# Patient Record
Sex: Female | Born: 1955
Health system: Southern US, Community
[De-identification: ages and names within clinical notes are randomized; demographics above are authoritative.]

## PROBLEM LIST (undated history)

## (undated) DIAGNOSIS — R569 Unspecified convulsions: Secondary | ICD-10-CM

## (undated) DIAGNOSIS — I214 Non-ST elevation (NSTEMI) myocardial infarction: Secondary | ICD-10-CM

## (undated) DIAGNOSIS — E7800 Pure hypercholesterolemia, unspecified: Secondary | ICD-10-CM

## (undated) DIAGNOSIS — I340 Nonrheumatic mitral (valve) insufficiency: Secondary | ICD-10-CM

## (undated) DIAGNOSIS — J449 Chronic obstructive pulmonary disease, unspecified: Secondary | ICD-10-CM

## (undated) DIAGNOSIS — F32A Depression, unspecified: Secondary | ICD-10-CM

## (undated) DIAGNOSIS — I5189 Other ill-defined heart diseases: Secondary | ICD-10-CM

## (undated) DIAGNOSIS — I639 Cerebral infarction, unspecified: Secondary | ICD-10-CM

## (undated) DIAGNOSIS — I5042 Chronic combined systolic (congestive) and diastolic (congestive) heart failure: Secondary | ICD-10-CM

## (undated) DIAGNOSIS — F329 Major depressive disorder, single episode, unspecified: Secondary | ICD-10-CM

## (undated) DIAGNOSIS — I071 Rheumatic tricuspid insufficiency: Secondary | ICD-10-CM

## (undated) DIAGNOSIS — I480 Paroxysmal atrial fibrillation: Secondary | ICD-10-CM

## (undated) DIAGNOSIS — J9611 Chronic respiratory failure with hypoxia: Secondary | ICD-10-CM

## (undated) DIAGNOSIS — I10 Essential (primary) hypertension: Secondary | ICD-10-CM

## (undated) DIAGNOSIS — Z972 Presence of dental prosthetic device (complete) (partial): Secondary | ICD-10-CM

## (undated) DIAGNOSIS — G4730 Sleep apnea, unspecified: Secondary | ICD-10-CM

## (undated) DIAGNOSIS — H401190 Primary open-angle glaucoma, unspecified eye, stage unspecified: Secondary | ICD-10-CM

## (undated) DIAGNOSIS — I428 Other cardiomyopathies: Secondary | ICD-10-CM

## (undated) DIAGNOSIS — E119 Type 2 diabetes mellitus without complications: Secondary | ICD-10-CM

## (undated) HISTORY — DX: Chronic combined systolic (congestive) and diastolic (congestive) heart failure: I50.42

## (undated) HISTORY — PX: ABDOMINAL HYSTERECTOMY: SHX81

## (undated) HISTORY — DX: Other cardiomyopathies: I42.8

## (undated) HISTORY — DX: Rheumatic tricuspid insufficiency: I07.1

## (undated) HISTORY — DX: Major depressive disorder, single episode, unspecified: F32.9

## (undated) HISTORY — DX: Depression, unspecified: F32.A

## (undated) HISTORY — DX: Cerebral infarction, unspecified: I63.9

## (undated) HISTORY — DX: Non-ST elevation (NSTEMI) myocardial infarction: I21.4

## (undated) HISTORY — DX: Morbid (severe) obesity due to excess calories: E66.01

## (undated) HISTORY — PX: REPLACEMENT TOTAL KNEE: SUR1224

---

## 2015-01-22 DIAGNOSIS — M159 Polyosteoarthritis, unspecified: Secondary | ICD-10-CM | POA: Insufficient documentation

## 2015-01-22 DIAGNOSIS — E1169 Type 2 diabetes mellitus with other specified complication: Secondary | ICD-10-CM | POA: Insufficient documentation

## 2015-01-22 DIAGNOSIS — M150 Primary generalized (osteo)arthritis: Secondary | ICD-10-CM | POA: Insufficient documentation

## 2015-01-22 DIAGNOSIS — G40309 Generalized idiopathic epilepsy and epileptic syndromes, not intractable, without status epilepticus: Secondary | ICD-10-CM | POA: Insufficient documentation

## 2015-01-22 DIAGNOSIS — E785 Hyperlipidemia, unspecified: Secondary | ICD-10-CM

## 2015-01-22 DIAGNOSIS — E1159 Type 2 diabetes mellitus with other circulatory complications: Secondary | ICD-10-CM | POA: Insufficient documentation

## 2015-01-22 DIAGNOSIS — H9193 Unspecified hearing loss, bilateral: Secondary | ICD-10-CM | POA: Insufficient documentation

## 2015-01-22 DIAGNOSIS — F332 Major depressive disorder, recurrent severe without psychotic features: Secondary | ICD-10-CM | POA: Insufficient documentation

## 2015-01-22 DIAGNOSIS — E119 Type 2 diabetes mellitus without complications: Secondary | ICD-10-CM | POA: Insufficient documentation

## 2015-01-22 DIAGNOSIS — I152 Hypertension secondary to endocrine disorders: Secondary | ICD-10-CM | POA: Insufficient documentation

## 2015-01-22 DIAGNOSIS — I10 Essential (primary) hypertension: Secondary | ICD-10-CM

## 2015-03-12 DIAGNOSIS — E669 Obesity, unspecified: Secondary | ICD-10-CM | POA: Insufficient documentation

## 2015-05-30 DIAGNOSIS — G4733 Obstructive sleep apnea (adult) (pediatric): Secondary | ICD-10-CM | POA: Insufficient documentation

## 2015-05-30 DIAGNOSIS — Z9989 Dependence on other enabling machines and devices: Secondary | ICD-10-CM

## 2015-05-30 DIAGNOSIS — R0902 Hypoxemia: Secondary | ICD-10-CM | POA: Insufficient documentation

## 2015-05-30 DIAGNOSIS — J449 Chronic obstructive pulmonary disease, unspecified: Secondary | ICD-10-CM | POA: Insufficient documentation

## 2015-06-09 DIAGNOSIS — H532 Diplopia: Secondary | ICD-10-CM | POA: Insufficient documentation

## 2015-06-26 DIAGNOSIS — I639 Cerebral infarction, unspecified: Secondary | ICD-10-CM | POA: Insufficient documentation

## 2015-10-17 DIAGNOSIS — I480 Paroxysmal atrial fibrillation: Secondary | ICD-10-CM | POA: Insufficient documentation

## 2016-01-05 DIAGNOSIS — R911 Solitary pulmonary nodule: Secondary | ICD-10-CM | POA: Insufficient documentation

## 2016-03-12 DIAGNOSIS — Z87898 Personal history of other specified conditions: Secondary | ICD-10-CM | POA: Insufficient documentation

## 2016-03-12 DIAGNOSIS — Z8673 Personal history of transient ischemic attack (TIA), and cerebral infarction without residual deficits: Secondary | ICD-10-CM | POA: Insufficient documentation

## 2016-03-12 DIAGNOSIS — J441 Chronic obstructive pulmonary disease with (acute) exacerbation: Secondary | ICD-10-CM | POA: Insufficient documentation

## 2016-07-29 ENCOUNTER — Encounter: Payer: Self-pay | Admitting: Emergency Medicine

## 2016-07-29 ENCOUNTER — Emergency Department
Admission: EM | Admit: 2016-07-29 | Discharge: 2016-07-29 | Disposition: A | Discharge status: Home / Self Care | Payer: Medicare HMO | Attending: Emergency Medicine | Admitting: Emergency Medicine

## 2016-07-29 DIAGNOSIS — M545 Low back pain, unspecified: Secondary | ICD-10-CM

## 2016-07-29 DIAGNOSIS — Z79899 Other long term (current) drug therapy: Secondary | ICD-10-CM | POA: Insufficient documentation

## 2016-07-29 DIAGNOSIS — E119 Type 2 diabetes mellitus without complications: Secondary | ICD-10-CM | POA: Insufficient documentation

## 2016-07-29 DIAGNOSIS — I10 Essential (primary) hypertension: Secondary | ICD-10-CM | POA: Insufficient documentation

## 2016-07-29 DIAGNOSIS — Z7901 Long term (current) use of anticoagulants: Secondary | ICD-10-CM | POA: Insufficient documentation

## 2016-07-29 DIAGNOSIS — Z7982 Long term (current) use of aspirin: Secondary | ICD-10-CM | POA: Insufficient documentation

## 2016-07-29 DIAGNOSIS — Z7984 Long term (current) use of oral hypoglycemic drugs: Secondary | ICD-10-CM | POA: Diagnosis not present

## 2016-07-29 HISTORY — DX: Essential (primary) hypertension: I10

## 2016-07-29 HISTORY — DX: Pure hypercholesterolemia, unspecified: E78.00

## 2016-07-29 HISTORY — DX: Type 2 diabetes mellitus without complications: E11.9

## 2016-07-29 HISTORY — DX: Unspecified convulsions: R56.9

## 2016-07-29 LAB — URINALYSIS, COMPLETE (UACMP) WITH MICROSCOPIC
BACTERIA UA: NONE SEEN
BILIRUBIN URINE: NEGATIVE
Glucose, UA: NEGATIVE mg/dL
Hgb urine dipstick: NEGATIVE
KETONES UR: NEGATIVE mg/dL
LEUKOCYTES UA: NEGATIVE
Nitrite: NEGATIVE
PH: 6 (ref 5.0–8.0)
PROTEIN: NEGATIVE mg/dL
Specific Gravity, Urine: 1.019 (ref 1.005–1.030)

## 2016-07-29 MED ORDER — DIAZEPAM 2 MG PO TABS: 2.0000 mg | ORAL_TABLET | Freq: Three times a day (TID) | ORAL | 0 refills | Status: DC | PRN

## 2016-07-29 MED ORDER — DIAZEPAM 5 MG PO TABS
5.0000 mg | ORAL_TABLET | Freq: Once | ORAL | Status: AC
  Administered 2016-07-29: 5 mg via ORAL
  Filled 2016-07-29: qty 1

## 2016-07-29 MED ORDER — HYDROCODONE-ACETAMINOPHEN 5-325 MG PO TABS
1.0000 | ORAL_TABLET | Freq: Once | ORAL | Status: AC
  Administered 2016-07-29: 1 via ORAL
  Filled 2016-07-29: qty 1

## 2016-07-29 MED ORDER — HYDROCODONE-ACETAMINOPHEN 5-325 MG PO TABS: 1.0000 | ORAL_TABLET | ORAL | 0 refills | Status: DC | PRN

## 2016-07-29 NOTE — ED Triage Notes (Signed)
Pain low right back sharp for 2 days.  Worse with movement.

## 2016-07-29 NOTE — Discharge Instructions (Signed)
Continue diazepam 2 mg 1 every 8 hours as needed for muscle spasms. Norco one every 4 hours as needed for moderate pain. Moist heat or ice to your back as needed for comfort. Make an appointment with your primary care doctor in [facility] if any continued problems.

## 2016-07-29 NOTE — ED Provider Notes (Signed)
[facility] Emergency Department Provider Note   ____________________________________________   First MD Initiated Contact with Patient 07/29/16 1200     (approximate)  I have reviewed the triage vital signs and the nursing notes.   HISTORY  Chief Complaint Back Pain    HPI Brenda Wu is a 60 y.o. female is her complaint of sharp right lower back pain for the last 2 days. Patient states that pain is worse with movement. She is unaware of any actual injury. Patient denies any urinary systems or history of kidney stone. She states nothing she has done has helped with her pain. She denies any paresthesias into her lower extremities. Patient rates her pain as a 10 over 10.   Past Medical History:  Diagnosis Date  . Diabetes mellitus without complication (HCC)   . Hypercholesteremia   . Hypertension   . Seizures (HCC)     There are no active problems to display for this patient.   History reviewed. No pertinent surgical history.  Prior to Admission medications   Medication Sig Start Date End Date Taking? Authorizing Provider  aspirin 325 MG EC tablet Take 325 mg by mouth daily.   Yes [provider]  atorvastatin (LIPITOR) 40 MG tablet Take 40 mg by mouth daily.   Yes [provider]  diltiazem (CARDIZEM SR) 60 MG 12 hr capsule Take 60 mg by mouth 2 (two) times daily.   Yes [provider]  glipiZIDE (GLUCOTROL) 5 MG tablet Take by mouth daily before breakfast.   Yes [provider]  hydrochlorothiazide (HYDRODIURIL) 25 MG tablet Take 25 mg by mouth daily.   Yes [provider]  lamoTRIgine (LAMICTAL) 200 MG tablet Take 200 mg by mouth daily.   Yes [provider]  losartan (COZAAR) 50 MG tablet Take 50 mg by mouth daily.   Yes [provider]  metFORMIN (GLUCOPHAGE) 1000 MG tablet Take 1,000 mg by mouth 2 (two) times daily with a meal.   Yes [provider]  rivaroxaban  (XARELTO) 20 MG TABS tablet Take 20 mg by mouth daily with supper.   Yes [provider]  sitaGLIPtin (JANUVIA) 50 MG tablet Take 50 mg by mouth daily.   Yes [provider]  tiotropium (SPIRIVA) 18 MCG inhalation capsule Place 18 mcg into inhaler and inhale daily.   Yes [provider]  topiramate (TOPAMAX) 200 MG tablet Take 200 mg by mouth 2 (two) times daily.   Yes [provider]  topiramate (TOPAMAX) 50 MG tablet Take 50 mg by mouth 2 (two) times daily.   Yes [provider]  diazepam (VALIUM) 2 MG tablet Take 1 tablet (2 mg total) by mouth every 8 (eight) hours as needed for muscle spasms. 07/29/16   Tommi Rumps, PA-C  HYDROcodone-acetaminophen (NORCO/VICODIN) 5-325 MG tablet Take 1 tablet by mouth every 4 (four) hours as needed for moderate pain. 07/29/16   Tommi Rumps, PA-C    Allergies Patient has no known allergies.  No family history on file.  Social History Social History  Substance Use Topics  . Smoking status: Never Smoker  . Smokeless tobacco: Never Used  . Alcohol use No    Review of Systems  Constitutional: No fever/chills Cardiovascular: Denies chest pain. Respiratory: Denies shortness of breath. Gastrointestinal: No abdominal pain.  No nausea, no vomiting. Genitourinary: Negative for dysuria. Musculoskeletal: Positive for right-sided low back pain. Skin: Negative for rash. Neurological: Negative for headaches, focal weakness or numbness.  ____________________________________________   PHYSICAL EXAM:  VITAL SIGNS: ED Triage Vitals  Enc Vitals Group     BP 07/29/16 1132 (!) 141/63     Pulse Rate 07/29/16 1132 95     Resp 07/29/16 1132 20     Temp 07/29/16 1132 98 F (36.7 C)     Temp Source 07/29/16 1132 Oral     SpO2 07/29/16 1132 95 %     Weight 07/29/16 1135 234 lb (106.1 kg)     Height 07/29/16 1135 5\' 3"  (1.6 m)     Head Circumference --      Peak Flow --      Pain Score 07/29/16 1138 10       Pain Loc --      Pain Edu? --      Excl. in GC? --     Constitutional: Alert and oriented. Well appearing and in no acute distress. Eyes: Conjunctivae are normal. PERRL. EOMI. Head: Atraumatic. Neck: No stridor.   Cardiovascular: Normal rate, regular rhythm. Grossly normal heart sounds.  Good peripheral circulation. Respiratory: Normal respiratory effort.  No retractions. Lungs CTAB. Gastrointestinal: Soft and nontender. No distention. Bowel sounds normoactive 4 quadrants. Musculoskeletal: On examination of the back there is no gross deformity noted. There is tenderness on palpation of the right lower lateral muscles. Range of motion is restricted secondary to patient's discomfort. There is no point tenderness on palpation of the lumbar spine. No CVA tenderness is appreciated. Neurologic:  Normal speech and language. No gross focal neurologic deficits are appreciated. Skin:  Skin is warm, dry and intact. No rash noted. Psychiatric: Mood and affect are normal. Speech and behavior are normal.  ____________________________________________   LABS (all labs ordered are listed, but only abnormal results are displayed)  Labs Reviewed  URINALYSIS, COMPLETE (UACMP) WITH MICROSCOPIC - Abnormal; Notable for the following:       Result Value   Color, Urine YELLOW (*)    APPearance CLEAR (*)    Squamous Epithelial / LPF 6-30 (*)    All other components within normal limits    RADIOLOGY  No results found.  ____________________________________________   PROCEDURES  Procedure(s) performed: None  Procedures  Critical Care performed: No  ____________________________________________   INITIAL IMPRESSION / ASSESSMENT AND PLAN / ED COURSE  Pertinent labs & imaging results that were available during my care of the patient were reviewed by me and considered in my medical decision making (see chart for details).  ----------------------------------------- 1:12 PM on  07/29/2016 ----------------------------------------- Patient has improved on medication given in the ED which included Valium 5 mg by mouth and Norco. Patient was reassured that urinalysis was normal. She is also reassured that in the area that she is hurting is muscle skeletal and that is the muscle spasms that is increasing her pain with movement. Patient was improved prior to discharge and at the time of discharge her pain was down to a 6/10.      ____________________________________________   FINAL CLINICAL IMPRESSION(S) / ED DIAGNOSES  Final diagnoses:  Acute right-sided low back pain without sciatica      NEW MEDICATIONS STARTED DURING THIS VISIT:  Discharge Medication List as of 07/29/2016  1:16 PM    START taking these medications   Details  diazepam (VALIUM) 2 MG tablet Take 1 tablet (2 mg total) by mouth every 8 (eight) hours as needed for muscle spasms., Starting Thu 07/29/2016, Print    HYDROcodone-acetaminophen (NORCO/VICODIN) 5-325 MG tablet Take 1 tablet by mouth every  4 (four) hours as needed for moderate pain., Starting Thu 07/29/2016, Print         Note:  This document was prepared using Dragon voice recognition software and may include unintentional dictation errors.    Tommi Rumps, PA-C 07/29/16 1551    Loleta Rose, MD 08/01/16 [phone]

## 2016-11-01 ENCOUNTER — Emergency Department: Payer: Medicare HMO

## 2016-11-01 ENCOUNTER — Inpatient Hospital Stay
Admission: EM | Admit: 2016-11-01 | Discharge: 2016-11-04 | DRG: 190 | Disposition: A | Discharge status: Home / Self Care | Payer: Medicare HMO | Attending: Specialist | Admitting: Specialist

## 2016-11-01 ENCOUNTER — Encounter: Payer: Self-pay | Admitting: *Deleted

## 2016-11-01 DIAGNOSIS — I10 Essential (primary) hypertension: Secondary | ICD-10-CM | POA: Diagnosis present

## 2016-11-01 DIAGNOSIS — N179 Acute kidney failure, unspecified: Secondary | ICD-10-CM | POA: Diagnosis present

## 2016-11-01 DIAGNOSIS — Z87891 Personal history of nicotine dependence: Secondary | ICD-10-CM

## 2016-11-01 DIAGNOSIS — J441 Chronic obstructive pulmonary disease with (acute) exacerbation: Principal | ICD-10-CM | POA: Diagnosis present

## 2016-11-01 DIAGNOSIS — E119 Type 2 diabetes mellitus without complications: Secondary | ICD-10-CM | POA: Diagnosis present

## 2016-11-01 DIAGNOSIS — E7800 Pure hypercholesterolemia, unspecified: Secondary | ICD-10-CM | POA: Diagnosis present

## 2016-11-01 DIAGNOSIS — K219 Gastro-esophageal reflux disease without esophagitis: Secondary | ICD-10-CM | POA: Diagnosis present

## 2016-11-01 DIAGNOSIS — J9602 Acute respiratory failure with hypercapnia: Secondary | ICD-10-CM

## 2016-11-01 DIAGNOSIS — R0600 Dyspnea, unspecified: Secondary | ICD-10-CM

## 2016-11-01 DIAGNOSIS — J440 Chronic obstructive pulmonary disease with acute lower respiratory infection: Secondary | ICD-10-CM | POA: Diagnosis present

## 2016-11-01 DIAGNOSIS — R569 Unspecified convulsions: Secondary | ICD-10-CM | POA: Diagnosis present

## 2016-11-01 DIAGNOSIS — Z7984 Long term (current) use of oral hypoglycemic drugs: Secondary | ICD-10-CM | POA: Diagnosis not present

## 2016-11-01 DIAGNOSIS — J9621 Acute and chronic respiratory failure with hypoxia: Secondary | ICD-10-CM | POA: Diagnosis present

## 2016-11-01 DIAGNOSIS — E785 Hyperlipidemia, unspecified: Secondary | ICD-10-CM | POA: Diagnosis present

## 2016-11-01 DIAGNOSIS — J9622 Acute and chronic respiratory failure with hypercapnia: Secondary | ICD-10-CM | POA: Diagnosis present

## 2016-11-01 DIAGNOSIS — E872 Acidosis: Secondary | ICD-10-CM | POA: Diagnosis present

## 2016-11-01 DIAGNOSIS — J189 Pneumonia, unspecified organism: Secondary | ICD-10-CM

## 2016-11-01 DIAGNOSIS — J209 Acute bronchitis, unspecified: Secondary | ICD-10-CM | POA: Diagnosis present

## 2016-11-01 DIAGNOSIS — Z9981 Dependence on supplemental oxygen: Secondary | ICD-10-CM | POA: Diagnosis not present

## 2016-11-01 LAB — BASIC METABOLIC PANEL
ANION GAP: 7 (ref 5–15)
BUN: 15 mg/dL (ref 6–20)
CHLORIDE: 104 mmol/L (ref 101–111)
CO2: 31 mmol/L (ref 22–32)
Calcium: 9.5 mg/dL (ref 8.9–10.3)
Creatinine, Ser: 1.16 mg/dL — ABNORMAL HIGH (ref 0.44–1.00)
GFR calc non Af Amer: 50 mL/min — ABNORMAL LOW (ref >60)
GFR, EST AFRICAN AMERICAN: 58 mL/min — AB (ref >60)
Glucose, Bld: 175 mg/dL — ABNORMAL HIGH (ref 65–99)
Potassium: 3.7 mmol/L (ref 3.5–5.1)
SODIUM: 142 mmol/L (ref 135–145)

## 2016-11-01 LAB — CBC
HCT: 52.6 % — ABNORMAL HIGH (ref 35.0–47.0)
HEMOGLOBIN: 16.7 g/dL — AB (ref 12.0–16.0)
MCH: 29.1 pg (ref 26.0–34.0)
MCHC: 31.7 g/dL — ABNORMAL LOW (ref 32.0–36.0)
MCV: 91.7 fL (ref 80.0–100.0)
Platelets: 170 10*3/uL (ref 150–440)
RBC: 5.74 MIL/uL — AB (ref 3.80–5.20)
RDW: 14.8 % — ABNORMAL HIGH (ref 11.5–14.5)
WBC: 8.4 10*3/uL (ref 3.6–11.0)

## 2016-11-01 LAB — GLUCOSE, CAPILLARY
GLUCOSE-CAPILLARY: 320 mg/dL — AB (ref 65–99)
Glucose-Capillary: 202 mg/dL — ABNORMAL HIGH (ref 65–99)
Glucose-Capillary: 347 mg/dL — ABNORMAL HIGH (ref 65–99)

## 2016-11-01 LAB — BLOOD GAS, VENOUS
Acid-Base Excess: 4 mmol/L — ABNORMAL HIGH (ref 0.0–2.0)
BICARBONATE: 35.6 mmol/L — AB (ref 20.0–28.0)
O2 SAT: 46.5 %
PCO2 VEN: 85 mmHg — AB (ref 44.0–60.0)
PO2 VEN: 31 mmHg — AB (ref 32.0–45.0)
Patient temperature: 37
pH, Ven: 7.23 — ABNORMAL LOW (ref 7.250–7.430)

## 2016-11-01 LAB — MRSA PCR SCREENING: MRSA by PCR: NEGATIVE

## 2016-11-01 LAB — PROCALCITONIN

## 2016-11-01 LAB — TROPONIN I

## 2016-11-01 MED ORDER — SODIUM CHLORIDE 0.9% FLUSH
3.0000 mL | Freq: Two times a day (BID) | INTRAVENOUS | Status: DC
  Administered 2016-11-01 – 2016-11-03 (×4): 3 mL via INTRAVENOUS

## 2016-11-01 MED ORDER — LAMOTRIGINE 25 MG PO TABS
200.0000 mg | ORAL_TABLET | Freq: Two times a day (BID) | ORAL | Status: DC
  Administered 2016-11-01 – 2016-11-04 (×6): 200 mg via ORAL
  Filled 2016-11-01 (×6): qty 8

## 2016-11-01 MED ORDER — INSULIN ASPART 100 UNIT/ML ~~LOC~~ SOLN
0.0000 [IU] | Freq: Three times a day (TID) | SUBCUTANEOUS | Status: DC
  Administered 2016-11-02 (×2): 4 [IU] via SUBCUTANEOUS
  Administered 2016-11-03: 15 [IU] via SUBCUTANEOUS
  Administered 2016-11-03 – 2016-11-04 (×2): 4 [IU] via SUBCUTANEOUS
  Filled 2016-11-01 (×5): qty 1

## 2016-11-01 MED ORDER — METHYLPREDNISOLONE SODIUM SUCC 125 MG IJ SOLR
125.0000 mg | Freq: Once | INTRAMUSCULAR | Status: AC
  Administered 2016-11-01: 125 mg via INTRAVENOUS
  Filled 2016-11-01: qty 2

## 2016-11-01 MED ORDER — SODIUM CHLORIDE 0.9% FLUSH: 3.0000 mL | INTRAVENOUS | Status: DC | PRN

## 2016-11-01 MED ORDER — DEXTROSE 5 % IV SOLN
1.0000 g | INTRAVENOUS | Status: DC
  Filled 2016-11-01: qty 10

## 2016-11-01 MED ORDER — NIACIN ER 500 MG PO CPCR
500.0000 mg | ORAL_CAPSULE | Freq: Every day | ORAL | Status: DC
  Administered 2016-11-02 – 2016-11-04 (×3): 500 mg via ORAL
  Filled 2016-11-01 (×3): qty 1

## 2016-11-01 MED ORDER — INSULIN ASPART 100 UNIT/ML ~~LOC~~ SOLN
0.0000 [IU] | Freq: Every day | SUBCUTANEOUS | Status: DC
  Administered 2016-11-01: 4 [IU] via SUBCUTANEOUS
  Administered 2016-11-02: 2 [IU] via SUBCUTANEOUS
  Filled 2016-11-01 (×2): qty 1

## 2016-11-01 MED ORDER — ACETAMINOPHEN 650 MG RE SUPP: 650.0000 mg | Freq: Four times a day (QID) | RECTAL | Status: DC | PRN

## 2016-11-01 MED ORDER — ACETAMINOPHEN 325 MG PO TABS
650.0000 mg | ORAL_TABLET | Freq: Four times a day (QID) | ORAL | Status: DC | PRN
  Administered 2016-11-01 – 2016-11-03 (×3): 650 mg via ORAL
  Filled 2016-11-01 (×3): qty 2

## 2016-11-01 MED ORDER — ALBUTEROL SULFATE (2.5 MG/3ML) 0.083% IN NEBU
5.0000 mg | INHALATION_SOLUTION | Freq: Once | RESPIRATORY_TRACT | Status: AC
  Administered 2016-11-01: 5 mg via RESPIRATORY_TRACT
  Filled 2016-11-01: qty 6

## 2016-11-01 MED ORDER — ADULT MULTIVITAMIN W/MINERALS CH
1.0000 | ORAL_TABLET | Freq: Every day | ORAL | Status: DC
  Administered 2016-11-02 – 2016-11-04 (×3): 1 via ORAL
  Filled 2016-11-01 (×5): qty 1

## 2016-11-01 MED ORDER — TOPIRAMATE 100 MG PO TABS
200.0000 mg | ORAL_TABLET | Freq: Two times a day (BID) | ORAL | Status: DC
  Administered 2016-11-01 – 2016-11-04 (×6): 200 mg via ORAL
  Filled 2016-11-01 (×6): qty 2

## 2016-11-01 MED ORDER — METHYLPREDNISOLONE SODIUM SUCC 125 MG IJ SOLR
80.0000 mg | Freq: Four times a day (QID) | INTRAMUSCULAR | Status: DC
  Administered 2016-11-01: 80 mg via INTRAVENOUS
  Filled 2016-11-01: qty 2

## 2016-11-01 MED ORDER — BUDESONIDE 0.25 MG/2ML IN SUSP
0.2500 mg | Freq: Two times a day (BID) | RESPIRATORY_TRACT | Status: DC
  Administered 2016-11-01 – 2016-11-02 (×2): 0.25 mg via RESPIRATORY_TRACT
  Filled 2016-11-01 (×2): qty 2

## 2016-11-01 MED ORDER — POTASSIUM 99 MG PO TABS: 99.0000 mg | ORAL_TABLET | Freq: Every day | ORAL | Status: DC

## 2016-11-01 MED ORDER — SODIUM CHLORIDE 0.9 % IV BOLUS (SEPSIS)
1000.0000 mL | Freq: Once | INTRAVENOUS | Status: AC
  Administered 2016-11-01: 1000 mL via INTRAVENOUS

## 2016-11-01 MED ORDER — AZITHROMYCIN 500 MG IV SOLR
500.0000 mg | INTRAVENOUS | Status: DC
  Filled 2016-11-01: qty 500

## 2016-11-01 MED ORDER — LINAGLIPTIN 5 MG PO TABS
5.0000 mg | ORAL_TABLET | Freq: Every day | ORAL | Status: DC
  Administered 2016-11-02 – 2016-11-04 (×3): 5 mg via ORAL
  Filled 2016-11-01 (×3): qty 1

## 2016-11-01 MED ORDER — PANTOPRAZOLE SODIUM 40 MG IV SOLR
40.0000 mg | INTRAVENOUS | Status: DC
  Administered 2016-11-01: 40 mg via INTRAVENOUS
  Filled 2016-11-01: qty 40

## 2016-11-01 MED ORDER — ONDANSETRON HCL 4 MG PO TABS: 4.0000 mg | ORAL_TABLET | Freq: Four times a day (QID) | ORAL | Status: DC | PRN

## 2016-11-01 MED ORDER — DEXTROSE 5 % IV SOLN
500.0000 mg | Freq: Once | INTRAVENOUS | Status: AC
  Administered 2016-11-01: 500 mg via INTRAVENOUS
  Filled 2016-11-01: qty 500

## 2016-11-01 MED ORDER — ATORVASTATIN CALCIUM 20 MG PO TABS
40.0000 mg | ORAL_TABLET | Freq: Every day | ORAL | Status: DC
  Administered 2016-11-01 – 2016-11-04 (×4): 40 mg via ORAL
  Filled 2016-11-01 (×4): qty 2

## 2016-11-01 MED ORDER — ENOXAPARIN SODIUM 40 MG/0.4ML ~~LOC~~ SOLN
40.0000 mg | SUBCUTANEOUS | Status: DC
  Administered 2016-11-01 – 2016-11-03 (×3): 40 mg via SUBCUTANEOUS
  Filled 2016-11-01 (×3): qty 0.4

## 2016-11-01 MED ORDER — GLIPIZIDE 5 MG PO TABS
5.0000 mg | ORAL_TABLET | Freq: Two times a day (BID) | ORAL | Status: DC
  Administered 2016-11-02 – 2016-11-04 (×5): 5 mg via ORAL
  Filled 2016-11-01 (×6): qty 1

## 2016-11-01 MED ORDER — CEFTRIAXONE SODIUM 1 G IJ SOLR
1.0000 g | Freq: Once | INTRAMUSCULAR | Status: AC
  Administered 2016-11-01: 1 g via INTRAVENOUS
  Filled 2016-11-01: qty 10

## 2016-11-01 MED ORDER — SODIUM CHLORIDE 0.9 % IV SOLN: 250.0000 mL | INTRAVENOUS | Status: DC | PRN

## 2016-11-01 MED ORDER — ONDANSETRON HCL 4 MG/2ML IJ SOLN: 4.0000 mg | Freq: Four times a day (QID) | INTRAMUSCULAR | Status: DC | PRN

## 2016-11-01 MED ORDER — METHYLPREDNISOLONE SODIUM SUCC 40 MG IJ SOLR
40.0000 mg | Freq: Two times a day (BID) | INTRAMUSCULAR | Status: AC
  Administered 2016-11-02 – 2016-11-03 (×4): 40 mg via INTRAVENOUS
  Filled 2016-11-01 (×4): qty 1

## 2016-11-01 MED ORDER — BUDESONIDE 0.25 MG/2ML IN SUSP: 0.2500 mg | Freq: Two times a day (BID) | RESPIRATORY_TRACT | Status: DC

## 2016-11-01 MED ORDER — FERROUS SULFATE 325 (65 FE) MG PO TABS
325.0000 mg | ORAL_TABLET | Freq: Every day | ORAL | Status: DC
  Administered 2016-11-02 – 2016-11-04 (×3): 325 mg via ORAL
  Filled 2016-11-01 (×3): qty 1

## 2016-11-01 MED ORDER — IPRATROPIUM-ALBUTEROL 0.5-2.5 (3) MG/3ML IN SOLN
3.0000 mL | Freq: Four times a day (QID) | RESPIRATORY_TRACT | Status: DC
  Administered 2016-11-01 – 2016-11-04 (×11): 3 mL via RESPIRATORY_TRACT
  Filled 2016-11-01 (×11): qty 3

## 2016-11-01 MED ORDER — DILTIAZEM HCL ER 60 MG PO CP12
60.0000 mg | ORAL_CAPSULE | Freq: Every day | ORAL | Status: DC
  Administered 2016-11-02 – 2016-11-04 (×3): 60 mg via ORAL
  Filled 2016-11-01 (×3): qty 1

## 2016-11-01 MED ORDER — IPRATROPIUM-ALBUTEROL 0.5-2.5 (3) MG/3ML IN SOLN
3.0000 mL | Freq: Once | RESPIRATORY_TRACT | Status: AC
  Administered 2016-11-01: 3 mL via RESPIRATORY_TRACT
  Filled 2016-11-01: qty 3

## 2016-11-01 MED ORDER — LOSARTAN POTASSIUM 50 MG PO TABS
50.0000 mg | ORAL_TABLET | Freq: Every day | ORAL | Status: DC
  Administered 2016-11-02 – 2016-11-04 (×3): 50 mg via ORAL
  Filled 2016-11-01 (×3): qty 1

## 2016-11-01 MED ORDER — HYDROCHLOROTHIAZIDE 25 MG PO TABS
25.0000 mg | ORAL_TABLET | Freq: Every day | ORAL | Status: DC
  Administered 2016-11-02 – 2016-11-04 (×3): 25 mg via ORAL
  Filled 2016-11-01 (×3): qty 1

## 2016-11-01 NOTE — ED Notes (Signed)
Pt returned from xray via stretcher.

## 2016-11-01 NOTE — ED Notes (Signed)
Pt resting in bed with eyes closed, lights dimmed for comfort. Pt remains on BiPap at this time. Will continue to monitor for further patient needs. Attempted to call report x 1.

## 2016-11-01 NOTE — Consult Note (Signed)
Name: Brenda Wu MRN: [id number] DOB: 09/25/1955    ADMISSION DATE:  11/01/2016 CONSULTATION DATE: 11/01/2016  REFERRING MD : Dr. Letitia Libra   CHIEF COMPLAINT:   Shortness of Breath   BRIEF PATIENT DESCRIPTION:  61 yo female admitted 09/10 with acute on chronic hypercapnic respiratory failure secondary to AECOPD and acute bronchitis requiring continuous Bipap   SIGNIFICANT EVENTS  09/10-Pt admitted to the Stepdown Unit   STUDIES:  None   HISTORY OF PRESENT ILLNESS:   This is a 61 yo female with a PMH of Seizures, HTN, Hypercholesteremia, COPD, Former Smoker, Home O2 at 2L, CPAP qhs, and Diabetes Mellitus.  She presented to [facility] 09/10 with c/o productive cough, fever, and shortness of breath onset of symptoms 3 days prior to presentation to the ER.  According to the pt she had been using her nebulizer treatments and other pulmonary medications without relief of symptoms prompting ER visit.  In the ER venous gas results revealed pH 7.23, CO2 85, and CXR concerning for possible pneumonia.  She remained short of breath in the ER, therefore she was placed on continuous Bipap.  She was subsequently admitted to the [facility] Unit for further workup and treatment PCCM consulted   PAST MEDICAL HISTORY :   has a past medical history of Diabetes mellitus without complication (HCC); Hypercholesteremia; Hypertension; and Seizures (HCC).  has no past surgical history on file. Prior to Admission medications   Medication Sig Start Date End Date Taking? Authorizing Provider  albuterol (PROVENTIL HFA;VENTOLIN HFA) 108 (90 Base) MCG/ACT inhaler Inhale 2 puffs into the lungs 4 (four) times daily as needed. 01/05/16  Yes [provider]  atorvastatin (LIPITOR) 40 MG tablet Take 40 mg by mouth daily.   Yes [provider]  budesonide-formoterol (SYMBICORT) 160-4.5 MCG/ACT inhaler Inhale 2 puffs into the lungs 2 (two) times daily. 05/22/15  Yes [provider]  diltiazem  (CARDIZEM SR) 60 MG 12 hr capsule Take 60 mg by mouth daily.    Yes [provider]  ferrous sulfate 325 (65 FE) MG tablet Take 325 mg by mouth daily with breakfast.   Yes [provider]  glipiZIDE (GLUCOTROL) 5 MG tablet Take by mouth 2 (two) times daily before a meal.    Yes [provider]  hydrochlorothiazide (HYDRODIURIL) 25 MG tablet Take 25 mg by mouth daily.   Yes [provider]  lamoTRIgine (LAMICTAL) 200 MG tablet Take 200 mg by mouth 2 (two) times daily.    Yes [provider]  losartan (COZAAR) 50 MG tablet Take 50 mg by mouth daily.   Yes [provider]  Multiple Vitamin (MULTI-VITAMINS) TABS Take 1 tablet by mouth daily.   Yes [provider]  niacin 500 MG CR capsule Take 500 mg by mouth daily. 08/15/15  Yes [provider]  Potassium 99 MG TABS Take 99 mg by mouth daily.   Yes [provider]  sitaGLIPtin (JANUVIA) 50 MG tablet Take 50 mg by mouth daily.   Yes [provider]  tiotropium (SPIRIVA) 18 MCG inhalation capsule Place 18 mcg into inhaler and inhale daily.   Yes [provider]  topiramate (TOPAMAX) 200 MG tablet Take 200 mg by mouth 2 (two) times daily.   Yes [provider]  diazepam (VALIUM) 2 MG tablet Take 1 tablet (2 mg total) by mouth every 8 (eight) hours as needed for muscle spasms. Patient not taking: Reported on 11/01/2016 07/29/16   Tommi Rumps, PA-C  HYDROcodone-acetaminophen (NORCO/VICODIN)  5-325 MG tablet Take 1 tablet by mouth every 4 (four) hours as needed for moderate pain. Patient not taking: Reported on 11/01/2016 07/29/16   Tommi Rumps, PA-C   No Known Allergies  FAMILY HISTORY:  family history is not on file. SOCIAL HISTORY:  reports that she has never smoked. She has never used smokeless tobacco. She reports that she does not drink alcohol.  REVIEW OF SYSTEMS:  Positives in BOLD  Constitutional: Negative for fever, chills, weight  loss, malaise/fatigue and diaphoresis.  HENT: Negative for hearing loss, ear pain, nosebleeds, congestion, sore throat, neck pain, tinnitus and ear discharge.   Eyes: Negative for blurred vision, double vision, photophobia, pain, discharge and redness.  Respiratory: cough, hemoptysis, sputum production, shortness of breath, wheezing and stridor.   Cardiovascular: Negative for chest pain, palpitations, orthopnea, claudication, leg swelling and PND.  Gastrointestinal: Negative for heartburn, nausea, vomiting, abdominal pain, diarrhea, constipation, blood in stool and melena.  Genitourinary: Negative for dysuria, urgency, frequency, hematuria and flank pain.  Musculoskeletal: Negative for myalgias, back pain, joint pain and falls.  Skin: Negative for itching and rash.  Neurological: Negative for dizziness, tingling, tremors, sensory change, speech change, focal weakness, seizures, loss of consciousness, weakness and headaches.  Endo/Heme/Allergies: Negative for environmental allergies and polydipsia. Does not bruise/bleed easily.  SUBJECTIVE:  Pt states her breathing has improved she would like something to eat   VITAL SIGNS: Temp:  [97.6 F (36.4 C)-98.3 F (36.8 C)] 97.6 F (36.4 C) (09/10 1942) Pulse Rate:  [73-85] 73 (09/10 1900) Resp:  [13-24] 15 (09/10 1900) BP: (103-122)/(66-80) 107/70 (09/10 1900) SpO2:  [89 %-98 %] 93 % (09/10 2018) Weight:  [104.3 kg (230 lb)] 104.3 kg (230 lb) (09/10 1136)  PHYSICAL EXAMINATION: General: well developed, well nourished obese AA female, NAD  Neuro: alert and oriented, follows commands  HEENT: supple, no JVD Cardiovascular: s1s2, RRR, no M/R/G Lungs: diminished throughout, even, non labored on nasal canula  Abdomen: +BS x4, obese, soft, non tender, non distended Musculoskeletal: normal tone, 1+ bilateral lower extremity edema Skin: intact no rashes or lesions   Recent Labs Lab 11/01/16 1137  NA 142  K 3.7  CL 104  CO2 31  BUN 15    CREATININE 1.16*  GLUCOSE 175*    Recent Labs Lab 11/01/16 1137  HGB 16.7*  HCT 52.6*  WBC 8.4  PLT 170   Dg Chest 2 View  Result Date: 11/01/2016 CLINICAL DATA:  Cough, shortness of breath, and fever for the past 3 days. The patient is on 2 L of oxygen at home. History of COPD, discontinue smoking 14 years ago. EXAM: CHEST  2 VIEW COMPARISON:  Chest x-ray of March 31, 2015 FINDINGS: The lungs are adequately inflated. The interstitial markings are mildly increased. There are coarse lung markings in both lower lobes. The heart and pulmonary vascularity are normal. The mediastinum is normal in width. There is calcification in the wall of the aortic arch. There is no pleural effusion. IMPRESSION: Patchy increased density at both bases greatest on the right worrisome for atelectasis or pneumonia. Underlying COPD. Followup PA and lateral chest X-ray is recommended in 3-4 weeks following trial of antibiotic therapy to ensure resolution and exclude underlying malignancy. Electronically Signed   By: Imoni Kohen  [place] M.D.   On: 11/01/2016 12:33    ASSESSMENT / PLAN: Acute on chronic hypercapnic respiratory failure secondary to acute bronchitis, AECOPD, and possible pneumonia  Acute renal failure Hx: Diabetes Mellitus, Seizures, HTN, Hypercholesteremia, and Former Smoker  P: Supplemental O2 to maintain O2 sats 88% to 92% Prn Bipap  Continue scheduled and prn bronchodilator therapy Will add nebulized steroids Wean IV steroids  Prn CXR Trend WBC and monitor fever curve Trend PCT Continue abx for now  Continuous telemetry monitoring  Continue outpatient atorvastatin, cardizem, hydrochlorothiazide, and losartan Continue lamictal and topamax Continue SSI, linagliptin, and glipizide  Lovenox for VTE prophylaxis  Trend CBC Trend BMP Monitor UOP  Sonda Rumble, AGNP  Pulmonary/Critical Care Pager [phone] (please enter [phone]) PCCM Consult Pager [phone] (please enter 7  digits)    Billy Fischer, MD PCCM service Mobile [phone] Pager [phone] 11/02/2016 1:53 PM

## 2016-11-01 NOTE — ED Notes (Signed)
Pt assisted to the bathroom by this RN. This RN attempted to discourage patient from getting out of bed due to [facility], pt refused. Pt states, "I can live without my oxygen for a few minutes". Pt ambulated to the bathroom with mild difficulty, noted to get [facility], pt assisted back to bed. Upon returning to bed, pt noted to be 67% on RA after ambulating. This RN explained that patient would no longer be able to get out of bed without her oxygen, or without assistance due to low O2 saturation.

## 2016-11-01 NOTE — ED Triage Notes (Signed)
Pt brought over [facility] for cough and SOB, states brown productive cough, states she is on 2L  at home for COPD, wheezing heard, KC reports o2 sat of 88% on RA

## 2016-11-01 NOTE — Progress Notes (Signed)
Pt did not want to wear Bipap tonight. Nurse notified. Will continue to monitor pt. No distress noted.

## 2016-11-01 NOTE — ED Notes (Signed)
This RN attempted x 2 for IV access, Amy C, RN attempted x 2 for IV access.

## 2016-11-01 NOTE — ED Provider Notes (Signed)
[facility] Emergency Department Provider Note  ____________________________________________  Time seen: Approximately 12:48 PM  I have reviewed the triage vital signs and the nursing notes.   HISTORY  Chief Complaint Shortness of Breath    HPI Brenda Wu is a 61 y.o. female who complains of productive cough and shortness of breath since yesterday. Been using her nebulizer and other pulmonary medications at home without relief. Denies fever chills or sweats, feels like pneumonia she's had in the past. Denies chest pain dizziness or syncope. Shortness of breath is worse with any ambulation, slightly better with rest. She uses 2 L oxygen at home at all times. Denies a history of heart failure.     Past Medical History:  Diagnosis Date  . Diabetes mellitus without complication (HCC)   . Hypercholesteremia   . Hypertension   . Seizures (HCC)      There are no active problems to display for this patient.    History reviewed. No pertinent surgical history.   Prior to Admission medications   Medication Sig Start Date End Date Taking? Authorizing Provider  albuterol (PROVENTIL HFA;VENTOLIN HFA) 108 (90 Base) MCG/ACT inhaler Inhale 2 puffs into the lungs 4 (four) times daily as needed. 01/05/16  Yes [provider]  atorvastatin (LIPITOR) 40 MG tablet Take 40 mg by mouth daily.   Yes [provider]  budesonide-formoterol (SYMBICORT) 160-4.5 MCG/ACT inhaler Inhale 2 puffs into the lungs 2 (two) times daily. 05/22/15  Yes [provider]  diltiazem (CARDIZEM SR) 60 MG 12 hr capsule Take 60 mg by mouth daily.    Yes [provider]  ferrous sulfate 325 (65 FE) MG tablet Take 325 mg by mouth daily with breakfast.   Yes [provider]  glipiZIDE (GLUCOTROL) 5 MG tablet Take by mouth 2 (two) times daily before a meal.    Yes [provider]  hydrochlorothiazide (HYDRODIURIL) 25 MG tablet Take 25 mg by  mouth daily.   Yes [provider]  lamoTRIgine (LAMICTAL) 200 MG tablet Take 200 mg by mouth 2 (two) times daily.    Yes [provider]  losartan (COZAAR) 50 MG tablet Take 50 mg by mouth daily.   Yes [provider]  Multiple Vitamin (MULTI-VITAMINS) TABS Take 1 tablet by mouth daily.   Yes [provider]  niacin 500 MG CR capsule Take 500 mg by mouth daily. 08/15/15  Yes [provider]  Potassium 99 MG TABS Take 99 mg by mouth daily.   Yes [provider]  sitaGLIPtin (JANUVIA) 50 MG tablet Take 50 mg by mouth daily.   Yes [provider]  tiotropium (SPIRIVA) 18 MCG inhalation capsule Place 18 mcg into inhaler and inhale daily.   Yes [provider]  topiramate (TOPAMAX) 200 MG tablet Take 200 mg by mouth 2 (two) times daily.   Yes [provider]  diazepam (VALIUM) 2 MG tablet Take 1 tablet (2 mg total) by mouth every 8 (eight) hours as needed for muscle spasms. Patient not taking: Reported on 11/01/2016 07/29/16   Tommi Rumps, PA-C  HYDROcodone-acetaminophen (NORCO/VICODIN) 5-325 MG tablet Take 1 tablet by mouth every 4 (four) hours as needed for moderate pain. Patient not taking: Reported on 11/01/2016 07/29/16   Tommi Rumps, PA-C     Allergies Patient has no known allergies.   History reviewed. No pertinent family history.  Social History Social History  Substance Use Topics  . Smoking status: Never Smoker  . Smokeless  tobacco: Never Used  . Alcohol use No    Review of Systems  Constitutional:   No fever or chills.  ENT:   No sore throat. No rhinorrhea. Cardiovascular:   No chest pain or syncope. Respiratory:   positive as above shortness of breath and cough . Gastrointestinal:   Negative for abdominal pain, vomiting and diarrhea.  Musculoskeletal:   Negative for focal pain or swelling All other systems reviewed and are negative except as documented above in ROS and  HPI.  ____________________________________________   PHYSICAL EXAM:  VITAL SIGNS: ED Triage Vitals [11/01/16 1136]  Enc Vitals Group     BP 113/66     Pulse Rate 85     Resp (!) 24     Temp 98.3 F (36.8 C)     Temp Source Oral     SpO2 91 %     Weight 230 lb (104.3 kg)     Height 5\' 4"  (1.626 m)     Head Circumference      Peak Flow      Pain Score      Pain Loc      Pain Edu?      Excl. in GC?     Vital signs reviewed, nursing assessments reviewed.   Constitutional:   Alert and oriented.mild respiratory distress. Eyes:   No scleral icterus.  EOMI. No nystagmus. No conjunctival pallor. PERRL. ENT   Head:   Normocephalic and atraumatic.   Nose:   No congestion/rhinnorhea.    Mouth/Throat:   MMM, no pharyngeal erythema. No peritonsillar mass.    Neck:   No meningismus. Full ROM Hematological/Lymphatic/Immunilogical:   No cervical lymphadenopathy. Cardiovascular:   RRR. Symmetric bilateral radial and DP pulses.  No murmurs.  Respiratory:   tachypnea, increased work of breathing with accessory muscle use. Diffuse expiratory wheezing with prolonged expiratory phase. No focal crackles.  Gastrointestinal:   Soft and nontender. Non distended. There is no CVA tenderness.  No rebound, rigidity, or guarding. Genitourinary:   deferred Musculoskeletal:   Normal range of motion in all extremities. No joint effusions.  No lower extremity tenderness.  No edema. Neurologic:   Normal speech and language.  Motor grossly intact. No gross focal neurologic deficits are appreciated.  Skin:    Skin is warm, dry and intact. No rash noted.  No petechiae, purpura, or bullae.  ____________________________________________    LABS (pertinent positives/negatives) (all labs ordered are listed, but only abnormal results are displayed) Labs Reviewed  BASIC METABOLIC PANEL - Abnormal; Notable for the following:       Result Value   Glucose, Bld 175 (*)    Creatinine, Ser 1.16 (*)     GFR calc non Af Amer 50 (*)    GFR calc Af Amer 58 (*)    All other components within normal limits  CBC - Abnormal; Notable for the following:    RBC 5.74 (*)    Hemoglobin 16.7 (*)    HCT 52.6 (*)    MCHC 31.7 (*)    RDW 14.8 (*)    All other components within normal limits  BLOOD GAS, VENOUS - Abnormal; Notable for the following:    pH, Ven 7.23 (*)    pCO2, Ven 85 (*)    pO2, Ven 31.0 (*)    Bicarbonate 35.6 (*)    Acid-Base Excess 4.0 (*)    All other components within normal limits  TROPONIN I   ____________________________________________   EKG  interpreted by me  Sinus rhythm rate of 83, normal axis. Prolonged QTC of 594. Normal QRS ST segments and T waves.  ____________________________________________    RADIOLOGY  Dg Chest 2 View  Result Date: 11/01/2016 CLINICAL DATA:  Cough, shortness of breath, and fever for the past 3 days. The patient is on 2 L of oxygen at home. History of COPD, discontinue smoking 14 years ago. EXAM: CHEST  2 VIEW COMPARISON:  Chest x-ray of March 31, 2015 FINDINGS: The lungs are adequately inflated. The interstitial markings are mildly increased. There are coarse lung markings in both lower lobes. The heart and pulmonary vascularity are normal. The mediastinum is normal in width. There is calcification in the wall of the aortic arch. There is no pleural effusion. IMPRESSION: Patchy increased density at both bases greatest on the right worrisome for atelectasis or pneumonia. Underlying COPD. Followup PA and lateral chest X-ray is recommended in 3-4 weeks following trial of antibiotic therapy to ensure resolution and exclude underlying malignancy. Electronically Signed   By: David  [place] M.D.   On: 11/01/2016 12:33    ____________________________________________   PROCEDURES Procedures CRITICAL CARE Performed by: Scotty Court, Gabrelle Roca   Total critical care time: 35 minutes  Critical care time was exclusive of separately billable  procedures and treating other patients.  Critical care was necessary to treat or prevent imminent or life-threatening deterioration.  Critical care was time spent personally by me on the following activities: development of treatment plan with patient and/or surrogate as well as nursing, discussions with consultants, evaluation of patient's response to treatment, examination of patient, obtaining history from patient or surrogate, ordering and performing treatments and interventions, ordering and review of laboratory studies, ordering and review of radiographic studies, pulse oximetry and re-evaluation of patient's condition.  ____________________________________________   INITIAL IMPRESSION / ASSESSMENT AND PLAN / ED COURSE  Pertinent labs & imaging results that were available during my care of the patient were reviewed by me and considered in my medical decision making (see chart for details).  Patient presents with shortness of breath, clinically consistent with severe COPD exacerbation in the setting of chronic hypoxic respiratory failurewithout acute worsening ofhypoxia.her chest x-ray is consistent with pneumonia. bronchodilators and steroids for initial treatment.  VBG also shows acute respiratory acidosis.  BiPAP ordered for respiratory support and treatment of her acidosis. Ceftriaxone and azithromycin ordered for her pneumonia. Would avoid fluoroquinolones due to her prolonged QTC and her history of paroxysmal A. fib.plan to admit.      ____________________________________________   FINAL CLINICAL IMPRESSION(S) / ED DIAGNOSES  Final diagnoses:  COPD exacerbation (HCC)  Community acquired pneumonia, unspecified laterality  Acute respiratory acidosis      New Prescriptions   No medications on file     Portions of this note were generated with dragon dictation software. Dictation errors may occur despite best attempts at proofreading.    Sharman Cheek, MD 11/01/16  1410

## 2016-11-01 NOTE — Progress Notes (Signed)
eLink Physician-Brief Progress Note Patient Name: Brenda Wu DOB: [birth year] MRN: [id number]   Date of Service  11/01/2016  HPI/Events of Note  COPD/ OSA overlap with acute resp acidosis -venous pH 7.23 -exacerbation due to acute bronchitis -comfortable on bipap  eICU Interventions  Camera check Iv steroids + BDs + Abx Trial off bipap in a few hrs, then noct bipap     Intervention Category Evaluation Type: New Patient Evaluation  Sahand Gosch V. 11/01/2016, 5:12 PM

## 2016-11-01 NOTE — ED Notes (Addendum)
Pt resting in bed at this time. No change in patient condition. Pt remains on BiPap at this time. Will continue to monitor for further patient needs.

## 2016-11-01 NOTE — ED Notes (Signed)
Date and time results received: 11/01/16 1:21 PM  Test: VBG Critical Value: pH 7.23, CO2: 85  Name of Provider Notified: Dr. Scotty Court  Orders Received? Or Actions Taken?: Critical Results Acknowledged

## 2016-11-01 NOTE — ED Notes (Signed)
Pt in Xray at this time.

## 2016-11-01 NOTE — H&P (Signed)
Brenda Wu is an 61 y.o. female.   Chief Complaint: Shortness of breath HPI: This is 61 year old female has a history of oxygen dependent COPD. She tells me she also wears BiPAP at night. She's had a 3 day history of worsening shortness of breath. She's also had cough, subjective fever and increased wheezing. Is progressively gotten worse despite her use of her bronchodilators. She was very tachypnea, presentation and was found to be in a restaurant acidosis and was placed on BiPAP. She appears to be more comfortable on the BiPAP. She is able to answer questions. She also complained of some right sided chest pain especially when she coughs.  Past Medical History:  Diagnosis Date  . Diabetes mellitus without complication ([place])   . Hypercholesteremia   . Hypertension   . Seizures ([place])     History reviewed. No pertinent surgical history.  History reviewed. No pertinent family history. Social History:  reports that she has never smoked. She has never used smokeless tobacco. She reports that she does not drink alcohol. Her drug history is not on file.  Allergies: No Known Allergies   (Not in a hospital admission)  Results for orders placed or performed during the hospital encounter of 11/01/16 (from the past 48 hour(s))  Basic metabolic panel     Status: Abnormal   Collection Time: 11/01/16 11:37 AM  Result Value Ref Range   Sodium 142 135 - 145 mmol/L   Potassium 3.7 3.5 - 5.1 mmol/L   Chloride 104 101 - 111 mmol/L   CO2 31 22 - 32 mmol/L   Glucose, Bld 175 (H) 65 - 99 mg/dL   BUN 15 6 - 20 mg/dL   Creatinine, Ser 1.16 (H) 0.44 - 1.00 mg/dL   Calcium 9.5 8.9 - 10.3 mg/dL   GFR calc non Af Amer 50 (L) >60 mL/min   GFR calc Af Amer 58 (L) >60 mL/min    Comment: (NOTE) The eGFR has been calculated using the CKD EPI equation. This calculation has not been validated in all clinical situations. eGFR's persistently <60 mL/min signify possible Chronic Kidney Disease.    Anion gap  7 5 - 15  CBC     Status: Abnormal   Collection Time: 11/01/16 11:37 AM  Result Value Ref Range   WBC 8.4 3.6 - 11.0 K/uL   RBC 5.74 (H) 3.80 - 5.20 MIL/uL   Hemoglobin 16.7 (H) 12.0 - 16.0 g/dL   HCT 52.6 (H) 35.0 - 47.0 %   MCV 91.7 80.0 - 100.0 fL   MCH 29.1 26.0 - 34.0 pg   MCHC 31.7 (L) 32.0 - 36.0 g/dL   RDW 14.8 (H) 11.5 - 14.5 %   Platelets 170 150 - 440 K/uL  Troponin I     Status: None   Collection Time: 11/01/16 11:37 AM  Result Value Ref Range   Troponin I <0.03 <0.03 ng/mL  Blood gas, venous     Status: Abnormal   Collection Time: 11/01/16 12:53 PM  Result Value Ref Range   pH, Ven 7.23 (L) 7.250 - 7.430   pCO2, Ven 85 (HH) 44.0 - 60.0 mmHg    Comment: CRITICAL RESULT CALLED TO, READ BACK BY AND VERIFIED WITH: NOTIFIED TO MEGAN,rn AT 1318 ON 11/01/16 BY GKM    pO2, Ven 31.0 (LL) 32.0 - 45.0 mmHg    Comment: VENOUS   Bicarbonate 35.6 (H) 20.0 - 28.0 mmol/L   Acid-Base Excess 4.0 (H) 0.0 - 2.0 mmol/L   O2 Saturation 46.5 %  Patient temperature 37.0    Collection site VEIN    Sample type VENOUS    Dg Chest 2 View  Result Date: 11/01/2016 CLINICAL DATA:  Cough, shortness of breath, and fever for the past 3 days. The patient is on 2 L of oxygen at home. History of COPD, discontinue smoking 14 years ago. EXAM: CHEST  2 VIEW COMPARISON:  Chest x-ray of March 31, 2015 FINDINGS: The lungs are adequately inflated. The interstitial markings are mildly increased. There are coarse lung markings in both lower lobes. The heart and pulmonary vascularity are normal. The mediastinum is normal in width. There is calcification in the wall of the aortic arch. There is no pleural effusion. IMPRESSION: Patchy increased density at both bases greatest on the right worrisome for atelectasis or pneumonia. Underlying COPD. Followup PA and lateral chest X-ray is recommended in 3-4 weeks following trial of antibiotic therapy to ensure resolution and exclude underlying malignancy. Electronically  Signed   By: David  [place] M.D.   On: 11/01/2016 12:33    Review of Systems  Constitutional: Negative for chills and fever.  HENT: Negative for hearing loss.   Eyes: Negative for blurred vision.  Respiratory: Positive for cough, shortness of breath and wheezing.   Cardiovascular: Negative for chest pain.  Gastrointestinal: Negative for nausea and vomiting.  Genitourinary: Negative for dysuria.  Musculoskeletal: Negative for myalgias.  Skin: Negative for rash.  Neurological: Negative for dizziness.    Blood pressure 115/77, pulse 84, temperature 98.3 F (36.8 C), temperature source Oral, resp. rate 16, height '5\' 4"'$  (1.626 m), weight 104.3 kg (230 lb), SpO2 97 %. Physical Exam  Constitutional: She is oriented to person, place, and time. She appears well-developed and well-nourished.  Mild respiratory distress  HENT:  Head: Normocephalic and atraumatic.  Mouth/Throat: Oropharynx is clear and moist. No oropharyngeal exudate.  Eyes: Pupils are equal, round, and reactive to light. EOM are normal. No scleral icterus.  Neck: Normal range of motion. Neck supple. No JVD present. No thyromegaly present.  Cardiovascular: Normal rate and regular rhythm.   No murmur heard. Respiratory:  Inspiratory next 40 wheezing. Mild scattered rhonchi. Using a sensory muscles. Currently on BiPAP  GI: Soft. Bowel sounds are normal. She exhibits no mass. There is no tenderness.  Musculoskeletal: She exhibits no edema or tenderness.  Neurological: She is alert and oriented to person, place, and time. No cranial nerve deficit.  Skin: Skin is warm and dry.     Assessment/Plan 1. Acute on chronic respiratory failure. Currently on BiPAP support. She is a CO2 retainer. We will continue BiPAP support for now and treat her underlying calls and try to wean her. 2. COPD exacerbation. Suspect she may have some bronchitis is triggered this. We will start some antibiotics. Put her on IV steroids and frequent  bronchodilator nebulizers. We'll hold her home inhalers and then transition her back to them when she is off BiPAP. 3. Bronchitis. Chest x-ray was read as possible right lower lobe pneumonia. Don't fully appreciate this on chest x-ray. However she could have bronchitis triggering this off. We'll go ahead and cover with antibiotics for now. If does not improve will need repeat chest x-ray. Neck slight 4. Diabetes. We'll continue home medications will also add sliding scale since she'll be on steroids.  Total time spent 45 minutes  Baxter Hire, MD 11/01/2016, 2:50 PM

## 2016-11-02 DIAGNOSIS — J441 Chronic obstructive pulmonary disease with (acute) exacerbation: Principal | ICD-10-CM

## 2016-11-02 DIAGNOSIS — J9621 Acute and chronic respiratory failure with hypoxia: Secondary | ICD-10-CM

## 2016-11-02 LAB — BASIC METABOLIC PANEL
ANION GAP: 8 (ref 5–15)
BUN: 16 mg/dL (ref 6–20)
CO2: 31 mmol/L (ref 22–32)
Calcium: 8.7 mg/dL — ABNORMAL LOW (ref 8.9–10.3)
Chloride: 103 mmol/L (ref 101–111)
Creatinine, Ser: 1.04 mg/dL — ABNORMAL HIGH (ref 0.44–1.00)
GFR calc Af Amer: >60 mL/min (ref >60)
GFR calc non Af Amer: 57 mL/min — ABNORMAL LOW (ref >60)
GLUCOSE: 299 mg/dL — AB (ref 65–99)
POTASSIUM: 3.9 mmol/L (ref 3.5–5.1)
Sodium: 142 mmol/L (ref 135–145)

## 2016-11-02 LAB — GLUCOSE, CAPILLARY
GLUCOSE-CAPILLARY: 101 mg/dL — AB (ref 65–99)
Glucose-Capillary: 156 mg/dL — ABNORMAL HIGH (ref 65–99)
Glucose-Capillary: 166 mg/dL — ABNORMAL HIGH (ref 65–99)
Glucose-Capillary: 205 mg/dL — ABNORMAL HIGH (ref 65–99)

## 2016-11-02 LAB — CBC
HEMATOCRIT: 50.6 % — AB (ref 35.0–47.0)
HEMOGLOBIN: 15.9 g/dL (ref 12.0–16.0)
MCH: 29.3 pg (ref 26.0–34.0)
MCHC: 31.5 g/dL — AB (ref 32.0–36.0)
MCV: 93.2 fL (ref 80.0–100.0)
Platelets: 161 10*3/uL (ref 150–440)
RBC: 5.43 MIL/uL — ABNORMAL HIGH (ref 3.80–5.20)
RDW: 15.1 % — AB (ref 11.5–14.5)
WBC: 6.4 10*3/uL (ref 3.6–11.0)

## 2016-11-02 MED ORDER — BUDESONIDE 0.5 MG/2ML IN SUSP
0.5000 mg | Freq: Two times a day (BID) | RESPIRATORY_TRACT | Status: DC
  Administered 2016-11-02 – 2016-11-04 (×4): 0.5 mg via RESPIRATORY_TRACT
  Filled 2016-11-02 (×4): qty 2

## 2016-11-02 MED ORDER — DOXYCYCLINE HYCLATE 100 MG PO TABS
100.0000 mg | ORAL_TABLET | Freq: Two times a day (BID) | ORAL | Status: DC
  Administered 2016-11-02 – 2016-11-04 (×5): 100 mg via ORAL
  Filled 2016-11-02 (×5): qty 1

## 2016-11-02 MED ORDER — ALBUTEROL SULFATE (2.5 MG/3ML) 0.083% IN NEBU: 2.5000 mg | INHALATION_SOLUTION | RESPIRATORY_TRACT | Status: DC | PRN

## 2016-11-02 MED ORDER — PANTOPRAZOLE SODIUM 40 MG PO TBEC
40.0000 mg | DELAYED_RELEASE_TABLET | Freq: Every day | ORAL | Status: DC
  Administered 2016-11-02 – 2016-11-04 (×3): 40 mg via ORAL
  Filled 2016-11-02 (×3): qty 1

## 2016-11-02 NOTE — Progress Notes (Signed)
Pt has refused to wear her BIPAP tonight during sleep and stated that she didn't need it.  Pt was also educated on the importance of being compliant with her BIPAP therapy.

## 2016-11-02 NOTE — Progress Notes (Signed)
[facility] - [place] at [facility]   PATIENT NAME: Brenda Wu    MR#:  [id number]  DATE OF BIRTH:  November 18, 1955  SUBJECTIVE:   Patient here due to shortness of breath and noted to be in COPD exacerbation. Patient was in acute on chronic hypoxic and hypercapnic respiratory failure initially on BiPAP but now weaned off of it. Shortness of breath much improved since admission.  REVIEW OF SYSTEMS:    Review of Systems  Constitutional: Negative for chills and fever.  HENT: Negative for congestion and tinnitus.   Eyes: Negative for blurred vision and double vision.  Respiratory: Positive for cough, sputum production and shortness of breath. Negative for wheezing.   Cardiovascular: Negative for chest pain, orthopnea and PND.  Gastrointestinal: Negative for abdominal pain, diarrhea, nausea and vomiting.  Genitourinary: Negative for dysuria and hematuria.  Neurological: Negative for dizziness, sensory change and focal weakness.  All other systems reviewed and are negative.   Nutrition: carb modifed Tolerating Diet: yes Tolerating PT: Await Eval.    DRUG ALLERGIES:  No Known Allergies  VITALS:  Blood pressure 125/70, pulse 91, temperature 98.2 F (36.8 C), temperature source Oral, resp. rate 19, height  (1.626 m), weight 104.3 kg (230 lb), SpO2 94 %.  PHYSICAL EXAMINATION:   Physical Exam  GENERAL:  61 y.o.-year-old patient lying in bed in no acute distress.  EYES: Pupils equal, round, reactive to light and accommodation. No scleral icterus. Extraocular muscles intact.  HEENT: Head atraumatic, normocephalic. Oropharynx and nasopharynx clear.  NECK:  Supple, no jugular venous distention. No thyroid enlargement, no tenderness.  LUNGS: Normal breath sounds bilaterally, insp & exp. Wheezing b/l, No rales, rhonchi. No use of accessory muscles of respiration.  CARDIOVASCULAR: S1, S2 normal. No murmurs, rubs, or gallops.  ABDOMEN: Soft, nontender, nondistended.  Bowel sounds present. No organomegaly or mass.  EXTREMITIES: No cyanosis, clubbing or edema b/l.    NEUROLOGIC: Cranial nerves II through XII are intact. No focal Motor or sensory deficits b/l.   PSYCHIATRIC: The patient is alert and oriented x 3.  SKIN: No obvious rash, lesion, or ulcer.    LABORATORY PANEL:   CBC  Recent Labs Lab 11/02/16 0324  WBC 6.4  HGB 15.9  HCT 50.6*  PLT 161   ------------------------------------------------------------------------------------------------------------------  Chemistries   Recent Labs Lab 11/02/16 0324  NA 142  K 3.9  CL 103  CO2 31  GLUCOSE 299*  BUN 16  CREATININE 1.04*  CALCIUM 8.7*   ------------------------------------------------------------------------------------------------------------------  Cardiac Enzymes  Recent Labs Lab 11/01/16 1137  TROPONINI <0.03   ------------------------------------------------------------------------------------------------------------------  RADIOLOGY:  Dg Chest 2 View  Result Date: 11/01/2016 CLINICAL DATA:  Cough, shortness of breath, and fever for the past 3 days. The patient is on 2 L of oxygen at home. History of COPD, discontinue smoking 14 years ago. EXAM: CHEST  2 VIEW COMPARISON:  Chest x-ray of March 31, 2015 FINDINGS: The lungs are adequately inflated. The interstitial markings are mildly increased. There are coarse lung markings in both lower lobes. The heart and pulmonary vascularity are normal. The mediastinum is normal in width. There is calcification in the wall of the aortic arch. There is no pleural effusion. IMPRESSION: Patchy increased density at both bases greatest on the right worrisome for atelectasis or pneumonia. Underlying COPD. Followup PA and lateral chest X-ray is recommended in 3-4 weeks following trial of antibiotic therapy to ensure resolution and exclude underlying malignancy. Electronically Signed   By: David  [place] M.D.  On: 11/01/2016 12:33      ASSESSMENT AND PLAN:   61 year old female with past medical history ofdiabetes, hypertension, hyperlipidemia, history of seizures, COPD who presented to the hospital due to shortness of breath.  1. Acute on chronic respiratory failure with hypoxia and hypercapnia-secondary to COPD exacerbation. -Initially was on BiPAP but now weaned off of it. Much improved since yesterday. -ContinueIV steroids,scheduled DuoNeb's,Pulmicort nebs, and empiric doxycycline.  2. COPD exacerbation-this is the cause of patient's worsening respiratory failure. -continue IV steroids, scheduled DuoNeb's, Pulmicort nebs, empiric antibiotics. Much improved since yesterday.  3. DM TYpe II w/out complicaiton - cont. Glipizide, Januvia, SSI.   4. Essential hypertension-continue losartan,hydrochlorothiazide, Cardizem.  5. History of seizures-continue Lamictal.  6. GERD-continue Protonix.   All the records are reviewed and case discussed with Care Management/Social Worker. Management plans discussed with the patient, family and they are in agreement.  CODE STATUS: Full code  DVT Prophylaxis: Lovenox  TOTAL TIME TAKING CARE OF THIS PATIENT: 30 minutes.   POSSIBLE D/C IN 1-2 DAYS, DEPENDING ON CLINICAL CONDITION.   Houston Siren M.D on 11/02/2016 at 3:27 PM  Between 7am to 6pm - Pager - [phone]  After 6pm go to [email or web address] - Social research officer, government  [facility]  Office  [phone]  CC: Primary care physician; Patient, No Pcp Per

## 2016-11-02 NOTE — Care Management (Signed)
Patient admitted to icu stepdown due to need for continuous bipap.  She is now on nasal cannula.  Chronic  oxygen through [facility] for copd.  Patient has not had frequent presentation/ admissions for this diagnosis. Have discussed NIV criteria with [facility] and at present has not met the criteria

## 2016-11-02 NOTE — Progress Notes (Signed)
Comfortable on [place] O2 No new complaints Cognition intact  Vitals:   11/02/16 0800 11/02/16 0803 11/02/16 0900 11/02/16 1000  BP: 125/84  125/89 134/83  Pulse: 80  84 92  Resp: 12  19 (!) 23  Temp: 97.9 F (36.6 C)     TempSrc: Oral     SpO2: 94% 94% 94% 93%  Weight:      Height:        HEENT WNL No JVD Diffuse scattered wheezes Regular, no M Obese, soft, NABS Extremities warm, no edema No focal neurologic deficits  BMP Latest Ref Rng & Units 11/02/2016 11/01/2016  Glucose 65 - 99 mg/dL 026(V) 785(Y)  BUN 6 - 20 mg/dL 16 15  Creatinine 8.50 - 1.00 mg/dL 2.77(A) 1.28(N)  Sodium 135 - 145 mmol/L 142 142  Potassium 3.5 - 5.1 mmol/L 3.9 3.7  Chloride 101 - 111 mmol/L 103 104  CO2 22 - 32 mmol/L 31 31  Calcium 8.9 - 10.3 mg/dL 8.6(V) 9.5    CBC Latest Ref Rng & Units 11/02/2016 11/01/2016  WBC 3.6 - 11.0 K/uL 6.4 8.4  Hemoglobin 12.0 - 16.0 g/dL 67.2 16.7(H)  Hematocrit 35.0 - 47.0 % 50.6(H) 52.6(H)  Platelets 150 - 440 K/uL 161 170    No new chest x-ray  IMPRESSION: Acute respiratory failure COPD exacerbation Doubt PNA Type 2 diabetes, controlled  PLAN/REC: Continue supplemental O2 Continue systemic steroids - at current dose. Can likely change to prednisone 09/12 Continue nebulized steroids and bronchodilators Change antibiotics to doxycycline by mouth and complete 5 days Transfer to MedSurg  At discharge, would complete antibiotics as above and complete a tapering dose of prednisone over 7-10 days. I will arrange follow-up in my office in the next 3-4 weeks.  After transfer, PCCM will sign off. Please call if we can be of further assistance    Billy Fischer, MD PCCM service Mobile [phone] Pager [phone] 11/02/2016 1:56 PM

## 2016-11-02 NOTE — Progress Notes (Signed)
Inpatient Diabetes Program Recommendations  AACE/ADA: New Consensus Statement on Inpatient Glycemic Control (2015)  Target Ranges:  Prepandial:   less than 140 mg/dL      Peak postprandial:   less than 180 mg/dL (1-2 hours)      Critically ill patients:  140 - 180 mg/dL   Lab Results  Component Value Date   GLUCAP 156 (H) 11/02/2016    Review of Glycemic ControlResults for Brenda, Wu (MRN [id number]) as of 11/02/2016 09:54  Ref. Range 11/01/2016 16:39 11/01/2016 21:19 11/01/2016 22:20 11/02/2016 07:19  Glucose-Capillary Latest Ref Range: 65 - 99 mg/dL 982 (H) 641 (H) 583 (H) 156 (H)   Diabetes history: Type 2 diabetes Outpatient Diabetes medications: Glucotrol 5 mg bid, Januvia 50 mg daily Current orders for Inpatient glycemic control:  Novolog resistant tid with meals and HS, Glucotrol 5 mg bid, Tradjenta 5 mg daily, Solumedrol 40 mg IV q 12 hours Inpatient Diabetes Program Recommendations:   While on steroids, please consider adding Novolog 4 units tid with meals-hold if patient eats less than 50%.    Thanks, Beryl Meager, RN, BC-ADM Inpatient Diabetes Coordinator Pager [phone] (8a-5p)

## 2016-11-03 LAB — GLUCOSE, CAPILLARY
GLUCOSE-CAPILLARY: 314 mg/dL — AB (ref 65–99)
GLUCOSE-CAPILLARY: 138 mg/dL — AB (ref 65–99)
Glucose-Capillary: 124 mg/dL — ABNORMAL HIGH (ref 65–99)
Glucose-Capillary: 163 mg/dL — ABNORMAL HIGH (ref 65–99)
Glucose-Capillary: 95 mg/dL (ref 65–99)

## 2016-11-03 LAB — HIV ANTIBODY (ROUTINE TESTING W REFLEX): HIV Screen 4th Generation wRfx: NONREACTIVE

## 2016-11-03 MED ORDER — GUAIFENESIN-DM 100-10 MG/5ML PO SYRP
5.0000 mL | ORAL_SOLUTION | ORAL | Status: DC | PRN
  Administered 2016-11-03: 5 mL via ORAL
  Filled 2016-11-03 (×2): qty 5

## 2016-11-03 MED ORDER — PREDNISONE 20 MG PO TABS
40.0000 mg | ORAL_TABLET | Freq: Every day | ORAL | Status: DC
  Administered 2016-11-04: 40 mg via ORAL
  Filled 2016-11-03: qty 2

## 2016-11-03 NOTE — Care Management (Signed)
RNCM spoke with RT (and MD) about NIV however patient has OSA and has CPAP at home. Per note patient is refusing to wear Bipap here.

## 2016-11-03 NOTE — Progress Notes (Signed)
[facility] - [place] at [facility]   PATIENT NAME: Brenda Wu    MR#:  [id number]  DATE OF BIRTH:  04-24-1955  SUBJECTIVE:   Patient here due to shortness of breath and noted to be in COPD exacerbation. Still having some wheezing but improved since yesterday. Awaiting transfer to floor.   REVIEW OF SYSTEMS:    Review of Systems  Constitutional: Negative for chills and fever.  HENT: Negative for congestion and tinnitus.   Eyes: Negative for blurred vision and double vision.  Respiratory: Positive for cough, sputum production and shortness of breath. Negative for wheezing.   Cardiovascular: Negative for chest pain, orthopnea and PND.  Gastrointestinal: Negative for abdominal pain, diarrhea, nausea and vomiting.  Genitourinary: Negative for dysuria and hematuria.  Neurological: Negative for dizziness, sensory change and focal weakness.  All other systems reviewed and are negative.   Nutrition: carb modifed Tolerating Diet: yes Tolerating PT: Await Eval.    DRUG ALLERGIES:  No Known Allergies  VITALS:  Blood pressure 127/67, pulse 90, temperature 98.3 F (36.8 C), temperature source Oral, resp. rate (!) 21, height  (1.626 m), weight 104.3 kg (230 lb), SpO2 93 %.  PHYSICAL EXAMINATION:   Physical Exam  GENERAL:  61 y.o.-year-old patient lying in bed in no acute distress.  EYES: Pupils equal, round, reactive to light and accommodation. No scleral icterus. Extraocular muscles intact.  HEENT: Head atraumatic, normocephalic. Oropharynx and nasopharynx clear.  NECK:  Supple, no jugular venous distention. No thyroid enlargement, no tenderness.  LUNGS: Normal breath sounds bilaterally, end-exp. Wheezing b/l, No rales, rhonchi. No use of accessory muscles of respiration.  CARDIOVASCULAR: S1, S2 normal. No murmurs, rubs, or gallops.  ABDOMEN: Soft, nontender, nondistended. Bowel sounds present. No organomegaly or mass.  EXTREMITIES: No cyanosis, clubbing  or edema b/l.    NEUROLOGIC: Cranial nerves II through XII are intact. No focal Motor or sensory deficits b/l.   PSYCHIATRIC: The patient is alert and oriented x 3.  SKIN: No obvious rash, lesion, or ulcer.    LABORATORY PANEL:   CBC  Recent Labs Lab 11/02/16 0324  WBC 6.4  HGB 15.9  HCT 50.6*  PLT 161   ------------------------------------------------------------------------------------------------------------------  Chemistries   Recent Labs Lab 11/02/16 0324  NA 142  K 3.9  CL 103  CO2 31  GLUCOSE 299*  BUN 16  CREATININE 1.04*  CALCIUM 8.7*   ------------------------------------------------------------------------------------------------------------------  Cardiac Enzymes  Recent Labs Lab 11/01/16 1137  TROPONINI <0.03   ------------------------------------------------------------------------------------------------------------------  RADIOLOGY:  No results found.   ASSESSMENT AND PLAN:   61 year old female with past medical history ofdiabetes, hypertension, hyperlipidemia, history of seizures, COPD who presented to the hospital due to shortness of breath.  1. Acute on chronic respiratory failure with hypoxia and hypercapnia-secondary to COPD exacerbation. - still having some wheezing/bronchospasm, off Bipap. Cont. O2.  -ContinueIV steroids,scheduled DuoNeb's,Pulmicort nebs, and empiric doxycycline.  2. COPD exacerbation-this is the cause of patient's worsening respiratory failure. Still having some wheezing and bronchospasm and will cont. Systemic therapy with steroids for another day. Discussed w/ pulmonary.  -continue IV steroids, scheduled DuoNeb's, Pulmicort nebs, empiric Doxycyline.   3. DM TYpe II w/out complicaiton - cont. Glipizide, Januvia, SSI.   4. Essential hypertension-continue losartan,hydrochlorothiazide, Cardizem.  5. History of seizures-continue Lamictal.  6. GERD-continue Protonix.  Transfer to floor and possible d/c tomorrow.    All the records are reviewed and case discussed with Care Management/Social Worker. Management plans discussed with the patient, family and they are  in agreement.  CODE STATUS: Full code  DVT Prophylaxis: Lovenox  TOTAL TIME TAKING CARE OF THIS PATIENT: 30 minutes.   POSSIBLE D/C IN 1-2 DAYS, DEPENDING ON CLINICAL CONDITION.   Houston Siren M.D on 11/03/2016 at 4:10 PM  Between 7am to 6pm - Pager - [phone]  After 6pm go to [email or web address] - Social research officer, government  [facility] [place] [facility]  Office  [phone]  CC: Primary care physician; Patient, No Pcp Per

## 2016-11-04 LAB — GLUCOSE, CAPILLARY: Glucose-Capillary: 159 mg/dL — ABNORMAL HIGH (ref 65–99)

## 2016-11-04 MED ORDER — PREDNISONE 10 MG PO TABS: ORAL_TABLET | ORAL | 0 refills | Status: DC

## 2016-11-04 MED ORDER — DOXYCYCLINE HYCLATE 100 MG PO TABS
100.0000 mg | ORAL_TABLET | Freq: Two times a day (BID) | ORAL | 0 refills | Status: AC
Start: 2016-11-04 — End: 2016-11-09

## 2016-11-04 NOTE — Care Management Note (Signed)
Case Management Note  Patient Details  Name: Brenda Wu MRN: [id number] Date of Birth: May 22, 1955  Subjective/Objective:  RNCM assessment for discharge planning. Met with patient at bedside. She lives at home with her spouse. Continues to work as a Programmer, applications. U[facility] O[facility] at [facility]. Offered home health RN and patient refused. Uses no DME. No further needs identified .Will sign off.                   Action/Plan: Case closed.   Expected Discharge Date:                  Expected Discharge Plan:  Home/Self Care  In-House Referral:     Discharge planning Services  CM Consult  Post Acute Care Choice:  Home Health Choice offered to:  Patient  DME Arranged:    DME Agency:     HH Arranged:  Patient Refused [place] Agency:     Status of Service:  Completed, signed off  If discussed at [employer] of Stay Meetings, dates discussed:    Additional Comments:  Jolly Mango, RN 11/04/2016, 9:04 AM

## 2016-11-04 NOTE — Progress Notes (Signed)
Patient is alert and oriented and able to verbalize needs. No complaints of pain at this time. VSS. PIV removed. Discharge instructions gone over with patient. Printed AVS and rx for Prednisone and Doxycycline given to patient. All belongings packed up. Patient called friend for transportation home. No concerns voiced at this time.   Suzan Slick, RN

## 2016-11-04 NOTE — Discharge Summary (Signed)
[facility] - [place] at [facility]   PATIENT NAME: Brenda Wu    MR#:  [id number]  DATE OF BIRTH:  September 28, 1955  DATE OF ADMISSION:  11/01/2016 ADMITTING PHYSICIAN: Gracelyn Nurse, MD  DATE OF DISCHARGE: 11/04/2016 11:42 AM  PRIMARY CARE PHYSICIAN: Patient, No Pcp Per    ADMISSION DIAGNOSIS:  Acute respiratory acidosis [E87.2] COPD exacerbation (HCC) [J44.1] Community acquired pneumonia, unspecified laterality [J18.9]  DISCHARGE DIAGNOSIS:  Active Problems:   Acute on chronic respiratory failure (HCC)   SECONDARY DIAGNOSIS:   Past Medical History:  Diagnosis Date  . Diabetes mellitus without complication (HCC)   . Hypercholesteremia   . Hypertension   . Seizures [facility])     HOSPITAL COURSE:   61 year old female with past medical history ofdiabetes, hypertension, hyperlipidemia, history of seizures, COPD who presented to the hospital due to shortness of breath.  1. Acute on chronic respiratory failure with hypoxia and hypercapnia-secondary to COPD exacerbation. - initially patient was admitted to the intensive care unit and was on BiPAP. Patient was treated with IV steroids, scheduled DuoNeb nebs, Pulmicort nebs and also empiric doxycycline. - She has not been weaned off the BiPAP and her bronchospasm and wheezing is significantly improved. She is being discharged on oral prednisone taper, maintenance of her inhalers,  nebulizers as needed and empiric doxycycline and follow up with Pulmonary as outpatient (Dr. Bard Herbert)  2. COPD exacerbation-this was the cause of patient's worsening respiratory failure.  - this improved w/ systemic therapy with IV steroids, nebs, pulmicort nebs, Antibiotics.  - pt. Now being discharged on Oral Pred. Taper, empiric abx and inhalers as needed as mentioned below.   3. DM TYpe II w/out complicaiton - pt. Will cont. Glipizide, Januvia upon discharge.    4. Essential hypertension- pt. Will continue  losartan,hydrochlorothiazide, Cardizem.  5. History of seizures- pt. Will continue Lamictal, Topomax - no acute seizures while in the hospital.   DISCHARGE CONDITIONS:   Stable.   CONSULTS OBTAINED:    DRUG ALLERGIES:  No Known Allergies  DISCHARGE MEDICATIONS:   Allergies as of 11/04/2016   No Known Allergies     Medication List    STOP taking these medications   diazepam 2 MG tablet Commonly known as:  VALIUM   HYDROcodone-acetaminophen 5-325 MG tablet Commonly known as:  NORCO/VICODIN     TAKE these medications   albuterol 108 (90 Base) MCG/ACT inhaler Commonly known as:  PROVENTIL HFA;VENTOLIN HFA Inhale 2 puffs into the lungs 4 (four) times daily as needed.   atorvastatin 40 MG tablet Commonly known as:  LIPITOR Take 40 mg by mouth daily.   diltiazem 60 MG 12 hr capsule Commonly known as:  CARDIZEM SR Take 60 mg by mouth daily.   doxycycline 100 MG tablet Commonly known as:  VIBRA-TABS Take 1 tablet (100 mg total) by mouth every 12 (twelve) hours.   ferrous sulfate 325 (65 FE) MG tablet Take 325 mg by mouth daily with breakfast.   glipiZIDE 5 MG tablet Commonly known as:  GLUCOTROL Take by mouth 2 (two) times daily before a meal.   hydrochlorothiazide 25 MG tablet Commonly known as:  HYDRODIURIL Take 25 mg by mouth daily.   JANUVIA 50 MG tablet Generic drug:  sitaGLIPtin Take 50 mg by mouth daily.   lamoTRIgine 200 MG tablet Commonly known as:  LAMICTAL Take 200 mg by mouth 2 (two) times daily.   losartan 50 MG tablet Commonly known as:  COZAAR Take 50 mg by mouth daily.  MULTI-VITAMINS Tabs Take 1 tablet by mouth daily.   niacin 500 MG CR capsule Take 500 mg by mouth daily.   Potassium 99 MG Tabs Take 99 mg by mouth daily.   predniSONE 10 MG tablet Commonly known as:  DELTASONE Label  & dispense according to the schedule below. 5 Pills PO for 1 day then, 4 Pills PO for 1 day, 3 Pills PO for 1 day, 2 Pills PO for 1 day, 1 Pill  PO for 1 days then STOP.   SYMBICORT 160-4.5 MCG/ACT inhaler Generic drug:  budesonide-formoterol Inhale 2 puffs into the lungs 2 (two) times daily.   tiotropium 18 MCG inhalation capsule Commonly known as:  SPIRIVA Place 18 mcg into inhaler and inhale daily.   topiramate 200 MG tablet Commonly known as:  TOPAMAX Take 200 mg by mouth 2 (two) times daily.            Discharge Care Instructions        Start     Ordered   11/04/16 0000  doxycycline (VIBRA-TABS) 100 MG tablet  Every 12 hours     11/04/16 1015   11/04/16 0000  Activity as tolerated - No restrictions     11/04/16 1015   11/04/16 0000  Diet - low sodium heart healthy     11/04/16 1015   11/04/16 0000  predniSONE (DELTASONE) 10 MG tablet     11/04/16 1015   11/04/16 0000  Diet Carb Modified     11/04/16 1015        DISCHARGE INSTRUCTIONS:   DIET:  Cardiac diet and Diabetic diet  DISCHARGE CONDITION:  Stable  ACTIVITY:  Activity as tolerated  OXYGEN:  Home Oxygen: Yes.     Oxygen Delivery: 2 liters/min via Patient connected to nasal cannula oxygen  DISCHARGE LOCATION:  home   If you experience worsening of your admission symptoms, develop shortness of breath, life threatening emergency, suicidal or homicidal thoughts you must seek medical attention immediately by calling 911 or calling your MD immediately  if symptoms less severe.  You Must read complete instructions/literature along with all the possible adverse reactions/side effects for all the Medicines you take and that have been prescribed to you. Take any new Medicines after you have completely understood and accpet all the possible adverse reactions/side effects.   Please note  You were cared for by a hospitalist during your hospital stay. If you have any questions about your discharge medications or the care you received while you were in the hospital after you are discharged, you can call the unit and asked to speak with the  hospitalist on call if the hospitalist that took care of you is not available. Once you are discharged, your primary care physician will handle any further medical issues. Please note that NO REFILLS for any discharge medications will be authorized once you are discharged, as it is imperative that you return to your primary care physician (or establish a relationship with a primary care physician if you do not have one) for your aftercare needs so that they can reassess your need for medications and monitor your lab values.     Today   Shortness of breath, wheezing much improved.  No other complaints.  Wants to go home.   VITAL SIGNS:  Blood pressure 129/67, pulse 79, temperature 98.3 F (36.8 C), temperature source Oral, resp. rate 14, height 5\' 4"  (1.626 m), weight 104.3 kg (230 lb), SpO2 97 %.  I/O:   Intake/Output Summary (  Last 24 hours) at 11/04/16 1553 Last data filed at 11/03/16 1800  Gross per 24 hour  Intake                0 ml  Output              500 ml  Net             -500 ml    PHYSICAL EXAMINATION:  GENERAL:  61 y.o.-year-old patient lying in the bed with no acute distress.  EYES: Pupils equal, round, reactive to light and accommodation. No scleral icterus. Extraocular muscles intact.  HEENT: Head atraumatic, normocephalic. Oropharynx and nasopharynx clear.  NECK:  Supple, no jugular venous distention. No thyroid enlargement, no tenderness.  LUNGS: Normal breath sounds bilaterally, no wheezing, rales,rhonchi. No use of accessory muscles of respiration.  CARDIOVASCULAR: S1, S2 normal. No murmurs, rubs, or gallops.  ABDOMEN: Soft, non-tender, non-distended. Bowel sounds present. No organomegaly or mass.  EXTREMITIES: No pedal edema, cyanosis, or clubbing.  NEUROLOGIC: Cranial nerves II through XII are intact. No focal motor or sensory defecits b/l.  PSYCHIATRIC: The patient is alert and oriented x 3.  SKIN: No obvious rash, lesion, or ulcer.   DATA REVIEW:    CBC  Recent Labs Lab 11/02/16 0324  WBC 6.4  HGB 15.9  HCT 50.6*  PLT 161    Chemistries   Recent Labs Lab 11/02/16 0324  NA 142  K 3.9  CL 103  CO2 31  GLUCOSE 299*  BUN 16  CREATININE 1.04*  CALCIUM 8.7*    Cardiac Enzymes  Recent Labs Lab 11/01/16 1137  TROPONINI <0.03    Microbiology Results  Results for orders placed or performed during the hospital encounter of 11/01/16  MRSA PCR Screening     Status: None   Collection Time: 11/01/16  9:09 PM  Result Value Ref Range Status   MRSA by PCR NEGATIVE NEGATIVE Final    Comment:        The GeneXpert MRSA Assay (FDA approved for NASAL specimens only), is one component of a comprehensive MRSA colonization surveillance program. It is not intended to diagnose MRSA infection nor to guide or monitor treatment for MRSA infections.     RADIOLOGY:  No results found.    Management plans discussed with the patient, family and they are in agreement.  CODE STATUS:  Code Status History    Date Active Date Inactive Code Status Order ID Comments User Context   11/01/2016  5:26 PM 11/04/2016  2:47 PM Full Code [id number]  Gracelyn Nurse, MD Inpatient      TOTAL TIME TAKING CARE OF THIS PATIENT: 40 minutes.    Houston Siren M.D on 11/04/2016 at 3:53 PM  Between 7am to 6pm - Pager - [phone]  After 6pm go to [email or web address] - Social research officer, government  [facility] [place] [facility]  Office  [phone]  CC: Primary care physician; Patient, No Pcp Per

## 2016-12-01 ENCOUNTER — Encounter: Payer: Self-pay | Admitting: Pulmonary Disease

## 2016-12-01 ENCOUNTER — Ambulatory Visit (INDEPENDENT_AMBULATORY_CARE_PROVIDER_SITE_OTHER): Payer: Medicare HMO | Admitting: Pulmonary Disease

## 2016-12-01 VITALS — BP 126/88 | HR 79 | Resp 16 | Ht 64.0 in | Wt 232.0 lb

## 2016-12-01 DIAGNOSIS — E668 Other obesity: Secondary | ICD-10-CM

## 2016-12-01 DIAGNOSIS — G4733 Obstructive sleep apnea (adult) (pediatric): Secondary | ICD-10-CM | POA: Diagnosis not present

## 2016-12-01 DIAGNOSIS — R0609 Other forms of dyspnea: Secondary | ICD-10-CM

## 2016-12-01 DIAGNOSIS — J9611 Chronic respiratory failure with hypoxia: Secondary | ICD-10-CM

## 2016-12-01 DIAGNOSIS — Z87891 Personal history of nicotine dependence: Secondary | ICD-10-CM

## 2016-12-01 DIAGNOSIS — J449 Chronic obstructive pulmonary disease, unspecified: Secondary | ICD-10-CM

## 2016-12-01 MED ORDER — AZITHROMYCIN 250 MG PO TABS: 250.0000 mg | ORAL_TABLET | Freq: Every day | ORAL | 3 refills | Status: DC

## 2016-12-01 MED ORDER — AZITHROMYCIN 250 MG PO TABS: 250.0000 mg | ORAL_TABLET | Freq: Every day | ORAL | 10 refills | Status: DC

## 2016-12-01 NOTE — Patient Instructions (Signed)
Continue Symbicort, Spiriva, albuterol as currently prescribed Continue oxygen therapy as currently using Continue CPAP at night Lung function measurements have been ordered (PFTs) Referral to pulmonary rehabilitation program Add azithromycin 250 mg daily

## 2016-12-07 ENCOUNTER — Ambulatory Visit: Payer: Medicare HMO | Attending: Pulmonary Disease

## 2016-12-07 ENCOUNTER — Telehealth (HOSPITAL_COMMUNITY): Payer: Self-pay

## 2016-12-07 NOTE — Telephone Encounter (Signed)
Patient is diagnosed with COPD Gold 4 - No PFT attached. Will call Dr.Simonds to place new order.

## 2016-12-07 NOTE — Telephone Encounter (Signed)
Verified insurance. $15 co-pay, no deductible, out of pocket amount is $5,900/$2,291.24 has been met, no co-insurance, and no pre-authorization is required. Reference # [id number] °

## 2016-12-09 NOTE — Telephone Encounter (Signed)
Spoke with patient - She is interested in Pulmonary Rehab. I stated to her that before she can begin the Pulmonary program she will need to call Dr.Simonds to reschedule her PFT. Once her PFT has been completed I will then contact her to schedule orientation.

## 2016-12-10 ENCOUNTER — Encounter: Payer: Self-pay | Admitting: Pulmonary Disease

## 2016-12-10 NOTE — Progress Notes (Signed)
PULMONARY POST HOSPITAL FOLLOW UP NOTE   PT PROFILE: 61 y.o. female initially met in hospital when she was admitted 11/01/16 with acute on chronic respiratory failure due to AECOPD requiring noninvasive ventilation. She was discharged to home 11/04/16. She was initially diagnosed with COPD in 2004 [place], [place]). She quit smoking in 2004. She has been on oxygen therapy since 2013. She also has a diagnosis of obstructive sleep apnea. At her baseline, she has class III/IV dyspnea. She wears 2 L nasal cannula with exertion. She is on CPAP for sleep apnea. She reports orthopnea.  DATA:  08/01/15 LDCT: mild diffuse bronchial wall thickening with moderate centrilobular and mild paraseptal emphysema. Imaging findings suggestive of underlying COPD. No suspicious nodules or masses.    SUBJ:  As above. She was discharged home on a course of antibiotics and prednisone. He is maintained on Symbicort, Spiriva and albuterol rescue inhaler. She states that she uses her albuterol up to 8 or 9 times per day. She believes that she is approximately 75% back to her baseline.   She has never had PFTs performed in this community.  Vitals:   12/01/16 1120 12/01/16 1123  BP:  126/88  Pulse:  79  Resp: 16   SpO2:  97%  Weight: 105.2 kg (232 lb)   Height: _0  (1.626 m)     EXAM:  Gen: obese, No overt respiratory distress HEENT: NCAT, sclera white, oropharynx normal Neck: Supple without LAN, thyromegaly, JVP cannot be visualized Lungs: breath sounds diminished throughout without wheezes or other adventitious sounds Cardiovascular: distant at bedtime, regular, no murmurs noted Abdomen: obese, soft, nontender, normal BS Ext: without clubbing, cyanosis, edema Neuro: CNs grossly intact, motor and sensory intact Skin: Limited exam, no lesions noted  DATA:   BMP Latest Ref Rng & Units 11/02/2016 11/01/2016  Glucose 65 - 99 mg/dL 299(H) 175(H)  BUN 6 - 20 mg/dL 16 15  Creatinine 0.44 - 1.00 mg/dL 1.04(H)  1.16(H)  Sodium 135 - 145 mmol/L 142 142  Potassium 3.5 - 5.1 mmol/L 3.9 3.7  Chloride 101 - 111 mmol/L 103 104  CO2 22 - 32 mmol/L 31 31  Calcium 8.9 - 10.3 mg/dL 8.7(L) 9.5    CBC Latest Ref Rng & Units 11/02/2016 11/01/2016  WBC 3.6 - 11.0 K/uL 6.4 8.4  Hemoglobin 12.0 - 16.0 g/dL 15.9 16.7(H)  Hematocrit 35.0 - 47.0 % 50.6(H) 52.6(H)  Platelets 150 - 440 K/uL 161 170    CXR (11/01/16):  No acute cardiac or pulmonary findings  IMPRESSION:     ICD-10-CM   1. COPD, very severe ([place]) J44.9 AMB referral to pulmonary rehabilitation    Pulmonary Function Test [facility] Only  2. Chronic respiratory failure with hypoxia (HCC) J96.11   3. Former smoker Z87.891   4. OSA (obstructive sleep apnea) G47.33   5. Moderate obesity E66.8   6. Severe DOE - multifactorial R06.09 Pulmonary Function Test [facility] Only     PLAN:  Continue Symbicort, Spiriva, albuterol as currently prescribed Continue oxygen therapy as currently using Continue CPAP at night PFTs ordered Referral to pulmonary rehabilitation program Add azithromycin 250 mg daily for its anti-inflammatory effect Follow-up in 3 months   Merton Border, MD PCCM service Mobile [phone] Pager [phone] 12/10/2016 3:24 PM

## 2016-12-13 NOTE — Telephone Encounter (Signed)
Pt has been scheduled for PFT on 12/23/16 at 3:30 at [facility].  Pt is aware of appointment and instructions have been mailed to patient.  Rhonda J Cobb

## 2016-12-23 ENCOUNTER — Ambulatory Visit: Payer: Medicare HMO | Attending: Pulmonary Disease

## 2016-12-23 DIAGNOSIS — R0609 Other forms of dyspnea: Secondary | ICD-10-CM | POA: Insufficient documentation

## 2016-12-23 DIAGNOSIS — F17210 Nicotine dependence, cigarettes, uncomplicated: Secondary | ICD-10-CM | POA: Insufficient documentation

## 2016-12-23 DIAGNOSIS — J449 Chronic obstructive pulmonary disease, unspecified: Secondary | ICD-10-CM | POA: Diagnosis not present

## 2016-12-23 MED ORDER — ALBUTEROL SULFATE (2.5 MG/3ML) 0.083% IN NEBU
2.5000 mg | INHALATION_SOLUTION | Freq: Once | RESPIRATORY_TRACT | Status: AC
  Administered 2016-12-23: 2.5 mg via RESPIRATORY_TRACT
  Filled 2016-12-23: qty 3

## 2016-12-29 NOTE — Telephone Encounter (Signed)
Attempted to call patient in regards to Pulmonary rehab - [facility]

## 2017-01-05 ENCOUNTER — Telehealth (HOSPITAL_COMMUNITY): Payer: Self-pay

## 2017-01-05 NOTE — Telephone Encounter (Signed)
2nd attempt to call patient in regards to Pulmonary Rehab - [facility]. Sent letter. °

## 2017-01-12 ENCOUNTER — Telehealth (HOSPITAL_COMMUNITY): Payer: Self-pay

## 2017-01-12 NOTE — Telephone Encounter (Signed)
3rd attempt to call patient in regards to Pulmonary Rehab - Lm on Vm °

## 2017-01-19 ENCOUNTER — Telehealth (HOSPITAL_COMMUNITY): Payer: Self-pay

## 2017-01-19 NOTE — Telephone Encounter (Signed)
Have not received any responses from patient in regards to Pulmonary Rehab - Closed referral.

## 2017-02-07 ENCOUNTER — Other Ambulatory Visit: Payer: Self-pay

## 2017-02-07 ENCOUNTER — Inpatient Hospital Stay
Admission: EM | Admit: 2017-02-07 | Discharge: 2017-02-12 | DRG: 190 | Disposition: A | Discharge status: Home Health | Payer: Medicare HMO | Attending: Internal Medicine | Admitting: Internal Medicine

## 2017-02-07 ENCOUNTER — Emergency Department: Payer: Medicare HMO

## 2017-02-07 DIAGNOSIS — Z7982 Long term (current) use of aspirin: Secondary | ICD-10-CM

## 2017-02-07 DIAGNOSIS — Z8249 Family history of ischemic heart disease and other diseases of the circulatory system: Secondary | ICD-10-CM | POA: Diagnosis not present

## 2017-02-07 DIAGNOSIS — I10 Essential (primary) hypertension: Secondary | ICD-10-CM | POA: Diagnosis present

## 2017-02-07 DIAGNOSIS — J9602 Acute respiratory failure with hypercapnia: Secondary | ICD-10-CM

## 2017-02-07 DIAGNOSIS — Z825 Family history of asthma and other chronic lower respiratory diseases: Secondary | ICD-10-CM | POA: Diagnosis not present

## 2017-02-07 DIAGNOSIS — Z79899 Other long term (current) drug therapy: Secondary | ICD-10-CM

## 2017-02-07 DIAGNOSIS — J9621 Acute and chronic respiratory failure with hypoxia: Secondary | ICD-10-CM | POA: Diagnosis present

## 2017-02-07 DIAGNOSIS — I340 Nonrheumatic mitral (valve) insufficiency: Secondary | ICD-10-CM | POA: Diagnosis not present

## 2017-02-07 DIAGNOSIS — E1165 Type 2 diabetes mellitus with hyperglycemia: Secondary | ICD-10-CM | POA: Diagnosis present

## 2017-02-07 DIAGNOSIS — J441 Chronic obstructive pulmonary disease with (acute) exacerbation: Secondary | ICD-10-CM | POA: Diagnosis present

## 2017-02-07 DIAGNOSIS — J9622 Acute and chronic respiratory failure with hypercapnia: Secondary | ICD-10-CM | POA: Diagnosis present

## 2017-02-07 DIAGNOSIS — Z87891 Personal history of nicotine dependence: Secondary | ICD-10-CM

## 2017-02-07 DIAGNOSIS — Z7951 Long term (current) use of inhaled steroids: Secondary | ICD-10-CM

## 2017-02-07 DIAGNOSIS — G4733 Obstructive sleep apnea (adult) (pediatric): Secondary | ICD-10-CM | POA: Diagnosis present

## 2017-02-07 DIAGNOSIS — Z833 Family history of diabetes mellitus: Secondary | ICD-10-CM | POA: Diagnosis not present

## 2017-02-07 DIAGNOSIS — Z9981 Dependence on supplemental oxygen: Secondary | ICD-10-CM

## 2017-02-07 DIAGNOSIS — Z8673 Personal history of transient ischemic attack (TIA), and cerebral infarction without residual deficits: Secondary | ICD-10-CM | POA: Diagnosis not present

## 2017-02-07 DIAGNOSIS — G40909 Epilepsy, unspecified, not intractable, without status epilepticus: Secondary | ICD-10-CM | POA: Diagnosis present

## 2017-02-07 DIAGNOSIS — Z888 Allergy status to other drugs, medicaments and biological substances status: Secondary | ICD-10-CM | POA: Diagnosis not present

## 2017-02-07 DIAGNOSIS — Z794 Long term (current) use of insulin: Secondary | ICD-10-CM | POA: Diagnosis not present

## 2017-02-07 DIAGNOSIS — Z6839 Body mass index (BMI) 39.0-39.9, adult: Secondary | ICD-10-CM | POA: Diagnosis not present

## 2017-02-07 DIAGNOSIS — R0602 Shortness of breath: Secondary | ICD-10-CM | POA: Diagnosis not present

## 2017-02-07 DIAGNOSIS — J9601 Acute respiratory failure with hypoxia: Secondary | ICD-10-CM

## 2017-02-07 DIAGNOSIS — E785 Hyperlipidemia, unspecified: Secondary | ICD-10-CM | POA: Diagnosis present

## 2017-02-07 LAB — CBC WITH DIFFERENTIAL/PLATELET
Basophils Absolute: 0 10*3/uL (ref 0–0.1)
Basophils Relative: 0 %
EOS ABS: 0.1 10*3/uL (ref 0–0.7)
EOS PCT: 1 %
HCT: 54.1 % — ABNORMAL HIGH (ref 35.0–47.0)
Hemoglobin: 17.1 g/dL — ABNORMAL HIGH (ref 12.0–16.0)
LYMPHS ABS: 1.9 10*3/uL (ref 1.0–3.6)
Lymphocytes Relative: 22 %
MCH: 30.1 pg (ref 26.0–34.0)
MCHC: 31.6 g/dL — AB (ref 32.0–36.0)
MCV: 95.2 fL (ref 80.0–100.0)
MONOS PCT: 7 %
Monocytes Absolute: 0.6 10*3/uL (ref 0.2–0.9)
Neutro Abs: 6 10*3/uL (ref 1.4–6.5)
Neutrophils Relative %: 70 %
PLATELETS: 111 10*3/uL — AB (ref 150–440)
RBC: 5.68 MIL/uL — ABNORMAL HIGH (ref 3.80–5.20)
RDW: 16 % — ABNORMAL HIGH (ref 11.5–14.5)
WBC: 8.7 10*3/uL (ref 3.6–11.0)

## 2017-02-07 LAB — GLUCOSE, CAPILLARY
GLUCOSE-CAPILLARY: 252 mg/dL — AB (ref 65–99)
GLUCOSE-CAPILLARY: 327 mg/dL — AB (ref 65–99)
Glucose-Capillary: 218 mg/dL — ABNORMAL HIGH (ref 65–99)

## 2017-02-07 LAB — COMPREHENSIVE METABOLIC PANEL
ALBUMIN: 3.9 g/dL (ref 3.5–5.0)
ALT: 42 U/L (ref 14–54)
AST: 37 U/L (ref 15–41)
Alkaline Phosphatase: 109 U/L (ref 38–126)
Anion gap: 8 (ref 5–15)
BUN: 17 mg/dL (ref 6–20)
CHLORIDE: 102 mmol/L (ref 101–111)
CO2: 29 mmol/L (ref 22–32)
CREATININE: 1.24 mg/dL — AB (ref 0.44–1.00)
Calcium: 9.2 mg/dL (ref 8.9–10.3)
GFR calc Af Amer: 54 mL/min — ABNORMAL LOW (ref >60)
GFR, EST NON AFRICAN AMERICAN: 46 mL/min — AB (ref >60)
GLUCOSE: 190 mg/dL — AB (ref 65–99)
Potassium: 4.3 mmol/L (ref 3.5–5.1)
Sodium: 139 mmol/L (ref 135–145)
Total Bilirubin: 0.9 mg/dL (ref 0.3–1.2)
Total Protein: 7.2 g/dL (ref 6.5–8.1)

## 2017-02-07 LAB — BLOOD GAS, VENOUS
Acid-Base Excess: 1.2 mmol/L (ref 0.0–2.0)
BICARBONATE: 31 mmol/L — AB (ref 20.0–28.0)
FIO2: 1
O2 Saturation: 89.8 %
PH VEN: 7.26 (ref 7.250–7.430)
Patient temperature: 37
pCO2, Ven: 69 mmHg — ABNORMAL HIGH (ref 44.0–60.0)
pO2, Ven: 67 mmHg — ABNORMAL HIGH (ref 32.0–45.0)

## 2017-02-07 LAB — MAGNESIUM: Magnesium: 2 mg/dL (ref 1.7–2.4)

## 2017-02-07 LAB — TROPONIN I

## 2017-02-07 LAB — BRAIN NATRIURETIC PEPTIDE: B Natriuretic Peptide: 429 pg/mL — ABNORMAL HIGH (ref 0.0–100.0)

## 2017-02-07 MED ORDER — ONDANSETRON HCL 4 MG PO TABS: 4.0000 mg | ORAL_TABLET | Freq: Four times a day (QID) | ORAL | Status: DC | PRN

## 2017-02-07 MED ORDER — GUAIFENESIN-DM 100-10 MG/5ML PO SYRP
5.0000 mL | ORAL_SOLUTION | ORAL | Status: DC | PRN
  Administered 2017-02-08 – 2017-02-09 (×2): 5 mL via ORAL
  Filled 2017-02-07 (×2): qty 5

## 2017-02-07 MED ORDER — IPRATROPIUM-ALBUTEROL 0.5-2.5 (3) MG/3ML IN SOLN
3.0000 mL | Freq: Once | RESPIRATORY_TRACT | Status: AC
  Administered 2017-02-07: 3 mL via RESPIRATORY_TRACT
  Filled 2017-02-07: qty 3

## 2017-02-07 MED ORDER — METHYLPREDNISOLONE SODIUM SUCC 125 MG IJ SOLR
60.0000 mg | Freq: Four times a day (QID) | INTRAMUSCULAR | Status: DC
  Administered 2017-02-07 – 2017-02-08 (×5): 60 mg via INTRAVENOUS
  Filled 2017-02-07 (×5): qty 2

## 2017-02-07 MED ORDER — SODIUM CHLORIDE 0.9 % IV BOLUS (SEPSIS)
1000.0000 mL | Freq: Once | INTRAVENOUS | Status: AC
  Administered 2017-02-07: 1000 mL via INTRAVENOUS

## 2017-02-07 MED ORDER — ACETAMINOPHEN 650 MG RE SUPP
650.0000 mg | Freq: Four times a day (QID) | RECTAL | Status: DC | PRN
Start: 2017-02-07 — End: 2017-02-12

## 2017-02-07 MED ORDER — BUDESONIDE-FORMOTEROL FUMARATE 160-4.5 MCG/ACT IN AERO
2.0000 | INHALATION_SPRAY | Freq: Two times a day (BID) | RESPIRATORY_TRACT | Status: DC
  Administered 2017-02-07 – 2017-02-12 (×10): 2 via RESPIRATORY_TRACT
  Filled 2017-02-07: qty 6

## 2017-02-07 MED ORDER — MOMETASONE FURO-FORMOTEROL FUM 200-5 MCG/ACT IN AERO
2.0000 | INHALATION_SPRAY | Freq: Two times a day (BID) | RESPIRATORY_TRACT | Status: DC
  Filled 2017-02-07: qty 8.8

## 2017-02-07 MED ORDER — DEXTROSE 5 % IV SOLN
500.0000 mg | Freq: Once | INTRAVENOUS | Status: AC
  Administered 2017-02-07: 500 mg via INTRAVENOUS
  Filled 2017-02-07: qty 500

## 2017-02-07 MED ORDER — ENOXAPARIN SODIUM 40 MG/0.4ML ~~LOC~~ SOLN
40.0000 mg | SUBCUTANEOUS | Status: DC
  Administered 2017-02-07 – 2017-02-12 (×6): 40 mg via SUBCUTANEOUS
  Filled 2017-02-07 (×7): qty 0.4

## 2017-02-07 MED ORDER — SODIUM CHLORIDE 0.9% FLUSH
3.0000 mL | Freq: Two times a day (BID) | INTRAVENOUS | Status: DC
  Administered 2017-02-07 – 2017-02-12 (×11): 3 mL via INTRAVENOUS

## 2017-02-07 MED ORDER — LAMOTRIGINE 100 MG PO TABS
200.0000 mg | ORAL_TABLET | Freq: Two times a day (BID) | ORAL | Status: DC
  Administered 2017-02-07 – 2017-02-12 (×11): 200 mg via ORAL
  Filled 2017-02-07 (×11): qty 2

## 2017-02-07 MED ORDER — METHYLPREDNISOLONE SODIUM SUCC 125 MG IJ SOLR
125.0000 mg | Freq: Once | INTRAMUSCULAR | Status: AC
  Administered 2017-02-07: 125 mg via INTRAVENOUS
  Filled 2017-02-07: qty 2

## 2017-02-07 MED ORDER — ALBUTEROL SULFATE (2.5 MG/3ML) 0.083% IN NEBU: 2.5000 mg | INHALATION_SOLUTION | RESPIRATORY_TRACT | Status: DC | PRN

## 2017-02-07 MED ORDER — HYDROCHLOROTHIAZIDE 25 MG PO TABS
25.0000 mg | ORAL_TABLET | Freq: Every day | ORAL | Status: DC
  Administered 2017-02-07 – 2017-02-11 (×5): 25 mg via ORAL
  Filled 2017-02-07 (×5): qty 1

## 2017-02-07 MED ORDER — INSULIN ASPART 100 UNIT/ML ~~LOC~~ SOLN
0.0000 [IU] | Freq: Three times a day (TID) | SUBCUTANEOUS | Status: DC
  Administered 2017-02-07: 7 [IU] via SUBCUTANEOUS
  Administered 2017-02-07: 5 [IU] via SUBCUTANEOUS
  Administered 2017-02-08: 2 [IU] via SUBCUTANEOUS
  Administered 2017-02-08: 7 [IU] via SUBCUTANEOUS
  Filled 2017-02-07 (×4): qty 1

## 2017-02-07 MED ORDER — PREGABALIN 75 MG PO CAPS
75.0000 mg | ORAL_CAPSULE | Freq: Two times a day (BID) | ORAL | Status: DC
  Administered 2017-02-07 – 2017-02-12 (×11): 75 mg via ORAL
  Filled 2017-02-07 (×11): qty 1

## 2017-02-07 MED ORDER — IPRATROPIUM-ALBUTEROL 0.5-2.5 (3) MG/3ML IN SOLN
3.0000 mL | Freq: Four times a day (QID) | RESPIRATORY_TRACT | Status: DC
  Administered 2017-02-08 – 2017-02-09 (×7): 3 mL via RESPIRATORY_TRACT
  Filled 2017-02-07 (×7): qty 3

## 2017-02-07 MED ORDER — TIOTROPIUM BROMIDE MONOHYDRATE 18 MCG IN CAPS
18.0000 ug | ORAL_CAPSULE | Freq: Every morning | RESPIRATORY_TRACT | Status: DC
  Administered 2017-02-07 – 2017-02-10 (×4): 18 ug via RESPIRATORY_TRACT
  Filled 2017-02-07: qty 5

## 2017-02-07 MED ORDER — ACETAMINOPHEN 325 MG PO TABS
650.0000 mg | ORAL_TABLET | Freq: Four times a day (QID) | ORAL | Status: DC | PRN
  Administered 2017-02-07 – 2017-02-09 (×2): 650 mg via ORAL
  Filled 2017-02-07 (×2): qty 2

## 2017-02-07 MED ORDER — DILTIAZEM HCL ER 60 MG PO CP12
60.0000 mg | ORAL_CAPSULE | Freq: Every day | ORAL | Status: DC
  Administered 2017-02-07 – 2017-02-11 (×5): 60 mg via ORAL
  Filled 2017-02-07 (×7): qty 1

## 2017-02-07 MED ORDER — ATORVASTATIN CALCIUM 20 MG PO TABS
40.0000 mg | ORAL_TABLET | Freq: Every day | ORAL | Status: DC
  Administered 2017-02-07 – 2017-02-11 (×5): 40 mg via ORAL
  Filled 2017-02-07 (×6): qty 2

## 2017-02-07 MED ORDER — MAGNESIUM SULFATE 2 GM/50ML IV SOLN
2.0000 g | Freq: Once | INTRAVENOUS | Status: AC
  Administered 2017-02-07: 2 g via INTRAVENOUS
  Filled 2017-02-07: qty 50

## 2017-02-07 MED ORDER — FERROUS SULFATE 325 (65 FE) MG PO TABS
325.0000 mg | ORAL_TABLET | Freq: Every day | ORAL | Status: DC
  Administered 2017-02-08 – 2017-02-12 (×5): 325 mg via ORAL
  Filled 2017-02-07 (×5): qty 1

## 2017-02-07 MED ORDER — CYANOCOBALAMIN 500 MCG PO TABS
500.0000 ug | ORAL_TABLET | Freq: Every day | ORAL | Status: DC
  Administered 2017-02-07 – 2017-02-12 (×6): 500 ug via ORAL
  Filled 2017-02-07 (×6): qty 1

## 2017-02-07 MED ORDER — ASPIRIN 325 MG PO TABS
325.0000 mg | ORAL_TABLET | Freq: Every day | ORAL | Status: DC
  Administered 2017-02-07 – 2017-02-10 (×2): 325 mg via ORAL
  Filled 2017-02-07 (×6): qty 1

## 2017-02-07 MED ORDER — INSULIN ASPART 100 UNIT/ML ~~LOC~~ SOLN
0.0000 [IU] | Freq: Every day | SUBCUTANEOUS | Status: DC
  Administered 2017-02-08: 2 [IU] via SUBCUTANEOUS
  Administered 2017-02-09: 4 [IU] via SUBCUTANEOUS
  Administered 2017-02-11: 3 [IU] via SUBCUTANEOUS
  Filled 2017-02-07 (×3): qty 1

## 2017-02-07 MED ORDER — IPRATROPIUM-ALBUTEROL 0.5-2.5 (3) MG/3ML IN SOLN
3.0000 mL | Freq: Four times a day (QID) | RESPIRATORY_TRACT | Status: DC
  Administered 2017-02-07 (×2): 3 mL via RESPIRATORY_TRACT
  Filled 2017-02-07 (×2): qty 3

## 2017-02-07 MED ORDER — HYDROCODONE-ACETAMINOPHEN 5-325 MG PO TABS
1.0000 | ORAL_TABLET | ORAL | Status: DC | PRN
  Administered 2017-02-08 – 2017-02-09 (×3): 1 via ORAL
  Administered 2017-02-10: 2 via ORAL
  Filled 2017-02-07: qty 2
  Filled 2017-02-07 (×3): qty 1

## 2017-02-07 MED ORDER — ONDANSETRON HCL 4 MG/2ML IJ SOLN: 4.0000 mg | Freq: Four times a day (QID) | INTRAMUSCULAR | Status: DC | PRN

## 2017-02-07 MED ORDER — SODIUM CHLORIDE 0.9% FLUSH: 3.0000 mL | INTRAVENOUS | Status: DC | PRN

## 2017-02-07 MED ORDER — POTASSIUM 99 MG PO TABS: 99.0000 mg | ORAL_TABLET | Freq: Every day | ORAL | Status: DC

## 2017-02-07 MED ORDER — LOSARTAN POTASSIUM 50 MG PO TABS
50.0000 mg | ORAL_TABLET | Freq: Every day | ORAL | Status: DC
  Administered 2017-02-07 – 2017-02-12 (×6): 50 mg via ORAL
  Filled 2017-02-07 (×6): qty 1

## 2017-02-07 MED ORDER — SENNOSIDES-DOCUSATE SODIUM 8.6-50 MG PO TABS: 1.0000 | ORAL_TABLET | Freq: Every evening | ORAL | Status: DC | PRN

## 2017-02-07 MED ORDER — SODIUM CHLORIDE 0.9 % IV SOLN: 250.0000 mL | INTRAVENOUS | Status: DC | PRN

## 2017-02-07 MED ORDER — INSULIN GLARGINE 100 UNIT/ML ~~LOC~~ SOLN
15.0000 [IU] | Freq: Every day | SUBCUTANEOUS | Status: DC
  Administered 2017-02-07: 15 [IU] via SUBCUTANEOUS
  Filled 2017-02-07 (×2): qty 0.15

## 2017-02-07 MED ORDER — TOPIRAMATE 100 MG PO TABS
200.0000 mg | ORAL_TABLET | Freq: Two times a day (BID) | ORAL | Status: DC
  Administered 2017-02-07 – 2017-02-12 (×11): 200 mg via ORAL
  Filled 2017-02-07 (×12): qty 2

## 2017-02-07 MED ORDER — BISACODYL 5 MG PO TBEC
5.0000 mg | DELAYED_RELEASE_TABLET | Freq: Every day | ORAL | Status: DC | PRN
  Administered 2017-02-09: 5 mg via ORAL
  Filled 2017-02-07: qty 1

## 2017-02-07 MED ORDER — IPRATROPIUM-ALBUTEROL 0.5-2.5 (3) MG/3ML IN SOLN
3.0000 mL | RESPIRATORY_TRACT | Status: DC | PRN
  Administered 2017-02-08: 3 mL via RESPIRATORY_TRACT
  Filled 2017-02-07: qty 3

## 2017-02-07 MED ORDER — NIACIN ER 500 MG PO CPCR
500.0000 mg | ORAL_CAPSULE | Freq: Every day | ORAL | Status: DC
  Administered 2017-02-07 – 2017-02-10 (×4): 500 mg via ORAL
  Filled 2017-02-07 (×5): qty 1

## 2017-02-07 NOTE — ED Notes (Signed)
I walked out front to check on the ride for another pt when this pt was noted to be walking toward the front door; stopped at the pillar by the ED entrance and grabbed it; got patient a wheelchair; while arriving pt into system oxygen sats noted to be 76%; pt says she has a history of COPD and is supposed to be wearing oxygen but "It's not helping"; pt arrived wearing no oxygen; talking in short sentences only; taken to treatment room 26 via wheelchair; Dr Lamont Snowball notified of pt's arrival and oxygen saturation

## 2017-02-07 NOTE — ED Provider Notes (Signed)
[facility] Emergency Department Provider Note  ____________________________________________   First MD Initiated Contact with Patient 02/07/17 [phone]     (approximate)  I have reviewed the triage vital signs and the nursing notes.   HISTORY  Chief Complaint Respiratory Distress  Level 5 exemption history limited by the patient's clinical condition  HPI Brenda Wu is a 61 y.o. female who self presents to the emergency department with roughly 48 hours of progressive severe shortness of breath.  She has a history of COPD and is required hospitalization multiple times.  She is oxygen dependent at home with 2 L.  She has had increasingly productive cough recently.  She reports a family history of hypertension.  Past Medical History:  Diagnosis Date  . Diabetes mellitus without complication (HCC)   . Hypercholesteremia   . Hypertension   . Seizures [facility])     Patient Active Problem List   Diagnosis Date Noted  . Acute on chronic respiratory failure with hypoxia and hypercapnia (HCC) 11/01/2016  . COPD with acute exacerbation (HCC) 03/12/2016  . History of cerebellar stroke 03/12/2016  . History of seizures 03/12/2016  . Pulmonary nodule, left 01/05/2016  . Paroxysmal atrial fibrillation (HCC) 10/17/2015  . Cerebellar stroke (HCC) 06/26/2015  . Transient diplopia 06/09/2015  . Asthma-chronic obstructive pulmonary disease overlap syndrome (HCC) 05/30/2015  . COPD with asthma (HCC) 05/30/2015  . Hypoxemia 05/30/2015  . Obstructive sleep apnea syndrome 05/30/2015  . OSA on CPAP 05/30/2015  . Obesity (BMI 30-39.9) 03/12/2015  . Bilateral hearing loss 01/22/2015  . Generalized seizure disorder (HCC) 01/22/2015  . Hyperlipidemia associated with type 2 diabetes mellitus (HCC) 01/22/2015  . Hypertension associated with diabetes (HCC) 01/22/2015  . Morbid obesity due to excess calories (HCC) 01/22/2015  . Primary osteoarthritis involving multiple joints  01/22/2015  . Severe episode of recurrent major depressive disorder, without psychotic features (HCC) 01/22/2015  . Type 2 diabetes mellitus without complication, without long-term current use of insulin (HCC) 01/22/2015    History reviewed. No pertinent surgical history.  Prior to Admission medications   Medication Sig Start Date End Date Taking? Authorizing Provider  albuterol (PROVENTIL HFA;VENTOLIN HFA) 108 (90 Base) MCG/ACT inhaler Inhale 2 puffs into the lungs 4 (four) times daily as needed. 01/05/16   [provider]  albuterol (PROVENTIL) (2.5 MG/3ML) 0.083% nebulizer solution Inhale 2.5 mg into the lungs every 6 (six) hours as needed. 10/01/16   [provider]  aspirin (GOODSENSE ASPIRIN) 325 MG tablet Take 325 mg by mouth daily. 01/31/08   [provider]  atorvastatin (LIPITOR) 40 MG tablet Take 40 mg by mouth daily.    [provider]  budesonide-formoterol (SYMBICORT) 160-4.5 MCG/ACT inhaler Inhale 2 puffs into the lungs 2 (two) times daily. 05/22/15   [provider]  diltiazem (CARDIZEM SR) 60 MG 12 hr capsule Take 60 mg by mouth daily.     [provider]  ferrous sulfate 325 (65 FE) MG tablet Take 325 mg by mouth daily with breakfast.    [provider]  glipiZIDE (GLUCOTROL) 5 MG tablet Take by mouth 2 (two) times daily before a meal.     [provider]  hydrochlorothiazide (HYDRODIURIL) 25 MG tablet Take 25 mg by mouth daily.    [provider]  lamoTRIgine (LAMICTAL) 200 MG tablet Take 200 mg by mouth 2 (two) times daily.     [provider]  losartan (COZAAR) 50 MG tablet Take 50 mg by mouth daily.  [provider]  Multiple Vitamin (MULTI-VITAMINS) TABS Take 1 tablet by mouth daily.    [provider]  niacin 500 MG CR capsule Take 500 mg by mouth daily. 08/15/15   [provider]  Potassium 99 MG TABS Take 99 mg by mouth daily.    [provider]    sitaGLIPtin (JANUVIA) 50 MG tablet Take 50 mg by mouth daily.    [provider]  tiotropium (SPIRIVA) 18 MCG inhalation capsule Place 18 mcg into inhaler and inhale daily.    [provider]  topiramate (TOPAMAX) 200 MG tablet Take 200 mg by mouth 2 (two) times daily.    [provider]  vitamin B-12 (CYANOCOBALAMIN) 500 MCG tablet Take 500 mcg by mouth daily.    [provider]    Allergies Metformin and Bismuth subsalicylate  Family History  Problem Relation Age of Onset  . Hypertension Father   . Asthma Sister   . Heart murmur Sister   . Diabetes Sister     Social History Social History   Tobacco Use  . Smoking status: Former Smoker    Packs/day: 1.00    Years: 30.00    Pack years: 30.00    Types: Cigarettes  . Smokeless tobacco: Never Used  Substance Use Topics  . Alcohol use: No  . Drug use: No    Review of Systems Level 5 exemption history limited by the patient's clinical condition _________________   PHYSICAL EXAM:  VITAL SIGNS: ED Triage Vitals [02/07/17 0438]  Enc Vitals Group     BP      Pulse Rate 98     Resp      Temp      Temp src      SpO2 (!) 76 %     Weight      Height      Head Circumference      Peak Flow      Pain Score      Pain Loc      Pain Edu?      Excl. in GC?     Constitutional: Moderate respiratory distress speaking in 2-3 word sentences with audible wheezes Eyes: PERRL EOMI. Head: Atraumatic. Nose: No congestion/rhinnorhea. Mouth/Throat: No trismus Neck: No stridor.   Cardiovascular: Normal rate, regular rhythm. Grossly normal heart sounds.  Good peripheral circulation. Respiratory: Increased respiratory effort using accessory muscles rhonchi throughout all lung fields with some wheeze overlying moving limited amounts of air Gastrointestinal: Soft nontender Musculoskeletal: No lower extremity edema   Neurologic:  . No gross focal neurologic deficits are appreciated. Skin:  Skin is  warm, dry and intact. No rash noted. Psychiatric: Anxious appearing    ____________________________________________   DIFFERENTIAL includes but not limited to  COPD exacerbation, pneumothorax, lobar collapse, pulmonary embolism, pneumonia ____________________________________________   LABS (all labs ordered are listed, but only abnormal results are displayed)  Labs Reviewed  BLOOD GAS, VENOUS - Abnormal; Notable for the following components:      Result Value   pCO2, Ven 69 (*)    pO2, Ven 67.0 (*)    Bicarbonate 31.0 (*)    All other components within normal limits  COMPREHENSIVE METABOLIC PANEL - Abnormal; Notable for the following components:   Glucose, Bld 190 (*)    Creatinine, Ser 1.24 (*)    GFR calc non Af Amer 46 (*)    GFR calc Af Amer 54 (*)    All other components within normal limits  CBC WITH  DIFFERENTIAL/PLATELET - Abnormal; Notable for the following components:   RBC 5.68 (*)    Hemoglobin 17.1 (*)    HCT 54.1 (*)    MCHC 31.6 (*)    RDW 16.0 (*)    Platelets 111 (*)    All other components within normal limits  BRAIN NATRIURETIC PEPTIDE - Abnormal; Notable for the following components:   B Natriuretic Peptide 429.0 (*)    All other components within normal limits  TROPONIN I    Blood work reviewed by me shows acidemia and hypercapnia __________________________________________  EKG  ED ECG REPORT I, Merrily BrittleNeil Emitt Maglione, the attending physician, personally viewed and interpreted this ECG.  Date: 02/07/2017 EKG Time:  Rate: 95 Rhythm: normal sinus rhythm QRS Axis: normal Intervals: normal ST/T Wave abnormalities: normal Narrative Interpretation: no evidence of acute ischemia  ____________________________________________  RADIOLOGY  Chest x-ray reviewed by me with chronic changes but no acute disease ____________________________________________   PROCEDURES  Procedure(s) performed: no  Procedures  Critical Care performed:  no  Observation: yes  ----------------------------------------- 4:43 AM on 02/07/2017 -----------------------------------------   OBSERVATION CARE: This patient is being placed under observation care for the following reasons: The patient has a COPD exacerbation requires observation to evaluate her response to treatment and the need for inpatient management versus outpatient     ____________________________________________   INITIAL IMPRESSION / ASSESSMENT AND PLAN / ED COURSE  Pertinent labs & imaging results that were available during my care of the patient were reviewed by me and considered in my medical decision making (see chart for details).  On arrival the patient has increased work of breathing looks uncomfortable and is saturating in the 60s-70s on room air.  On 15 L facemask he comes up nicely to 95%.  3 duo nebs along with magnesium sodium medicine now along with fluids for likely dehydration.     ----------------------------------------- 5:04 AM on 02/07/2017 -----------------------------------------  Now saturating 99% and appears much more comfortable using her nebulizer. ____________________________________________   ----------------------------------------- 6:22 AM on 02/07/2017 -----------------------------------------   END OF OBSERVATION STATUS: After an appropriate period of observation, this patient is being admitted due to the following reason(s): I ambulated the patient up and down the halls off oxygen and when she returned she was saturating 74%.  This is after 5 breathing treatment steroids magnesium.  At this point she requires inpatient admission for further bronchodilators and likely steroids around-the-clock.  The patient verbalized understanding and agreement with the plan.  I discussed with the hospitalist Dr. Sheryle Haildiamond who is graciously agreed to admit the patient to his service.   FINAL CLINICAL IMPRESSION(S) / ED DIAGNOSES  Final  diagnoses:  Acute respiratory failure with hypoxia and hypercarbia (HCC)  COPD exacerbation (HCC)      NEW MEDICATIONS STARTED DURING THIS VISIT:  This SmartLink is deprecated. Use AVSMEDLIST instead to display the medication list for a patient.   Note:  This document was prepared using Dragon voice recognition software and may include unintentional dictation errors.     Merrily Brittleifenbark, Neshia Mckenzie, MD 02/07/17 [phone]

## 2017-02-07 NOTE — ED Notes (Signed)
Pt ambulated by MD Riffenbark with sat at 74%

## 2017-02-07 NOTE — ED Notes (Signed)
O2 via NRB decreased from 15 liters to 10 liters. sats 100% at this time

## 2017-02-07 NOTE — ED Triage Notes (Addendum)
Pt comes from home with c/o SHOB. Pt current O2 66%. Pt states she wears 2 L at home. Pt states she was at home and noticed that her O2 sats were were dropping and in the 80s. Pt states this concerned her and made her come into the ED. Pt currently breathing fast. Pt alert and oriented and talking in complete sentences.

## 2017-02-07 NOTE — Progress Notes (Signed)
PHARMACIST - PHYSICIAN ORDER COMMUNICATION  CONCERNING: P&T Medication Policy on Herbal Medications  DESCRIPTION:  This patient's order for:  Potassium 99mg  tablet  has been noted.  This product(s) is classified as an "herbal" or natural product. Due to a lack of definitive safety studies or FDA approval, nonstandard manufacturing practices, plus the potential risk of unknown drug-drug interactions while on inpatient medications, the Pharmacy and Therapeutics Committee does not permit the use of "herbal" or natural products of this type within [facility].   ACTION TAKEN: The pharmacy department is unable to verify this order at this time. Please reevaluate patient's clinical condition at discharge and address if the herbal or natural product(s) should be resumed at that time.

## 2017-02-07 NOTE — H&P (Signed)
[facility]   PATIENT NAME: Brenda Wu    MR#:  [id number]  DATE OF BIRTH:  1956/01/02  DATE OF ADMISSION:  02/07/2017  PRIMARY CARE PHYSICIAN: Cheron SchaumannVelazquez, Gretchen Y., MD   REQUESTING/REFERRING PHYSICIAN: Merrily Brittleifenbark, Neil, MD  CHIEF COMPLAINT:   Chief Complaint  Patient presents with  . Respiratory Distress   Cough, shortness of breath and wheezing for 2 and half days. HISTORY OF PRESENT ILLNESS:  Brenda CorpusYvette Rueth  is a 61 y.o. female with a known history of COPD, chronic respiratory failure on home oxygen 2 L, hypertension, hyperlipidemia, seizure disorder, CVA, OSA and diabetes.  The patient presents the ED with above chief complaints.  Her symptoms have been worsening for the past 48 hours.  She was found hypoxia with O2 saturation at 75%.  She is put on nonrebreather, treated with nebulizer several times in ED.  PAST MEDICAL HISTORY:   Past Medical History:  Diagnosis Date  . Diabetes mellitus without complication (HCC)   . Hypercholesteremia   . Hypertension   . Seizures (HCC)     PAST SURGICAL HISTORY:  History reviewed. No pertinent surgical history.  SOCIAL HISTORY:   Social History   Tobacco Use  . Smoking status: Former Smoker    Packs/day: 1.00    Years: 30.00    Pack years: 30.00    Types: Cigarettes  . Smokeless tobacco: Never Used  Substance Use Topics  . Alcohol use: No    FAMILY HISTORY:   Family History  Problem Relation Age of Onset  . Hypertension Father   . Asthma Sister   . Heart murmur Sister   . Diabetes Sister     DRUG ALLERGIES:   Allergies  Allergen Reactions  . Metformin Other (See Comments)    Other reaction(s): Other (See Comments) Constipation, dry mouth, dizziness  . Bismuth Subsalicylate Rash    Pepto Bismol    REVIEW OF SYSTEMS:   Review of Systems  Constitutional: Positive for malaise/fatigue. Negative for chills and fever.  HENT: Negative for hearing loss and  sore throat.   Eyes: Negative for blurred vision and double vision.  Respiratory: Positive for cough, sputum production, shortness of breath and wheezing. Negative for hemoptysis and stridor.   Cardiovascular: Negative for chest pain, palpitations, orthopnea and leg swelling.  Gastrointestinal: Negative for abdominal pain, blood in stool, diarrhea, melena, nausea and vomiting.  Genitourinary: Negative for dysuria, flank pain and hematuria.  Musculoskeletal: Negative for back pain and joint pain.  Neurological: Negative for dizziness, sensory change, focal weakness, seizures, loss of consciousness, weakness and headaches.  Endo/Heme/Allergies: Negative for polydipsia.  Psychiatric/Behavioral: Negative for depression. The patient is not nervous/anxious.     MEDICATIONS AT HOME:   Prior to Admission medications   Medication Sig Start Date End Date Taking? Authorizing Provider  albuterol (PROVENTIL HFA;VENTOLIN HFA) 108 (90 Base) MCG/ACT inhaler Inhale 2 puffs into the lungs 4 (four) times daily as needed for wheezing or shortness of breath.  01/05/16  Yes [provider]  albuterol (PROVENTIL) (2.5 MG/3ML) 0.083% nebulizer solution Inhale 2.5 mg into the lungs every 6 (six) hours as needed for wheezing or shortness of breath.  10/01/16  Yes [provider]  aspirin (GOODSENSE ASPIRIN) 325 MG tablet Take 325 mg by mouth daily. 01/31/08  Yes [provider]  atorvastatin (LIPITOR) 40 MG tablet Take 40 mg by mouth daily.   Yes [provider]  budesonide-formoterol (SYMBICORT) 160-4.5 MCG/ACT inhaler Inhale 2 puffs  into the lungs 2 (two) times daily. 05/22/15  Yes [provider]  diltiazem (CARDIZEM SR) 60 MG 12 hr capsule Take 60 mg by mouth daily.    Yes [provider]  ferrous sulfate 325 (65 FE) MG tablet Take 325 mg by mouth daily with breakfast.   Yes [provider]  glipiZIDE (GLUCOTROL) 5 MG tablet Take 5 mg by mouth 2 (two)  times daily before a meal.    Yes [provider]  hydrochlorothiazide (HYDRODIURIL) 25 MG tablet Take 25 mg by mouth daily.   Yes [provider]  Insulin Glargine (LANTUS SOLOSTAR) 100 UNIT/ML Solostar Pen Inject 15 Units into the skin daily. 01/21/17  Yes [provider]  lamoTRIgine (LAMICTAL) 200 MG tablet Take 200 mg by mouth 2 (two) times daily.    Yes [provider]  losartan (COZAAR) 50 MG tablet Take 50 mg by mouth daily.   Yes [provider]  Multiple Vitamin (MULTI-VITAMINS) TABS Take 1 tablet by mouth daily.   Yes [provider]  niacin 500 MG CR capsule Take 500 mg by mouth daily. 08/15/15  Yes [provider]  Potassium 99 MG TABS Take 99 mg by mouth daily.   Yes [provider]  pregabalin (LYRICA) 75 MG capsule Take 75 mg by mouth 2 (two) times daily. 01/11/17 04/11/17 Yes [provider]  sitaGLIPtin (JANUVIA) 50 MG tablet Take 50 mg by mouth daily.   Yes [provider]  tiotropium (SPIRIVA) 18 MCG inhalation capsule Place 18 mcg into inhaler and inhale daily.   Yes [provider]  topiramate (TOPAMAX) 200 MG tablet Take 200 mg by mouth 2 (two) times daily.   Yes [provider]  vitamin B-12 (CYANOCOBALAMIN) 500 MCG tablet Take 500 mcg by mouth daily.   Yes [provider]      VITAL SIGNS:  Blood pressure 115/74, pulse 85, resp. rate (!) 27, height 5\' 4"  (1.626 m), weight 229 lb (103.9 kg), SpO2 99 %.  PHYSICAL EXAMINATION:  Physical Exam  GENERAL:  61 y.o.-year-old patient lying in the bed with no acute distress.  Obese obesity. EYES: Pupils equal, round, reactive to light and accommodation. No scleral icterus. Extraocular muscles intact.  HEENT: Head atraumatic, normocephalic. Oropharynx and nasopharynx clear.  NECK:  Supple, no jugular venous distention. No thyroid enlargement, no tenderness.  LUNGS: Diminished breath sounds bilaterally, expiratory  wheezing, no rales,rhonchi or crepitation. No use of accessory muscles of respiration.  CARDIOVASCULAR: S1, S2 normal. No murmurs, rubs, or gallops.  ABDOMEN: Soft, nontender, nondistended. Bowel sounds present. No organomegaly or mass.  EXTREMITIES: No pedal edema, cyanosis, or clubbing.  NEUROLOGIC: Cranial nerves II through XII are intact. Muscle strength 5/5 in all extremities. Sensation intact. Gait not checked.  PSYCHIATRIC: The patient is alert and oriented x 3.  SKIN: No obvious rash, lesion, or ulcer.   LABORATORY PANEL:   CBC Recent Labs  Lab 02/07/17 0442  WBC 8.7  HGB 17.1*  HCT 54.1*  PLT 111*   ------------------------------------------------------------------------------------------------------------------  Chemistries  Recent Labs  Lab 02/07/17 0442  NA 139  K 4.3  CL 102  CO2 29  GLUCOSE 190*  BUN 17  CREATININE 1.24*  CALCIUM 9.2  AST 37  ALT 42  ALKPHOS 109  BILITOT 0.9   ------------------------------------------------------------------------------------------------------------------  Cardiac Enzymes Recent Labs  Lab 02/07/17 0442  TROPONINI <0.03   ------------------------------------------------------------------------------------------------------------------  RADIOLOGY:  Dg Chest Port 1 View  Result Date: 02/07/2017 CLINICAL DATA:  Acute  onset of shortness of breath. EXAM: PORTABLE CHEST 1 VIEW COMPARISON:  11/01/2016 FINDINGS: Mild cardiomegaly. Unchanged mediastinal contours. No pulmonary edema, consolidation or pleural effusion. No pneumothorax. Stable osseous structures. IMPRESSION: Mild cardiomegaly.  No acute pulmonary process. Electronically Signed   By: Rubye Oaks M.D.   On: 02/07/2017 04:58      IMPRESSION AND PLAN:   Acute on chronic respiratory failure with hypoxia due to COPD exacerbation. The patient will be admitted to medical floor. Continue oxygen by nasal cannula 4 L for now, try to wean down to baseline 2  L. Start IV Solu-Medrol, DuoNeb every 6 hours, continue Spiriva and Dulera. Robitussin as needed.  Diabetes.  Continue Lantus and start sliding scale.  Hypertension.  Continue home hypertension medication.  Morbid obesity and OSA.  CPAP at night. Seizure disorder.  Continue home seizure medication. History of CVA.  Continue aspirin and statin.   All the records are reviewed and case discussed with ED provider. Management plans discussed with the patient, family and they are in agreement.  CODE STATUS: Full code  TOTAL TIME TAKING CARE OF THIS PATIENT: 57 minutes.    Shaune Pollack M.D on 02/07/2017 at 8:01 AM  Between 7am to 6pm - Pager - [phone]  After 6pm go to [email or web address] - Social research officer, government  [facility]  Office  [phone]  CC: Primary care physician; Cheron Schaumann., MD   Note: This dictation was prepared with Dragon dictation along with smaller phrase technology. Any transcriptional errors that result from this process are unin

## 2017-02-07 NOTE — ED Notes (Signed)
Pt placed on 4LNC. sats 96% at this time. Pt sleeping. A/o when awakened.

## 2017-02-08 LAB — GLUCOSE, CAPILLARY
GLUCOSE-CAPILLARY: 196 mg/dL — AB (ref 65–99)
GLUCOSE-CAPILLARY: 115 mg/dL — AB (ref 65–99)
Glucose-Capillary: 236 mg/dL — ABNORMAL HIGH (ref 65–99)
Glucose-Capillary: 318 mg/dL — ABNORMAL HIGH (ref 65–99)
Glucose-Capillary: 270 mg/dL — ABNORMAL HIGH (ref 65–99)

## 2017-02-08 LAB — CBC
HEMATOCRIT: 51.3 % — AB (ref 35.0–47.0)
HEMOGLOBIN: 16 g/dL (ref 12.0–16.0)
MCH: 30 pg (ref 26.0–34.0)
MCHC: 31.1 g/dL — ABNORMAL LOW (ref 32.0–36.0)
MCV: 96.3 fL (ref 80.0–100.0)
Platelets: 116 10*3/uL — ABNORMAL LOW (ref 150–440)
RBC: 5.33 MIL/uL — ABNORMAL HIGH (ref 3.80–5.20)
RDW: 15.8 % — AB (ref 11.5–14.5)
WBC: 6.3 10*3/uL (ref 3.6–11.0)

## 2017-02-08 LAB — BASIC METABOLIC PANEL
ANION GAP: 8 (ref 5–15)
BUN: 21 mg/dL — ABNORMAL HIGH (ref 6–20)
CO2: 31 mmol/L (ref 22–32)
Calcium: 8.9 mg/dL (ref 8.9–10.3)
Chloride: 101 mmol/L (ref 101–111)
Creatinine, Ser: 1.22 mg/dL — ABNORMAL HIGH (ref 0.44–1.00)
GFR calc Af Amer: 55 mL/min — ABNORMAL LOW (ref >60)
GFR, EST NON AFRICAN AMERICAN: 47 mL/min — AB (ref >60)
Glucose, Bld: 276 mg/dL — ABNORMAL HIGH (ref 65–99)
POTASSIUM: 4.3 mmol/L (ref 3.5–5.1)
SODIUM: 140 mmol/L (ref 135–145)

## 2017-02-08 MED ORDER — INSULIN ASPART 100 UNIT/ML ~~LOC~~ SOLN
0.0000 [IU] | Freq: Three times a day (TID) | SUBCUTANEOUS | Status: DC
  Administered 2017-02-08: 8 [IU] via SUBCUTANEOUS
  Administered 2017-02-09 (×2): 5 [IU] via SUBCUTANEOUS
  Administered 2017-02-10 (×2): 11 [IU] via SUBCUTANEOUS
  Administered 2017-02-11: 5 [IU] via SUBCUTANEOUS
  Administered 2017-02-11: 8 [IU] via SUBCUTANEOUS
  Filled 2017-02-08 (×8): qty 1

## 2017-02-08 MED ORDER — PREDNISONE 20 MG PO TABS
40.0000 mg | ORAL_TABLET | Freq: Every day | ORAL | Status: DC
  Administered 2017-02-09: 40 mg via ORAL
  Filled 2017-02-08: qty 2

## 2017-02-08 MED ORDER — INSULIN GLARGINE 100 UNIT/ML ~~LOC~~ SOLN
18.0000 [IU] | Freq: Every day | SUBCUTANEOUS | Status: DC
  Administered 2017-02-08 – 2017-02-11 (×4): 18 [IU] via SUBCUTANEOUS
  Filled 2017-02-08 (×4): qty 0.18

## 2017-02-08 NOTE — Progress Notes (Signed)
[facility] - [place] at [facility]   PATIENT NAME: Brenda CorpusYvette Espin    MR#:  [id number]  DATE OF BIRTH:  12-06-1955  SUBJECTIVE:  CHIEF COMPLAINT:   Chief Complaint  Patient presents with  . Respiratory Distress   Better cough, shortness of breath and wheezing.  On oxygen via nasal cannula or spelled liters. REVIEW OF SYSTEMS:  Review of Systems  Constitutional: Positive for malaise/fatigue. Negative for chills and fever.  HENT: Negative for sore throat.   Eyes: Negative for blurred vision and double vision.  Respiratory: Positive for cough, shortness of breath and wheezing. Negative for hemoptysis and stridor.   Cardiovascular: Negative for chest pain, palpitations, orthopnea and leg swelling.  Gastrointestinal: Negative for abdominal pain, blood in stool, diarrhea, melena, nausea and vomiting.  Genitourinary: Negative for dysuria, flank pain and hematuria.  Musculoskeletal: Negative for back pain and joint pain.  Skin: Negative for itching.  Neurological: Positive for weakness. Negative for dizziness, sensory change, focal weakness, seizures, loss of consciousness and headaches.  Endo/Heme/Allergies: Negative for polydipsia.  Psychiatric/Behavioral: Negative for depression. The patient is not nervous/anxious.     DRUG ALLERGIES:   Allergies  Allergen Reactions  . Metformin Other (See Comments)    Other reaction(s): Other (See Comments) Constipation, dry mouth, dizziness  . Bismuth Subsalicylate Rash    Pepto Bismol   VITALS:  Blood pressure 113/60, pulse 90, temperature 98.1 F (36.7 C), temperature source Oral, resp. rate 18, height 5\' 4"  (1.626 m), weight 229 lb (103.9 kg), SpO2 100 %. PHYSICAL EXAMINATION:  Physical Exam  Constitutional: She is oriented to person, place, and time and well-developed, well-nourished, and in no distress.  HENT:  Head: Normocephalic.  Mouth/Throat: Oropharynx is clear and moist.  Eyes: Conjunctivae and EOM are  normal. Pupils are equal, round, and reactive to light. No scleral icterus.  Neck: Normal range of motion. Neck supple. No JVD present. No tracheal deviation present.  Cardiovascular: Normal rate, regular rhythm and normal heart sounds. Exam reveals no gallop.  No murmur heard. Pulmonary/Chest: Effort normal. No respiratory distress. She has wheezes. She has no rales.  Abdominal: Soft. Bowel sounds are normal. She exhibits no distension. There is no tenderness. There is no rebound.  Musculoskeletal: Normal range of motion. She exhibits no edema or tenderness.  Neurological: She is alert and oriented to person, place, and time. No cranial nerve deficit.  Skin: No rash noted. No erythema.  Psychiatric: Affect normal.   LABORATORY PANEL:  Female CBC Recent Labs  Lab 02/08/17 0420  WBC 6.3  HGB 16.0  HCT 51.3*  PLT 116*   ------------------------------------------------------------------------------------------------------------------ Chemistries  Recent Labs  Lab 02/07/17 0442 02/08/17 0420  NA 139 140  K 4.3 4.3  CL 102 101  CO2 29 31  GLUCOSE 190* 276*  BUN 17 21*  CREATININE 1.24* 1.22*  CALCIUM 9.2 8.9  MG 2.0  --   AST 37  --   ALT 42  --   ALKPHOS 109  --   BILITOT 0.9  --    RADIOLOGY:  No results found. ASSESSMENT AND PLAN:   Acute on chronic respiratory failure with hypoxia due to COPD exacerbation. The patient will be admitted to medical floor. Try to wean down to baseline 2 L. Discontinue IV Solu-Medrol, start prednisone p.o., continue DuoNeb every 6 hours, continue Spiriva and Dulera. Robitussin as needed.  Diabetes.    Increased Lantus to 18 units daily and on sliding scale.  Hypertension.  Continue home hypertension  medication.  Morbid obesity and OSA.  CPAP at night. Seizure disorder.  Continue home seizure medication. History of CVA.  Continue aspirin and statin.  All the records are reviewed and case discussed with Care Management/Social  Worker. Management plans discussed with the patient, family and they are in agreement.  CODE STATUS: Full Code  TOTAL TIME TAKING CARE OF THIS PATIENT:33 minutes.   More than 50% of the time was spent in counseling/coordination of care: YES  POSSIBLE D/C IN 1-2 DAYS, DEPENDING ON CLINICAL CONDITION.   Shaune Pollack M.D on 02/08/2017 at 3:38 PM  Between 7am to 6pm - Pager - [phone]  After 6pm go to [email or web address] - Therapist, nutritional Hospitalists

## 2017-02-08 NOTE — Progress Notes (Signed)
Inpatient Diabetes Program Recommendations  AACE/ADA: New Consensus Statement on Inpatient Glycemic Control (2015)  Target Ranges:  Prepandial:   less than 140 mg/dL      Peak postprandial:   less than 180 mg/dL (1-2 hours)      Critically ill patients:  140 - 180 mg/dL   Lab Results  Component Value Date   GLUCAP 318 (H) 02/08/2017   Review of Glycemic Control  Diabetes history: DM 2 Outpatient Diabetes medications: Lantus 15 units, Glipizide 5 mg BID, Januvia 50 mg Daily Current orders for Inpatient glycemic control: Lantus 18 units, Novolog Sensitive Correction 0-9 units tid + Novolog HS scale 0-5 units  Inpatient Diabetes Program Recommendations:    Patient receiving IV Solumedrol 60 mg Q6 hours. Noted Lantus increased to 18 units. Consider increasing Novolog Correction to Moderate 0-15 units tid.  Consider Novolog 3 units tid meal coverage in addition to correction scale if patient consumes at least 50% of meals.  Thanks,  Christena Deem RN, MSN, [facility] Inpatient Diabetes Coordinator Team Pager [phone] (8a-5p)

## 2017-02-08 NOTE — Care Management (Signed)
Patient admitted from home with respiratory failure.  Patient lives at home with spouse.  PCP VELAZQUEZ.  Patient has chronic home O2 through [facility].  RNCM following

## 2017-02-08 NOTE — Plan of Care (Signed)
Patient is progressing but still wheezing and very lethargic.  Brenda Wu

## 2017-02-09 LAB — GLUCOSE, CAPILLARY
GLUCOSE-CAPILLARY: 214 mg/dL — AB (ref 65–99)
GLUCOSE-CAPILLARY: 245 mg/dL — AB (ref 65–99)
GLUCOSE-CAPILLARY: 328 mg/dL — AB (ref 65–99)
Glucose-Capillary: 86 mg/dL (ref 65–99)

## 2017-02-09 MED ORDER — IPRATROPIUM-ALBUTEROL 0.5-2.5 (3) MG/3ML IN SOLN
3.0000 mL | RESPIRATORY_TRACT | Status: DC
  Administered 2017-02-09 – 2017-02-11 (×11): 3 mL via RESPIRATORY_TRACT
  Filled 2017-02-09 (×3): qty 3
  Filled 2017-02-09: qty 30
  Filled 2017-02-09 (×7): qty 3

## 2017-02-09 MED ORDER — PREDNISONE 20 MG PO TABS: 20.0000 mg | ORAL_TABLET | Freq: Every day | ORAL | Status: DC

## 2017-02-09 MED ORDER — PREDNISONE 20 MG PO TABS: 10.0000 mg | ORAL_TABLET | Freq: Every day | ORAL | Status: DC

## 2017-02-09 MED ORDER — PREDNISONE 20 MG PO TABS
30.0000 mg | ORAL_TABLET | Freq: Every day | ORAL | Status: AC
  Administered 2017-02-10: 30 mg via ORAL
  Filled 2017-02-09: qty 2

## 2017-02-09 MED ORDER — PREDNISONE 5 MG PO TABS: 5.0000 mg | ORAL_TABLET | Freq: Every day | ORAL | Status: DC

## 2017-02-09 NOTE — Progress Notes (Signed)
[facility] - [place] at [facility]   PATIENT NAME: Brenda Wu    MR#:  [id number]  DATE OF BIRTH:  1955/10/13  SUBJECTIVE:  CHIEF COMPLAINT:   Chief Complaint  Patient presents with  . Respiratory Distress   Better cough, shortness of breath and wheezing.  On oxygen via nasal cannula 3 liters. REVIEW OF SYSTEMS:  Review of Systems  Constitutional: Positive for malaise/fatigue. Negative for chills and fever.  HENT: Negative for sore throat.   Eyes: Negative for blurred vision and double vision.  Respiratory: Positive for cough, shortness of breath and wheezing. Negative for hemoptysis and stridor.   Cardiovascular: Negative for chest pain, palpitations, orthopnea and leg swelling.  Gastrointestinal: Negative for abdominal pain, blood in stool, diarrhea, melena, nausea and vomiting.  Genitourinary: Negative for dysuria, flank pain and hematuria.  Musculoskeletal: Negative for back pain and joint pain.  Skin: Negative for itching.  Neurological: Positive for weakness. Negative for dizziness, sensory change, focal weakness, seizures, loss of consciousness and headaches.  Endo/Heme/Allergies: Negative for polydipsia.  Psychiatric/Behavioral: Negative for depression. The patient is not nervous/anxious.     DRUG ALLERGIES:   Allergies  Allergen Reactions  . Metformin Other (See Comments)    Other reaction(s): Other (See Comments) Constipation, dry mouth, dizziness  . Bismuth Subsalicylate Rash    Pepto Bismol   VITALS:  Blood pressure (!) 145/82, pulse 84, temperature (!) 97.4 F (36.3 C), temperature source Oral, resp. rate (!) 22, height 5\' 4"  (1.626 m), weight 229 lb (103.9 kg), SpO2 96 %. PHYSICAL EXAMINATION:  Physical Exam  Constitutional: She is oriented to person, place, and time and well-developed, well-nourished, and in no distress.  HENT:  Head: Normocephalic.  Mouth/Throat: Oropharynx is clear and moist.  Eyes: Conjunctivae and EOM are  normal. Pupils are equal, round, and reactive to light. No scleral icterus.  Neck: Normal range of motion. Neck supple. No JVD present. No tracheal deviation present.  Cardiovascular: Normal rate, regular rhythm and normal heart sounds. Exam reveals no gallop.  No murmur heard. Pulmonary/Chest: Effort normal. No respiratory distress. She has wheezes. She has no rales.  Abdominal: Soft. Bowel sounds are normal. She exhibits no distension. There is no tenderness. There is no rebound.  Musculoskeletal: Normal range of motion. She exhibits no edema or tenderness.  Neurological: She is alert and oriented to person, place, and time. No cranial nerve deficit.  Skin: No rash noted. No erythema.  Psychiatric: Affect normal.   LABORATORY PANEL:  Female CBC Recent Labs  Lab 02/08/17 0420  WBC 6.3  HGB 16.0  HCT 51.3*  PLT 116*   ------------------------------------------------------------------------------------------------------------------ Chemistries  Recent Labs  Lab 02/07/17 0442 02/08/17 0420  NA 139 140  K 4.3 4.3  CL 102 101  CO2 29 31  GLUCOSE 190* 276*  BUN 17 21*  CREATININE 1.24* 1.22*  CALCIUM 9.2 8.9  MG 2.0  --   AST 37  --   ALT 42  --   ALKPHOS 109  --   BILITOT 0.9  --    RADIOLOGY:  No results found. ASSESSMENT AND PLAN:   Acute on chronic respiratory failure with hypoxia due to COPD exacerbation. The patient will be admitted to medical floor. Try to wean down to baseline 2 L. Discontinued IV Solu-Medrol, started prednisone p.o., continue DuoNeb every 6 hours, continue Spiriva and Dulera. Robitussin as needed.  Diabetes.    Increased Lantus to 18 units daily and on sliding scale.  Hypertension.  Continue  home hypertension medication.  Morbid obesity and OSA.  CPAP at night. Seizure disorder.  Continue home seizure medication. History of CVA.  Continue aspirin and statin.  All the records are reviewed and case discussed with Care Management/Social  Worker. Management plans discussed with the patient, family and they are in agreement.  CODE STATUS: Full Code  TOTAL TIME TAKING CARE OF THIS PATIENT:28 minutes.   More than 50% of the time was spent in counseling/coordination of care: YES  POSSIBLE D/C IN 1-2 DAYS, DEPENDING ON CLINICAL CONDITION.   Shaune Pollack M.D on 02/09/2017 at 1:17 PM  Between 7am to 6pm - Pager - [phone]  After 6pm go to [email or web address] - Therapist, nutritional Hospitalists

## 2017-02-10 LAB — BASIC METABOLIC PANEL
ANION GAP: 10 (ref 5–15)
BUN: 25 mg/dL — ABNORMAL HIGH (ref 6–20)
CALCIUM: 9.3 mg/dL (ref 8.9–10.3)
CO2: 35 mmol/L — AB (ref 22–32)
CREATININE: 1.21 mg/dL — AB (ref 0.44–1.00)
Chloride: 94 mmol/L — ABNORMAL LOW (ref 101–111)
GFR calc Af Amer: 55 mL/min — ABNORMAL LOW (ref >60)
GFR calc non Af Amer: 48 mL/min — ABNORMAL LOW (ref >60)
GLUCOSE: 345 mg/dL — AB (ref 65–99)
Potassium: 4.3 mmol/L (ref 3.5–5.1)
Sodium: 139 mmol/L (ref 135–145)

## 2017-02-10 LAB — GLUCOSE, CAPILLARY
GLUCOSE-CAPILLARY: 107 mg/dL — AB (ref 65–99)
GLUCOSE-CAPILLARY: 332 mg/dL — AB (ref 65–99)
GLUCOSE-CAPILLARY: 169 mg/dL — AB (ref 65–99)
Glucose-Capillary: 338 mg/dL — ABNORMAL HIGH (ref 65–99)

## 2017-02-10 MED ORDER — POLYETHYLENE GLYCOL 3350 17 G PO PACK
17.0000 g | PACK | Freq: Every day | ORAL | Status: AC
  Administered 2017-02-10 – 2017-02-11 (×2): 17 g via ORAL
  Filled 2017-02-10 (×2): qty 1

## 2017-02-10 MED ORDER — METHYLPREDNISOLONE SODIUM SUCC 125 MG IJ SOLR
60.0000 mg | INTRAMUSCULAR | Status: DC
  Administered 2017-02-10 – 2017-02-11 (×2): 60 mg via INTRAVENOUS
  Filled 2017-02-10 (×2): qty 2

## 2017-02-10 MED ORDER — FUROSEMIDE 10 MG/ML IJ SOLN
40.0000 mg | Freq: Once | INTRAMUSCULAR | Status: AC
  Administered 2017-02-10: 40 mg via INTRAVENOUS
  Filled 2017-02-10: qty 4

## 2017-02-10 MED ORDER — SENNOSIDES-DOCUSATE SODIUM 8.6-50 MG PO TABS
1.0000 | ORAL_TABLET | Freq: Two times a day (BID) | ORAL | Status: DC
  Administered 2017-02-10 – 2017-02-12 (×5): 1 via ORAL
  Filled 2017-02-10 (×5): qty 1

## 2017-02-10 NOTE — Progress Notes (Signed)
Chaplain rounding unit visited with pt. [place] I met pt but pt was asleep. CH was not able to talk with the pt this time but will make a follow up with her as needed to in order to provide spiritual care.    02/10/17 1400  Clinical Encounter Type  Visited With Patient  Visit Type Initial;Other (Comment)  Referral From Chaplain  Consult/Referral To Chaplain  Spiritual Encounters  Spiritual Needs Other (Comment)

## 2017-02-10 NOTE — Progress Notes (Signed)
[facility] - [place] at [facility]   PATIENT NAME: Brenda Wu    MR#:  [id number]  DATE OF BIRTH:  09/29/1955  SUBJECTIVE:  CHIEF COMPLAINT:   Chief Complaint  Patient presents with  . Respiratory Distress   -Still complains of dyspnea and weakness. -At home she only uses when necessary oxygen. Here she is on 3 L still  REVIEW OF SYSTEMS:  Review of Systems  Constitutional: Negative for chills, fever and malaise/fatigue.  HENT: Negative for congestion, ear discharge, hearing loss and nosebleeds.   Eyes: Negative for blurred vision and double vision.  Respiratory: Positive for cough and shortness of breath. Negative for wheezing.   Cardiovascular: Positive for leg swelling. Negative for chest pain and palpitations.  Gastrointestinal: Negative for abdominal pain, constipation, diarrhea, nausea and vomiting.  Genitourinary: Negative for dysuria.  Musculoskeletal: Positive for myalgias.  Neurological: Negative for dizziness, speech change, focal weakness, seizures and headaches.  Psychiatric/Behavioral: Negative for depression.    DRUG ALLERGIES:   Allergies  Allergen Reactions  . Metformin Other (See Comments)    Other reaction(s): Other (See Comments) Constipation, dry mouth, dizziness  . Bismuth Subsalicylate Rash    Pepto Bismol    VITALS:  Blood pressure 115/76, pulse 88, temperature 98 F (36.7 C), temperature source Oral, resp. rate 16, height 5\' 4"  (1.626 m), weight 103.9 kg (229 lb), SpO2 94 %.  PHYSICAL EXAMINATION:  Physical Exam  GENERAL:  61 y.o.-year-old obese patient lying in the bed with no acute distress. Tachypneic with exertion EYES: Pupils equal, round, reactive to light and accommodation. No scleral icterus. Extraocular muscles intact.  HEENT: Head atraumatic, normocephalic. Oropharynx and nasopharynx clear.  NECK:  Supple, no jugular venous distention. No thyroid enlargement, no tenderness.  LUNGS: Moving air  bilaterally, decreased at the bases. Scattered wheezing with occasional rhonchi. Not using accessory muscles to breathe.  CARDIOVASCULAR: S1, S2 normal. No murmurs, rubs, or gallops.  ABDOMEN: Soft, nontender, nondistended. Bowel sounds present. No organomegaly or mass.  EXTREMITIES: No cyanosis, or clubbing. 1+ pedal edema noted NEUROLOGIC: Cranial nerves II through XII are intact. Muscle strength 5/5 in all extremities. Sensation intact. Gait not checked.  PSYCHIATRIC: The patient is alert and oriented x 3.  SKIN: No obvious rash, lesion, or ulcer.    LABORATORY PANEL:   CBC Recent Labs  Lab 02/08/17 0420  WBC 6.3  HGB 16.0  HCT 51.3*  PLT 116*   ------------------------------------------------------------------------------------------------------------------  Chemistries  Recent Labs  Lab 02/07/17 0442  02/10/17 1304  NA 139   < > 139  K 4.3   < > 4.3  CL 102   < > 94*  CO2 29   < > 35*  GLUCOSE 190*   < > 345*  BUN 17   < > 25*  CREATININE 1.24*   < > 1.21*  CALCIUM 9.2   < > 9.3  MG 2.0  --   --   AST 37  --   --   ALT 42  --   --   ALKPHOS 109  --   --   BILITOT 0.9  --   --    < > = values in this interval not displayed.   ------------------------------------------------------------------------------------------------------------------  Cardiac Enzymes Recent Labs  Lab 02/07/17 0442  TROPONINI <0.03   ------------------------------------------------------------------------------------------------------------------  RADIOLOGY:  No results found.  EKG:   Orders placed or performed during the hospital encounter of 02/07/17  . ED EKG  . ED EKG  .  EKG 12-Lead  . EKG 12-Lead    ASSESSMENT AND PLAN:   61 year old female with known history of COPD on when necessary oxygen only at home, diabetes mellitus, hypertension, seizure disorder, sleep apnea and history of stroke with no neurological deficits presents to the hospital secondary to hypoxic  respiratory failure.  1. Acute on chronic respiratory failure-secondary to COPD exacerbation. -Chest x-ray with no infiltrate noted. Due to wheezing -restarted on IV Solu-Medrol -Give 1 dose of Lasix. Still on 3 L oxygen-wean to room air. Patient only uses when necessary oxygen at home. -Continue nebulizers, inhalers.  2. Uncontrolled diabetes mellitus-worsened due to being on steroids. -Continue Lantus and sliding scale  3. Sleep apnea and obesity-continue CPAP at bedtime  4. Seizure disorder-stable. Continue home medications.  5. DVT prophylaxis-on Lovenox  Encourage ambulation    All the records are reviewed and case discussed with Care Management/Social Workerr. Management plans discussed with the patient, family and they are in agreement.  CODE STATUS: Full Code  TOTAL TIME TAKING CARE OF THIS PATIENT: 38 minutes.   POSSIBLE D/C IN 2 DAYS, DEPENDING ON CLINICAL CONDITION.   Hutch Rhett M.D on 02/10/2017 at 1:39 PM  Between 7am to 6pm - Pager - [phone]  After 6pm go to [email or web address] - password [email or web address]  [facility]  Office  [phone]  CC: Primary care physician; Cheron Schaumann., MD

## 2017-02-11 ENCOUNTER — Encounter: Payer: Self-pay | Admitting: Radiology

## 2017-02-11 ENCOUNTER — Inpatient Hospital Stay: Payer: Medicare HMO

## 2017-02-11 LAB — GLUCOSE, CAPILLARY
Glucose-Capillary: 120 mg/dL — ABNORMAL HIGH (ref 65–99)
Glucose-Capillary: 267 mg/dL — ABNORMAL HIGH (ref 65–99)
Glucose-Capillary: 230 mg/dL — ABNORMAL HIGH (ref 65–99)
Glucose-Capillary: 275 mg/dL — ABNORMAL HIGH (ref 65–99)

## 2017-02-11 LAB — CBC
HEMATOCRIT: 52.9 % — AB (ref 35.0–47.0)
HEMOGLOBIN: 16.3 g/dL — AB (ref 12.0–16.0)
MCH: 30 pg (ref 26.0–34.0)
MCHC: 30.9 g/dL — ABNORMAL LOW (ref 32.0–36.0)
MCV: 97 fL (ref 80.0–100.0)
Platelets: 137 10*3/uL — ABNORMAL LOW (ref 150–440)
RBC: 5.45 MIL/uL — ABNORMAL HIGH (ref 3.80–5.20)
RDW: 15.8 % — AB (ref 11.5–14.5)
WBC: 7.7 10*3/uL (ref 3.6–11.0)

## 2017-02-11 MED ORDER — INSULIN ASPART 100 UNIT/ML ~~LOC~~ SOLN
3.0000 [IU] | Freq: Three times a day (TID) | SUBCUTANEOUS | Status: DC
  Administered 2017-02-11 – 2017-02-12 (×2): 3 [IU] via SUBCUTANEOUS
  Filled 2017-02-11 (×2): qty 1

## 2017-02-11 MED ORDER — IPRATROPIUM-ALBUTEROL 0.5-2.5 (3) MG/3ML IN SOLN
3.0000 mL | Freq: Four times a day (QID) | RESPIRATORY_TRACT | Status: DC
  Administered 2017-02-11 – 2017-02-12 (×3): 3 mL via RESPIRATORY_TRACT
  Filled 2017-02-11 (×3): qty 3

## 2017-02-11 MED ORDER — NIACIN ER (ANTIHYPERLIPIDEMIC) 500 MG PO TBCR
500.0000 mg | EXTENDED_RELEASE_TABLET | Freq: Every day | ORAL | Status: DC
  Administered 2017-02-11 – 2017-02-12 (×2): 500 mg via ORAL
  Filled 2017-02-11 (×2): qty 1

## 2017-02-11 MED ORDER — IOPAMIDOL (ISOVUE-370) INJECTION 76%
75.0000 mL | Freq: Once | INTRAVENOUS | Status: AC | PRN
  Administered 2017-02-11: 75 mL via INTRAVENOUS

## 2017-02-11 MED ORDER — INSULIN GLARGINE 100 UNIT/ML ~~LOC~~ SOLN
21.0000 [IU] | Freq: Every day | SUBCUTANEOUS | Status: DC
  Administered 2017-02-12: 21 [IU] via SUBCUTANEOUS
  Filled 2017-02-11: qty 0.21

## 2017-02-11 MED ORDER — FUROSEMIDE 10 MG/ML IJ SOLN
40.0000 mg | Freq: Every day | INTRAMUSCULAR | Status: DC
  Administered 2017-02-11 – 2017-02-12 (×2): 40 mg via INTRAVENOUS
  Filled 2017-02-11 (×2): qty 4

## 2017-02-11 NOTE — Progress Notes (Signed)
RN walked with patient around nursing station with 2L of oxygen. Pt.'s O2 stats dropped to 84% on 2L and stated "I feel short of breath", RN increased O2 to 3L and O2 went to 91%. When pt is resting in bed/chair O2 is 96% on 2L. Will continue to monitor pt and encourage ambulation.   Oakley Kossman [employer]

## 2017-02-11 NOTE — Care Management Important Message (Signed)
Important Message  Patient Details  Name: Brenda Wu MRN: [id number] Date of Birth: 1955/10/22   Medicare Important Message Given:  Yes    Chapman Fitch, RN 02/11/2017, 3:37 PM

## 2017-02-11 NOTE — Progress Notes (Signed)
Inpatient Diabetes Program Recommendations  AACE/ADA: New Consensus Statement on Inpatient Glycemic Control (2015)  Target Ranges:  Prepandial:   less than 140 mg/dL      Peak postprandial:   less than 180 mg/dL (1-2 hours)      Critically ill patients:  140 - 180 mg/dL   Lab Results  Component Value Date   GLUCAP 267 (H) 02/11/2017    Review of Glycemic Control  Results for Brenda Wu, Brenda Wu (MRN [id number]) as of 02/11/2017 12:09  Ref. Range 02/10/2017 11:24 02/10/2017 17:00 02/10/2017 22:06 02/11/2017 07:31 02/11/2017 11:41  Glucose-Capillary Latest Ref Range: 65 - 99 mg/dL 300 (H) 923 (H) 300 (H) 120 (H) 267 (H)   Diabetes history: DM 2 Outpatient Diabetes medications: Lantus 15 units, Glipizide 5 mg BID, Januvia 50 mg Daily  Current orders for Inpatient glycemic control: Lantus 18 units, Novolog Correction 0-15 units tid + Novolog HS scale 0-5 units * prednisone 60mg  q24h  Inpatient Diabetes Program Recommendations:     While the patient is on steroids, consider Novolog 3 units tid meal coverage in addition to correction scale if patient consumes at least 50% of meals.  Text paged MD regarding recommendation.   Susette Racer, RN, BA, MHA, CDE Diabetes Coordinator Inpatient Diabetes Program  [phone] (Team Pager) [phone] [facility] Office) 02/11/2017 12:10 PM

## 2017-02-11 NOTE — Progress Notes (Signed)
[facility] [place] [facility]   PATIENT NAME: Brenda Wu    MR#:  [id number]  DATE OF BIRTH:  September 26, 1955  SUBJECTIVE:  CHIEF COMPLAINT:   Chief Complaint  Patient presents with  . Respiratory Distress   -Still complains of dyspnea and weakness. -At home she only uses when necessary oxygen. Here she is on 3 L still  REVIEW OF SYSTEMS:  Review of Systems  Constitutional: Negative for chills, fever and malaise/fatigue.  HENT: Negative for congestion, ear discharge, hearing loss and nosebleeds.   Eyes: Negative for blurred vision and double vision.  Respiratory: Positive for cough and shortness of breath. Negative for wheezing.   Cardiovascular: Positive for leg swelling. Negative for chest pain and palpitations.  Gastrointestinal: Negative for abdominal pain, constipation, diarrhea, nausea and vomiting.  Genitourinary: Negative for dysuria.  Musculoskeletal: Positive for myalgias.  Neurological: Negative for dizziness, speech change, focal weakness, seizures and headaches.  Psychiatric/Behavioral: Negative for depression.    DRUG ALLERGIES:   Allergies  Allergen Reactions  . Metformin Other (See Comments)    Other reaction(s): Other (See Comments) Constipation, dry mouth, dizziness  . Bismuth Subsalicylate Rash    Pepto Bismol    VITALS:  Blood pressure 121/79, pulse 82, temperature 98.3 F (36.8 C), temperature source Oral, resp. rate 20, height 5\' 4"  (1.626 m), weight 103.9 kg (229 lb), SpO2 96 %.  PHYSICAL EXAMINATION:  Physical Exam  GENERAL:  61 y.o.-year-old obese patient lying in the bed with no acute distress. Tachypneic with exertion EYES: Pupils equal, round, reactive to light and accommodation. No scleral icterus. Extraocular muscles intact.  HEENT: Head atraumatic, normocephalic. Oropharynx and nasopharynx clear.  NECK:  Supple, no jugular venous distention. No thyroid enlargement, no tenderness.  LUNGS: Moving air  bilaterally, decreased at the bases. Scattered wheezing with occasional rhonchi. Not using accessory muscles to breathe.  CARDIOVASCULAR: S1, S2 normal. No murmurs, rubs, or gallops.  ABDOMEN: Soft, nontender, nondistended. Bowel sounds present. No organomegaly or mass.  EXTREMITIES: No cyanosis, or clubbing. 1+ pedal edema noted NEUROLOGIC: Cranial nerves II through XII are intact. Muscle strength 5/5 in all extremities. Sensation intact. Gait not checked.  PSYCHIATRIC: The patient is alert and oriented x 3.  SKIN: No obvious rash, lesion, or ulcer.    LABORATORY PANEL:   CBC Recent Labs  Lab 02/11/17 0425  WBC 7.7  HGB 16.3*  HCT 52.9*  PLT 137*   ------------------------------------------------------------------------------------------------------------------  Chemistries  Recent Labs  Lab 02/07/17 0442  02/10/17 1304  NA 139   < > 139  K 4.3   < > 4.3  CL 102   < > 94*  CO2 29   < > 35*  GLUCOSE 190*   < > 345*  BUN 17   < > 25*  CREATININE 1.24*   < > 1.21*  CALCIUM 9.2   < > 9.3  MG 2.0  --   --   AST 37  --   --   ALT 42  --   --   ALKPHOS 109  --   --   BILITOT 0.9  --   --    < > = values in this interval not displayed.   ------------------------------------------------------------------------------------------------------------------  Cardiac Enzymes Recent Labs  Lab 02/07/17 0442  TROPONINI <0.03   ------------------------------------------------------------------------------------------------------------------  RADIOLOGY:  Ct Angio Chest Pe W Or Wo Contrast  Result Date: 02/11/2017 CLINICAL DATA:  Persistent cough and difficulty breathing. EXAM: CT ANGIOGRAPHY CHEST WITH CONTRAST TECHNIQUE:  Multidetector CT imaging of the chest was performed using the standard protocol during bolus administration of intravenous contrast. Multiplanar CT image reconstructions and MIPs were obtained to evaluate the vascular anatomy. CONTRAST:  75mL ISOVUE-370 IOPAMIDOL  (ISOVUE-370) INJECTION 76% COMPARISON:  Chest radiograph, 02/07/2017.  Chest CT, 08/01/2015. FINDINGS: Cardiovascular: Satisfactory opacification of the pulmonary arteries to the segmental level. No evidence of pulmonary embolism. Heart is mildly enlarged, increased in size compared to the prior CT. No pericardial effusion. Minimal coronary artery calcifications. Great vessels are normal caliber. No aortic dissection or atherosclerosis. Mediastinum/Nodes: No enlarged mediastinal, hilar, or axillary lymph nodes. Thyroid gland, trachea, and esophagus demonstrate no significant findings. Lungs/Pleura: Bilateral, dependent, lower lobe patchy and linear type airspace opacities consistent with atelectasis. There is associated bronchial wall thickening. Remainder of the lungs is clear. No lung mass or nodule. Moderate centrilobular emphysema. No pleural effusion. No pneumothorax. Upper Abdomen: No acute abnormality. Musculoskeletal: Old healed sternal fracture. Old moderate compression fracture of T4, with mild depression of the upper endplate of T3, also chronic. No acute fractures. No osteoblastic or osteolytic lesions. Review of the MIP images confirms the above findings. IMPRESSION: 1. No evidence of a pulmonary embolism. 2. Cardiomegaly developing since the prior CT. 3. Moderate emphysema. 4. Dependent lower lobe atelectasis with associated bronchial wall thickening, likely due to chronic bronchial inflammation. 5. Chronic sternal, T3 and T4 fractures. Emphysema (ICD10-J43.9). Electronically Signed   By: Amie Portlandavid  Ormond M.D.   On: 02/11/2017 08:35    EKG:   Orders placed or performed during the hospital encounter of 02/07/17  . ED EKG  . ED EKG  . EKG 12-Lead  . EKG 12-Lead    ASSESSMENT AND PLAN:   61 year old female with known history of COPD on when necessary oxygen only at home, diabetes mellitus, hypertension, seizure disorder, sleep apnea and history of stroke with no neurological deficits presents  to the hospital secondary to hypoxic respiratory failure.  1. Acute on chronic respiratory failure-secondary to COPD exacerbation. -Chest x-ray with no infiltrate noted.  on IV Solu-Medrol -Still on 3 L oxygen-wean to room air. Patient only uses 2l when necessary oxygen at home. -One dose of Lasix given due to cardiomegaly and pulmonary vascular congestion. CT of the chest negative for PE. -Echo ordered to rule out diastolic dysfunction. -Continue IV Lasix today as well -Continue nebulizers, inhalers. -Plan to wean oxygen. Might need chronic home O2 at discharge. Incentive spirometer encouraged  2. Uncontrolled diabetes mellitus-worsened due to being on steroids. -Continue Lantus and sliding scale -Appreciate diabetes coordinator's input. Lantus dose increased and NovoLog pre-meal insulin added  3. Sleep apnea and obesity-continue CPAP at bedtime  4. Seizure disorder-stable. Continue home medications. On Lamictal and Topamax at home  5. DVT prophylaxis-on Lovenox  Encourage ambulation. Incentive spirometry    All the records are reviewed and case discussed with Care Management/Social Workerr. Management plans discussed with the patient, family and they are in agreement.  CODE STATUS: Full Code  TOTAL TIME TAKING CARE OF THIS PATIENT: 38 minutes.   POSSIBLE D/C IN 1-2 DAYS, DEPENDING ON CLINICAL CONDITION.   Enid BaasKALISETTI,Krysteena Stalker M.D on 02/11/2017 at 12:25 PM  Between 7am to 6pm - Pager - [phone]  After 6pm go to [email or web address] - password [email or web address]  [facility]  Office  [phone]  CC: Primary care physician; Cheron SchaumannVelazquez, Gretchen Y., MD

## 2017-02-12 ENCOUNTER — Inpatient Hospital Stay (HOSPITAL_COMMUNITY)
Admit: 2017-02-12 | Discharge: 2017-02-12 | Disposition: A | Discharge status: Home / Self Care | Payer: Medicare HMO | Attending: Internal Medicine | Admitting: Internal Medicine

## 2017-02-12 DIAGNOSIS — I340 Nonrheumatic mitral (valve) insufficiency: Secondary | ICD-10-CM

## 2017-02-12 LAB — COMPREHENSIVE METABOLIC PANEL
ALK PHOS: 78 U/L (ref 38–126)
ALT: 30 U/L (ref 14–54)
AST: 17 U/L (ref 15–41)
Albumin: 3.8 g/dL (ref 3.5–5.0)
Anion gap: 5 (ref 5–15)
BUN: 29 mg/dL — AB (ref 6–20)
CALCIUM: 9.4 mg/dL (ref 8.9–10.3)
CHLORIDE: 96 mmol/L — AB (ref 101–111)
CO2: 38 mmol/L — AB (ref 22–32)
CREATININE: 0.96 mg/dL (ref 0.44–1.00)
GFR calc Af Amer: >60 mL/min (ref >60)
GFR calc non Af Amer: >60 mL/min (ref >60)
GLUCOSE: 80 mg/dL (ref 65–99)
Potassium: 3.3 mmol/L — ABNORMAL LOW (ref 3.5–5.1)
SODIUM: 139 mmol/L (ref 135–145)
Total Bilirubin: 0.6 mg/dL (ref 0.3–1.2)
Total Protein: 6.8 g/dL (ref 6.5–8.1)

## 2017-02-12 LAB — ECHOCARDIOGRAM COMPLETE
E decel time: 151 msec
EERAT: 12.42
FS: 40 % (ref 28–44)
Height: 64 in
IV/PV OW: 0.82
LA ID, A-P, ES: 47 mm
LA diam index: 2.12 cm/m2
LA vol index: 34 mL/m2
LA vol: 75.5 mL
LAVOLA4C: 54.6 mL
LEFT ATRIUM END SYS DIAM: 47 mm
LV E/e'average: 12.42
LV PW d: 11.3 mm — AB (ref 0.6–1.1)
LV TDI E'MEDIAL: 5.55
LVEEMED: 12.42
LVELAT: 8.05 cm/s
MV Dec: 151
MVAP: 5 cm2
MVPG: 4 mmHg
MVPKAVEL: 118 m/s
MVPKEVEL: 100 m/s
MVSPHT: 44 ms
RV LATERAL S' VELOCITY: 18.3 cm/s
RV TAPSE: 28.6 mm
TDI e' lateral: 8.05
VTI: 194 cm
Weight: 3664 oz

## 2017-02-12 LAB — GLUCOSE, CAPILLARY
Glucose-Capillary: 94 mg/dL (ref 65–99)
Glucose-Capillary: 262 mg/dL — ABNORMAL HIGH (ref 65–99)

## 2017-02-12 MED ORDER — SENNOSIDES-DOCUSATE SODIUM 8.6-50 MG PO TABS: 1.0000 | ORAL_TABLET | Freq: Two times a day (BID) | ORAL | 1 refills | Status: DC

## 2017-02-12 MED ORDER — POTASSIUM CHLORIDE CRYS ER 20 MEQ PO TBCR
40.0000 meq | EXTENDED_RELEASE_TABLET | ORAL | Status: DC
  Administered 2017-02-12: 40 meq via ORAL
  Filled 2017-02-12: qty 2

## 2017-02-12 MED ORDER — POTASSIUM CHLORIDE CRYS ER 20 MEQ PO TBCR: 20.0000 meq | EXTENDED_RELEASE_TABLET | Freq: Every day | ORAL | 2 refills | Status: AC

## 2017-02-12 MED ORDER — PREDNISONE 10 MG PO TABS: 10.0000 mg | ORAL_TABLET | Freq: Every day | ORAL | 0 refills | Status: DC

## 2017-02-12 MED ORDER — FUROSEMIDE 40 MG PO TABS: 40.0000 mg | ORAL_TABLET | Freq: Every day | ORAL | 11 refills | Status: DC

## 2017-02-12 MED ORDER — DILTIAZEM HCL ER 60 MG PO CP12: 60.0000 mg | ORAL_CAPSULE | Freq: Every day | ORAL | Status: DC

## 2017-02-12 MED ORDER — GUAIFENESIN ER 600 MG PO TB12: 1200.0000 mg | ORAL_TABLET | Freq: Two times a day (BID) | ORAL | 0 refills | Status: DC

## 2017-02-12 NOTE — Care Management Note (Signed)
Case Management Note  Patient Details  Name: Brenda Wu MRN: [id number] Date of Birth: 1955/08/23  Subjective/Objective:   A referral for home health PT, RN, and an increase in home 02 supplied by [facility] from PRN to 2L continuous was called to Shaune Leeks at [facility]                  Action/Plan:   Expected Discharge Date:  02/12/17               Expected Discharge Plan:  Home w Home Health Services  In-House Referral:     Discharge planning Services  CM Consult  Post Acute Care Choice:  Home Health Choice offered to:  Patient  DME Arranged:  Oxygen DME Agency:  [facility]  HH Arranged:  RN, PT [facility] Agency:  [facility]  Status of Service:  Completed, signed off  If discussed at Long Length of Stay Meetings, dates discussed:    Additional Comments:  Edwar Coe A, RN 02/12/2017, 4:15 PM

## 2017-02-12 NOTE — Progress Notes (Signed)
Brenda Wu to be D/C'd Home per MD order.  Discussed prescriptions and follow up appointments with the patient. Prescriptions given to patient, medication list explained in detail. Pt verbalized understanding.  Allergies as of 02/12/2017      Reactions   Metformin Other (See Comments)   Other reaction(s): Other (See Comments) Constipation, dry mouth, dizziness   Bismuth Subsalicylate Rash   Pepto Bismol      Medication List    STOP taking these medications   hydrochlorothiazide 25 MG tablet Commonly known as:  HYDRODIURIL   Potassium 99 MG Tabs     TAKE these medications   albuterol 108 (90 Base) MCG/ACT inhaler Commonly known as:  PROVENTIL HFA;VENTOLIN HFA Inhale 2 puffs into the lungs 4 (four) times daily as needed for wheezing or shortness of breath.   albuterol (2.5 MG/3ML) 0.083% nebulizer solution Commonly known as:  PROVENTIL Inhale 2.5 mg into the lungs every 6 (six) hours as needed for wheezing or shortness of breath.   atorvastatin 40 MG tablet Commonly known as:  LIPITOR Take 40 mg by mouth daily.   diltiazem 60 MG 12 hr capsule Commonly known as:  CARDIZEM SR Take 60 mg by mouth daily.   ferrous sulfate 325 (65 FE) MG tablet Take 325 mg by mouth daily with breakfast.   furosemide 40 MG tablet Commonly known as:  LASIX Take 1 tablet (40 mg total) by mouth daily.   glipiZIDE 5 MG tablet Commonly known as:  GLUCOTROL Take 5 mg by mouth 2 (two) times daily before a meal.   GOODSENSE ASPIRIN 325 MG tablet Generic drug:  aspirin Take 325 mg by mouth daily.   guaiFENesin 600 MG 12 hr tablet Commonly known as:  MUCINEX Take 2 tablets (1,200 mg total) by mouth 2 (two) times daily.   JANUVIA 50 MG tablet Generic drug:  sitaGLIPtin Take 50 mg by mouth daily.   lamoTRIgine 200 MG tablet Commonly known as:  LAMICTAL Take 200 mg by mouth 2 (two) times daily.   LANTUS SOLOSTAR 100 UNIT/ML Solostar Pen Generic drug:  Insulin Glargine Inject 15  Units into the skin daily.   losartan 50 MG tablet Commonly known as:  COZAAR Take 50 mg by mouth daily.   MULTI-VITAMINS Tabs Take 1 tablet by mouth daily.   niacin 500 MG CR capsule Take 500 mg by mouth daily.   potassium chloride SA 20 MEQ tablet Commonly known as:  K-DUR,KLOR-CON Take 1 tablet (20 mEq total) by mouth daily.   predniSONE 10 MG tablet Commonly known as:  DELTASONE Take 1 tablet (10 mg total) by mouth daily. 6 tabs PO x 2 days 5 tabs PO x 2 days 4 tabs PO x 2 days 3 tabs PO x 2 days 2 tabs PO x 2 days 1 tab PO x 2 days and stop   pregabalin 75 MG capsule Commonly known as:  LYRICA Take 75 mg by mouth 2 (two) times daily.   senna-docusate 8.6-50 MG tablet Commonly known as:  Senokot-S Take 1 tablet by mouth 2 (two) times daily.   SYMBICORT 160-4.5 MCG/ACT inhaler Generic drug:  budesonide-formoterol Inhale 2 puffs into the lungs 2 (two) times daily.   tiotropium 18 MCG inhalation capsule Commonly known as:  SPIRIVA Place 18 mcg into inhaler and inhale daily.   topiramate 200 MG tablet Commonly known as:  TOPAMAX Take 200 mg by mouth 2 (two) times daily.   vitamin B-12 500 MCG tablet Commonly known as:  CYANOCOBALAMIN Take 500  mcg by mouth daily.            Durable Medical Equipment  (From admission, onward)        Start     Ordered   02/12/17 1036  For home use only DME oxygen  Once    Question Answer Comment  Mode or (Route) Nasal cannula   Liters per Minute 2   Frequency Continuous (stationary and portable oxygen unit needed)   Oxygen conserving device Yes   Oxygen delivery system Gas      02/12/17 1036      Vitals:   02/12/17 0557 02/12/17 0722  BP: 134/74   Pulse: 69   Resp: 20   Temp: 98.2 F (36.8 C)   SpO2: 100% 95%    Skin clean, dry and intact without evidence of skin break down, no evidence of skin tears noted. IV catheter discontinued intact. Site without signs and symptoms of complications. Dressing and  pressure applied. Pt denies pain at this time. No complaints noted.  An After Visit Summary was printed and given to the patient. Patient escorted via WC, and D/C home via private auto.  Brenda Wu

## 2017-02-12 NOTE — Discharge Summary (Signed)
[facility] [place] at [facility]   PATIENT NAME: Brenda Wu    MR#:  [id number]  DATE OF BIRTH:  06/17/1955  DATE OF ADMISSION:  02/07/2017   ADMITTING PHYSICIAN: Shaune Pollack, MD  DATE OF DISCHARGE: 02/12/17  PRIMARY CARE PHYSICIAN: Cheron Schaumann., MD   ADMISSION DIAGNOSIS:   COPD exacerbation (HCC) [J44.1] Acute respiratory failure with hypoxia and hypercarbia (HCC) [J96.01, J96.02]  DISCHARGE DIAGNOSIS:   Active Problems:   COPD exacerbation (HCC)   SECONDARY DIAGNOSIS:   Past Medical History:  Diagnosis Date  . Diabetes mellitus without complication (HCC)   . Hypercholesteremia   . Hypertension   . Seizures [facility])     HOSPITAL COURSE:   61 year old female with known history of COPD on when necessary oxygen only at home, diabetes mellitus, hypertension, seizure disorder, sleep apnea and history of stroke with no neurological deficits presents to the hospital secondary to hypoxic respiratory failure.  1. Acute on chronic respiratory failure-secondary to COPD exacerbation. -Chest x-ray with no infiltrate noted.  on IV Solu-Medrol- changed to 12 day prednisone taper at home - Patient only uses 2l when necessary oxygen at home. Will need continuous oxygen from now on. Explained that to patient and her husband. -Here she is needing 2-3 L O2 -Also started on Lasix for symptomatic improvement. Discontinued hydrochlorothiazide. -Chest x-ray with cardiomegaly and pulmonary vascular congestion. CT of the chest negative for PE. -Echo ordered to rule out diastolic dysfunction, pulmonary hypertension, valvular abnormalities etc. -Continue nebulizers, inhalers. -Incentive spirometer encouraged -Outpatient cardiology follow-up given, might need a stress test as an outpatient if dyspnea does not improve  2. Uncontrolled diabetes mellitus-worsened due to being on steroids. -On home regimen of glipizide, Januvia and Lantus which will be  continued -Appreciate diabetes coordinator's input.   3. Sleep apnea and obesity-continue CPAP at bedtime  4. Seizure disorder-stable. Continue home medications. On Lamictal and Topamax at home  Patient will be discharged home today as she is close to her baseline    DISCHARGE CONDITIONS:   Guarded  CONSULTS OBTAINED:   None  DRUG ALLERGIES:   Allergies  Allergen Reactions  . Metformin Other (See Comments)    Other reaction(s): Other (See Comments) Constipation, dry mouth, dizziness  . Bismuth Subsalicylate Rash    Pepto Bismol   DISCHARGE MEDICATIONS:   Allergies as of 02/12/2017      Reactions   Metformin Other (See Comments)   Other reaction(s): Other (See Comments) Constipation, dry mouth, dizziness   Bismuth Subsalicylate Rash   Pepto Bismol      Medication List    STOP taking these medications   hydrochlorothiazide 25 MG tablet Commonly known as:  HYDRODIURIL   Potassium 99 MG Tabs     TAKE these medications   albuterol 108 (90 Base) MCG/ACT inhaler Commonly known as:  PROVENTIL HFA;VENTOLIN HFA Inhale 2 puffs into the lungs 4 (four) times daily as needed for wheezing or shortness of breath.   albuterol (2.5 MG/3ML) 0.083% nebulizer solution Commonly known as:  PROVENTIL Inhale 2.5 mg into the lungs every 6 (six) hours as needed for wheezing or shortness of breath.   atorvastatin 40 MG tablet Commonly known as:  LIPITOR Take 40 mg by mouth daily.   diltiazem 60 MG 12 hr capsule Commonly known as:  CARDIZEM SR Take 60 mg by mouth daily.   ferrous sulfate 325 (65 FE) MG tablet Take 325 mg by mouth daily with breakfast.   furosemide 40 MG tablet  Commonly known as:  LASIX Take 1 tablet (40 mg total) by mouth daily.   glipiZIDE 5 MG tablet Commonly known as:  GLUCOTROL Take 5 mg by mouth 2 (two) times daily before a meal.   GOODSENSE ASPIRIN 325 MG tablet Generic drug:  aspirin Take 325 mg by mouth daily.   guaiFENesin 600 MG 12 hr  tablet Commonly known as:  MUCINEX Take 2 tablets (1,200 mg total) by mouth 2 (two) times daily.   JANUVIA 50 MG tablet Generic drug:  sitaGLIPtin Take 50 mg by mouth daily.   lamoTRIgine 200 MG tablet Commonly known as:  LAMICTAL Take 200 mg by mouth 2 (two) times daily.   LANTUS SOLOSTAR 100 UNIT/ML Solostar Pen Generic drug:  Insulin Glargine Inject 15 Units into the skin daily.   losartan 50 MG tablet Commonly known as:  COZAAR Take 50 mg by mouth daily.   MULTI-VITAMINS Tabs Take 1 tablet by mouth daily.   niacin 500 MG CR capsule Take 500 mg by mouth daily.   potassium chloride SA 20 MEQ tablet Commonly known as:  K-DUR,KLOR-CON Take 1 tablet (20 mEq total) by mouth daily.   predniSONE 10 MG tablet Commonly known as:  DELTASONE Take 1 tablet (10 mg total) by mouth daily. 6 tabs PO x 2 days 5 tabs PO x 2 days 4 tabs PO x 2 days 3 tabs PO x 2 days 2 tabs PO x 2 days 1 tab PO x 2 days and stop   pregabalin 75 MG capsule Commonly known as:  LYRICA Take 75 mg by mouth 2 (two) times daily.   senna-docusate 8.6-50 MG tablet Commonly known as:  Senokot-S Take 1 tablet by mouth 2 (two) times daily.   SYMBICORT 160-4.5 MCG/ACT inhaler Generic drug:  budesonide-formoterol Inhale 2 puffs into the lungs 2 (two) times daily.   tiotropium 18 MCG inhalation capsule Commonly known as:  SPIRIVA Place 18 mcg into inhaler and inhale daily.   topiramate 200 MG tablet Commonly known as:  TOPAMAX Take 200 mg by mouth 2 (two) times daily.   vitamin B-12 500 MCG tablet Commonly known as:  CYANOCOBALAMIN Take 500 mcg by mouth daily.        DISCHARGE INSTRUCTIONS:   1. PCP f/u in 1-2 weeks 2. Pulmonary f/u in 2 weeks 3, Cardiology f/u for outpatient stress test  DIET:   Cardiac diet and Diabetic diet  ACTIVITY:   Activity as tolerated  OXYGEN:   Home Oxygen: Yes.    Oxygen Delivery: 2 liters/min via Patient connected to nasal cannula oxygen  DISCHARGE  LOCATION:   home   If you experience worsening of your admission symptoms, develop shortness of breath, life threatening emergency, suicidal or homicidal thoughts you must seek medical attention immediately by calling 911 or calling your MD immediately  if symptoms less severe.  You Must read complete instructions/literature along with all the possible adverse reactions/side effects for all the Medicines you take and that have been prescribed to you. Take any new Medicines after you have completely understood and accpet all the possible adverse reactions/side effects.   Please note  You were cared for by a hospitalist during your hospital stay. If you have any questions about your discharge medications or the care you received while you were in the hospital after you are discharged, you can call the unit and asked to speak with the hospitalist on call if the hospitalist that took care of you is not available. Once you are  discharged, your primary care physician will handle any further medical issues. Please note that NO REFILLS for any discharge medications will be authorized once you are discharged, as it is imperative that you return to your primary care physician (or establish a relationship with a primary care physician if you do not have one) for your aftercare needs so that they can reassess your need for medications and monitor your lab values.    On the day of Discharge:  VITAL SIGNS:   Blood pressure 134/74, pulse 69, temperature 98.2 F (36.8 C), temperature source Oral, resp. rate 20, height 5\' 4"  (1.626 m), weight 103.9 kg (229 lb), SpO2 95 %.  PHYSICAL EXAMINATION:    GENERAL:  61 y.o.-year-old obese patient lying in the bed with no acute distress. Tachypneic with exertion EYES: Pupils equal, round, reactive to light and accommodation. No scleral icterus. Extraocular muscles intact.  HEENT: Head atraumatic, normocephalic. Oropharynx and nasopharynx clear.  NECK:  Supple, no  jugular venous distention. No thyroid enlargement, no tenderness.  LUNGS: Moving air bilaterally, decreased at the bases. Occasional Scattered wheezing with occasional rhonchi. Not using accessory muscles to breathe.  CARDIOVASCULAR: S1, S2 normal. No murmurs, rubs, or gallops.  ABDOMEN: Soft, nontender, nondistended. Bowel sounds present. No organomegaly or mass.  EXTREMITIES: No cyanosis, or clubbing. 1+ pedal edema noted NEUROLOGIC: Cranial nerves II through XII are intact. Muscle strength 5/5 in all extremities. Sensation intact. Gait not checked.  PSYCHIATRIC: The patient is alert and oriented x 3.  SKIN: No obvious rash, lesion, or ulcer.    DATA REVIEW:   CBC Recent Labs  Lab 02/11/17 0425  WBC 7.7  HGB 16.3*  HCT 52.9*  PLT 137*    Chemistries  Recent Labs  Lab 02/07/17 0442  02/12/17 0352  NA 139   < > 139  K 4.3   < > 3.3*  CL 102   < > 96*  CO2 29   < > 38*  GLUCOSE 190*   < > 80  BUN 17   < > 29*  CREATININE 1.24*   < > 0.96  CALCIUM 9.2   < > 9.4  MG 2.0  --   --   AST 37  --  17  ALT 42  --  30  ALKPHOS 109  --  78  BILITOT 0.9  --  0.6   < > = values in this interval not displayed.     Microbiology Results  Results for orders placed or performed during the hospital encounter of 11/01/16  MRSA PCR Screening     Status: None   Collection Time: 11/01/16  9:09 PM  Result Value Ref Range Status   MRSA by PCR NEGATIVE NEGATIVE Final    Comment:        The GeneXpert MRSA Assay (FDA approved for NASAL specimens only), is one component of a comprehensive MRSA colonization surveillance program. It is not intended to diagnose MRSA infection nor to guide or monitor treatment for MRSA infections.     RADIOLOGY:  No results found.   Management plans discussed with the patient, family and they are in agreement.  CODE STATUS:     Code Status Orders  (From admission, onward)        Start     Ordered   02/07/17 0909  Full code  Continuous       02/07/17 0908    Code Status History    Date Active Date Inactive Code Status Order ID  Comments User Context   11/01/2016 17:26 11/04/2016 14:47 Full Code [id number]  Gracelyn NurseJohnston, John D, MD Inpatient      TOTAL TIME TAKING CARE OF THIS PATIENT:38 minutes.    Enid BaasKALISETTI,Jameer Storie M.D on 02/12/2017 at 10:34 AM  Between 7am to 6pm - Pager - [phone]  After 6pm go to [email or web address] - Social research officer, [email or web address] [email or web address]  [facility] [place] [facility]  Office  [phone]  CC: Primary care physician; Cheron SchaumannVelazquez, Gretchen Y., MD   Note: This dictation was prepared with Dragon dictation along with smaller phrase technology. Any transcriptional errors that result from this process are unintentional.

## 2017-11-04 ENCOUNTER — Encounter: Payer: Self-pay | Admitting: Emergency Medicine

## 2017-11-04 ENCOUNTER — Inpatient Hospital Stay
Admission: EM | Admit: 2017-11-04 | Discharge: 2017-11-09 | DRG: 193 | Disposition: A | Discharge status: Home Health | Payer: Medicare HMO | Attending: Family Medicine | Admitting: Family Medicine

## 2017-11-04 ENCOUNTER — Other Ambulatory Visit: Payer: Self-pay

## 2017-11-04 ENCOUNTER — Emergency Department: Payer: Medicare HMO

## 2017-11-04 DIAGNOSIS — Z7982 Long term (current) use of aspirin: Secondary | ICD-10-CM

## 2017-11-04 DIAGNOSIS — J9621 Acute and chronic respiratory failure with hypoxia: Secondary | ICD-10-CM | POA: Diagnosis present

## 2017-11-04 DIAGNOSIS — J441 Chronic obstructive pulmonary disease with (acute) exacerbation: Secondary | ICD-10-CM | POA: Diagnosis present

## 2017-11-04 DIAGNOSIS — Z79899 Other long term (current) drug therapy: Secondary | ICD-10-CM | POA: Diagnosis not present

## 2017-11-04 DIAGNOSIS — I5043 Acute on chronic combined systolic (congestive) and diastolic (congestive) heart failure: Secondary | ICD-10-CM | POA: Diagnosis present

## 2017-11-04 DIAGNOSIS — J9690 Respiratory failure, unspecified, unspecified whether with hypoxia or hypercapnia: Secondary | ICD-10-CM | POA: Diagnosis present

## 2017-11-04 DIAGNOSIS — Z87891 Personal history of nicotine dependence: Secondary | ICD-10-CM | POA: Diagnosis not present

## 2017-11-04 DIAGNOSIS — J189 Pneumonia, unspecified organism: Secondary | ICD-10-CM | POA: Diagnosis not present

## 2017-11-04 DIAGNOSIS — G40909 Epilepsy, unspecified, not intractable, without status epilepticus: Secondary | ICD-10-CM | POA: Diagnosis present

## 2017-11-04 DIAGNOSIS — Z9981 Dependence on supplemental oxygen: Secondary | ICD-10-CM | POA: Diagnosis not present

## 2017-11-04 DIAGNOSIS — Z794 Long term (current) use of insulin: Secondary | ICD-10-CM

## 2017-11-04 DIAGNOSIS — Z96659 Presence of unspecified artificial knee joint: Secondary | ICD-10-CM | POA: Diagnosis present

## 2017-11-04 DIAGNOSIS — Z7951 Long term (current) use of inhaled steroids: Secondary | ICD-10-CM

## 2017-11-04 DIAGNOSIS — J9622 Acute and chronic respiratory failure with hypercapnia: Secondary | ICD-10-CM | POA: Diagnosis present

## 2017-11-04 DIAGNOSIS — G4733 Obstructive sleep apnea (adult) (pediatric): Secondary | ICD-10-CM | POA: Diagnosis present

## 2017-11-04 DIAGNOSIS — J440 Chronic obstructive pulmonary disease with acute lower respiratory infection: Secondary | ICD-10-CM | POA: Diagnosis present

## 2017-11-04 DIAGNOSIS — E119 Type 2 diabetes mellitus without complications: Secondary | ICD-10-CM | POA: Diagnosis present

## 2017-11-04 DIAGNOSIS — E7800 Pure hypercholesterolemia, unspecified: Secondary | ICD-10-CM | POA: Diagnosis present

## 2017-11-04 DIAGNOSIS — I340 Nonrheumatic mitral (valve) insufficiency: Secondary | ICD-10-CM | POA: Diagnosis not present

## 2017-11-04 DIAGNOSIS — I110 Hypertensive heart disease with heart failure: Secondary | ICD-10-CM | POA: Diagnosis present

## 2017-11-04 DIAGNOSIS — E785 Hyperlipidemia, unspecified: Secondary | ICD-10-CM | POA: Diagnosis present

## 2017-11-04 DIAGNOSIS — R0902 Hypoxemia: Secondary | ICD-10-CM

## 2017-11-04 DIAGNOSIS — J9600 Acute respiratory failure, unspecified whether with hypoxia or hypercapnia: Secondary | ICD-10-CM

## 2017-11-04 DIAGNOSIS — R0602 Shortness of breath: Secondary | ICD-10-CM

## 2017-11-04 DIAGNOSIS — I509 Heart failure, unspecified: Secondary | ICD-10-CM

## 2017-11-04 DIAGNOSIS — Z888 Allergy status to other drugs, medicaments and biological substances status: Secondary | ICD-10-CM | POA: Diagnosis not present

## 2017-11-04 DIAGNOSIS — R7989 Other specified abnormal findings of blood chemistry: Secondary | ICD-10-CM | POA: Diagnosis present

## 2017-11-04 DIAGNOSIS — I248 Other forms of acute ischemic heart disease: Secondary | ICD-10-CM | POA: Diagnosis present

## 2017-11-04 DIAGNOSIS — J9601 Acute respiratory failure with hypoxia: Secondary | ICD-10-CM | POA: Diagnosis not present

## 2017-11-04 HISTORY — DX: Chronic obstructive pulmonary disease, unspecified: J44.9

## 2017-11-04 LAB — HEMOGLOBIN A1C
Hgb A1c MFr Bld: 7.8 % — ABNORMAL HIGH (ref 4.8–5.6)
MEAN PLASMA GLUCOSE: 177.16 mg/dL

## 2017-11-04 LAB — TROPONIN I
Troponin I: 0.09 ng/mL (ref <0.03)
Troponin I: 0.1 ng/mL (ref <0.03)
Troponin I: 0.09 ng/mL (ref <0.03)

## 2017-11-04 LAB — GLUCOSE, CAPILLARY
GLUCOSE-CAPILLARY: 89 mg/dL (ref 70–99)
GLUCOSE-CAPILLARY: 166 mg/dL — AB (ref 70–99)

## 2017-11-04 LAB — CBC
HCT: 53.4 % — ABNORMAL HIGH (ref 35.0–47.0)
HEMATOCRIT: 52.9 % — AB (ref 35.0–47.0)
Hemoglobin: 16.7 g/dL — ABNORMAL HIGH (ref 12.0–16.0)
Hemoglobin: 16.7 g/dL — ABNORMAL HIGH (ref 12.0–16.0)
MCH: 30.8 pg (ref 26.0–34.0)
MCH: 30.8 pg (ref 26.0–34.0)
MCHC: 31.5 g/dL — AB (ref 32.0–36.0)
MCHC: 31.3 g/dL — ABNORMAL LOW (ref 32.0–36.0)
MCV: 97.7 fL (ref 80.0–100.0)
MCV: 98.4 fL (ref 80.0–100.0)
PLATELETS: 92 10*3/uL — AB (ref 150–440)
Platelets: 81 10*3/uL — ABNORMAL LOW (ref 150–440)
RBC: 5.41 MIL/uL — ABNORMAL HIGH (ref 3.80–5.20)
RBC: 5.42 MIL/uL — AB (ref 3.80–5.20)
RDW: 16.6 % — ABNORMAL HIGH (ref 11.5–14.5)
RDW: 16.8 % — ABNORMAL HIGH (ref 11.5–14.5)
WBC: 5.4 10*3/uL (ref 3.6–11.0)
WBC: 5.2 10*3/uL (ref 3.6–11.0)

## 2017-11-04 LAB — COMPREHENSIVE METABOLIC PANEL
ALBUMIN: 3.9 g/dL (ref 3.5–5.0)
ALK PHOS: 110 U/L (ref 38–126)
ALT: 20 U/L (ref 0–44)
ANION GAP: 10 (ref 5–15)
AST: 24 U/L (ref 15–41)
BILIRUBIN TOTAL: 1.1 mg/dL (ref 0.3–1.2)
BUN: 13 mg/dL (ref 8–23)
CALCIUM: 9.1 mg/dL (ref 8.9–10.3)
CO2: 28 mmol/L (ref 22–32)
Chloride: 100 mmol/L (ref 98–111)
Creatinine, Ser: 1.52 mg/dL — ABNORMAL HIGH (ref 0.44–1.00)
GFR, EST AFRICAN AMERICAN: 42 mL/min — AB (ref >60)
GFR, EST NON AFRICAN AMERICAN: 36 mL/min — AB (ref >60)
GLUCOSE: 103 mg/dL — AB (ref 70–99)
Potassium: 3.8 mmol/L (ref 3.5–5.1)
Sodium: 138 mmol/L (ref 135–145)
TOTAL PROTEIN: 6.7 g/dL (ref 6.5–8.1)

## 2017-11-04 LAB — CREATININE, SERUM
Creatinine, Ser: 1.62 mg/dL — ABNORMAL HIGH (ref 0.44–1.00)
GFR calc Af Amer: 39 mL/min — ABNORMAL LOW (ref >60)
GFR calc non Af Amer: 33 mL/min — ABNORMAL LOW (ref >60)

## 2017-11-04 LAB — BRAIN NATRIURETIC PEPTIDE: B NATRIURETIC PEPTIDE 5: 909 pg/mL — AB (ref 0.0–100.0)

## 2017-11-04 MED ORDER — SODIUM CHLORIDE 0.9 % IV SOLN
1.0000 g | Freq: Once | INTRAVENOUS | Status: AC
  Administered 2017-11-04: 1 g via INTRAVENOUS
  Filled 2017-11-04: qty 10

## 2017-11-04 MED ORDER — IPRATROPIUM-ALBUTEROL 0.5-2.5 (3) MG/3ML IN SOLN
3.0000 mL | Freq: Once | RESPIRATORY_TRACT | Status: AC
  Administered 2017-11-04: 3 mL via RESPIRATORY_TRACT
  Filled 2017-11-04: qty 6

## 2017-11-04 MED ORDER — HYDROCODONE-ACETAMINOPHEN 5-325 MG PO TABS: 1.0000 | ORAL_TABLET | ORAL | Status: DC | PRN

## 2017-11-04 MED ORDER — SENNOSIDES-DOCUSATE SODIUM 8.6-50 MG PO TABS
1.0000 | ORAL_TABLET | Freq: Two times a day (BID) | ORAL | Status: DC
  Administered 2017-11-05 – 2017-11-08 (×6): 1 via ORAL
  Filled 2017-11-04 (×7): qty 1

## 2017-11-04 MED ORDER — SODIUM CHLORIDE 0.9% FLUSH: 3.0000 mL | INTRAVENOUS | Status: DC | PRN

## 2017-11-04 MED ORDER — IPRATROPIUM-ALBUTEROL 0.5-2.5 (3) MG/3ML IN SOLN
3.0000 mL | Freq: Once | RESPIRATORY_TRACT | Status: AC
  Administered 2017-11-04: 3 mL via RESPIRATORY_TRACT

## 2017-11-04 MED ORDER — FERROUS SULFATE 325 (65 FE) MG PO TABS
325.0000 mg | ORAL_TABLET | Freq: Every day | ORAL | Status: DC
  Administered 2017-11-05 – 2017-11-08 (×3): 325 mg via ORAL
  Filled 2017-11-04 (×4): qty 1

## 2017-11-04 MED ORDER — FUROSEMIDE 10 MG/ML IJ SOLN
60.0000 mg | Freq: Once | INTRAMUSCULAR | Status: AC
  Administered 2017-11-04: 60 mg via INTRAVENOUS
  Filled 2017-11-04: qty 8

## 2017-11-04 MED ORDER — ONDANSETRON HCL 4 MG PO TABS: 4.0000 mg | ORAL_TABLET | Freq: Four times a day (QID) | ORAL | Status: DC | PRN

## 2017-11-04 MED ORDER — GUAIFENESIN ER 600 MG PO TB12
1200.0000 mg | ORAL_TABLET | Freq: Two times a day (BID) | ORAL | Status: DC
  Administered 2017-11-05 – 2017-11-08 (×7): 1200 mg via ORAL
  Filled 2017-11-04 (×8): qty 2

## 2017-11-04 MED ORDER — METHYLPREDNISOLONE SODIUM SUCC 125 MG IJ SOLR
60.0000 mg | INTRAMUSCULAR | Status: DC
  Administered 2017-11-05 – 2017-11-08 (×4): 60 mg via INTRAVENOUS
  Filled 2017-11-04 (×4): qty 2

## 2017-11-04 MED ORDER — LAMOTRIGINE 100 MG PO TABS
200.0000 mg | ORAL_TABLET | Freq: Two times a day (BID) | ORAL | Status: DC
  Administered 2017-11-04 – 2017-11-08 (×7): 200 mg via ORAL
  Filled 2017-11-04: qty 8
  Filled 2017-11-04 (×2): qty 2
  Filled 2017-11-04 (×2): qty 8
  Filled 2017-11-04 (×4): qty 2

## 2017-11-04 MED ORDER — ONDANSETRON HCL 4 MG/2ML IJ SOLN: 4.0000 mg | Freq: Four times a day (QID) | INTRAMUSCULAR | Status: DC | PRN

## 2017-11-04 MED ORDER — ENOXAPARIN SODIUM 40 MG/0.4ML ~~LOC~~ SOLN
40.0000 mg | Freq: Two times a day (BID) | SUBCUTANEOUS | Status: DC
  Administered 2017-11-04 – 2017-11-08 (×9): 40 mg via SUBCUTANEOUS
  Filled 2017-11-04 (×10): qty 0.4

## 2017-11-04 MED ORDER — LOSARTAN POTASSIUM 50 MG PO TABS
50.0000 mg | ORAL_TABLET | Freq: Every day | ORAL | Status: DC
  Administered 2017-11-05 – 2017-11-08 (×3): 50 mg via ORAL
  Filled 2017-11-04 (×5): qty 1

## 2017-11-04 MED ORDER — SODIUM CHLORIDE 0.9 % IV SOLN: 250.0000 mL | INTRAVENOUS | Status: DC | PRN

## 2017-11-04 MED ORDER — SODIUM CHLORIDE 0.9% FLUSH
3.0000 mL | Freq: Two times a day (BID) | INTRAVENOUS | Status: DC
  Administered 2017-11-04 – 2017-11-08 (×10): 3 mL via INTRAVENOUS

## 2017-11-04 MED ORDER — SODIUM CHLORIDE 0.9 % IV SOLN
1.0000 g | INTRAVENOUS | Status: DC
  Administered 2017-11-05 – 2017-11-08 (×4): 1 g via INTRAVENOUS
  Filled 2017-11-04: qty 10
  Filled 2017-11-04: qty 1
  Filled 2017-11-04: qty 10
  Filled 2017-11-04 (×3): qty 1

## 2017-11-04 MED ORDER — INSULIN GLARGINE 100 UNIT/ML ~~LOC~~ SOLN
15.0000 [IU] | Freq: Every day | SUBCUTANEOUS | Status: DC
  Administered 2017-11-04 – 2017-11-08 (×4): 15 [IU] via SUBCUTANEOUS
  Filled 2017-11-04 (×6): qty 0.15

## 2017-11-04 MED ORDER — ASPIRIN EC 325 MG PO TBEC
325.0000 mg | DELAYED_RELEASE_TABLET | Freq: Every day | ORAL | Status: DC
  Administered 2017-11-05 – 2017-11-08 (×4): 325 mg via ORAL
  Filled 2017-11-04 (×5): qty 1

## 2017-11-04 MED ORDER — FUROSEMIDE 10 MG/ML IJ SOLN
INTRAMUSCULAR | Status: AC
  Administered 2017-11-04: 15:00:00
  Filled 2017-11-04: qty 4

## 2017-11-04 MED ORDER — TIOTROPIUM BROMIDE MONOHYDRATE 18 MCG IN CAPS
18.0000 ug | ORAL_CAPSULE | Freq: Every day | RESPIRATORY_TRACT | Status: DC
  Administered 2017-11-06 – 2017-11-08 (×3): 18 ug via RESPIRATORY_TRACT
  Filled 2017-11-04 (×2): qty 5

## 2017-11-04 MED ORDER — POTASSIUM CHLORIDE CRYS ER 20 MEQ PO TBCR
20.0000 meq | EXTENDED_RELEASE_TABLET | Freq: Every day | ORAL | Status: DC
  Administered 2017-11-05 – 2017-11-08 (×4): 20 meq via ORAL
  Filled 2017-11-04 (×5): qty 1

## 2017-11-04 MED ORDER — INSULIN ASPART 100 UNIT/ML ~~LOC~~ SOLN
0.0000 [IU] | Freq: Three times a day (TID) | SUBCUTANEOUS | Status: DC
  Administered 2017-11-05 – 2017-11-08 (×3): 4 [IU] via SUBCUTANEOUS
  Filled 2017-11-04 (×3): qty 1

## 2017-11-04 MED ORDER — IPRATROPIUM-ALBUTEROL 0.5-2.5 (3) MG/3ML IN SOLN
3.0000 mL | RESPIRATORY_TRACT | Status: DC
  Administered 2017-11-04 – 2017-11-08 (×22): 3 mL via RESPIRATORY_TRACT
  Filled 2017-11-04 (×21): qty 3

## 2017-11-04 MED ORDER — METHYLPREDNISOLONE SODIUM SUCC 125 MG IJ SOLR
125.0000 mg | Freq: Once | INTRAMUSCULAR | Status: AC
  Administered 2017-11-04: 125 mg via INTRAVENOUS
  Filled 2017-11-04: qty 2

## 2017-11-04 MED ORDER — AZITHROMYCIN 500 MG IV SOLR
500.0000 mg | Freq: Once | INTRAVENOUS | Status: AC
  Administered 2017-11-04: 500 mg via INTRAVENOUS
  Filled 2017-11-04: qty 500

## 2017-11-04 MED ORDER — GLIPIZIDE 5 MG PO TABS
5.0000 mg | ORAL_TABLET | Freq: Two times a day (BID) | ORAL | Status: DC
  Administered 2017-11-05 – 2017-11-08 (×6): 5 mg via ORAL
  Filled 2017-11-04 (×11): qty 1

## 2017-11-04 MED ORDER — INSULIN ASPART 100 UNIT/ML ~~LOC~~ SOLN: 0.0000 [IU] | Freq: Every day | SUBCUTANEOUS | Status: DC

## 2017-11-04 MED ORDER — ALBUTEROL SULFATE HFA 108 (90 BASE) MCG/ACT IN AERS: 2.0000 | INHALATION_SPRAY | Freq: Four times a day (QID) | RESPIRATORY_TRACT | Status: DC | PRN

## 2017-11-04 MED ORDER — ACETAMINOPHEN 650 MG RE SUPP: 650.0000 mg | Freq: Four times a day (QID) | RECTAL | Status: DC | PRN

## 2017-11-04 MED ORDER — ATORVASTATIN CALCIUM 20 MG PO TABS
40.0000 mg | ORAL_TABLET | Freq: Every day | ORAL | Status: DC
  Administered 2017-11-05 – 2017-11-08 (×4): 40 mg via ORAL
  Filled 2017-11-04 (×6): qty 2

## 2017-11-04 MED ORDER — LINAGLIPTIN 5 MG PO TABS
5.0000 mg | ORAL_TABLET | Freq: Every day | ORAL | Status: DC
Start: 2017-11-04 — End: 2017-11-09
  Administered 2017-11-04 – 2017-11-08 (×3): 5 mg via ORAL
  Filled 2017-11-04 (×6): qty 1

## 2017-11-04 MED ORDER — ALBUTEROL SULFATE (2.5 MG/3ML) 0.083% IN NEBU
2.5000 mg | INHALATION_SOLUTION | Freq: Four times a day (QID) | RESPIRATORY_TRACT | Status: DC | PRN
  Administered 2017-11-04: 2.5 mg via RESPIRATORY_TRACT
  Filled 2017-11-04: qty 3

## 2017-11-04 MED ORDER — NIACIN ER 500 MG PO TBCR
500.0000 mg | EXTENDED_RELEASE_TABLET | Freq: Every day | ORAL | Status: DC
  Administered 2017-11-05 – 2017-11-08 (×4): 500 mg via ORAL
  Filled 2017-11-04 (×6): qty 1

## 2017-11-04 MED ORDER — PREGABALIN 75 MG PO CAPS
75.0000 mg | ORAL_CAPSULE | Freq: Two times a day (BID) | ORAL | Status: DC
  Administered 2017-11-05 – 2017-11-08 (×7): 75 mg via ORAL
  Filled 2017-11-04 (×8): qty 1

## 2017-11-04 MED ORDER — ADULT MULTIVITAMIN W/MINERALS CH
1.0000 | ORAL_TABLET | Freq: Every day | ORAL | Status: DC
  Administered 2017-11-06 – 2017-11-08 (×3): 1 via ORAL
  Filled 2017-11-04 (×4): qty 1

## 2017-11-04 MED ORDER — SENNOSIDES-DOCUSATE SODIUM 8.6-50 MG PO TABS
1.0000 | ORAL_TABLET | Freq: Every evening | ORAL | Status: DC | PRN
  Administered 2017-11-07: 1 via ORAL

## 2017-11-04 MED ORDER — SODIUM CHLORIDE 0.9 % IV SOLN
500.0000 mg | INTRAVENOUS | Status: DC
  Administered 2017-11-05 – 2017-11-07 (×3): 500 mg via INTRAVENOUS
  Filled 2017-11-04 (×6): qty 500

## 2017-11-04 MED ORDER — ACETAMINOPHEN 325 MG PO TABS: 650.0000 mg | ORAL_TABLET | Freq: Four times a day (QID) | ORAL | Status: DC | PRN

## 2017-11-04 MED ORDER — IPRATROPIUM-ALBUTEROL 0.5-2.5 (3) MG/3ML IN SOLN
3.0000 mL | RESPIRATORY_TRACT | Status: DC
Start: 2017-11-04 — End: 2017-11-04

## 2017-11-04 MED ORDER — TOPIRAMATE 100 MG PO TABS
200.0000 mg | ORAL_TABLET | Freq: Two times a day (BID) | ORAL | Status: DC
  Administered 2017-11-04 – 2017-11-08 (×8): 200 mg via ORAL
  Filled 2017-11-04 (×2): qty 8
  Filled 2017-11-04 (×2): qty 2
  Filled 2017-11-04 (×2): qty 8
  Filled 2017-11-04 (×8): qty 2
  Filled 2017-11-04: qty 8

## 2017-11-04 MED ORDER — MOMETASONE FURO-FORMOTEROL FUM 200-5 MCG/ACT IN AERO
2.0000 | INHALATION_SPRAY | Freq: Two times a day (BID) | RESPIRATORY_TRACT | Status: DC
  Administered 2017-11-04 – 2017-11-08 (×9): 2 via RESPIRATORY_TRACT
  Filled 2017-11-04 (×2): qty 8.8

## 2017-11-04 MED ORDER — FUROSEMIDE 10 MG/ML IJ SOLN
40.0000 mg | Freq: Two times a day (BID) | INTRAMUSCULAR | Status: DC
  Administered 2017-11-04 – 2017-11-09 (×11): 40 mg via INTRAVENOUS
  Filled 2017-11-04 (×12): qty 4

## 2017-11-04 MED ORDER — DILTIAZEM HCL ER 60 MG PO CP12
60.0000 mg | ORAL_CAPSULE | Freq: Every day | ORAL | Status: DC
  Administered 2017-11-05 – 2017-11-08 (×4): 60 mg via ORAL
  Filled 2017-11-04 (×6): qty 1

## 2017-11-04 MED ORDER — VITAMIN B-12 1000 MCG PO TABS
500.0000 ug | ORAL_TABLET | Freq: Every day | ORAL | Status: DC
  Administered 2017-11-06 – 2017-11-08 (×3): 500 ug via ORAL
  Filled 2017-11-04 (×4): qty 1

## 2017-11-04 NOTE — ED Provider Notes (Signed)
[facility] Emergency Department Provider Note  Time seen: 2:05 PM  I have reviewed the triage vital signs and the nursing notes.   HISTORY  Chief Complaint Shortness of Breath    HPI Female Masis is a 62 y.o. female with a past medical history of diabetes, hyperlipidemia, hypertension, COPD, CHF on 2 L of oxygen 24/7 presents to the emergency department for worsening shortness of breath.  According to the patient over the past 1 month she has had progressively worsening shortness of breath.  States it has been coming significant and worse over the past several days.  She is also noticed significant increase in lower extremity edema.  Has been coughing with yellow sputum production has subjective fever at home, afebrile currently.  Upon arrival patient satting 82% on 2 L of oxygen.  Patient is awake alert, no distress.   Past Medical History:  Diagnosis Date  . Diabetes mellitus without complication (HCC)   . Hypercholesteremia   . Hypertension   . Seizures [facility])     Patient Active Problem List   Diagnosis Date Noted  . COPD exacerbation (HCC) 02/07/2017  . Acute on chronic respiratory failure with hypoxia and hypercapnia (HCC) 11/01/2016  . COPD with acute exacerbation (HCC) 03/12/2016  . History of cerebellar stroke 03/12/2016  . History of seizures 03/12/2016  . Pulmonary nodule, left 01/05/2016  . Paroxysmal atrial fibrillation (HCC) 10/17/2015  . Cerebellar stroke (HCC) 06/26/2015  . Transient diplopia 06/09/2015  . Asthma-chronic obstructive pulmonary disease overlap syndrome (HCC) 05/30/2015  . COPD with asthma (HCC) 05/30/2015  . Hypoxemia 05/30/2015  . Obstructive sleep apnea syndrome 05/30/2015  . OSA on CPAP 05/30/2015  . Obesity (BMI 30-39.9) 03/12/2015  . Bilateral hearing loss 01/22/2015  . Generalized seizure disorder (HCC) 01/22/2015  . Hyperlipidemia associated with type 2 diabetes mellitus (HCC) 01/22/2015  . Hypertension  associated with diabetes (HCC) 01/22/2015  . Morbid obesity due to excess calories (HCC) 01/22/2015  . Primary osteoarthritis involving multiple joints 01/22/2015  . Severe episode of recurrent major depressive disorder, without psychotic features (HCC) 01/22/2015  . Type 2 diabetes mellitus without complication, without long-term current use of insulin (HCC) 01/22/2015    History reviewed. No pertinent surgical history.  Prior to Admission medications   Medication Sig Start Date End Date Taking? Authorizing Provider  albuterol (PROVENTIL HFA;VENTOLIN HFA) 108 (90 Base) MCG/ACT inhaler Inhale 2 puffs into the lungs 4 (four) times daily as needed for wheezing or shortness of breath.  01/05/16   [provider]  albuterol (PROVENTIL) (2.5 MG/3ML) 0.083% nebulizer solution Inhale 2.5 mg into the lungs every 6 (six) hours as needed for wheezing or shortness of breath.  10/01/16   [provider]  aspirin (GOODSENSE ASPIRIN) 325 MG tablet Take 325 mg by mouth daily. 01/31/08   [provider]  atorvastatin (LIPITOR) 40 MG tablet Take 40 mg by mouth daily.    [provider]  budesonide-formoterol (SYMBICORT) 160-4.5 MCG/ACT inhaler Inhale 2 puffs into the lungs 2 (two) times daily. 05/22/15   [provider]  diltiazem (CARDIZEM SR) 60 MG 12 hr capsule Take 60 mg by mouth daily.     [provider]  ferrous sulfate 325 (65 FE) MG tablet Take 325 mg by mouth daily with breakfast.    [provider]  furosemide (LASIX) 40 MG tablet Take 1 tablet (40 mg total) by mouth daily. 02/12/17 02/12/18  [name], MD  glipiZIDE (GLUCOTROL) 5 MG tablet Take 5 mg  by mouth 2 (two) times daily before a meal.     [provider]  guaiFENesin (MUCINEX) 600 MG 12 hr tablet Take 2 tablets (1,200 mg total) by mouth 2 (two) times daily. 02/12/17   [name], MD  Insulin Glargine (LANTUS SOLOSTAR) 100 UNIT/ML Solostar Pen Inject 15 Units  into the skin daily. 01/21/17   [provider]  lamoTRIgine (LAMICTAL) 200 MG tablet Take 200 mg by mouth 2 (two) times daily.     [provider]  losartan (COZAAR) 50 MG tablet Take 50 mg by mouth daily.    [provider]  Multiple Vitamin (MULTI-VITAMINS) TABS Take 1 tablet by mouth daily.    [provider]  niacin 500 MG CR capsule Take 500 mg by mouth daily. 08/15/15   [provider]  potassium chloride SA (K-DUR,KLOR-CON) 20 MEQ tablet Take 1 tablet (20 mEq total) by mouth daily. 02/12/17   [name], MD  predniSONE (DELTASONE) 10 MG tablet Take 1 tablet (10 mg total) by mouth daily. 6 tabs PO x 2 days 5 tabs PO x 2 days 4 tabs PO x 2 days 3 tabs PO x 2 days 2 tabs PO x 2 days 1 tab PO x 2 days and stop 02/12/17   [name], MD  pregabalin (LYRICA) 75 MG capsule Take 75 mg by mouth 2 (two) times daily. 01/11/17 04/11/17  [provider]  senna-docusate (SENOKOT-S) 8.6-50 MG tablet Take 1 tablet by mouth 2 (two) times daily. 02/12/17   [name], MD  sitaGLIPtin (JANUVIA) 50 MG tablet Take 50 mg by mouth daily.    [provider]  tiotropium (SPIRIVA) 18 MCG inhalation capsule Place 18 mcg into inhaler and inhale daily.    [provider]  topiramate (TOPAMAX) 200 MG tablet Take 200 mg by mouth 2 (two) times daily.    [provider]  vitamin B-12 (CYANOCOBALAMIN) 500 MCG tablet Take 500 mcg by mouth daily.    [provider]    Allergies  Allergen Reactions  . Metformin Other (See Comments)    Other reaction(s): Other (See Comments) Constipation, dry mouth, dizziness  . Bismuth Subsalicylate Rash    Pepto Bismol    Family History  Problem Relation Age of Onset  . Hypertension Father   . Asthma Sister   . Heart murmur Sister   . Diabetes Sister     Social History Social History   Tobacco Use  . Smoking status: Former Smoker    Packs/day: 1.00    Years:  30.00    Pack years: 30.00    Types: Cigarettes  . Smokeless tobacco: Never Used  Substance Use Topics  . Alcohol use: No  . Drug use: No    Review of Systems Constitutional: Subjective fever ENT: Negative for recent illness/congestion Cardiovascular: Negative for chest pain. Respiratory: Positive for shortness of breath.  Positive for cough with yellow sputum production. Gastrointestinal: Negative for abdominal pain, vomiting Genitourinary: Negative for urinary compaints Musculoskeletal: Significant increase in lower extremity edema palpation of the past several weeks. Skin: Negative for skin complaints  Neurological: Negative for headache All other ROS negative  ____________________________________________   PHYSICAL EXAM:  VITAL SIGNS: ED Triage Vitals  Enc Vitals Group     BP 11/04/17 1244 137/78     Pulse Rate 11/04/17 1244 81     Resp 11/04/17 1244 (!) 22     Temp 11/04/17 1244 97.9 F (36.6 C)     Temp Source 11/04/17  1244 Oral     SpO2 11/04/17 1244 (!) 82 %     Weight 11/04/17 1246 240 lb (108.9 kg)     Height 11/04/17 1246 5\' 4"  (1.626 m)     Head Circumference --      Peak Flow --      Pain Score 11/04/17 1245 0     Pain Loc --      Pain Edu? --      Excl. in GC? --    Constitutional: Alert and oriented. Well appearing and in no distress. Eyes: Normal exam ENT   Head: Normocephalic and atraumatic.   Mouth/Throat: Mucous membranes are moist. Cardiovascular: Normal rate, regular rhythm. No murmur Respiratory: Mild tachypnea, patient does have expiratory wheeze bilaterally.  No obvious rhonchi.  Decreased breath sounds bilaterally. Gastrointestinal: Soft and nontender. No distention.  Musculoskeletal: 3+ lower extremity edema equal bilaterally.  Mild erythema. Neurologic:  Normal speech and language. No gross focal neurologic deficits Skin:  Skin is warm, dry and intact.  Psychiatric: Mood and affect are  normal.  ____________________________________________    EKG  EKG reviewed and interpreted by myself shows normal sinus rhythm 81 bpm with a narrow QRS, normal axis, prolonged QTC of 619 ms.  No concerning ST changes.  ____________________________________________    RADIOLOGY  Chest x-ray shows cardiomegaly hazy opacities in the right mid to lower lungs.  ____________________________________________   INITIAL IMPRESSION / ASSESSMENT AND PLAN / ED COURSE  Pertinent labs & imaging results that were available during my care of the patient were reviewed by me and considered in my medical decision making (see chart for details).  Patient presents to the emergency department for shortness of breath ongoing for 1 month worse over the past several days.  Differential includes CHF exacerbation, COPD exacerbation, pneumonia, ACS.  We will check labs, patient is maintaining around 90% on 4 L of oxygen currently.  Ultimately patient will require admission to the hospital.  Patient's chest x-ray shows hazy opacities in the right mid lower lungs this could either be due to infectious etiology versus pulmonary edema.  As the patient is stating subjective fever at home with cough yellow sputum production we will cover for community-acquired pneumonia the patient also has significant increase in lower extremity edema with hazy opacities and hypoxic we will cover with IV Lasix.  Patient receiving steroids and duo nebs for wheeze likely related to her underlying COPD.  I believe the patient's pulmonary decline is ultimately multifactorial, we will cover on multiple fronts while awaiting a more clear clinical picture.  The patient's labs have resulted showing a elevated troponin 0.09.  Patient did briefly desat requiring nonrebreather mask we will attempt to get the patient back on nasal cannula.  I have a suspect CHF exacerbation possibly with underlying infectious component.  Patient will be admitted to  the hospital service for continued treatment. ____________________________________________   FINAL CLINICAL IMPRESSION(S) / ED DIAGNOSES  Hypoxia Dyspnea CAP CHF    Minna Antis, MD 11/04/17 1510

## 2017-11-04 NOTE — Progress Notes (Signed)
   11/04/17 1845  Clinical Encounter Type  Visited With Patient  Visit Type Initial   Chaplain visited with patient to offer Advance Directive education via request received in Order [employer], but patient states she did not request this and is not interested.

## 2017-11-04 NOTE — ED Triage Notes (Signed)
First Nurse Note:  C/O SOB x 2-3 days.  Patient wears 2l/[place] home O2.  Did not arrive on oxygen because home O2 "leaks".  Patient placed on 2l/ [place].  Patient is AAOx3.  Skin warm and dry.  NAD

## 2017-11-04 NOTE — Progress Notes (Signed)
Family Meeting Note  Advance Directive:no  Today a meeting took place with the Patient.   The following clinical team members were present during this meeting:MD RN  The following were discussed:Patient's diagnosis: Acute on chronic hypoxic respiratory failure with COPD exacerbation, mild, acute on chronic diastolic heart failure pneumonia, Patient's progosis: > 12 months and Goals for treatment: Full Code  Additional follow-up to be provided: Chaplain consultation to create advanced directives  Time spent during discussion: 16 minutes  Brenda Barreira, MD

## 2017-11-04 NOTE — ED Triage Notes (Signed)
States increased SOB x 2 weeks. Patient arrives on 02 2l per her home routine with SAT 82%. Audible wheeze. States has been taking breathing treatments without relief.

## 2017-11-04 NOTE — ED Notes (Signed)
Pt placed on 4L nasal cannula at this time. Pt O2 currently at 95%

## 2017-11-04 NOTE — Plan of Care (Signed)
  Problem: Education: Goal: Knowledge of General Education information will improve Description Including pain rating scale, medication(s)/side effects and non-pharmacologic comfort measures Outcome: Progressing   Problem: Safety: Goal: Ability to remain free from injury will improve Outcome: Progressing   Problem: Education: Goal: Ability to demonstrate management of disease process will improve Outcome: Progressing   

## 2017-11-04 NOTE — ED Notes (Signed)
Date and time results received: 11/04/17 1506 (use smartphrase ".now" to insert current time)  Test: troponin Critical Value: 0.09  Name of Provider Notified: Paduchowski  Orders Received? Or Actions Taken?: Orders Received - See Orders for details

## 2017-11-04 NOTE — Progress Notes (Signed)
Anticoagulation monitoring(Lovenox):  62 yo female ordered Lovenox 40 mg Q24h  Filed Weights   11/04/17 1246  Weight: 240 lb (108.9 kg)   BMI 41.18   Lab Results  Component Value Date   CREATININE 1.52 (H) 11/04/2017   CREATININE 0.96 02/12/2017   CREATININE 1.21 (H) 02/10/2017   Estimated Creatinine Clearance: 46.9 mL/min (A) (by C-G formula based on SCr of 1.52 mg/dL (H)). Hemoglobin & Hematocrit     Component Value Date/Time   HGB 16.7 (H) 11/04/2017 1427   HCT 52.9 (H) 11/04/2017 1427     Per Protocol for Patient with estCrcl > 30 ml/min and BMI > 40, will transition to Lovenox 40 mg Q24h.

## 2017-11-04 NOTE — H&P (Signed)
[facility] [place] [facility]   PATIENT NAME: Brenda Wu    MR#:  [id number]  DATE OF BIRTH:  October 13, 1955  DATE OF ADMISSION:  11/04/2017  PRIMARY CARE PHYSICIAN: Cheron Schaumann., MD   REQUESTING/REFERRING PHYSICIAN: dr Henrietta Hoover  CHIEF COMPLAINT:   SOB HISTORY OF PRESENT ILLNESS:  Brenda Wu  is a 62 y.o. female with a known history of chronic hypoxic respiratory failure on 2 L of oxygen due to COPD and chronic diastolic heart failure, diabetes and hypertension who presents to the emergency room due to shortness of breath.  Patient reports over the past month she has had increasing shortness of breath, lower extremity edema, increased abdominal girth and wheezing.  Over the past 2 days it has become even worse.  She denies chest pain however has PND and orthopnea.  Upon arrival in the ER patient's O2 saturation on 2 L 82%.  She denies fever or chills.  She has had a cough and wheezing. She is currently on nonrebreather  PAST MEDICAL HISTORY:   Past Medical History:  Diagnosis Date  . CHF (congestive heart failure) (HCC)   . COPD (chronic obstructive pulmonary disease) (HCC)   . Diabetes mellitus without complication (HCC)   . Hypercholesteremia   . Hypertension   . Seizures (HCC)     PAST SURGICAL HISTORY:  History reviewed. No pertinent surgical history.  SOCIAL HISTORY:   Social History   Tobacco Use  . Smoking status: Former Smoker    Packs/day: 1.00    Years: 30.00    Pack years: 30.00    Types: Cigarettes  . Smokeless tobacco: Never Used  Substance Use Topics  . Alcohol use: No    FAMILY HISTORY:   Family History  Problem Relation Age of Onset  . Hypertension Father   . Asthma Sister   . Heart murmur Sister   . Diabetes Sister     DRUG ALLERGIES:   Allergies  Allergen Reactions  . Metformin Other (See Comments)    Other reaction(s): Other (See Comments) Constipation, dry mouth, dizziness  . Bismuth  Subsalicylate Rash    Pepto Bismol    REVIEW OF SYSTEMS:   Review of Systems  Constitutional: Positive for malaise/fatigue. Negative for chills and fever.  HENT: Negative.  Negative for ear discharge, ear pain, hearing loss, nosebleeds and sore throat.   Eyes: Negative.  Negative for blurred vision and pain.  Respiratory: Positive for cough, shortness of breath and wheezing. Negative for hemoptysis.   Cardiovascular: Positive for orthopnea, leg swelling and PND. Negative for chest pain and palpitations.  Gastrointestinal: Negative.  Negative for abdominal pain, blood in stool, diarrhea, nausea and vomiting.  Genitourinary: Negative.  Negative for dysuria.  Musculoskeletal: Negative.  Negative for back pain.  Skin: Negative.   Neurological: Negative for dizziness, tremors, speech change, focal weakness, seizures and headaches.  Endo/Heme/Allergies: Negative.  Does not bruise/bleed easily.  Psychiatric/Behavioral: Negative.  Negative for depression, hallucinations and suicidal ideas.    MEDICATIONS AT HOME:   Prior to Admission medications   Medication Sig Start Date End Date Taking? Authorizing Provider  albuterol (PROVENTIL HFA;VENTOLIN HFA) 108 (90 Base) MCG/ACT inhaler Inhale 2 puffs into the lungs 4 (four) times daily as needed for wheezing or shortness of breath.  01/05/16   [provider]  albuterol (PROVENTIL) (2.5 MG/3ML) 0.083% nebulizer solution Inhale 2.5 mg into the lungs every 6 (six) hours as needed for wheezing or shortness of breath.  10/01/16   [provider]  aspirin (GOODSENSE ASPIRIN) 325 MG tablet Take 325 mg by mouth daily. 01/31/08   [provider]  atorvastatin (LIPITOR) 40 MG tablet Take 40 mg by mouth daily.    [provider]  budesonide-formoterol (SYMBICORT) 160-4.5 MCG/ACT inhaler Inhale 2 puffs into the lungs 2 (two) times daily. 05/22/15   [provider]  diltiazem (CARDIZEM SR) 60 MG 12 hr capsule Take 60 mg  by mouth daily.     [provider]  ferrous sulfate 325 (65 FE) MG tablet Take 325 mg by mouth daily with breakfast.    [provider]  furosemide (LASIX) 40 MG tablet Take 1 tablet (40 mg total) by mouth daily. 02/12/17 02/12/18  [name], MD  glipiZIDE (GLUCOTROL) 5 MG tablet Take 5 mg by mouth 2 (two) times daily before a meal.     [provider]  guaiFENesin (MUCINEX) 600 MG 12 hr tablet Take 2 tablets (1,200 mg total) by mouth 2 (two) times daily. 02/12/17   [name], MD  Insulin Glargine (LANTUS SOLOSTAR) 100 UNIT/ML Solostar Pen Inject 15 Units into the skin daily. 01/21/17   [provider]  lamoTRIgine (LAMICTAL) 200 MG tablet Take 200 mg by mouth 2 (two) times daily.     [provider]  losartan (COZAAR) 50 MG tablet Take 50 mg by mouth daily.    [provider]  Multiple Vitamin (MULTI-VITAMINS) TABS Take 1 tablet by mouth daily.    [provider]  niacin 500 MG CR capsule Take 500 mg by mouth daily. 08/15/15   [provider]  potassium chloride SA (K-DUR,KLOR-CON) 20 MEQ tablet Take 1 tablet (20 mEq total) by mouth daily. 02/12/17   [name], MD  predniSONE (DELTASONE) 10 MG tablet Take 1 tablet (10 mg total) by mouth daily. 6 tabs PO x 2 days 5 tabs PO x 2 days 4 tabs PO x 2 days 3 tabs PO x 2 days 2 tabs PO x 2 days 1 tab PO x 2 days and stop 02/12/17   [name], MD  pregabalin (LYRICA) 75 MG capsule Take 75 mg by mouth 2 (two) times daily. 01/11/17 04/11/17  [provider]  senna-docusate (SENOKOT-S) 8.6-50 MG tablet Take 1 tablet by mouth 2 (two) times daily. 02/12/17   [name], MD  sitaGLIPtin (JANUVIA) 50 MG tablet Take 50 mg by mouth daily.    [provider]  tiotropium (SPIRIVA) 18 MCG inhalation capsule Place 18 mcg into inhaler and inhale daily.    [provider]  topiramate (TOPAMAX) 200 MG tablet Take 200 mg by mouth  2 (two) times daily.    [provider]  vitamin B-12 (CYANOCOBALAMIN) 500 MCG tablet Take 500 mcg by mouth daily.    [provider]      VITAL SIGNS:  Blood pressure 138/88, pulse 83, temperature 97.9 F (36.6 C), temperature source Oral, resp. rate 20, height 5\' 4"  (1.626 m), weight 108.9 kg, SpO2 93 %.  PHYSICAL EXAMINATION:   Physical Exam  Constitutional: She is oriented to person, place, and time. No distress.  HENT:  Head: Normocephalic.  Eyes: Pupils are equal, round, and reactive to light. No scleral icterus.  Neck: Normal range of motion. Neck supple. Hepatojugular reflux present. No JVD present. No tracheal deviation present.  Cardiovascular: Normal rate, regular rhythm and normal heart sounds. Exam reveals no gallop and no friction rub.  No murmur heard. Pulmonary/Chest: Effort normal. No respiratory distress. She has wheezes  in the right upper field, the right middle field, the right lower field, the left upper field, the left middle field and the left lower field. She has no rales. She exhibits no tenderness.  Abdominal: Soft. Bowel sounds are normal. She exhibits no distension and no mass. There is no tenderness. There is no rebound and no guarding.  Musculoskeletal: Normal range of motion.       Right lower leg: She exhibits edema.       Left lower leg: She exhibits edema.  Neurological: She is alert and oriented to person, place, and time.  Skin: Skin is warm. No rash noted. No erythema.  Psychiatric: Her behavior is normal. Judgment normal. Her mood appears anxious. She is not agitated.      LABORATORY PANEL:   CBC Recent Labs  Lab 11/04/17 1427  WBC 5.4  HGB 16.7*  HCT 52.9*  PLT 81*   ------------------------------------------------------------------------------------------------------------------  Chemistries  Recent Labs  Lab 11/04/17 1427  NA 138  K 3.8  CL 100  CO2 28  GLUCOSE 103*  BUN 13  CREATININE 1.52*  CALCIUM 9.1   AST 24  ALT 20  ALKPHOS 110  BILITOT 1.1   ------------------------------------------------------------------------------------------------------------------  Cardiac Enzymes Recent Labs  Lab 11/04/17 1427  TROPONINI 0.09*   ------------------------------------------------------------------------------------------------------------------  RADIOLOGY:  Dg Chest Portable 1 View  Result Date: 11/04/2017 CLINICAL DATA:  Shortness of breath EXAM: PORTABLE CHEST 1 VIEW COMPARISON:  Chest radiograph February 07, 2017 and chest CT February 11, 2017 FINDINGS: There is atelectatic change in the left mid lung. There is hazy opacity in the right mid and lower lung zones with a right pleural effusion noted. There is cardiomegaly with pulmonary vascularity normal. No adenopathy. No bone lesions. IMPRESSION: Cardiomegaly with pulmonary vascularity unremarkable. Atelectasis left mid lung. Right pleural effusion. Hazy opacity in portions of the right mid lower lung zones may be due to a degree of layering effusion; underlying airspace opacity/infiltrate cannot be excluded. When patient is clinically able, upright PA and lateral chest radiographs to further assess advised. Electronically Signed   By: Bretta Bang III M.D.   On: 11/04/2017 13:22    EKG:   nsr no st elevation or depression QTC prolonged 619 ms  IMPRESSION AND PLAN:   62 y/o female with chronic hypoxic respiratoray failure with COPD and chronic diastolic heart failure who presents to ED with worsening SOB over the past month.  1.  Acute on chronic hypoxic respiratory failure with PNA and acute on chronic diastol;ic heart failure and mild COPD exacerbation:  Wean to baseline O2 as tolerated of 2 L  2. CAP: Continue Rocephin and Azithromycin as started in ED Follow up on blood cx.  3. AECOPD, mild: Low dose steroids (patient steroid dependent) Continue Nebs and Inhalers Wheezing is most likely due to acute on chronic diastolic  heart failure  4. Acute on chronic Diastolic heart failure:  Lasix 40 IV twice daily Order echocardiogram last one was done over a year ago Monitor intake and output with daily weight CHF clinic upon discharge   5. Mildly elevated troponin from above issues: Continue tele and cycle enzymes Cardiology consultation with [facility] if needed  6. Diabetes: Continue lantus, SSI and ADa diet DM nurse consult Hold po meds for now  7. Ess HTN: Continue Cardizem, Losartan  8. HLD: Continue statin      All the records are reviewed and case discussed with ED provider. Management plans discussed with the patient and she is  in agreement  CODE STATUS: FULL  TOTAL TIME TAKING CARE OF THIS PATIENT: 50 minutes.    Brenda Wu M.D on 11/04/2017 at 3:14 PM  Between 7am to 6pm - Pager - [phone]  After 6pm go to [email or web address] - password [email or web address]  [facility]  Office  [phone]  CC: Primary care physician; Cheron Schaumann., MD

## 2017-11-05 ENCOUNTER — Inpatient Hospital Stay (HOSPITAL_COMMUNITY)
Admit: 2017-11-05 | Discharge: 2017-11-05 | Disposition: A | Discharge status: Home / Self Care | Payer: Medicare HMO | Attending: Internal Medicine | Admitting: Internal Medicine

## 2017-11-05 DIAGNOSIS — I340 Nonrheumatic mitral (valve) insufficiency: Secondary | ICD-10-CM

## 2017-11-05 DIAGNOSIS — J9601 Acute respiratory failure with hypoxia: Secondary | ICD-10-CM

## 2017-11-05 LAB — CBC
HEMATOCRIT: 52.1 % — AB (ref 35.0–47.0)
HEMOGLOBIN: 16.4 g/dL — AB (ref 12.0–16.0)
MCH: 30.9 pg (ref 26.0–34.0)
MCHC: 31.4 g/dL — ABNORMAL LOW (ref 32.0–36.0)
MCV: 98.2 fL (ref 80.0–100.0)
Platelets: 98 10*3/uL — ABNORMAL LOW (ref 150–440)
RBC: 5.3 MIL/uL — ABNORMAL HIGH (ref 3.80–5.20)
RDW: 16.6 % — AB (ref 11.5–14.5)
WBC: 4.8 10*3/uL (ref 3.6–11.0)

## 2017-11-05 LAB — BLOOD GAS, ARTERIAL
ALLENS TEST (PASS/FAIL): POSITIVE — AB
Acid-Base Excess: 2.5 mmol/L — ABNORMAL HIGH (ref 0.0–2.0)
Bicarbonate: 33.6 mmol/L — ABNORMAL HIGH (ref 20.0–28.0)
FIO2: 0.32
O2 Saturation: 82 %
PO2 ART: 56 mmHg — AB (ref 83.0–108.0)
Patient temperature: 37
pCO2 arterial: 82 mmHg (ref 32.0–48.0)
pH, Arterial: 7.22 — ABNORMAL LOW (ref 7.350–7.450)

## 2017-11-05 LAB — BASIC METABOLIC PANEL
ANION GAP: 13 (ref 5–15)
BUN: 17 mg/dL (ref 8–23)
CHLORIDE: 98 mmol/L (ref 98–111)
CO2: 30 mmol/L (ref 22–32)
Calcium: 8.7 mg/dL — ABNORMAL LOW (ref 8.9–10.3)
Creatinine, Ser: 1.72 mg/dL — ABNORMAL HIGH (ref 0.44–1.00)
GFR calc Af Amer: 36 mL/min — ABNORMAL LOW (ref >60)
GFR, EST NON AFRICAN AMERICAN: 31 mL/min — AB (ref >60)
GLUCOSE: 172 mg/dL — AB (ref 70–99)
POTASSIUM: 4.1 mmol/L (ref 3.5–5.1)
Sodium: 141 mmol/L (ref 135–145)

## 2017-11-05 LAB — TROPONIN I
Troponin I: 0.08 ng/mL (ref <0.03)
Troponin I: 0.08 ng/mL (ref <0.03)

## 2017-11-05 LAB — GLUCOSE, CAPILLARY
GLUCOSE-CAPILLARY: 116 mg/dL — AB (ref 70–99)
Glucose-Capillary: 178 mg/dL — ABNORMAL HIGH (ref 70–99)
Glucose-Capillary: 65 mg/dL — ABNORMAL LOW (ref 70–99)
Glucose-Capillary: 68 mg/dL — ABNORMAL LOW (ref 70–99)
Glucose-Capillary: 161 mg/dL — ABNORMAL HIGH (ref 70–99)

## 2017-11-05 LAB — ECHOCARDIOGRAM COMPLETE
Height: 64 in
Weight: 4416.25 oz

## 2017-11-05 LAB — HIV ANTIBODY (ROUTINE TESTING W REFLEX): HIV Screen 4th Generation wRfx: NONREACTIVE

## 2017-11-05 LAB — MRSA PCR SCREENING: MRSA by PCR: NEGATIVE

## 2017-11-05 MED ORDER — SODIUM CHLORIDE 0.9 % IV SOLN
INTRAVENOUS | Status: DC | PRN
  Administered 2017-11-05: 250 mL via INTRAVENOUS

## 2017-11-05 NOTE — Progress Notes (Signed)
Patient's blood glucose was checked and found to be 68.  Patient was given 8 oz of grape juice. Was rechecked 15 minutes later and was 65.  Patient was given graham crackers and peanut butter spread.  Patient showed no signs/symptoms of hypoglycemia.  She is currently eating dinner.  Also before ICU admission, patient received Lantus and was unable to eat breakfast or lunch due to lethargy/Bipap.  Will continue to monitor.

## 2017-11-05 NOTE — Progress Notes (Signed)
PT Cancellation Note  Patient Details Name: Brenda Wu MRN: [id number] DOB: [birth year]   Cancelled Treatment:    Reason Eval/Treat Not Completed: Patient not medically ready.  Per RN note, patient has been moved from [facility] to [facility].  Will complete order at this time.  Please re-consult for PT when appropriate.   Glenetta Hew, PT, DPT 11/05/2017, 10:21 AM

## 2017-11-05 NOTE — Consult Note (Signed)
Reason for Consult:Respiratory Failure   Referring Physician: Dr. Winfield Cunas [place] is an 62 y.o. female.  HPI: Brenda Wu is a very pleasant 62 year old African-American female with a past medical history remarkable for hypertension, hypercholesterolemia, diabetes, seizure disorder, congestive heart failure and COPD, presents with progressive increasing shortness of breath, lower extremity edema, increased abdominal girth and wheezing.She was admitted yesterday and treated for pneumonia along with diastolic heart failure. She normally wears 2 L of oxygen at home. She was started on azithromycin, Rocephin, albuterol, Atrovent and Solu-Medrol. This morning her respiratory status deteriorated with acute on chronic hypercapnic respiratory failure. She was moved to the intensive care unit for BiPAP  Past Medical History:  Diagnosis Date  . CHF (congestive heart failure) ([place])   . COPD (chronic obstructive pulmonary disease) ([place])   . Diabetes mellitus without complication ([place])   . Hypercholesteremia   . Hypertension   . Seizures ([place])     Past Surgical History:  Procedure Laterality Date  . REPLACEMENT TOTAL KNEE      Family History  Problem Relation Age of Onset  . Hypertension Father   . Asthma Sister   . Heart murmur Sister   . Diabetes Sister     Social History:  reports that she has quit smoking. Her smoking use included cigarettes. She has a 30.00 pack-year smoking history. She has never used smokeless tobacco. She reports that she does not drink alcohol or use drugs.  Allergies:  Allergies  Allergen Reactions  . Metformin Other (See Comments)    Other reaction(s): Other (See Comments) Constipation, dry mouth, dizziness  . Bismuth Subsalicylate Rash    Pepto Bismol    Medications: I have reviewed the patient's current medications.  Results for orders placed or performed during the hospital encounter of 11/04/17 (from the past 48 hour(s))  CBC     Status:  Abnormal   Collection Time: 11/04/17  2:27 PM  Result Value Ref Range   WBC 5.4 3.6 - 11.0 K/uL   RBC 5.41 (H) 3.80 - 5.20 MIL/uL   Hemoglobin 16.7 (H) 12.0 - 16.0 g/dL   HCT 52.9 (H) 35.0 - 47.0 %   MCV 97.7 80.0 - 100.0 fL   MCH 30.8 26.0 - 34.0 pg   MCHC 31.5 (L) 32.0 - 36.0 g/dL   RDW 16.6 (H) 11.5 - 14.5 %   Platelets 81 (L) 150 - 440 K/uL    Comment: Performed at [facility], [address] [place], [place] [address]  Troponin I     Status: Abnormal   Collection Time: 11/04/17  2:27 PM  Result Value Ref Range   Troponin I 0.09 (HH) <0.03 ng/mL    Comment: CRITICAL RESULT CALLED TO, READ BACK BY AND VERIFIED WITH SAMANTHA HAMILTON 11/04/17 1505 JML Performed at [facility], [address] [place], [place] [address]   Comprehensive metabolic panel     Status: Abnormal   Collection Time: 11/04/17  2:27 PM  Result Value Ref Range   Sodium 138 135 - 145 mmol/L   Potassium 3.8 3.5 - 5.1 mmol/L   Chloride 100 98 - 111 mmol/L   CO2 28 22 - 32 mmol/L   Glucose, Bld 103 (H) 70 - 99 mg/dL   BUN 13 8 - 23 mg/dL   Creatinine, Ser 1.52 (H) 0.44 - 1.00 mg/dL   Calcium 9.1 8.9 - 10.3 mg/dL   Total Protein 6.7 6.5 - 8.1 g/dL   Albumin 3.9 3.5 - 5.0 g/dL  AST 24 15 - 41 U/L   ALT 20 0 - 44 U/L   Alkaline Phosphatase 110 38 - 126 U/L   Total Bilirubin 1.1 0.3 - 1.2 mg/dL   GFR calc non Af Amer 36 (L) >60 mL/min   GFR calc Af Amer 42 (L) >60 mL/min    Comment: (NOTE) The eGFR has been calculated using the CKD EPI equation. This calculation has not been validated in all clinical situations. eGFR's persistently <60 mL/min signify possible Chronic Kidney Disease.    Anion gap 10 5 - 15    Comment: Performed at [facility], [address] [place], [place] [address]  Hemoglobin A1c     Status: Abnormal   Collection Time: 11/04/17  2:27 PM  Result Value Ref Range   Hgb A1c MFr Bld 7.8 (H) 4.8 - 5.6 %    Comment: (NOTE) Pre diabetes:           5.7%-6.4% Diabetes:              >6.4% Glycemic control for   <7.0% adults with diabetes    Mean Plasma Glucose 177.16 mg/dL    Comment: Performed at [facility], [place] [address] [place], [place] [address]  Glucose, capillary     Status: None   Collection Time: 11/04/17  4:49 PM  Result Value Ref Range   Glucose-Capillary 89 70 - 99 mg/dL  Troponin I     Status: Abnormal   Collection Time: 11/04/17  4:56 PM  Result Value Ref Range   Troponin I 0.10 (HH) <0.03 ng/mL    Comment: CRITICAL VALUE NOTED. VALUE IS CONSISTENT WITH PREVIOUSLY REPORTED/CALLED VALUE. JML Performed at [facility], [address] [place], [place] [address]   HIV antibody (Routine Testing)     Status: None   Collection Time: 11/04/17  4:56 PM  Result Value Ref Range   HIV Screen 4th Generation wRfx Non Reactive Non Reactive    Comment: (NOTE) Performed At: [facility] [place], [place] [id number] Rush Farmer MD WE:[id number]   CBC     Status: Abnormal   Collection Time: 11/04/17  4:56 PM  Result Value Ref Range   WBC 5.2 3.6 - 11.0 K/uL   RBC 5.42 (H) 3.80 - 5.20 MIL/uL   Hemoglobin 16.7 (H) 12.0 - 16.0 g/dL    Comment: RESULT REPEATED AND VERIFIED   HCT 53.4 (H) 35.0 - 47.0 %   MCV 98.4 80.0 - 100.0 fL   MCH 30.8 26.0 - 34.0 pg   MCHC 31.3 (L) 32.0 - 36.0 g/dL   RDW 16.8 (H) 11.5 - 14.5 %   Platelets 92 (L) 150 - 440 K/uL    Comment: Performed at [facility], [address] [place], [place] [address]  Creatinine, serum     Status: Abnormal   Collection Time: 11/04/17  4:56 PM  Result Value Ref Range   Creatinine, Ser 1.62 (H) 0.44 - 1.00 mg/dL   GFR calc non Af Amer 33 (L) >60 mL/min   GFR calc Af Amer 39 (L) >60 mL/min    Comment: (NOTE) The eGFR has been calculated using the CKD EPI equation. This calculation has not been validated in all clinical situations. eGFR's persistently <60 mL/min signify possible Chronic  Kidney Disease. Performed at [facility], [address], [place],  [address]   Brain natriuretic peptide     Status: Abnormal   Collection Time: 11/04/17  4:56 PM  Result Value Ref Range   B Natriuretic Peptide 909.0 (H) 0.0 - 100.0 pg/mL    Comment: Performed at [facility], [address] [place], [place] [address]  Glucose, capillary     Status: Abnormal   Collection Time: 11/04/17  8:11 PM  Result Value Ref Range   Glucose-Capillary 166 (H) 70 - 99 mg/dL  Troponin I     Status: Abnormal   Collection Time: 11/04/17  9:14 PM  Result Value Ref Range   Troponin I 0.09 (HH) <0.03 ng/mL    Comment: CRITICAL VALUE NOTED. VALUE IS CONSISTENT WITH PREVIOUSLY REPORTED/CALLED VALUE AKT Performed at [facility], [address] [place], [place] [address]   Troponin I     Status: Abnormal   Collection Time: 11/05/17  6:06 AM  Result Value Ref Range   Troponin I 0.08 (HH) <0.03 ng/mL    Comment: CRITICAL VALUE NOTED. VALUE IS CONSISTENT WITH PREVIOUSLY REPORTED/CALLED VALUE / [place] Performed at [facility], [address] [place], [place] [address]   Basic metabolic panel     Status: Abnormal   Collection Time: 11/05/17  6:06 AM  Result Value Ref Range   Sodium 141 135 - 145 mmol/L   Potassium 4.1 3.5 - 5.1 mmol/L   Chloride 98 98 - 111 mmol/L   CO2 30 22 - 32 mmol/L   Glucose, Bld 172 (H) 70 - 99 mg/dL   BUN 17 8 - 23 mg/dL   Creatinine, Ser 1.72 (H) 0.44 - 1.00 mg/dL   Calcium 8.7 (L) 8.9 - 10.3 mg/dL   GFR calc non Af Amer 31 (L) >60 mL/min   GFR calc Af Amer 36 (L) >60 mL/min    Comment: (NOTE) The eGFR has been calculated using the CKD EPI equation. This calculation has not been validated in all clinical situations. eGFR's persistently <60 mL/min signify possible Chronic Kidney Disease.    Anion gap 13 5 - 15    Comment: Performed at [facility], [address] [place], [place] [address]  CBC     Status:  Abnormal   Collection Time: 11/05/17  6:06 AM  Result Value Ref Range   WBC 4.8 3.6 - 11.0 K/uL   RBC 5.30 (H) 3.80 - 5.20 MIL/uL   Hemoglobin 16.4 (H) 12.0 - 16.0 g/dL   HCT 52.1 (H) 35.0 - 47.0 %   MCV 98.2 80.0 - 100.0 fL   MCH 30.9 26.0 - 34.0 pg   MCHC 31.4 (L) 32.0 - 36.0 g/dL   RDW 16.6 (H) 11.5 - 14.5 %   Platelets 98 (L) 150 - 440 K/uL    Comment: Performed at [facility], [address] [place], [place] [address]  Glucose, capillary     Status: Abnormal   Collection Time: 11/05/17  8:16 AM  Result Value Ref Range   Glucose-Capillary 178 (H) 70 - 99 mg/dL  Blood gas, arterial     Status: Abnormal   Collection Time: 11/05/17  9:02 AM  Result Value Ref Range   FIO2 0.32    Delivery systems NASAL CANNULA    pH, Arterial 7.22 (L) 7.350 - 7.450   pCO2 arterial 82 (HH) 32.0 - 48.0 mmHg    Comment: CRITICAL RESULT CALLED TO, READ BACK BY AND VERIFIED WITH: CRITICAL VALUE 11/05/17,0920 DR. SALARY/FD    pO2, Arterial 56 (L) 83.0 - 108.0 mmHg   Bicarbonate 33.6 (H) 20.0 - 28.0 mmol/L   Acid-Base Excess 2.5 (H) 0.0 - 2.0  mmol/L   O2 Saturation 82.0 %   Patient temperature 37.0    Collection site RIGHT RADIAL    Sample type ARTERIAL DRAW    Allens test (pass/fail) POSITIVE (A) PASS    Comment: Performed at [facility], [address] [place], [place] [address]    Dg Chest Portable 1 View  Result Date: 11/04/2017 CLINICAL DATA:  Shortness of breath EXAM: PORTABLE CHEST 1 VIEW COMPARISON:  Chest radiograph February 07, 2017 and chest CT February 11, 2017 FINDINGS: There is atelectatic change in the left mid lung. There is hazy opacity in the right mid and lower lung zones with a right pleural effusion noted. There is cardiomegaly with pulmonary vascularity normal. No adenopathy. No bone lesions. IMPRESSION: Cardiomegaly with pulmonary vascularity unremarkable. Atelectasis left mid lung. Right pleural effusion. Hazy opacity in portions of the right mid lower  lung zones may be due to a degree of layering effusion; underlying airspace opacity/infiltrate cannot be excluded. When patient is clinically able, upright PA and lateral chest radiographs to further assess advised. Electronically Signed   By: Lowella Grip III M.D.   On: 11/04/2017 13:22    ROS  Patient has been complaining of increasing shortness of breath and wheezing. She denies any fever, chills or sweats. No hemoptysis, no weight loss, please see history of present illness for remaining review of systems also negative  Blood pressure (!) 141/90, pulse 86, temperature 98.8 F (37.1 C), temperature source Oral, resp. rate (!) 27, height '5\' 4"'$  (1.626 m), weight 125.2 kg, SpO2 92 %. Physical Exam  Patient is awake, alert, oriented in mild respiratory distress Vital signs: Please see the above listed vital signs HEENT: Mild accessory muscle utilization, trachea midline, no thyromegaly appreciated, no oral lesions noted Cardiovascular: Regular rate and rhythm Pulmonary: Diminished air movement, prolonged expiratory phase with diffuse wheezing Abdominal: Positive bowel sounds, soft exam Extremities: Edema is appreciated, no clubbing or cyanosis Neurologic: No focal deficits appreciated Psychiatric: Patient is appropriate and oriented  Assessment/Plan:  Respiratory failure. Multifactorial to include acute exacerbation of COPD with significant bronchospasm along with diastolic heart failure. Chest x-ray shows diffuse vascular congestion with cardiomegaly. Prior echo showed preserved ejection fraction with grade 1 diastolic dysfunction. Pending repeat echocardiogram. Patient is on azithromycin, Rocephin, albuterol, Atrovent, Solu-Medrol, is being started on BiPAP for acute on chronic hypercapnic respiratory failure  Elevated troponin. EKG reveals rightward axis, left atrial enlargement with poor R-wave progression and nonspecific ST-T wave changes. Troponin is only minimally elevated at  0.08  Diabetes. We'll continue Lantus and sliding scale  Hypertension  Hyperlipidemia    Kaci Freel 11/05/2017, 10:29 AM

## 2017-11-05 NOTE — Progress Notes (Signed)
Report given to Maralyn Sago, RN. Patient transferred to CCU Room 13 with RN and NT on monitor and 3LO2. Receiving RN aware of arrival and at bedside.

## 2017-11-05 NOTE — Progress Notes (Signed)
On AM assessment patient found to be lethargic and extremely difficult to arouse. She is oriented x4 and able to follow some commands. O2 sats 86% on 3L. MD Salary made aware and at bedside.   Stat ABG ordered and collected:  Results for Brenda Wu, Brenda Wu (MRN [id number]) as of 11/05/2017 09:21  Ref. Range 11/05/2017 09:02  pH, Arterial Latest Ref Range: 7.350 - 7.450  7.22 (L)  pCO2 arterial Latest Ref Range: 32.0 - 48.0 mmHg 82 (HH)  pO2, Arterial Latest Ref Range: 83.0 - 108.0 mmHg 56 (L)  Acid-Base Excess Latest Ref Range: 0.0 - 2.0 mmol/L 2.5 (H)  Bicarbonate Latest Ref Range: 20.0 - 28.0 mmol/L 33.6 (H)  O2 Saturation Latest Units: % 82.0  Patient temperature Unknown 37.0   MD Salary made aware and order placed to transfer to CCU. 40mg  IV lasix also ordered to be given now.

## 2017-11-05 NOTE — Progress Notes (Signed)
*  PRELIMINARY RESULTS* Echocardiogram 2D Echocardiogram has been performed.  Brenda Wu 11/05/2017, 3:01 PM

## 2017-11-05 NOTE — Progress Notes (Signed)
[facility] [facility] [place] [facility]   PATIENT NAME: Brenda Wu    MR#:  [id number]  DATE OF BIRTH:  [birth year]  SUBJECTIVE:  CHIEF COMPLAINT:   Chief Complaint  Patient presents with  . Shortness of Breath  Patient is very lethargic this morning, blood gas noted for acute hypoxic hypercapnic respiratory failure, unable to do CPAP on the floor, discussed with intensivist-patient transferred to ICU for BiPAP REVIEW OF SYSTEMS:  CONSTITUTIONAL: No fever, fatigue or weakness.  EYES: No blurred or double vision.  EARS, NOSE, AND THROAT: No tinnitus or ear pain.  RESPIRATORY: No cough, shortness of breath, wheezing or hemoptysis.  CARDIOVASCULAR: No chest pain, orthopnea, edema.  GASTROINTESTINAL: No nausea, vomiting, diarrhea or abdominal pain.  GENITOURINARY: No dysuria, hematuria.  ENDOCRINE: No polyuria, nocturia,  HEMATOLOGY: No anemia, easy bruising or bleeding SKIN: No rash or lesion. MUSCULOSKELETAL: No joint pain or arthritis.   NEUROLOGIC: No tingling, numbness, weakness.  PSYCHIATRY: No anxiety or depression.   ROS  DRUG ALLERGIES:   Allergies  Allergen Reactions  . Metformin Other (See Comments)    Other reaction(s): Other (See Comments) Constipation, dry mouth, dizziness  . Bismuth Subsalicylate Rash    Pepto Bismol    VITALS:  Blood pressure 137/88, pulse 78, temperature 98.9 F (37.2 C), resp. rate 18, height 5\' 4"  (1.626 m), weight 125.3 kg, SpO2 93 %.  PHYSICAL EXAMINATION:  GENERAL:  62 y.o.-year-old patient lying in the bed with no acute distress.  EYES: Pupils equal, round, reactive to light and accommodation. No scleral icterus. Extraocular muscles intact.  HEENT: Head atraumatic, normocephalic. Oropharynx and nasopharynx clear.  NECK:  Supple, no jugular venous distention. No thyroid enlargement, no tenderness.  LUNGS: Normal breath sounds bilaterally, no wheezing, rales,rhonchi or crepitation. No use of accessory muscles of  respiration.  CARDIOVASCULAR: S1, S2 normal. No murmurs, rubs, or gallops.  ABDOMEN: Soft, nontender, nondistended. Bowel sounds present. No organomegaly or mass.  EXTREMITIES: No pedal edema, cyanosis, or clubbing.  NEUROLOGIC: Cranial nerves II through XII are intact. Muscle strength 5/5 in all extremities. Sensation intact. Gait not checked.  PSYCHIATRIC: The patient is alert and oriented x 3.  SKIN: No obvious rash, lesion, or ulcer.   Physical Exam LABORATORY PANEL:   CBC Recent Labs  Lab 11/05/17 0606  WBC 4.8  HGB 16.4*  HCT 52.1*  PLT 98*   ------------------------------------------------------------------------------------------------------------------  Chemistries  Recent Labs  Lab 11/04/17 1427  11/05/17 0606  NA 138  --  141  K 3.8  --  4.1  CL 100  --  98  CO2 28  --  30  GLUCOSE 103*  --  172*  BUN 13  --  17  CREATININE 1.52*   < > 1.72*  CALCIUM 9.1  --  8.7*  AST 24  --   --   ALT 20  --   --   ALKPHOS 110  --   --   BILITOT 1.1  --   --    < > = values in this interval not displayed.   ------------------------------------------------------------------------------------------------------------------  Cardiac Enzymes Recent Labs  Lab 11/04/17 2114 11/05/17 0606  TROPONINI 0.09* 0.08*   ------------------------------------------------------------------------------------------------------------------  RADIOLOGY:  Dg Chest Portable 1 View  Result Date: 11/04/2017 CLINICAL DATA:  Shortness of breath EXAM: PORTABLE CHEST 1 VIEW COMPARISON:  Chest radiograph February 07, 2017 and chest CT February 11, 2017 FINDINGS: There is atelectatic change in the left mid lung. There is hazy opacity in the  right mid and lower lung zones with a right pleural effusion noted. There is cardiomegaly with pulmonary vascularity normal. No adenopathy. No bone lesions. IMPRESSION: Cardiomegaly with pulmonary vascularity unremarkable. Atelectasis left mid lung. Right pleural  effusion. Hazy opacity in portions of the right mid lower lung zones may be due to a degree of layering effusion; underlying airspace opacity/infiltrate cannot be excluded. When patient is clinically able, upright PA and lateral chest radiographs to further assess advised. Electronically Signed   By: Bretta Bang III M.D.   On: 11/04/2017 13:22    ASSESSMENT AND PLAN:   62 y/o female with chronic hypoxic respiratoray failure with COPD and chronic diastolic heart failure who presents to ED with worsening SOB over the past month.  *Acute on chronic hypoxic hypercapnic respiratory failure Due to CAP, acute on chronic diastol;ic heart failure, obstructive sleep apnea, and mild COPD exacerbation Worsening noted from inability to use CPAP overnight  Discussed with intensivist-we will transfer to ICU for BiPAP  On chronic 2 L via nasal cannula continuous at home  *CAP, acute Continue pneumonia protocol, empiric Rocephin/azithromycin, follow-up on cultures  *Acute on COPD exacerbation, mild Taper IV Solu-Medrol as tolerated, inhaled corticosteroids, aggressive pulmonary toilet with bronchodilator therapy, supplemental oxygen wean as tolerated  *Acute on chronic Diastolic heart failure:  Continue congestive heart failure protocol, IV Lasix twice daily, follow-up on echocardiogram, strict I&O monitoring, daily weights, and refer to congestive heart failure clinic upon discharge   *Acute mildly elevated troponin Inconsistent with acute coronary syndrome Most likely secondary to demand ischemia from respiratory failure Conservative management  *Chronic diabetes mellitus type 2 Sliding scale insulin with Accu-Cheks per routine  *Chronic benign essential hypertension Stable on Cardizem, Losartan  *Chronic hyperlipidemia, unspecified  Continue statin   All the records are reviewed and case discussed with Care Management/Social Workerr. Management plans discussed with the patient, family  and they are in agreement.  CODE STATUS: full  TOTAL TIME TAKING CARE OF THIS PATIENT: 40 minutes.     POSSIBLE D/C IN 2-3 DAYS, DEPENDING ON CLINICAL CONDITION.   Brenda Wu M.D on 11/05/2017   Between 7am to 6pm - Pager - [phone]  After 6pm go to [email or web address] - password [email or web address]  [facility]  Office  [phone]  CC: Primary care physician; Cheron Schaumann., MD  Note: This dictation was prepared with Dragon dictation along with smaller phrase technology. Any transcriptional errors that result from this process are unintentional.

## 2017-11-05 NOTE — Progress Notes (Signed)
Inpatient Diabetes Program Recommendations  AACE/ADA: New Consensus Statement on Inpatient Glycemic Control (2015)  Target Ranges:  Prepandial:   less than 140 mg/dL      Peak postprandial:   less than 180 mg/dL (1-2 hours)      Critically ill patients:  140 - 180 mg/dL   Lab Results  Component Value Date   GLUCAP 178 (H) 11/05/2017   HGBA1C 7.8 (H) 11/04/2017    Review of Glycemic Control Results for KATONIA, JUDAY (MRN [id number]) as of 11/05/2017 09:54  Ref. Range 11/04/2017 16:49 11/04/2017 20:11 11/05/2017 08:16  Glucose-Capillary Latest Ref Range: 70 - 99 mg/dL 89 549 (H) 826 (H)   Diabetes history: Type 2 DM Outpatient Diabetes medications: Glipizide 5 mg BID, Lantus 15 units QD, Januvia 50 mg QD Current orders for Inpatient glycemic control: Novolog 0-5 units QHS, Novolog 0-20 units TID, Lantus 15 units QD, Tradjenta 5 mg QD Solumedrol 60 mg Q24H  Inpatient Diabetes Program Recommendations:    Noted consult and A1C result from 11/04/17, patient in process of transfer to CCU.  In lieu of steroids, BS trending well given inpatient orders. Will continue to follow.   Thanks, Lujean Rave, MSN, RNC-OB Diabetes Coordinator [phone] (8a-5p)

## 2017-11-06 LAB — BLOOD GAS, ARTERIAL
Acid-Base Excess: 8.1 mmol/L — ABNORMAL HIGH (ref 0.0–2.0)
Bicarbonate: 37.4 mmol/L — ABNORMAL HIGH (ref 20.0–28.0)
Delivery systems: POSITIVE
Expiratory PAP: 6
FIO2: 0.4
Inspiratory PAP: 14
O2 Saturation: 87 %
Patient temperature: 37
pCO2 arterial: 71 mmHg (ref 32.0–48.0)
pH, Arterial: 7.33 — ABNORMAL LOW (ref 7.350–7.450)
pO2, Arterial: 57 mmHg — ABNORMAL LOW (ref 83.0–108.0)

## 2017-11-06 LAB — GLUCOSE, CAPILLARY
GLUCOSE-CAPILLARY: 189 mg/dL — AB (ref 70–99)
Glucose-Capillary: 79 mg/dL (ref 70–99)
Glucose-Capillary: 97 mg/dL (ref 70–99)
Glucose-Capillary: 162 mg/dL — ABNORMAL HIGH (ref 70–99)

## 2017-11-06 LAB — CBC
HCT: 49.8 % — ABNORMAL HIGH (ref 35.0–47.0)
HEMOGLOBIN: 15.7 g/dL (ref 12.0–16.0)
MCH: 30.8 pg (ref 26.0–34.0)
MCHC: 31.6 g/dL — ABNORMAL LOW (ref 32.0–36.0)
MCV: 97.5 fL (ref 80.0–100.0)
Platelets: 96 10*3/uL — ABNORMAL LOW (ref 150–440)
RBC: 5.1 MIL/uL (ref 3.80–5.20)
RDW: 16.4 % — ABNORMAL HIGH (ref 11.5–14.5)
WBC: 4.3 10*3/uL (ref 3.6–11.0)

## 2017-11-06 LAB — BASIC METABOLIC PANEL
Anion gap: 11 (ref 5–15)
BUN: 18 mg/dL (ref 8–23)
CHLORIDE: 98 mmol/L (ref 98–111)
CO2: 34 mmol/L — ABNORMAL HIGH (ref 22–32)
Calcium: 8.7 mg/dL — ABNORMAL LOW (ref 8.9–10.3)
Creatinine, Ser: 1.52 mg/dL — ABNORMAL HIGH (ref 0.44–1.00)
GFR calc Af Amer: 42 mL/min — ABNORMAL LOW (ref >60)
GFR calc non Af Amer: 36 mL/min — ABNORMAL LOW (ref >60)
Glucose, Bld: 95 mg/dL (ref 70–99)
Potassium: 3.9 mmol/L (ref 3.5–5.1)
SODIUM: 143 mmol/L (ref 135–145)

## 2017-11-06 LAB — MAGNESIUM: MAGNESIUM: 2.3 mg/dL (ref 1.7–2.4)

## 2017-11-06 LAB — PHOSPHORUS: Phosphorus: 4.1 mg/dL (ref 2.5–4.6)

## 2017-11-06 NOTE — Progress Notes (Signed)
Follow up - Critical Care Medicine Note  Patient Details:    Brenda Wu is an 62 y.o. female.with a past medical history remarkable for hypertension, hypercholesterolemia, diabetes, seizure disorder, congestive heart failure and COPD, presents with progressive increasing shortness of breath, lower extremity edema, increased abdominal girth and wheezing.She was admitted and treated for pneumonia along with diastolic heart failure. She normally wears 2 L of oxygen at home. She was started on azithromycin, Rocephin, albuterol, Atrovent and Solu-Medrol. This morning her respiratory status deteriorated with acute on chronic hypercapnic respiratory failure. She was moved to the intensive care unit for BiPAP  Lines, Airways, Drains: External Urinary Catheter (Active)  Collection Container Dedicated Suction Canister 11/06/2017  1:40 AM  Securement Method Other (Comment) 11/06/2017  1:40 AM  Intervention Equipment Changed 11/05/2017  8:00 PM  Output (mL) 500 mL 11/06/2017  8:00 AM    Anti-infectives:  Anti-infectives (From admission, onward)   Start     Dose/Rate Route Frequency Ordered Stop   11/05/17 1500  cefTRIAXone (ROCEPHIN) 1 g in sodium chloride 0.9 % 100 mL IVPB     1 g 200 mL/hr over 30 Minutes Intravenous Every 24 hours 11/04/17 1538     11/05/17 1500  azithromycin (ZITHROMAX) 500 mg in sodium chloride 0.9 % 250 mL IVPB     500 mg 250 mL/hr over 60 Minutes Intravenous Every 24 hours 11/04/17 1538     11/04/17 1415  cefTRIAXone (ROCEPHIN) 1 g in sodium chloride 0.9 % 100 mL IVPB     1 g 200 mL/hr over 30 Minutes Intravenous  Once 11/04/17 1404 11/04/17 1530   11/04/17 1415  azithromycin (ZITHROMAX) 500 mg in sodium chloride 0.9 % 250 mL IVPB     500 mg 250 mL/hr over 60 Minutes Intravenous  Once 11/04/17 1404 11/04/17 1725      Microbiology: Results for orders placed or performed during the hospital encounter of 11/04/17  MRSA PCR Screening     Status: None   Collection Time:  11/05/17 10:16 AM  Result Value Ref Range Status   MRSA by PCR NEGATIVE NEGATIVE Final    Comment:        The GeneXpert MRSA Assay (FDA approved for NASAL specimens only), is one component of a comprehensive MRSA colonization surveillance program. It is not intended to diagnose MRSA infection nor to guide or monitor treatment for MRSA infections. Performed at [facility], [address] [place] [place], [place] [address]    Studies: Dg Chest Portable 1 View  Result Date: 11/04/2017 CLINICAL DATA:  Shortness of breath EXAM: PORTABLE CHEST 1 VIEW COMPARISON:  Chest radiograph February 07, 2017 and chest CT February 11, 2017 FINDINGS: There is atelectatic change in the left mid lung. There is hazy opacity in the right mid and lower lung zones with a right pleural effusion noted. There is cardiomegaly with pulmonary vascularity normal. No adenopathy. No bone lesions. IMPRESSION: Cardiomegaly with pulmonary vascularity unremarkable. Atelectasis left mid lung. Right pleural effusion. Hazy opacity in portions of the right mid lower lung zones may be due to a degree of layering effusion; underlying airspace opacity/infiltrate cannot be excluded. When patient is clinically able, upright PA and lateral chest radiographs to further assess advised. Electronically Signed   By: Bretta Bang III M.D.   On: 11/04/2017 13:22    Consults:    Subjective:    Overnight Issues: atient has remained on BiPAP throughout the day and presently on BiPAP this morning  Objective:  Vital signs for  last 24 hours: Temp:  [97.8 F (36.6 C)-98.8 F (37.1 C)] 98.5 F (36.9 C) (09/15 0800) Pulse Rate:  [72-92] 72 (09/15 0806) Resp:  [12-27] 21 (09/15 0806) BP: (100-165)/(65-127) 126/83 (09/15 0800) SpO2:  [88 %-98 %] 98 % (09/15 0806) FiO2 (%):  [30 %-35 %] 35 % (09/15 0806) Weight:  [123.6 kg-125.2 kg] 123.6 kg (09/15 0500)  Hemodynamic parameters for last 24 hours:    Intake/Output from previous  day: 09/14 0701 - 09/15 0700 In: 822.4 [P.O.:240; I.V.:57.4; IV Piggyback:525] Out: 2450 [Urine:2450]  Intake/Output this shift: Total I/O In: -  Out: 500 [Urine:500]  Vent settings for last 24 hours: FiO2 (%):  [30 %-35 %] 35 %  Physical Exam:   Patient is awake, alert, oriented in mild respiratory distress Vital signs:       Please see the above listed vital signs HEENT:           Mild accessory muscle utilization, trachea midline, no thyromegaly appreciated, no oral lesions noted Cardiovascular:           Regular rate and rhythm Pulmonary:      Diminished air movement, prolonged expiratory phase with diffuse wheezing Abdominal:      Positive bowel sounds, soft exam Extremities:     Edema is appreciated, no clubbing or cyanosis Neurologic:      No focal deficits appreciated Psychiatric:      Patient is appropriate and oriented  Assessment/Plan:   Respiratory failure. Multifactorial to include acute exacerbation of COPD with significant bronchospasm along with diastolic heart failure. Chest x-ray shows diffuse vascular congestion with cardiomegaly. Echocardiogram revealed mildly reduced ejection fraction at 45-50%. He has had significant diuresis since admission Patient is on azithromycin, Rocephin, albuterol, Atrovent, Solu-Medrol, is being started on BiPAP for acute on chronic hypercapnic respiratory failure. This morning's gas revealed improvement with a pH of 7.33 and a PCO2 of 71  Elevated troponin. EKG reveals rightward axis, left atrial enlargement with poor R-wave progression and nonspecific ST-T wave changes. Troponin is only minimally elevated at 0.08  Diabetes. We'll continue Lantus and sliding scale  Hypertension  Hyperlipidemia   Tora Kindred, DO   Critical Care Total Time 35 minutes  Madisen Ludvigsen 11/06/2017  *Care during the described time interval was provided by me and/or other providers on the critical care team.  I have reviewed this patient's  available data, including medical history, events of note, physical examination and test results as part of my evaluation.

## 2017-11-06 NOTE — Progress Notes (Signed)
[facility] [place] at [facility]   PATIENT NAME: Brenda Wu    MR#:  [id number]  DATE OF BIRTH:  09/15/1955  SUBJECTIVE:  CHIEF COMPLAINT:   Chief Complaint  Patient presents with  . Shortness of Breath  No events overnight, patient still requiring BiPAP  REVIEW OF SYSTEMS:  CONSTITUTIONAL: No fever, fatigue or weakness.  EYES: No blurred or double vision.  EARS, NOSE, AND THROAT: No tinnitus or ear pain.  RESPIRATORY: No cough, shortness of breath, wheezing or hemoptysis.  CARDIOVASCULAR: No chest pain, orthopnea, edema.  GASTROINTESTINAL: No nausea, vomiting, diarrhea or abdominal pain.  GENITOURINARY: No dysuria, hematuria.  ENDOCRINE: No polyuria, nocturia,  HEMATOLOGY: No anemia, easy bruising or bleeding SKIN: No rash or lesion. MUSCULOSKELETAL: No joint pain or arthritis.   NEUROLOGIC: No tingling, numbness, weakness.  PSYCHIATRY: No anxiety or depression.   ROS  DRUG ALLERGIES:   Allergies  Allergen Reactions  . Metformin Other (See Comments)    Other reaction(s): Other (See Comments) Constipation, dry mouth, dizziness  . Bismuth Subsalicylate Rash    Pepto Bismol    VITALS:  Blood pressure 126/83, pulse 72, temperature 98.5 F (36.9 C), temperature source Axillary, resp. rate (!) 21, height 5\' 4"  (1.626 m), weight 123.6 kg, SpO2 98 %.  PHYSICAL EXAMINATION:  GENERAL:  62 y.o.-year-old patient lying in the bed with no acute distress.  EYES: Pupils equal, round, reactive to light and accommodation. No scleral icterus. Extraocular muscles intact.  HEENT: Head atraumatic, normocephalic. Oropharynx and nasopharynx clear.  NECK:  Supple, no jugular venous distention. No thyroid enlargement, no tenderness.  LUNGS: Normal breath sounds bilaterally, no wheezing, rales,rhonchi or crepitation. No use of accessory muscles of respiration.  CARDIOVASCULAR: S1, S2 normal. No murmurs, rubs, or gallops.  ABDOMEN: Soft, nontender, nondistended.  Bowel sounds present. No organomegaly or mass.  EXTREMITIES: No pedal edema, cyanosis, or clubbing.  NEUROLOGIC: Cranial nerves II through XII are intact. Muscle strength 5/5 in all extremities. Sensation intact. Gait not checked.  PSYCHIATRIC: The patient is alert and oriented x 3.  SKIN: No obvious rash, lesion, or ulcer.   Physical Exam LABORATORY PANEL:   CBC Recent Labs  Lab 11/06/17 0729  WBC 4.3  HGB 15.7  HCT 49.8*  PLT 96*   ------------------------------------------------------------------------------------------------------------------  Chemistries  Recent Labs  Lab 11/04/17 1427  11/06/17 0729  NA 138   < > 143  K 3.8   < > 3.9  CL 100   < > 98  CO2 28   < > 34*  GLUCOSE 103*   < > 95  BUN 13   < > 18  CREATININE 1.52*   < > 1.52*  CALCIUM 9.1   < > 8.7*  MG  --   --  2.3  AST 24  --   --   ALT 20  --   --   ALKPHOS 110  --   --   BILITOT 1.1  --   --    < > = values in this interval not displayed.   ------------------------------------------------------------------------------------------------------------------  Cardiac Enzymes Recent Labs  Lab 11/05/17 0606 11/05/17 1236  TROPONINI 0.08* 0.08*   ------------------------------------------------------------------------------------------------------------------  RADIOLOGY:  Dg Chest Portable 1 View  Result Date: 11/04/2017 CLINICAL DATA:  Shortness of breath EXAM: PORTABLE CHEST 1 VIEW COMPARISON:  Chest radiograph February 07, 2017 and chest CT February 11, 2017 FINDINGS: There is atelectatic change in the left mid lung. There is hazy opacity in the right mid  and lower lung zones with a right pleural effusion noted. There is cardiomegaly with pulmonary vascularity normal. No adenopathy. No bone lesions. IMPRESSION: Cardiomegaly with pulmonary vascularity unremarkable. Atelectasis left mid lung. Right pleural effusion. Hazy opacity in portions of the right mid lower lung zones may be due to a degree  of layering effusion; underlying airspace opacity/infiltrate cannot be excluded. When patient is clinically able, upright PA and lateral chest radiographs to further assess advised. Electronically Signed   By: Bretta Bang III M.D.   On: 11/04/2017 13:22    ASSESSMENT AND PLAN:   62 y/o female with chronic hypoxic respiratoray failure with COPD and chronic diastolic heart failure who presents to ED with worsening SOB over the past month.  *Acute onchronic hypoxic hypercapnic respiratory failure Due to CAP, acute on chronic diastol;ic heart failure, obstructive sleep apnea, and mild COPD exacerbation Worsening noted from inability to use CPAP overnight and subsequently transferred to ICU for further stabilization with BiPAP, continue BiPAP and wean as tolerated  On chronic 2 L via nasal cannula continuous at home  *CAP, acute Continue pneumonia protocol, empiric Rocephin/azithromycin, follow-up on cultures  *Acute on COPD exacerbation, mild Taper IV Solu-Medrol as tolerated, inhaled corticosteroids, aggressive pulmonary toilet with bronchodilator therapy, supplemental oxygen wean as tolerated  *Acute on chronic systolic congestive heart failure exacerbation  Resolving Continue congestive heart failure protocol, IV Lasix, echocardiogram noted for ejection fraction 45-50%, strict I&O monitoring, daily weights, and refer to congestive heart failure clinic upon discharge   *Acute mildly elevated troponin Inconsistent with acute coronary syndrome Most likely secondary to demand ischemia from respiratory failure Conservative management  *Chronic diabetes mellitus type 2 Sliding scale insulin with Accu-Cheks per routine  *Chronic benign essential hypertension Stable on Cardizem, Losartan  *Chronic hyperlipidemia, unspecified  Continue statin   All the records are reviewed and case discussed with Care Management/Social Workerr. Management plans discussed with the patient,  family and they are in agreement.  CODE STATUS: full  TOTAL TIME TAKING CARE OF THIS PATIENT: 40 minutes.     POSSIBLE D/C IN 2-5 DAYS, DEPENDING ON CLINICAL CONDITION.   Evelena Asa Kyrstan Gotwalt M.D on 11/06/2017   Between 7am to 6pm - Pager - [phone]  After 6pm go to [email or web address] - password [email or web address]  [facility]  Office  [phone]  CC: Primary care physician; Cheron Schaumann., MD  Note: This dictation was prepared with Dragon dictation along with smaller phrase technology. Any transcriptional errors that result from this process are unintentional.

## 2017-11-07 ENCOUNTER — Inpatient Hospital Stay: Payer: Medicare HMO

## 2017-11-07 LAB — BASIC METABOLIC PANEL
Anion gap: 6 (ref 5–15)
BUN: 22 mg/dL (ref 8–23)
CALCIUM: 8.7 mg/dL — AB (ref 8.9–10.3)
CO2: 38 mmol/L — AB (ref 22–32)
CREATININE: 1.43 mg/dL — AB (ref 0.44–1.00)
Chloride: 97 mmol/L — ABNORMAL LOW (ref 98–111)
GFR calc Af Amer: 45 mL/min — ABNORMAL LOW (ref >60)
GFR, EST NON AFRICAN AMERICAN: 39 mL/min — AB (ref >60)
Glucose, Bld: 131 mg/dL — ABNORMAL HIGH (ref 70–99)
Potassium: 3.6 mmol/L (ref 3.5–5.1)
Sodium: 141 mmol/L (ref 135–145)

## 2017-11-07 LAB — GLUCOSE, CAPILLARY
GLUCOSE-CAPILLARY: 95 mg/dL (ref 70–99)
Glucose-Capillary: 156 mg/dL — ABNORMAL HIGH (ref 70–99)
Glucose-Capillary: 102 mg/dL — ABNORMAL HIGH (ref 70–99)
Glucose-Capillary: 65 mg/dL — ABNORMAL LOW (ref 70–99)
Glucose-Capillary: 191 mg/dL — ABNORMAL HIGH (ref 70–99)

## 2017-11-07 LAB — PROCALCITONIN: Procalcitonin: <0.1 ng/mL

## 2017-11-07 MED ORDER — BISACODYL 10 MG RE SUPP
10.0000 mg | Freq: Every day | RECTAL | Status: DC | PRN
  Filled 2017-11-07: qty 1

## 2017-11-07 MED ORDER — METOLAZONE 5 MG PO TABS
5.0000 mg | ORAL_TABLET | Freq: Once | ORAL | Status: AC
  Administered 2017-11-07: 12:00:00 5 mg via ORAL
  Filled 2017-11-07: qty 1

## 2017-11-07 MED ORDER — HYDROCODONE-ACETAMINOPHEN 5-325 MG PO TABS
1.0000 | ORAL_TABLET | Freq: Four times a day (QID) | ORAL | Status: DC | PRN
  Administered 2017-11-07 – 2017-11-08 (×3): 1 via ORAL
  Filled 2017-11-07 (×3): qty 1

## 2017-11-07 MED ORDER — POLYETHYLENE GLYCOL 3350 17 G PO PACK: 17.0000 g | PACK | Freq: Every day | ORAL | Status: DC | PRN

## 2017-11-07 NOTE — Evaluation (Signed)
Physical Therapy Evaluation Patient Details Name: Brenda Wu MRN: [id number] DOB: [birth year] Today's Date: 11/07/2017   History of Present Illness  Pt is a 62 y.o female with PMH significant for hypertension, diabetes, seizure disorder, CHF, and COPD, presenting with shortness of breath. She normally wears 2 L of oxygen at home. Pt transferred to ICU on 9/14 with deteriorated respiratory status and BiPAP. Pt transferred back to floor 9/16.   Clinical Impression  Prior to hospitalization pt was independent with all ADL's and community ambulation with no AD. Currently pt is mod independent with bed mobility and CGA for sit to stand transfer. Pt demonstrated hesitation and increased difficulty during sit to stand requiring heavy assist from UE to achieve upright standing. Ambulation in hallway held this date due to blood sugar of 65. Pt ambulated 5 feet to chair with CGA, no LOB but increased time. SaO2 taken following transfer to chair ranging from trending up from 86%-93%.Nurse reports pt ambulated to bathroom with SOB and 1 LOB earlier today.  Pt would benefit from skilled HHPT for strengthening, balance, and overall endurance with continued monitoring of SaO2 to promote safe independence.    Follow Up Recommendations Home health PT    Equipment Recommendations  Cane(pt currently has SPC at home)    Recommendations for Other Services       Precautions / Restrictions Precautions Precautions: None Restrictions Weight Bearing Restrictions: No      Mobility  Bed Mobility Overal bed mobility: Modified Independent             General bed mobility comments: No assistance required for supine to sit, completed quickly without hesitation, required 2 UE assist on bed rail  Transfers Overall transfer level: Needs assistance Equipment used: None Transfers: Sit to/from Stand Sit to Stand: Min guard         General transfer comment: Min guard for safety, able to perform  independently with 2 UE assist from bed rail, no hesitation  Ambulation/Gait             General Gait Details: Ambulation held due to blood glucose value 65  Stairs            Wheelchair Mobility    Modified Rankin (Stroke Patients Only)       Balance                                             Pertinent Vitals/Pain Pain Assessment: No/denies pain    Home Living Family/patient expects to be discharged to:: Private residence Living Arrangements: Spouse/significant other   Type of Home: House       Home Layout: One level Home Equipment: Cane - single point      Prior Function Level of Independence: Independent               Hand Dominance        Extremity/Trunk Assessment   Upper Extremity Assessment Upper Extremity Assessment: Generalized weakness    Lower Extremity Assessment Lower Extremity Assessment: Generalized weakness       Communication   Communication: No difficulties  Cognition Arousal/Alertness: Awake/alert Behavior During Therapy: WFL for tasks assessed/performed Overall Cognitive Status: Within Functional Limits for tasks assessed  General Comments      Exercises     Assessment/Plan    PT Assessment Patient needs continued PT services  PT Problem List Decreased strength;Decreased mobility;Decreased activity tolerance;Decreased balance;Cardiopulmonary status limiting activity       PT Treatment Interventions DME instruction;Functional mobility training;Balance training;Patient/family education;Neuromuscular re-education;Therapeutic activities;Gait training;Stair training;Therapeutic exercise    PT Goals (Current goals can be found in the Care Plan section)  Acute Rehab PT Goals Patient Stated Goal: To go home  PT Goal Formulation: With patient Time For Goal Achievement: 11/21/17 Potential to Achieve Goals: Good    Frequency Min  2X/week   Barriers to discharge        Co-evaluation               AM-PAC PT "6 Clicks" Daily Activity  Outcome Measure Difficulty turning over in bed (including adjusting bedclothes, sheets and blankets)?: A Little Difficulty moving from lying on back to sitting on the side of the bed? : A Little Difficulty sitting down on and standing up from a chair with arms (e.g., wheelchair, bedside commode, etc,.)?: A Little Help needed moving to and from a bed to chair (including a wheelchair)?: A Little Help needed walking in hospital room?: A Little Help needed climbing 3-5 steps with a railing? : A Lot 6 Click Score: 17    End of Session Equipment Utilized During Treatment: Gait belt Activity Tolerance: Patient tolerated treatment well;Treatment limited secondary to medical complications (Comment)(deferred ambulation due to low blood sugar concerns) Patient left: in chair;with chair alarm set;with family/visitor present Nurse Communication: Other (comment)(blood glucose levels) PT Visit Diagnosis: Muscle weakness (generalized) (M62.81)    Time: 6546-5035 PT Time Calculation (min) (ACUTE ONLY): 27 min   Charges:              Mickel Duhamel, SPT 11/07/2017, 1:40 PM

## 2017-11-07 NOTE — Progress Notes (Signed)
[employer] - [place] at [facility]   PATIENT NAME: Brenda Wu    MR#:  [id number]  DATE OF BIRTH:  1955/05/08  SUBJECTIVE:  CHIEF COMPLAINT:   Chief Complaint  Patient presents with  . Shortness of Breath   No events overnight, for transfer to regular nursing floor later today REVIEW OF SYSTEMS:  CONSTITUTIONAL: No fever, fatigue or weakness.  EYES: No blurred or double vision.  EARS, NOSE, AND THROAT: No tinnitus or ear pain.  RESPIRATORY: No cough, shortness of breath, wheezing or hemoptysis.  CARDIOVASCULAR: No chest pain, orthopnea, edema.  GASTROINTESTINAL: No nausea, vomiting, diarrhea or abdominal pain.  GENITOURINARY: No dysuria, hematuria.  ENDOCRINE: No polyuria, nocturia,  HEMATOLOGY: No anemia, easy bruising or bleeding SKIN: No rash or lesion. MUSCULOSKELETAL: No joint pain or arthritis.   NEUROLOGIC: No tingling, numbness, weakness.  PSYCHIATRY: No anxiety or depression.   ROS  DRUG ALLERGIES:   Allergies  Allergen Reactions  . Metformin Other (See Comments)    Other reaction(s): Other (See Comments) Constipation, dry mouth, dizziness  . Bismuth Subsalicylate Rash    Pepto Bismol    VITALS:  Blood pressure 110/73, pulse 88, temperature 98.1 F (36.7 C), temperature source Oral, resp. rate 20, height 5\' 4"  (1.626 m), weight 123.9 kg, SpO2 (!) 89 %.  PHYSICAL EXAMINATION:  GENERAL:  62 y.o.-year-old patient lying in the bed with no acute distress.  EYES: Pupils equal, round, reactive to light and accommodation. No scleral icterus. Extraocular muscles intact.  HEENT: Head atraumatic, normocephalic. Oropharynx and nasopharynx clear.  NECK:  Supple, no jugular venous distention. No thyroid enlargement, no tenderness.  LUNGS: Normal breath sounds bilaterally, no wheezing, rales,rhonchi or crepitation. No use of accessory muscles of respiration.  CARDIOVASCULAR: S1, S2 normal. No murmurs, rubs, or gallops.  ABDOMEN: Soft, nontender,  nondistended. Bowel sounds present. No organomegaly or mass.  EXTREMITIES: No pedal edema, cyanosis, or clubbing.  NEUROLOGIC: Cranial nerves II through XII are intact. Muscle strength 5/5 in all extremities. Sensation intact. Gait not checked.  PSYCHIATRIC: The patient is alert and oriented x 3.  SKIN: No obvious rash, lesion, or ulcer.   Physical Exam LABORATORY PANEL:   CBC Recent Labs  Lab 11/06/17 0729  WBC 4.3  HGB 15.7  HCT 49.8*  PLT 96*   ------------------------------------------------------------------------------------------------------------------  Chemistries  Recent Labs  Lab 11/04/17 1427  11/06/17 0729 11/07/17 0430  NA 138   < > 143 141  K 3.8   < > 3.9 3.6  CL 100   < > 98 97*  CO2 28   < > 34* 38*  GLUCOSE 103*   < > 95 131*  BUN 13   < > 18 22  CREATININE 1.52*   < > 1.52* 1.43*  CALCIUM 9.1   < > 8.7* 8.7*  MG  --   --  2.3  --   AST 24  --   --   --   ALT 20  --   --   --   ALKPHOS 110  --   --   --   BILITOT 1.1  --   --   --    < > = values in this interval not displayed.   ------------------------------------------------------------------------------------------------------------------  Cardiac Enzymes Recent Labs  Lab 11/05/17 0606 11/05/17 1236  TROPONINI 0.08* 0.08*   ------------------------------------------------------------------------------------------------------------------  RADIOLOGY:  Dg Chest Port 1 View  Result Date: 11/07/2017 CLINICAL DATA:  Acute respiratory failure. History of asthma-COPD, CHF. EXAM: PORTABLE CHEST  1 VIEW COMPARISON:  Portable chest x-ray of November 04, 2017 FINDINGS: The lungs are mildly hyperinflated. The cardiac silhouette is enlarged. The pulmonary vascularity is mildly engorged but has improved somewhat since the previous study. The interstitial markings remain increased especially in the mid and lower lungs. There may be a right pleural effusion. IMPRESSION: Slight interval improvement in the  appearance of the chest since the previous study. Slightly decreased interstitial edema bilaterally. Electronically Signed   By: David  [place] M.D.   On: 11/07/2017 07:16    ASSESSMENT AND PLAN:  62 y/o female with chronic hypoxic respiratoray failure with COPD and chronic diastolic heart failure who presents to ED with worsening SOB over the past month.  *Acute onchronic hypoxichypercapnicrespiratory failure Much improved Due toCAP, acute onchronic diastol;ic heart failure,obstructive sleep apnea,and mild COPD exacerbation Worsening noted from inability to use CPAP overnight and subsequently transferred to ICU for further stabilization with BiPAP, successfully weaned off BiPAP, for transfer to regular nursing floor later today   On chronic 2 L via nasal cannula continuous at home  *CAP, acute Resolving Continue pneumonia protocol, empiric Rocephin/azithromycin for 5-7-day course  *Acute on COPD exacerbation, mild Much improved Taper IV Solu-Medrol as tolerated, inhaled corticosteroids, aggressive pulmonary toilet with bronchodilator therapy, supplemental oxygen wean as tolerated  *Acute on chronic systolic congestive heart failure exacerbation  Resolving Continue CHF protocol, IV Lasix, echo noted for EF 45-50%, strict I&O monitoring, daily weights, and will refer to CHF clinic upon discharge  *Acute mildly elevated troponin Inconsistent with acute coronary syndrome Most likely secondary to demand ischemia from respiratory failure Conservative management  *Chronic diabetes mellitus type 2 Stable Sliding scale insulin with Accu-Cheks per routine  *Chronic benign essential hypertension Stable continueCardizem, Losartan  *Chronic hyperlipidemia, unspecified Continue statin  Transfer to regular nursing floor later today  All the records are reviewed and case discussed with Care Management/Social Workerr. Management plans discussed with the patient, family  and they are in agreement.  CODE STATUS: full  TOTAL TIME TAKING CARE OF THIS PATIENT: 35 minutes.     POSSIBLE D/C IN 1-3 DAYS, DEPENDING ON CLINICAL CONDITION.   Evelena Asa Salary M.D on 11/07/2017   Between 7am to 6pm - Pager - [phone]  After 6pm go to [email or web address] - password [employer]  [facility]  Office  [phone]  CC: Primary care physician; Cheron Schaumann., MD  Note: This dictation was prepared with Dragon dictation along with smaller phrase technology. Any transcriptional errors that result from this process are unintentional.

## 2017-11-08 LAB — BASIC METABOLIC PANEL
Anion gap: 9 (ref 5–15)
BUN: 26 mg/dL — AB (ref 8–23)
CALCIUM: 8.5 mg/dL — AB (ref 8.9–10.3)
CO2: 38 mmol/L — ABNORMAL HIGH (ref 22–32)
CREATININE: 1.56 mg/dL — AB (ref 0.44–1.00)
Chloride: 92 mmol/L — ABNORMAL LOW (ref 98–111)
GFR calc non Af Amer: 35 mL/min — ABNORMAL LOW (ref >60)
GFR, EST AFRICAN AMERICAN: 40 mL/min — AB (ref >60)
Glucose, Bld: 220 mg/dL — ABNORMAL HIGH (ref 70–99)
Potassium: 3.7 mmol/L (ref 3.5–5.1)
SODIUM: 139 mmol/L (ref 135–145)

## 2017-11-08 LAB — GLUCOSE, CAPILLARY
GLUCOSE-CAPILLARY: 195 mg/dL — AB (ref 70–99)
GLUCOSE-CAPILLARY: 201 mg/dL — AB (ref 70–99)
GLUCOSE-CAPILLARY: 58 mg/dL — AB (ref 70–99)
GLUCOSE-CAPILLARY: 68 mg/dL — AB (ref 70–99)
GLUCOSE-CAPILLARY: 128 mg/dL — AB (ref 70–99)
GLUCOSE-CAPILLARY: 132 mg/dL — AB (ref 70–99)
GLUCOSE-CAPILLARY: 191 mg/dL — AB (ref 70–99)
Glucose-Capillary: 104 mg/dL — ABNORMAL HIGH (ref 70–99)
Glucose-Capillary: 31 mg/dL — CL (ref 70–99)
Glucose-Capillary: 45 mg/dL — ABNORMAL LOW (ref 70–99)
Glucose-Capillary: 56 mg/dL — ABNORMAL LOW (ref 70–99)

## 2017-11-08 MED ORDER — PREDNISONE 50 MG PO TABS: 20.0000 mg | ORAL_TABLET | Freq: Every day | ORAL | 0 refills | Status: DC

## 2017-11-08 MED ORDER — FUROSEMIDE 40 MG PO TABS: 80.0000 mg | ORAL_TABLET | Freq: Every day | ORAL | 11 refills | Status: DC

## 2017-11-08 MED ORDER — LEVOFLOXACIN 750 MG PO TABS: 750.0000 mg | ORAL_TABLET | Freq: Every day | ORAL | 0 refills | Status: AC

## 2017-11-08 MED ORDER — IPRATROPIUM-ALBUTEROL 0.5-2.5 (3) MG/3ML IN SOLN
3.0000 mL | Freq: Four times a day (QID) | RESPIRATORY_TRACT | Status: DC
  Administered 2017-11-08 – 2017-11-09 (×3): 3 mL via RESPIRATORY_TRACT
  Filled 2017-11-08 (×3): qty 3

## 2017-11-08 NOTE — Plan of Care (Signed)

## 2017-11-08 NOTE — Progress Notes (Signed)
Inpatient Diabetes Program Recommendations  AACE/ADA: New Consensus Statement on Inpatient Glycemic Control (2019)  Target Ranges:  Prepandial:   less than 140 mg/dL      Peak postprandial:   less than 180 mg/dL (1-2 hours)      Critically ill patients:  140 - 180 mg/dL   Results for Brenda Wu, Brenda Wu (MRN [id number]) as of 11/08/2017 08:31  Ref. Range 11/07/2017 07:29 11/07/2017 12:30 11/07/2017 16:56 11/07/2017 21:37 11/08/2017 08:23  Glucose-Capillary Latest Ref Range: 70 - 99 mg/dL 272 (H) 65 (L) 95 536 (H) 195 (H)   Review of Glycemic Control  Diabetes history: DM2 Outpatient Diabetes medications: Lantus 15 units daily, Glipizide 5 mg BID, Januvia 50 mg daily Current orders for Inpatient glycemic control: Lantus 15 units daily, Novolog 0-20 units TID with meals, Novolog 0-5 units QHS, Glipizide 5 mg BID, Tradjenta 5 mg daily; Solumedrol 60 mg Q24H  Inpatient Diabetes Program Recommendations:  Oral Agents: Noted glucose down to 65 mg/dl on 6/44/03. May want to consider decreasing or discontinuing Glipizide while inpatient if patient experiences any other issues with hypoglycemia.   Thanks, Orlando Penner, RN, MSN, CDE Diabetes Coordinator Inpatient Diabetes Program [phone] (Team Pager from 8am to 5pm)

## 2017-11-08 NOTE — Progress Notes (Signed)
CBG at 1238 read 201. Unable to dose insulin within 1 hour.  Pt CBG at recheck 104 and did not meet insulin dose parameters.

## 2017-11-08 NOTE — Discharge Summary (Signed)
[facility] - [place] at [facility]   PATIENT NAME: Brenda Wu    MR#:  [id number]  DATE OF BIRTH:  [birth year]  DATE OF ADMISSION:  11/04/2017 ADMITTING PHYSICIAN: Adrian Saran, MD  DATE OF DISCHARGE: No discharge date for patient encounter.  PRIMARY CARE PHYSICIAN: Cheron Schaumann., MD    ADMISSION DIAGNOSIS:  SOB (shortness of breath) [R06.02] Hypoxia [R09.02] Congestive heart failure, unspecified HF chronicity, unspecified heart failure type (HCC) [I50.9]  DISCHARGE DIAGNOSIS:  Active Problems:   Respiratory failure (HCC)   SECONDARY DIAGNOSIS:   Past Medical History:  Diagnosis Date  . CHF (congestive heart failure) (HCC)   . COPD (chronic obstructive pulmonary disease) (HCC)   . Diabetes mellitus without complication (HCC)   . Hypercholesteremia   . Hypertension   . Seizures [facility])     HOSPITAL COURSE:  62 y/o female with chronic hypoxic respiratoray failure with COPD and chronic diastolic heart failure who presents to ED with worsening SOB over the past month.  *Acute onchronic hypoxichypercapnicrespiratory failure Resolved Due toCAP, acute onchronic diastol;ic heart failure,obstructive sleep apnea,and mild COPD exacerbation Did require short stay in the ICU for BiPAP/CPAP, successfully weaned back to her O2 requirement of 2 L via nasal cannula continuous, will have patient establish care with local primary care provider and pulmonologist per her wishes upon discharge   *CAP, acute Resolving Treated on our pneumonia protocol, provided empiric Rocephin/azithromycin  while in house  *Acute on COPD exacerbation, mild Exacerbated by pneumonia Resolved Treated with IV Solu-Medrol, aggressive pulmonary toilet bronchodilator therapy, and patient did well   *Acute on chronicsystolic congestive heart failure exacerbation  Resolved Treated on our CHF protocol, IV Lasix,echo noted for EF 45-50%,strict I&O monitoring, daily  weights, and  patient was referred to congestive heart failure clinic on day of discharge   *Acute mildly elevated troponin Inconsistent with acute coronary syndrome Most likely secondary to demand ischemia from respiratory failure Conservative management  *Chronic diabetes mellitus type 2 Stable on her current regiment  *Chronic benign essential hypertension Stable continuedCardizem, Losartan  *Chronic hyperlipidemia, unspecified Continued statin   DISCHARGE CONDITIONS:   stable  CONSULTS OBTAINED:    DRUG ALLERGIES:   Allergies  Allergen Reactions  . Metformin Other (See Comments)    Other reaction(s): Other (See Comments) Constipation, dry mouth, dizziness  . Bismuth Subsalicylate Rash    Pepto Bismol    DISCHARGE MEDICATIONS:   Allergies as of 11/08/2017      Reactions   Metformin Other (See Comments)   Other reaction(s): Other (See Comments) Constipation, dry mouth, dizziness   Bismuth Subsalicylate Rash   Pepto Bismol      Medication List    TAKE these medications   albuterol 108 (90 Base) MCG/ACT inhaler Commonly known as:  PROVENTIL HFA;VENTOLIN HFA Inhale 2 puffs into the lungs 4 (four) times daily as needed for wheezing or shortness of breath.   albuterol (2.5 MG/3ML) 0.083% nebulizer solution Commonly known as:  PROVENTIL Inhale 2.5 mg into the lungs every 6 (six) hours as needed for wheezing or shortness of breath.   atorvastatin 40 MG tablet Commonly known as:  LIPITOR Take 40 mg by mouth daily.   diltiazem 60 MG 12 hr capsule Commonly known as:  CARDIZEM SR Take 60 mg by mouth daily.   ferrous sulfate 325 (65 FE) MG tablet Take 325 mg by mouth daily with breakfast.   furosemide 40 MG tablet Commonly known as:  LASIX Take 2 tablets (80 mg total)  by mouth daily. What changed:  how much to take   glipiZIDE 5 MG tablet Commonly known as:  GLUCOTROL Take 5 mg by mouth 2 (two) times daily before a meal.   GOODSENSE ASPIRIN 325  MG tablet Generic drug:  aspirin Take 325 mg by mouth daily.   guaiFENesin 600 MG 12 hr tablet Commonly known as:  MUCINEX Take 2 tablets (1,200 mg total) by mouth 2 (two) times daily.   JANUVIA 50 MG tablet Generic drug:  sitaGLIPtin Take 50 mg by mouth daily.   lamoTRIgine 200 MG tablet Commonly known as:  LAMICTAL Take 200 mg by mouth 2 (two) times daily.   LANTUS SOLOSTAR 100 UNIT/ML Solostar Pen Generic drug:  Insulin Glargine Inject 15 Units into the skin daily.   levofloxacin 750 MG tablet Commonly known as:  LEVAQUIN Take 1 tablet (750 mg total) by mouth daily for 7 days.   losartan 50 MG tablet Commonly known as:  COZAAR Take 50 mg by mouth daily.   MULTI-VITAMINS Tabs Take 1 tablet by mouth daily.   niacin 500 MG CR capsule Take 500 mg by mouth daily.   potassium chloride SA 20 MEQ tablet Commonly known as:  K-DUR,KLOR-CON Take 1 tablet (20 mEq total) by mouth daily.   predniSONE 50 MG tablet Commonly known as:  DELTASONE Take 0.5 tablets (25 mg total) by mouth daily with breakfast. What changed:    medication strength  how much to take  when to take this  additional instructions   pregabalin 75 MG capsule Commonly known as:  LYRICA Take 75 mg by mouth 2 (two) times daily.   senna-docusate 8.6-50 MG tablet Commonly known as:  Senokot-S Take 1 tablet by mouth 2 (two) times daily.   SYMBICORT 160-4.5 MCG/ACT inhaler Generic drug:  budesonide-formoterol Inhale 2 puffs into the lungs 2 (two) times daily.   tiotropium 18 MCG inhalation capsule Commonly known as:  SPIRIVA Place 18 mcg into inhaler and inhale daily.   topiramate 200 MG tablet Commonly known as:  TOPAMAX Take 200 mg by mouth 2 (two) times daily.   vitamin B-12 500 MCG tablet Commonly known as:  CYANOCOBALAMIN Take 500 mcg by mouth daily.        DISCHARGE INSTRUCTIONS:  If you experience worsening of your admission symptoms, develop shortness of breath, life threatening  emergency, suicidal or homicidal thoughts you must seek medical attention immediately by calling 911 or calling your MD immediately  if symptoms less severe.  You Must read complete instructions/literature along with all the possible adverse reactions/side effects for all the Medicines you take and that have been prescribed to you. Take any new Medicines after you have completely understood and accept all the possible adverse reactions/side effects.   Please note  You were cared for by a hospitalist during your hospital stay. If you have any questions about your discharge medications or the care you received while you were in the hospital after you are discharged, you can call the unit and asked to speak with the hospitalist on call if the hospitalist that took care of you is not available. Once you are discharged, your primary care physician will handle any further medical issues. Please note that NO REFILLS for any discharge medications will be authorized once you are discharged, as it is imperative that you return to your primary care physician (or establish a relationship with a primary care physician if you do not have one) for your aftercare needs so that they can  reassess your need for medications and monitor your lab values.    Today   CHIEF COMPLAINT:   Chief Complaint  Patient presents with  . Shortness of Breath    HISTORY OF PRESENT ILLNESS:  62 y.o. female with a known history of chronic hypoxic respiratory failure on 2 L of oxygen due to COPD and chronic diastolic heart failure, diabetes and hypertension who presents to the emergency room due to shortness of breath.  Patient reports over the past month she has had increasing shortness of breath, lower extremity edema, increased abdominal girth and wheezing.  Over the past 2 days it has become even worse.  She denies chest pain however has PND and orthopnea.  Upon arrival in the ER patient's O2 saturation on 2 L 82%.  She denies  fever or chills.  She has had a cough and wheezing. She is currently on nonrebreather  VITAL SIGNS:  Blood pressure 125/77, pulse 83, temperature 98.7 F (37.1 C), resp. rate 19, height 5\' 4"  (1.626 m), weight 123 kg, SpO2 92 %.  I/O:    Intake/Output Summary (Last 24 hours) at 11/08/2017 1217 Last data filed at 11/08/2017 1042 Gross per 24 hour  Intake 1507.98 ml  Output 1700 ml  Net -192.02 ml    PHYSICAL EXAMINATION:  GENERAL:  62 y.o.-year-old patient lying in the bed with no acute distress.  EYES: Pupils equal, round, reactive to light and accommodation. No scleral icterus. Extraocular muscles intact.  HEENT: Head atraumatic, normocephalic. Oropharynx and nasopharynx clear.  NECK:  Supple, no jugular venous distention. No thyroid enlargement, no tenderness.  LUNGS: Normal breath sounds bilaterally, no wheezing, rales,rhonchi or crepitation. No use of accessory muscles of respiration.  CARDIOVASCULAR: S1, S2 normal. No murmurs, rubs, or gallops.  ABDOMEN: Soft, non-tender, non-distended. Bowel sounds present. No organomegaly or mass.  EXTREMITIES: No pedal edema, cyanosis, or clubbing.  NEUROLOGIC: Cranial nerves II through XII are intact. Muscle strength 5/5 in all extremities. Sensation intact. Gait not checked.  PSYCHIATRIC: The patient is alert and oriented x 3.  SKIN: No obvious rash, lesion, or ulcer.   DATA REVIEW:   CBC Recent Labs  Lab 11/06/17 0729  WBC 4.3  HGB 15.7  HCT 49.8*  PLT 96*    Chemistries  Recent Labs  Lab 11/04/17 1427  11/06/17 0729  11/08/17 0449  NA 138   < > 143   < > 139  K 3.8   < > 3.9   < > 3.7  CL 100   < > 98   < > 92*  CO2 28   < > 34*   < > 38*  GLUCOSE 103*   < > 95   < > 220*  BUN 13   < > 18   < > 26*  CREATININE 1.52*   < > 1.52*   < > 1.56*  CALCIUM 9.1   < > 8.7*   < > 8.5*  MG  --   --  2.3  --   --   AST 24  --   --   --   --   ALT 20  --   --   --   --   ALKPHOS 110  --   --   --   --   BILITOT 1.1  --   --    --   --    < > = values in this interval not displayed.    Cardiac Enzymes Recent Labs  Lab 11/05/17 1236  TROPONINI 0.08*    Microbiology Results  Results for orders placed or performed during the hospital encounter of 11/04/17  MRSA PCR Screening     Status: None   Collection Time: 11/05/17 10:16 AM  Result Value Ref Range Status   MRSA by PCR NEGATIVE NEGATIVE Final    Comment:        The GeneXpert MRSA Assay (FDA approved for NASAL specimens only), is one component of a comprehensive MRSA colonization surveillance program. It is not intended to diagnose MRSA infection nor to guide or monitor treatment for MRSA infections. Performed at [facility], [address] [place] [place], [place] [address]     RADIOLOGY:  Dg Chest Port 1 View  Result Date: 11/07/2017 CLINICAL DATA:  Acute respiratory failure. History of asthma-COPD, CHF. EXAM: PORTABLE CHEST 1 VIEW COMPARISON:  Portable chest x-ray of November 04, 2017 FINDINGS: The lungs are mildly hyperinflated. The cardiac silhouette is enlarged. The pulmonary vascularity is mildly engorged but has improved somewhat since the previous study. The interstitial markings remain increased especially in the mid and lower lungs. There may be a right pleural effusion. IMPRESSION: Slight interval improvement in the appearance of the chest since the previous study. Slightly decreased interstitial edema bilaterally. Electronically Signed   By: David  [place] M.D.   On: 11/07/2017 07:16    EKG:   Orders placed or performed during the hospital encounter of 11/04/17  . ED EKG within 10 minutes  . ED EKG within 10 minutes  . EKG 12-Lead  . EKG 12-Lead  . EKG      Management plans discussed with the patient, family and they are in agreement.  CODE STATUS:     Code Status Orders  (From admission, onward)         Start     Ordered   11/04/17 1539  Full code  Continuous     11/04/17 1538        Code Status History     Date Active Date Inactive Code Status Order ID Comments User Context   02/07/2017 0909 02/12/2017 1620 Full Code [id number]  Shaune Pollack, MD Inpatient   11/01/2016 1726 11/04/2016 1447 Full Code [id number]  Gracelyn Nurse, MD Inpatient      TOTAL TIME TAKING CARE OF THIS PATIENT: 45 minutes.    Evelena Asa Salary M.D on 11/08/2017 at 12:17 PM  Between 7am to 6pm - Pager - [phone]  After 6pm go to [email or web address] - password [email or web address]  [facility]  Office  [phone]  CC: Primary care physician; Cheron Schaumann., MD   Note: This dictation was prepared with Dragon dictation along with smaller phrase technology. Any transcriptional errors that result from this process are unintentional.

## 2017-11-08 NOTE — Progress Notes (Signed)
CRITICAL VALUE ALERT  Critical Value:  CBG 31  Date & Time Notied:  1659 11/08/2017  Provider Notified: Katheren Shams, MD  Orders Received/Actions taken: 1700 Medication for DM held. Hypoglycemic protocol followed. Discharge will be held until [facility] with in norm for at least 1 hour

## 2017-11-08 NOTE — Care Management Note (Signed)
Case Management Note  Patient Details  Name: Brenda Wu MRN: [id number] Date of Birth: [birth year]  Subjective/Objective:  Spoke with patient regarding discharge planning. She lives at home alone. Will need a walker. [facility]dered from [facility]. Provided a list of home health agencies and she prefers [facility]. Referral to [facility] with [facility] for RN and PT. Patient has home O2 with [facility]. PCP is Dr. Alwyn Ren. Discharging today by car.                   Action/Plan:   Expected Discharge Date:  11/08/17               Expected Discharge Plan:  Home w Home Health Services  In-House Referral:     Discharge planning Services  CM Consult  Post Acute Care Choice:  Durable Medical Equipment, Home Health Choice offered to:  Patient  DME Arranged:  Walker rolling DME Agency:  [facility]  HH Arranged:  RN, PT [facility] Agency:  [facility]  Status of Service:  Completed, signed off  If discussed at Long Length of Stay Meetings, dates discussed:    Additional Comments:  Marily Memos, RN 11/08/2017, 2:04 PM

## 2017-11-08 NOTE — Plan of Care (Signed)
Pt CBG of 31. Hypoglycemic protocol followed monitored through change of shift. Per MD CBG needs to be within normal range for 1 hour prior to continuing with discharge On coming nurse made aware. States will continue to monitor.

## 2017-11-08 NOTE — Progress Notes (Addendum)
Physical Therapy Treatment Patient Details Name: Brenda Wu MRN: [id number] DOB: 12-20-1955 Today's Date: 11/08/2017    History of Present Illness Pt is a 62 y.o female with PMH significant for hypertension, diabetes, seizure disorder, CHF, and COPD, presenting with shortness of breath. She normally wears 2 L of oxygen at home. Pt transferred to ICU on 9/14 with deteriorated respiratory status and BiPAP. Pt transferred back to floor 9/16.     PT Comments    Pt progressing well towards goals with good effort this date in ambulation. Pt showing good functionality ambulating 150 ft with the walker and 150 ft without the walker with little to no change noted in gait mechanics demonstrating limited reliance on walker. SpO2 monitored this date maintained in the mid 80's during ambulation and reaching a low of 78% following ambulation, but returning to mid 80's following rest. Pt symptom free throughout treatment with signs or symptoms of respiratory distress. At baseline, pt reports increasing O2 supplementation above 2L during exertion. Pt continues to benefit from skilled PT and remains most appropriate for HHPT upon discharge.    Follow Up Recommendations  Home health PT     Equipment Recommendations  None recommended by PT    Recommendations for Other Services       Precautions / Restrictions Precautions Precautions: Fall Restrictions Weight Bearing Restrictions: No    Mobility  Bed Mobility Overal bed mobility: Independent Bed Mobility: Supine to Sit     Supine to sit: Independent        Transfers Overall transfer level: Needs assistance Equipment used: Rolling walker (2 wheeled) Transfers: Sit to/from Stand Sit to Stand: Supervision         General transfer comment: Good effort with sit to stand, no increase in pain or fatigue, able to perform without assistance, educated pt on hand placement for safety  Ambulation/Gait Ambulation/Gait assistance:  Supervision Gait Distance (Feet): 300 Feet Assistive device: Rolling walker (2 wheeled)(150 ft with walker, 150 with no AD occasional 1 UE hold on rail)       General Gait Details: Pt ambulated with subjective confidence, no significant gait deviations, good cadence, and consistent gait mechanics with and without a walker. SpO2 taken during ambulation maintaining 80's, however dropping to 78% and recovering to low 80's following ambulation. Pt showing no signs or symptoms related to respiratory distress.    Stairs             Wheelchair Mobility    Modified Rankin (Stroke Patients Only)       Balance                                            Cognition Arousal/Alertness: Awake/alert                                            Exercises      General Comments        Pertinent Vitals/Pain Pain Assessment: No/denies pain    Home Living                      Prior Function            PT Goals (current goals can now be found in the care plan section) Progress towards  PT goals: Progressing toward goals    Frequency    Min 2X/week      PT Plan Current plan remains appropriate    Co-evaluation              AM-PAC PT "6 Clicks" Daily Activity  Outcome Measure  Difficulty turning over in bed (including adjusting bedclothes, sheets and blankets)?: None Difficulty moving from lying on back to sitting on the side of the bed? : None Difficulty sitting down on and standing up from a chair with arms (e.g., wheelchair, bedside commode, etc,.)?: None Help needed moving to and from a bed to chair (including a wheelchair)?: None Help needed walking in hospital room?: A Little Help needed climbing 3-5 steps with a railing? : A Lot 6 Click Score: 21    End of Session Equipment Utilized During Treatment: Gait belt;Oxygen Activity Tolerance: Patient tolerated treatment well Patient left: with call bell/phone within  reach;with chair alarm set Nurse Communication: Mobility status PT Visit Diagnosis: Muscle weakness (generalized) (M62.81)     Time: 3790-2409 PT Time Calculation (min) (ACUTE ONLY): 19 min  Charges:                       During this treatment session, the therapist was present, participating in and directing the treatment.  This note has been reviewed and this clinician agrees with the information provided.    Loran Senters, PT, DPT [phone]   Mickel Duhamel, SPT 11/08/2017, 1:11 PM

## 2017-11-08 NOTE — Progress Notes (Addendum)
Pt BS within normal range for 1 hour  Ready for discharge following receipt of printed prescription, daytime nurse Merry Proud, RN send text page to Dr. Katheren Shams approx  [phone].  Still waiting response. I currently signed RN paged on call Hospitalist still waiting response. Prescriptions were found in chart given to PT and when over all discharge instructions with pt. Pt verbalized understanding  After everything was taken care of prescriptions, paper work etc pt did not have a ride home.  Pt kept saying "that her ride was  Coming, but finally she said "They are not coming I cannot get in touch with my son." Made , Sridharon, MD  aware,  Discharge orders modified for D/C todayTele box replaced, gown put on pt and went  back to bed until AM

## 2017-11-08 NOTE — Progress Notes (Signed)
[facility]  Next of kin: Gaitlin Steve [phone]   If patient discharges after hours, please call [phone].   Dimple Casey 11/08/2017, 2:59 PM

## 2017-11-08 NOTE — Care Management Important Message (Signed)
Copy of signed IM left with patient in room.  

## 2017-11-09 LAB — GLUCOSE, CAPILLARY: GLUCOSE-CAPILLARY: 177 mg/dL — AB (ref 70–99)

## 2017-11-09 MED ORDER — CEFDINIR 300 MG PO CAPS
300.0000 mg | ORAL_CAPSULE | Freq: Two times a day (BID) | ORAL | Status: DC
  Filled 2017-11-09 (×2): qty 1

## 2017-11-09 MED ORDER — AZITHROMYCIN 500 MG PO TABS
250.0000 mg | ORAL_TABLET | Freq: Every day | ORAL | Status: DC
  Filled 2017-11-09: qty 1

## 2017-11-09 MED ORDER — FUROSEMIDE 40 MG PO TABS
80.0000 mg | ORAL_TABLET | Freq: Every day | ORAL | Status: DC
  Filled 2017-11-09: qty 2

## 2017-11-09 MED ORDER — PREDNISONE 50 MG PO TABS
50.0000 mg | ORAL_TABLET | Freq: Every day | ORAL | Status: DC
  Filled 2017-11-09: qty 1

## 2017-11-09 NOTE — Discharge Planning (Signed)
Patient IV removed.  Discharge papers given, explained and educated.  RN assessment and VS revealed stability for DC to home. Informed of suggested FU visit and patient understands that since it's a walk-in clinic we cannot set up an advance appt.  Levaquin script given.  Wheeled to front and family transported home via car.

## 2017-11-09 NOTE — Discharge Summary (Signed)
[facility] - [place] [facility]   PATIENT NAME: Brenda Wu    MR#:  [id number]  DATE OF BIRTH:  Feb 24, 1955  DATE OF ADMISSION:  11/04/2017 ADMITTING PHYSICIAN: Adrian Saran, MD  DATE OF DISCHARGE: 11/09/2017 10:22 AM  PRIMARY CARE PHYSICIAN: Cheron Schaumann., MD    ADMISSION DIAGNOSIS:  SOB (shortness of breath) [R06.02] Hypoxia [R09.02] Congestive heart failure, unspecified HF chronicity, unspecified heart failure type (HCC) [I50.9]  DISCHARGE DIAGNOSIS:  Active Problems:   Respiratory failure (HCC)   SECONDARY DIAGNOSIS:   Past Medical History:  Diagnosis Date  . CHF (congestive heart failure) (HCC)   . COPD (chronic obstructive pulmonary disease) (HCC)   . Diabetes mellitus without complication (HCC)   . Hypercholesteremia   . Hypertension   . Seizures [facility])     HOSPITAL COURSE:  62 y/o female with chronic hypoxic respiratoray failure with COPD and chronic diastolic heart failure who presents to ED with worsening SOB over the past month.  *Acute onchronic hypoxichypercapnicrespiratory failure Resolved Due toCAP, acute onchronic diastol;ic heart failure,obstructive sleep apnea,and mild COPD exacerbation Did require short stay in the ICU for BiPAP/CPAP, successfully weaned back to her O2 requirement of 2 L via nasal cannula continuous, will have patient establish care with local primary care provider and pulmonologist per her wishes upon discharge   *CAP, acute Resolving Treated on our pneumonia protocol, provided empiric Rocephin/azithromycin while in house  *Acute on COPD exacerbation, mild Exacerbated by pneumonia Resolved Treated with IV Solu-Medrol, aggressive pulmonary toilet bronchodilator therapy, and patient did well   *Acute on chronicsystolic congestive heart failure exacerbation  Resolved Treated on our CHFprotocol, IV Lasix,echo noted forEF45-50%,strict I&O monitoring, daily weights, and  patient was referred to congestive heart failure clinic on day of discharge   *Acute mildly elevated troponin Inconsistent with acute coronary syndrome Most likely secondary to demand ischemia from respiratory failure Conservative management  *Chronic diabetes mellitus type 2 Stable on her current regiment  *Chronic benign essential hypertension Stable continuedCardizem, Losartan  *Chronic hyperlipidemia, unspecified Continued statin    DISCHARGE CONDITIONS:   stable  CONSULTS OBTAINED:    DRUG ALLERGIES:   Allergies  Allergen Reactions  . Metformin Other (See Comments)    Other reaction(s): Other (See Comments) Constipation, dry mouth, dizziness  . Bismuth Subsalicylate Rash    Pepto Bismol    DISCHARGE MEDICATIONS:   Allergies as of 11/09/2017      Reactions   Metformin Other (See Comments)   Other reaction(s): Other (See Comments) Constipation, dry mouth, dizziness   Bismuth Subsalicylate Rash   Pepto Bismol      Medication List    TAKE these medications   albuterol 108 (90 Base) MCG/ACT inhaler Commonly known as:  PROVENTIL HFA;VENTOLIN HFA Inhale 2 puffs into the lungs 4 (four) times daily as needed for wheezing or shortness of breath.   albuterol (2.5 MG/3ML) 0.083% nebulizer solution Commonly known as:  PROVENTIL Inhale 2.5 mg into the lungs every 6 (six) hours as needed for wheezing or shortness of breath.   atorvastatin 40 MG tablet Commonly known as:  LIPITOR Take 40 mg by mouth daily.   diltiazem 60 MG 12 hr capsule Commonly known as:  CARDIZEM SR Take 60 mg by mouth daily.   ferrous sulfate 325 (65 FE) MG tablet Take 325 mg by mouth daily with breakfast.   furosemide 40 MG tablet Commonly known as:  LASIX Take 2 tablets (80 mg total) by mouth daily. What changed:  how  much to take   glipiZIDE 5 MG tablet Commonly known as:  GLUCOTROL Take 5 mg by mouth 2 (two) times daily before a meal.   GOODSENSE ASPIRIN 325 MG  tablet Generic drug:  aspirin Take 325 mg by mouth daily.   guaiFENesin 600 MG 12 hr tablet Commonly known as:  MUCINEX Take 2 tablets (1,200 mg total) by mouth 2 (two) times daily.   JANUVIA 50 MG tablet Generic drug:  sitaGLIPtin Take 50 mg by mouth daily.   lamoTRIgine 200 MG tablet Commonly known as:  LAMICTAL Take 200 mg by mouth 2 (two) times daily.   LANTUS SOLOSTAR 100 UNIT/ML Solostar Pen Generic drug:  Insulin Glargine Inject 15 Units into the skin daily.   levofloxacin 750 MG tablet Commonly known as:  LEVAQUIN Take 1 tablet (750 mg total) by mouth daily for 7 days.   losartan 50 MG tablet Commonly known as:  COZAAR Take 50 mg by mouth daily.   MULTI-VITAMINS Tabs Take 1 tablet by mouth daily.   niacin 500 MG CR capsule Take 500 mg by mouth daily.   potassium chloride SA 20 MEQ tablet Commonly known as:  K-DUR,KLOR-CON Take 1 tablet (20 mEq total) by mouth daily.   predniSONE 50 MG tablet Commonly known as:  DELTASONE Take 0.5 tablets (25 mg total) by mouth daily with breakfast. What changed:    medication strength  how much to take  when to take this  additional instructions   pregabalin 75 MG capsule Commonly known as:  LYRICA Take 75 mg by mouth 2 (two) times daily.   senna-docusate 8.6-50 MG tablet Commonly known as:  Senokot-S Take 1 tablet by mouth 2 (two) times daily.   SYMBICORT 160-4.5 MCG/ACT inhaler Generic drug:  budesonide-formoterol Inhale 2 puffs into the lungs 2 (two) times daily.   tiotropium 18 MCG inhalation capsule Commonly known as:  SPIRIVA Place 18 mcg into inhaler and inhale daily.   topiramate 200 MG tablet Commonly known as:  TOPAMAX Take 200 mg by mouth 2 (two) times daily.   vitamin B-12 500 MCG tablet Commonly known as:  CYANOCOBALAMIN Take 500 mcg by mouth daily.            Durable Medical Equipment  (From admission, onward)         Start     Ordered   11/08/17 1405  For home use only DME  Walker rolling  Once    Question:  Patient needs a walker to treat with the following condition  Answer:  Weakness   11/08/17 1404           DISCHARGE INSTRUCTIONS:  If you experience worsening of your admission symptoms, develop shortness of breath, life threatening emergency, suicidal or homicidal thoughts you must seek medical attention immediately by calling 911 or calling your MD immediately  if symptoms less severe.  You Must read complete instructions/literature along with all the possible adverse reactions/side effects for all the Medicines you take and that have been prescribed to you. Take any new Medicines after you have completely understood and accept all the possible adverse reactions/side effects.   Please note  You were cared for by a hospitalist during your hospital stay. If you have any questions about your discharge medications or the care you received while you were in the hospital after you are discharged, you can call the unit and asked to speak with the hospitalist on call if the hospitalist that took care of you is not available.  Once you are discharged, your primary care physician will handle any further medical issues. Please note that NO REFILLS for any discharge medications will be authorized once you are discharged, as it is imperative that you return to your primary care physician (or establish a relationship with a primary care physician if you do not have one) for your aftercare needs so that they can reassess your need for medications and monitor your lab values.    Today   CHIEF COMPLAINT:   Chief Complaint  Patient presents with  . Shortness of Breath    HISTORY OF PRESENT ILLNESS:  62 y.o.femalewith a known history of chronic hypoxic respiratory failure on 2 L of oxygen due to COPD and chronic diastolic heart failure, diabetes and hypertension who presents to the emergency room due to shortness of breath.  Patient reports over the past month she  has had increasing shortness of breath, lower extremity edema,increased abdominal girthand wheezing. Over the past 2 days it has become even worse. She denies chest pain however has PND and orthopnea. Upon arrival in the ER patient's O2 saturation on 2 L 82%. She denies fever or chills. She has had a cough and wheezing. She is currently on nonrebreather  VITAL SIGNS:  Blood pressure (!) 148/91, pulse 83, temperature 97.7 F (36.5 C), temperature source Oral, resp. rate 19, height 5\' 4"  (1.626 m), weight 125 kg, SpO2 94 %.  I/O:    Intake/Output Summary (Last 24 hours) at 11/09/2017 1033 Last data filed at 11/09/2017 0823 Gross per 24 hour  Intake 1250 ml  Output 2500 ml  Net -1250 ml    PHYSICAL EXAMINATION:  GENERAL:  62 y.o.-year-old patient lying in the bed with no acute distress.  EYES: Pupils equal, round, reactive to light and accommodation. No scleral icterus. Extraocular muscles intact.  HEENT: Head atraumatic, normocephalic. Oropharynx and nasopharynx clear.  NECK:  Supple, no jugular venous distention. No thyroid enlargement, no tenderness.  LUNGS: Normal breath sounds bilaterally, no wheezing, rales,rhonchi or crepitation. No use of accessory muscles of respiration.  CARDIOVASCULAR: S1, S2 normal. No murmurs, rubs, or gallops.  ABDOMEN: Soft, non-tender, non-distended. Bowel sounds present. No organomegaly or mass.  EXTREMITIES: No pedal edema, cyanosis, or clubbing.  NEUROLOGIC: Cranial nerves II through XII are intact. Muscle strength 5/5 in all extremities. Sensation intact. Gait not checked.  PSYCHIATRIC: The patient is alert and oriented x 3.  SKIN: No obvious rash, lesion, or ulcer.   DATA REVIEW:   CBC Recent Labs  Lab 11/06/17 0729  WBC 4.3  HGB 15.7  HCT 49.8*  PLT 96*    Chemistries  Recent Labs  Lab 11/04/17 1427  11/06/17 0729  11/08/17 0449  NA 138   < > 143   < > 139  K 3.8   < > 3.9   < > 3.7  CL 100   < > 98   < > 92*  CO2 28   < >  34*   < > 38*  GLUCOSE 103*   < > 95   < > 220*  BUN 13   < > 18   < > 26*  CREATININE 1.52*   < > 1.52*   < > 1.56*  CALCIUM 9.1   < > 8.7*   < > 8.5*  MG  --   --  2.3  --   --   AST 24  --   --   --   --   ALT 20  --   --   --   --  ALKPHOS 110  --   --   --   --   BILITOT 1.1  --   --   --   --    < > = values in this interval not displayed.    Cardiac Enzymes Recent Labs  Lab 11/05/17 1236  TROPONINI 0.08*    Microbiology Results  Results for orders placed or performed during the hospital encounter of 11/04/17  MRSA PCR Screening     Status: None   Collection Time: 11/05/17 10:16 AM  Result Value Ref Range Status   MRSA by PCR NEGATIVE NEGATIVE Final    Comment:        The GeneXpert MRSA Assay (FDA approved for NASAL specimens only), is one component of a comprehensive MRSA colonization surveillance program. It is not intended to diagnose MRSA infection nor to guide or monitor treatment for MRSA infections. Performed at [facility], [address], [place], [place] [address]     RADIOLOGY:  No results found.  EKG:   Orders placed or performed during the hospital encounter of 11/04/17  . ED EKG within 10 minutes  . ED EKG within 10 minutes  . EKG 12-Lead  . EKG 12-Lead  . EKG      Management plans discussed with the patient, family and they are in agreement.  CODE STATUS:     Code Status Orders  (From admission, onward)         Start     Ordered   11/04/17 1539  Full code  Continuous     11/04/17 1538        Code Status History    Date Active Date Inactive Code Status Order ID Comments User Context   02/07/2017 0909 02/12/2017 1620 Full Code [id number]  Shaune Pollack, MD Inpatient   11/01/2016 1726 11/04/2016 1447 Full Code [id number]  Gracelyn Nurse, MD Inpatient      TOTAL TIME TAKING CARE OF THIS PATIENT: 45 minutes.    Evelena Asa Salary M.D on 11/09/2017 at 10:33 AM  Between 7am to 6pm - Pager - [phone]  After 6pm  go to [email or web address] - password [email or web address]  [facility]  Office  [phone]  CC: Primary care physician; Cheron Schaumann., MD   Note: This dictation was prepared with Dragon dictation along with smaller phrase technology. Any transcriptional errors that result from this process are unintentional.

## 2017-11-15 ENCOUNTER — Telehealth: Payer: Self-pay

## 2017-11-15 NOTE — Telephone Encounter (Signed)
EMMI Follow-up: Noted on the report that the patient had some other questions.  I called Brenda Wu and left a message for her to call me at her convenience.  Will follow-up again.

## 2017-11-17 ENCOUNTER — Telehealth: Payer: Self-pay

## 2017-11-17 NOTE — Telephone Encounter (Signed)
EMMI Follow-up: 2nd attempt to reach Brenda Wu and left a message with my contact information if she has any concerns.

## 2017-11-23 ENCOUNTER — Emergency Department: Payer: Medicare HMO

## 2017-11-23 ENCOUNTER — Inpatient Hospital Stay
Admission: EM | Admit: 2017-11-23 | Discharge: 2017-11-25 | DRG: 190 | Disposition: A | Discharge status: Home Health | Payer: Medicare HMO | Source: Ambulatory Visit | Attending: Internal Medicine | Admitting: Internal Medicine

## 2017-11-23 ENCOUNTER — Inpatient Hospital Stay: Payer: Medicare HMO | Admitting: Pulmonary Disease

## 2017-11-23 ENCOUNTER — Encounter: Payer: Self-pay | Admitting: Emergency Medicine

## 2017-11-23 DIAGNOSIS — J9602 Acute respiratory failure with hypercapnia: Secondary | ICD-10-CM | POA: Diagnosis not present

## 2017-11-23 DIAGNOSIS — Z833 Family history of diabetes mellitus: Secondary | ICD-10-CM

## 2017-11-23 DIAGNOSIS — Z7952 Long term (current) use of systemic steroids: Secondary | ICD-10-CM

## 2017-11-23 DIAGNOSIS — S0083XA Contusion of other part of head, initial encounter: Secondary | ICD-10-CM | POA: Diagnosis present

## 2017-11-23 DIAGNOSIS — W1839XA Other fall on same level, initial encounter: Secondary | ICD-10-CM | POA: Diagnosis present

## 2017-11-23 DIAGNOSIS — J9601 Acute respiratory failure with hypoxia: Secondary | ICD-10-CM

## 2017-11-23 DIAGNOSIS — J9622 Acute and chronic respiratory failure with hypercapnia: Secondary | ICD-10-CM | POA: Diagnosis present

## 2017-11-23 DIAGNOSIS — G9341 Metabolic encephalopathy: Secondary | ICD-10-CM | POA: Diagnosis present

## 2017-11-23 DIAGNOSIS — I5043 Acute on chronic combined systolic (congestive) and diastolic (congestive) heart failure: Secondary | ICD-10-CM | POA: Diagnosis present

## 2017-11-23 DIAGNOSIS — Z7982 Long term (current) use of aspirin: Secondary | ICD-10-CM

## 2017-11-23 DIAGNOSIS — Z8249 Family history of ischemic heart disease and other diseases of the circulatory system: Secondary | ICD-10-CM

## 2017-11-23 DIAGNOSIS — J441 Chronic obstructive pulmonary disease with (acute) exacerbation: Secondary | ICD-10-CM | POA: Diagnosis present

## 2017-11-23 DIAGNOSIS — I130 Hypertensive heart and chronic kidney disease with heart failure and stage 1 through stage 4 chronic kidney disease, or unspecified chronic kidney disease: Secondary | ICD-10-CM | POA: Diagnosis present

## 2017-11-23 DIAGNOSIS — E1122 Type 2 diabetes mellitus with diabetic chronic kidney disease: Secondary | ICD-10-CM | POA: Diagnosis present

## 2017-11-23 DIAGNOSIS — J9621 Acute and chronic respiratory failure with hypoxia: Secondary | ICD-10-CM

## 2017-11-23 DIAGNOSIS — Z96659 Presence of unspecified artificial knee joint: Secondary | ICD-10-CM | POA: Diagnosis present

## 2017-11-23 DIAGNOSIS — Z9981 Dependence on supplemental oxygen: Secondary | ICD-10-CM

## 2017-11-23 DIAGNOSIS — Z87891 Personal history of nicotine dependence: Secondary | ICD-10-CM | POA: Diagnosis not present

## 2017-11-23 DIAGNOSIS — R55 Syncope and collapse: Secondary | ICD-10-CM | POA: Diagnosis present

## 2017-11-23 DIAGNOSIS — Z6841 Body Mass Index (BMI) 40.0 and over, adult: Secondary | ICD-10-CM | POA: Diagnosis not present

## 2017-11-23 DIAGNOSIS — E785 Hyperlipidemia, unspecified: Secondary | ICD-10-CM | POA: Diagnosis present

## 2017-11-23 DIAGNOSIS — Y92481 Parking lot as the place of occurrence of the external cause: Secondary | ICD-10-CM

## 2017-11-23 DIAGNOSIS — N183 Chronic kidney disease, stage 3 (moderate): Secondary | ICD-10-CM | POA: Diagnosis present

## 2017-11-23 DIAGNOSIS — E7800 Pure hypercholesterolemia, unspecified: Secondary | ICD-10-CM | POA: Diagnosis present

## 2017-11-23 DIAGNOSIS — Z825 Family history of asthma and other chronic lower respiratory diseases: Secondary | ICD-10-CM | POA: Diagnosis not present

## 2017-11-23 DIAGNOSIS — D696 Thrombocytopenia, unspecified: Secondary | ICD-10-CM | POA: Diagnosis present

## 2017-11-23 DIAGNOSIS — E872 Acidosis: Secondary | ICD-10-CM | POA: Diagnosis present

## 2017-11-23 DIAGNOSIS — Z7951 Long term (current) use of inhaled steroids: Secondary | ICD-10-CM

## 2017-11-23 DIAGNOSIS — Z794 Long term (current) use of insulin: Secondary | ICD-10-CM

## 2017-11-23 LAB — MRSA PCR SCREENING: MRSA by PCR: NEGATIVE

## 2017-11-23 LAB — BLOOD GAS, VENOUS
ACID-BASE EXCESS: 7.5 mmol/L — AB (ref 0.0–2.0)
Bicarbonate: 39 mmol/L — ABNORMAL HIGH (ref 20.0–28.0)
Patient temperature: 37
pCO2, Ven: 87 mmHg (ref 44.0–60.0)
pH, Ven: 7.26 (ref 7.250–7.430)

## 2017-11-23 LAB — CBC
HEMATOCRIT: 49.8 % — AB (ref 35.0–47.0)
HEMOGLOBIN: 16.1 g/dL — AB (ref 12.0–16.0)
MCH: 31.6 pg (ref 26.0–34.0)
MCHC: 32.3 g/dL (ref 32.0–36.0)
MCV: 97.8 fL (ref 80.0–100.0)
Platelets: 61 10*3/uL — ABNORMAL LOW (ref 150–440)
RBC: 5.1 MIL/uL (ref 3.80–5.20)
RDW: 17 % — ABNORMAL HIGH (ref 11.5–14.5)
WBC: 7 10*3/uL (ref 3.6–11.0)

## 2017-11-23 LAB — COMPREHENSIVE METABOLIC PANEL
ALBUMIN: 4 g/dL (ref 3.5–5.0)
ALK PHOS: 119 U/L (ref 38–126)
ALT: 40 U/L (ref 0–44)
ANION GAP: 9 (ref 5–15)
AST: 33 U/L (ref 15–41)
BUN: 17 mg/dL (ref 8–23)
CALCIUM: 8.9 mg/dL (ref 8.9–10.3)
CHLORIDE: 102 mmol/L (ref 98–111)
CO2: 34 mmol/L — AB (ref 22–32)
Creatinine, Ser: 1.27 mg/dL — ABNORMAL HIGH (ref 0.44–1.00)
GFR calc non Af Amer: 45 mL/min — ABNORMAL LOW (ref >60)
GFR, EST AFRICAN AMERICAN: 52 mL/min — AB (ref >60)
GLUCOSE: 113 mg/dL — AB (ref 70–99)
POTASSIUM: 3.8 mmol/L (ref 3.5–5.1)
SODIUM: 145 mmol/L (ref 135–145)
Total Bilirubin: 1 mg/dL (ref 0.3–1.2)
Total Protein: 6.6 g/dL (ref 6.5–8.1)

## 2017-11-23 LAB — GLUCOSE, CAPILLARY
GLUCOSE-CAPILLARY: 93 mg/dL (ref 70–99)
Glucose-Capillary: 104 mg/dL — ABNORMAL HIGH (ref 70–99)
Glucose-Capillary: 364 mg/dL — ABNORMAL HIGH (ref 70–99)
Glucose-Capillary: 80 mg/dL (ref 70–99)

## 2017-11-23 LAB — BRAIN NATRIURETIC PEPTIDE: B NATRIURETIC PEPTIDE 5: 480 pg/mL — AB (ref 0.0–100.0)

## 2017-11-23 LAB — PROCALCITONIN: Procalcitonin: <0.1 ng/mL

## 2017-11-23 MED ORDER — METHYLPREDNISOLONE SODIUM SUCC 125 MG IJ SOLR: 60.0000 mg | Freq: Two times a day (BID) | INTRAMUSCULAR | Status: DC

## 2017-11-23 MED ORDER — SODIUM CHLORIDE 0.9% FLUSH
3.0000 mL | Freq: Two times a day (BID) | INTRAVENOUS | Status: DC
  Administered 2017-11-23 – 2017-11-25 (×4): 3 mL via INTRAVENOUS

## 2017-11-23 MED ORDER — ACETAMINOPHEN 650 MG RE SUPP: 650.0000 mg | Freq: Four times a day (QID) | RECTAL | Status: DC | PRN

## 2017-11-23 MED ORDER — ALBUTEROL SULFATE (2.5 MG/3ML) 0.083% IN NEBU: 2.5000 mg | INHALATION_SOLUTION | RESPIRATORY_TRACT | Status: DC | PRN

## 2017-11-23 MED ORDER — PREGABALIN 75 MG PO CAPS
75.0000 mg | ORAL_CAPSULE | Freq: Two times a day (BID) | ORAL | Status: DC
  Administered 2017-11-23 – 2017-11-25 (×4): 75 mg via ORAL
  Filled 2017-11-23 (×4): qty 1

## 2017-11-23 MED ORDER — METHYLPREDNISOLONE SODIUM SUCC 40 MG IJ SOLR
40.0000 mg | Freq: Two times a day (BID) | INTRAMUSCULAR | Status: DC
  Administered 2017-11-24 – 2017-11-25 (×3): 40 mg via INTRAVENOUS
  Filled 2017-11-23 (×3): qty 1

## 2017-11-23 MED ORDER — FUROSEMIDE 10 MG/ML IJ SOLN
60.0000 mg | Freq: Two times a day (BID) | INTRAMUSCULAR | Status: DC
  Administered 2017-11-23: 60 mg via INTRAVENOUS
  Filled 2017-11-23: qty 8

## 2017-11-23 MED ORDER — METHYLPREDNISOLONE SODIUM SUCC 125 MG IJ SOLR
125.0000 mg | Freq: Once | INTRAMUSCULAR | Status: AC
  Administered 2017-11-23: 125 mg via INTRAVENOUS
  Filled 2017-11-23: qty 2

## 2017-11-23 MED ORDER — DILTIAZEM HCL ER 60 MG PO CP12
60.0000 mg | ORAL_CAPSULE | Freq: Two times a day (BID) | ORAL | Status: DC
  Administered 2017-11-23 – 2017-11-25 (×3): 60 mg via ORAL
  Filled 2017-11-23 (×6): qty 1

## 2017-11-23 MED ORDER — HYDRALAZINE HCL 20 MG/ML IJ SOLN: 10.0000 mg | Freq: Four times a day (QID) | INTRAMUSCULAR | Status: DC | PRN

## 2017-11-23 MED ORDER — INSULIN GLARGINE 100 UNIT/ML ~~LOC~~ SOLN
15.0000 [IU] | Freq: Every day | SUBCUTANEOUS | Status: DC
  Administered 2017-11-24: 15 [IU] via SUBCUTANEOUS
  Filled 2017-11-23 (×3): qty 0.15

## 2017-11-23 MED ORDER — LAMOTRIGINE 100 MG PO TABS
200.0000 mg | ORAL_TABLET | Freq: Two times a day (BID) | ORAL | Status: DC
  Administered 2017-11-23 – 2017-11-25 (×4): 200 mg via ORAL
  Filled 2017-11-23: qty 2
  Filled 2017-11-23 (×2): qty 8
  Filled 2017-11-23: qty 2

## 2017-11-23 MED ORDER — ACETAZOLAMIDE SODIUM 500 MG IJ SOLR
250.0000 mg | Freq: Two times a day (BID) | INTRAMUSCULAR | Status: DC
  Administered 2017-11-23 – 2017-11-24 (×2): 250 mg via INTRAVENOUS
  Filled 2017-11-23 (×3): qty 250

## 2017-11-23 MED ORDER — POLYETHYLENE GLYCOL 3350 17 G PO PACK: 17.0000 g | PACK | Freq: Every day | ORAL | Status: DC | PRN

## 2017-11-23 MED ORDER — TOPIRAMATE 100 MG PO TABS
200.0000 mg | ORAL_TABLET | Freq: Two times a day (BID) | ORAL | Status: DC
  Administered 2017-11-23 – 2017-11-25 (×4): 200 mg via ORAL
  Filled 2017-11-23 (×6): qty 2

## 2017-11-23 MED ORDER — IPRATROPIUM-ALBUTEROL 0.5-2.5 (3) MG/3ML IN SOLN
3.0000 mL | Freq: Once | RESPIRATORY_TRACT | Status: AC
  Administered 2017-11-23: 3 mL via RESPIRATORY_TRACT
  Filled 2017-11-23: qty 3

## 2017-11-23 MED ORDER — ENOXAPARIN SODIUM 40 MG/0.4ML ~~LOC~~ SOLN: 40.0000 mg | SUBCUTANEOUS | Status: DC

## 2017-11-23 MED ORDER — ATORVASTATIN CALCIUM 20 MG PO TABS
40.0000 mg | ORAL_TABLET | Freq: Every day | ORAL | Status: DC
  Administered 2017-11-24: 40 mg via ORAL
  Filled 2017-11-23 (×2): qty 2

## 2017-11-23 MED ORDER — POTASSIUM CHLORIDE CRYS ER 20 MEQ PO TBCR
40.0000 meq | EXTENDED_RELEASE_TABLET | Freq: Once | ORAL | Status: DC
  Filled 2017-11-23: qty 2

## 2017-11-23 MED ORDER — ASPIRIN 325 MG PO TABS
325.0000 mg | ORAL_TABLET | Freq: Every day | ORAL | Status: DC
  Administered 2017-11-24 – 2017-11-25 (×2): 325 mg via ORAL
  Filled 2017-11-23 (×2): qty 1

## 2017-11-23 MED ORDER — IPRATROPIUM-ALBUTEROL 0.5-2.5 (3) MG/3ML IN SOLN
3.0000 mL | RESPIRATORY_TRACT | Status: DC
  Administered 2017-11-23 – 2017-11-25 (×10): 3 mL via RESPIRATORY_TRACT
  Filled 2017-11-23 (×10): qty 3

## 2017-11-23 MED ORDER — IPRATROPIUM-ALBUTEROL 0.5-2.5 (3) MG/3ML IN SOLN: 3.0000 mL | Freq: Four times a day (QID) | RESPIRATORY_TRACT | Status: DC

## 2017-11-23 MED ORDER — INSULIN ASPART 100 UNIT/ML ~~LOC~~ SOLN: 0.0000 [IU] | SUBCUTANEOUS | Status: DC

## 2017-11-23 MED ORDER — ONDANSETRON HCL 4 MG PO TABS: 4.0000 mg | ORAL_TABLET | Freq: Four times a day (QID) | ORAL | Status: DC | PRN

## 2017-11-23 MED ORDER — INSULIN GLARGINE 100 UNIT/ML ~~LOC~~ SOLN
5.0000 [IU] | Freq: Every day | SUBCUTANEOUS | Status: DC
  Filled 2017-11-23 (×2): qty 0.05

## 2017-11-23 MED ORDER — BUDESONIDE 0.5 MG/2ML IN SUSP
0.5000 mg | Freq: Two times a day (BID) | RESPIRATORY_TRACT | Status: DC
  Administered 2017-11-23 – 2017-11-25 (×4): 0.5 mg via RESPIRATORY_TRACT
  Filled 2017-11-23 (×5): qty 2

## 2017-11-23 MED ORDER — ACETAMINOPHEN 325 MG PO TABS: 650.0000 mg | ORAL_TABLET | Freq: Four times a day (QID) | ORAL | Status: DC | PRN

## 2017-11-23 MED ORDER — ONDANSETRON HCL 4 MG/2ML IJ SOLN: 4.0000 mg | Freq: Four times a day (QID) | INTRAMUSCULAR | Status: DC | PRN

## 2017-11-23 MED ORDER — INSULIN ASPART 100 UNIT/ML ~~LOC~~ SOLN
0.0000 [IU] | SUBCUTANEOUS | Status: DC
  Administered 2017-11-23: 20 [IU] via SUBCUTANEOUS
  Administered 2017-11-24 (×2): 7 [IU] via SUBCUTANEOUS
  Administered 2017-11-24: 11 [IU] via SUBCUTANEOUS
  Administered 2017-11-24: 4 [IU] via SUBCUTANEOUS
  Administered 2017-11-24 – 2017-11-25 (×2): 7 [IU] via SUBCUTANEOUS
  Administered 2017-11-25: 10:00:00 4 [IU] via SUBCUTANEOUS
  Administered 2017-11-25: 04:00:00 7 [IU] via SUBCUTANEOUS
  Filled 2017-11-23 (×9): qty 1

## 2017-11-23 NOTE — Progress Notes (Deleted)
PULMONARY POST HOSPITAL FOLLOW UP NOTE   PT PROFILE: 62 y.o. female initially met in hospital when she was admitted 11/01/16 with acute on chronic respiratory failure due to AECOPD requiring noninvasive ventilation.  Initially diagnosed with COPD in 2004 [place], [place]). Quit smoking in 2004. On oxygen therapy since 2013. Also has a diagnosis of obstructive sleep apnea. At baseline, has class III/IV dyspnea.   DATA:  08/01/15 LDCT: mild diffuse bronchial wall thickening with moderate centrilobular and mild paraseptal emphysema. Imaging findings suggestive of underlying COPD. No suspicious nodules or masses. CTA chest 02/11/17: No pulmonary embolism.  Moderate emphysema. Dependent lower lobe atelectasis with associated bronchial wall thickening, likely due to chronic bronchial inflammation 08/01/17 echocardiogram [facility]): LV ejection fraction = 55-60%. Right ventricle is severely dilated. The right ventricular systolic function is normal. The right atrium is mildly dilated. There is mild tricuspid regurgitation. Mild to moderate pulmonary hypertension 11/05/17 echocardiogram: LVEF 45-50%.  Left atrium mildly dilated.  Right ventricle moderately dilated.  Right atrium mildly dilated.  RVSP estimate within the normal range.  INTERVAL: Last visit October 2018.  Hospitalized December 2018 with COPD exacerbation.  Did not have follow-up in this office after that hospitalization.  Hospitalized last month with acute/chronic respiratory failure due to COPD and diastolic heart failure.  SUBJ:  As above. She was discharged home on a course of antibiotics and prednisone. He is maintained on Symbicort, Spiriva and albuterol rescue inhaler. She states that she uses her albuterol up to 8 or 9 times per day. She believes that she is approximately 75% back to her baseline.   She has never had PFTs performed in this community.  There were no vitals filed for this visit.  EXAM:  Gen: obese, No overt respiratory  distress HEENT: NCAT, sclera white, oropharynx normal Neck: Supple without LAN, thyromegaly, JVP cannot be visualized Lungs: breath sounds diminished throughout without wheezes or other adventitious sounds Cardiovascular: distant at bedtime, regular, no murmurs noted Abdomen: obese, soft, nontender, normal BS Ext: without clubbing, cyanosis, edema Neuro: CNs grossly intact, motor and sensory intact Skin: Limited exam, no lesions noted  DATA:   BMP Latest Ref Rng & Units 11/08/2017 11/07/2017 11/06/2017  Glucose 70 - 99 mg/dL 220(H) 131(H) 95  BUN 8 - 23 mg/dL 26(H) 22 18  Creatinine 0.44 - 1.00 mg/dL 1.56(H) 1.43(H) 1.52(H)  Sodium 135 - 145 mmol/L 139 141 143  Potassium 3.5 - 5.1 mmol/L 3.7 3.6 3.9  Chloride 98 - 111 mmol/L 92(L) 97(L) 98  CO2 22 - 32 mmol/L 38(H) 38(H) 34(H)  Calcium 8.9 - 10.3 mg/dL 8.5(L) 8.7(L) 8.7(L)    CBC Latest Ref Rng & Units 11/06/2017 11/05/2017 11/04/2017  WBC 3.6 - 11.0 K/uL 4.3 4.8 5.2  Hemoglobin 12.0 - 16.0 g/dL 15.7 16.4(H) 16.7(H)  Hematocrit 35.0 - 47.0 % 49.8(H) 52.1(H) 53.4(H)  Platelets 150 - 440 K/uL 96(L) 98(L) 92(L)    CXR (11/07/17): Cardiomegaly, pulmonary artery is enlarged, probable right pleural effusion, probable bibasilar atelectasis  IMPRESSION:   No diagnosis found.   PLAN:  Continue Symbicort, Spiriva, albuterol as currently prescribed Continue oxygen therapy as currently using Continue CPAP at night PFTs ordered Referral to pulmonary rehabilitation program Add azithromycin 250 mg daily for its anti-inflammatory effect Follow-up in 3 months   Merton Border, MD PCCM service Mobile [phone] Pager [phone] 11/23/2017 9:08 AM

## 2017-11-23 NOTE — ED Notes (Signed)
Spoke with pt about wait times and what to expect next. Advised pt that I am available for further questions if needed.  

## 2017-11-23 NOTE — ED Notes (Signed)
Nurse tried to call report and was told "room is not approved." Nurse was unable to determine what that means. Nurse to call back

## 2017-11-23 NOTE — ED Notes (Signed)
PCo2 of 87 reported to Dr. Roxan Hockey

## 2017-11-23 NOTE — ED Notes (Signed)
Respiratory called

## 2017-11-23 NOTE — Consult Note (Signed)
Name: Brenda Wu MRN: [id number] DOB: [birth year]     CONSULTATION DATE: 11/23/2017  REFERRING MD :  sudini  CHIEF COMPLAINT:  resp failure  STUDIES:  CXR 11/23/2017 I have Independently reviewed images of  CXR  on 11/23/2017 Interpretation: b/l interstitial infiltrates     HISTORY OF PRESENT ILLNESS:  62 yo Morbidly obese AAF admitted to ICU for severe resp failure On biPAP Severe resp acidosis Has h/o COPD Has h/o CHF Patient given lasix in ER Bicarb levels on chem 7 is 34 pro calcitonin pending  Patient was at doctors Office visit and was lethargic, fell in parking lot Bought to ER pco2 increased 87, placed on biPAP Patient very lethargic  CT head no acute process  High risk for intubation Critically ill   PAST MEDICAL HISTORY :   has a past medical history of CHF (congestive heart failure) (HCC), COPD (chronic obstructive pulmonary disease) (HCC), Diabetes mellitus without complication (HCC), Hypercholesteremia, Hypertension, and Seizures (HCC).  has a past surgical history that includes Replacement total knee. Prior to Admission medications   Medication Sig Start Date End Date Taking? Authorizing Provider  albuterol (PROVENTIL HFA;VENTOLIN HFA) 108 (90 Base) MCG/ACT inhaler Inhale 2 puffs into the lungs 4 (four) times daily as needed for wheezing or shortness of breath.  01/05/16   [provider]  albuterol (PROVENTIL) (2.5 MG/3ML) 0.083% nebulizer solution Inhale 2.5 mg into the lungs every 6 (six) hours as needed for wheezing or shortness of breath.  10/01/16   [provider]  aspirin (GOODSENSE ASPIRIN) 325 MG tablet Take 325 mg by mouth daily. 01/31/08   [provider]  atorvastatin (LIPITOR) 40 MG tablet Take 40 mg by mouth daily.    [provider]  budesonide-formoterol (SYMBICORT) 160-4.5 MCG/ACT inhaler Inhale 2 puffs into the lungs 2 (two) times daily. 05/22/15   [provider]  diltiazem (CARDIZEM SR)  60 MG 12 hr capsule Take 60 mg by mouth daily.     [provider]  ferrous sulfate 325 (65 FE) MG tablet Take 325 mg by mouth daily with breakfast.    [provider]  furosemide (LASIX) 40 MG tablet Take 2 tablets (80 mg total) by mouth daily. 11/08/17 11/08/18  Salary, Evelena Asa, MD  glipiZIDE (GLUCOTROL) 5 MG tablet Take 5 mg by mouth 2 (two) times daily before a meal.     [provider]  Insulin Glargine (LANTUS SOLOSTAR) 100 UNIT/ML Solostar Pen Inject 15 Units into the skin daily. 01/21/17   [provider]  lamoTRIgine (LAMICTAL) 200 MG tablet Take 200 mg by mouth 2 (two) times daily.     [provider]  losartan (COZAAR) 50 MG tablet Take 50 mg by mouth daily.    [provider]  Multiple Vitamin (MULTI-VITAMINS) TABS Take 1 tablet by mouth daily.    [provider]  niacin 500 MG CR capsule Take 500 mg by mouth daily. 08/15/15   [provider]  potassium chloride SA (K-DUR,KLOR-CON) 20 MEQ tablet Take 1 tablet (20 mEq total) by mouth daily. Patient not taking: Reported on 11/04/2017 02/12/17   [name], MD  predniSONE (DELTASONE) 50 MG tablet Take 0.5 tablets (25 mg total) by mouth daily with breakfast. 11/08/17   Salary, Evelena Asa, MD  pregabalin (LYRICA) 75 MG capsule Take 75 mg by mouth 2 (two) times daily. 01/11/17 04/11/17  [provider]  sitaGLIPtin (JANUVIA) 50 MG tablet Take 50 mg by mouth daily.    [provider]  tiotropium (SPIRIVA) 18 MCG inhalation capsule Place 18 mcg into inhaler and inhale daily.    [provider]  topiramate (TOPAMAX) 200 MG tablet Take 200 mg by mouth 2 (two) times daily.    [provider]  vitamin B-12 (CYANOCOBALAMIN) 500 MCG tablet Take 500 mcg by mouth daily.    [provider]   Allergies  Allergen Reactions  . Metformin Other (See Comments)    Other reaction(s): Other (See Comments) Constipation, dry mouth, dizziness    . Bismuth Subsalicylate Rash    Pepto Bismol    FAMILY HISTORY:  family history includes Asthma in her sister; Diabetes in her sister; Heart murmur in her sister; Hypertension in her father. SOCIAL HISTORY:  reports that she has quit smoking. Her smoking use included cigarettes. She has a 30.00 pack-year smoking history. She has never used smokeless tobacco. She reports that she does not drink alcohol or use drugs.  REVIEW OF SYSTEMS:   Unable to obtain due to critical illness   VITAL SIGNS: Temp:  [98.6 F (37 C)] 98.6 F (37 C) (10/02 1115) Pulse Rate:  [74-86] 76 (10/02 1732) Resp:  [11-28] 14 (10/02 1732) BP: (119-136)/(68-84) 133/84 (10/02 1732) SpO2:  [74 %-97 %] 97 % (10/02 1732) Weight:  [110.7 kg-112.2 kg] 112.2 kg (10/02 1732)  Physical Examination:  GENERAL:critically ill appearing, +resp distress HEAD: Normocephalic, atraumatic.  EYES: Pupils equal, round, reactive to light.  No scleral icterus.  MOUTH: Moist mucosal membrane. NECK: Supple. No JVD.  PULMONARY: +rhonchi, +wheezing CARDIOVASCULAR: S1 and S2. Regular rate and rhythm. No murmurs, rubs, or gallops.  GASTROINTESTINAL: Soft, nontender, -distended. No masses. Positive bowel sounds. No hepatosplenomegaly.  MUSCULOSKELETAL: No swelling, clubbing, or edema.  NEUROLOGIC: lethargic SKIN:intact,warm,dry  I personally reviewed lab work that was obtained in last 24 hrs.    ASSESSMENT / PLAN: Severe Hypoxic and Hypercapnic Respiratory Failure BiPAP as needed -continue Bronchodilator Therapy -Wean Fio2    SEVERE COPD EXACERBATION -continue IV steroids as prescribed -continue NEB THERAPY as prescribed -morphine as needed -wean fio2 as needed and tolerated  CARDIAC FAILURE-sCHF -oxygen as needed -diamox as tolerated -follow up cardiac enzymes as indicated  KEEP NPO for now  DVT/GI PRX ordered-plt count low, has SCD's  TRANSFUSIONS AS NEEDED MONITOR FSBS ASSESS the need for LABS as  needed    Critical Care Time devoted to patient care services described in this note is 39 minutes.   Overall, patient is critically ill, prognosis is guarded.  Patient with Multiorgan failure and at high risk for cardiac arrest and death.    Lucie Leather, M.D.  [facility]  Medical Director [facility] Medical Director [facility] Cardio-Pulmonary Department

## 2017-11-23 NOTE — ED Provider Notes (Signed)
[facility] Emergency Department Provider Note    First MD Initiated Contact with Patient 11/23/17 1311     (approximate)  I have reviewed the triage vital signs and the nursing notes.   HISTORY  Chief Complaint Fall and Altered Mental Status  LeveL V cAveat:  Respiratory distress  HPI Brenda Wu is a 62 y.o. female patient arrives to the ER reportedly after  going to pulmonology clinic for outpatient follow-up after hospitalization.  Was reported on the way back to her car and had syncopal episode falling hitting her head and hand.  According to staff patient was more confused.  Does report she is on blood thinners but is very slow to respond.  Does appear to be in respiratory distress with significant hypoxia even on her home supplemental oxygen with SPO2 going down to 70% with good waveform.  Respiratory called emergently and patient started on BiPAP for acute respiratory failure.   Past Medical History:  Diagnosis Date  . CHF (congestive heart failure) (HCC)   . COPD (chronic obstructive pulmonary disease) (HCC)   . Diabetes mellitus without complication (HCC)   . Hypercholesteremia   . Hypertension   . Seizures (HCC)    Family History  Problem Relation Age of Onset  . Hypertension Father   . Asthma Sister   . Heart murmur Sister   . Diabetes Sister    Past Surgical History:  Procedure Laterality Date  . REPLACEMENT TOTAL KNEE     Patient Active Problem List   Diagnosis Date Noted  . Respiratory failure (HCC) 11/04/2017  . COPD exacerbation (HCC) 02/07/2017  . Acute on chronic respiratory failure with hypoxia and hypercapnia (HCC) 11/01/2016  . COPD with acute exacerbation (HCC) 03/12/2016  . History of cerebellar stroke 03/12/2016  . History of seizures 03/12/2016  . Pulmonary nodule, left 01/05/2016  . Paroxysmal atrial fibrillation (HCC) 10/17/2015  . Cerebellar stroke (HCC) 06/26/2015  . Transient diplopia 06/09/2015  .  Asthma-chronic obstructive pulmonary disease overlap syndrome (HCC) 05/30/2015  . COPD with asthma (HCC) 05/30/2015  . Hypoxemia 05/30/2015  . Obstructive sleep apnea syndrome 05/30/2015  . OSA on CPAP 05/30/2015  . Obesity (BMI 30-39.9) 03/12/2015  . Bilateral hearing loss 01/22/2015  . Generalized seizure disorder (HCC) 01/22/2015  . Hyperlipidemia associated with type 2 diabetes mellitus (HCC) 01/22/2015  . Hypertension associated with diabetes (HCC) 01/22/2015  . Morbid obesity due to excess calories (HCC) 01/22/2015  . Primary osteoarthritis involving multiple joints 01/22/2015  . Severe episode of recurrent major depressive disorder, without psychotic features (HCC) 01/22/2015  . Type 2 diabetes mellitus without complication, without long-term current use of insulin (HCC) 01/22/2015      Prior to Admission medications   Medication Sig Start Date End Date Taking? Authorizing Provider  albuterol (PROVENTIL HFA;VENTOLIN HFA) 108 (90 Base) MCG/ACT inhaler Inhale 2 puffs into the lungs 4 (four) times daily as needed for wheezing or shortness of breath.  01/05/16   [provider]  albuterol (PROVENTIL) (2.5 MG/3ML) 0.083% nebulizer solution Inhale 2.5 mg into the lungs every 6 (six) hours as needed for wheezing or shortness of breath.  10/01/16   [provider]  aspirin (GOODSENSE ASPIRIN) 325 MG tablet Take 325 mg by mouth daily. 01/31/08   [provider]  atorvastatin (LIPITOR) 40 MG tablet Take 40 mg by mouth daily.    [provider]  budesonide-formoterol (SYMBICORT) 160-4.5 MCG/ACT inhaler Inhale 2 puffs into the lungs 2 (two) times daily. 05/22/15  [provider]  diltiazem (CARDIZEM SR) 60 MG 12 hr capsule Take 60 mg by mouth daily.     [provider]  ferrous sulfate 325 (65 FE) MG tablet Take 325 mg by mouth daily with breakfast.    [provider]  furosemide (LASIX) 40 MG tablet Take 2 tablets (80 mg total) by  mouth daily. 11/08/17 11/08/18  Salary, Evelena Asa, MD  glipiZIDE (GLUCOTROL) 5 MG tablet Take 5 mg by mouth 2 (two) times daily before a meal.     [provider]  Insulin Glargine (LANTUS SOLOSTAR) 100 UNIT/ML Solostar Pen Inject 15 Units into the skin daily. 01/21/17   [provider]  lamoTRIgine (LAMICTAL) 200 MG tablet Take 200 mg by mouth 2 (two) times daily.     [provider]  losartan (COZAAR) 50 MG tablet Take 50 mg by mouth daily.    [provider]  Multiple Vitamin (MULTI-VITAMINS) TABS Take 1 tablet by mouth daily.    [provider]  niacin 500 MG CR capsule Take 500 mg by mouth daily. 08/15/15   [provider]  potassium chloride SA (K-DUR,KLOR-CON) 20 MEQ tablet Take 1 tablet (20 mEq total) by mouth daily. Patient not taking: Reported on 11/04/2017 02/12/17   [name], MD  predniSONE (DELTASONE) 50 MG tablet Take 0.5 tablets (25 mg total) by mouth daily with breakfast. 11/08/17   Salary, Evelena Asa, MD  pregabalin (LYRICA) 75 MG capsule Take 75 mg by mouth 2 (two) times daily. 01/11/17 04/11/17  [provider]  sitaGLIPtin (JANUVIA) 50 MG tablet Take 50 mg by mouth daily.    [provider]  tiotropium (SPIRIVA) 18 MCG inhalation capsule Place 18 mcg into inhaler and inhale daily.    [provider]  topiramate (TOPAMAX) 200 MG tablet Take 200 mg by mouth 2 (two) times daily.    [provider]  vitamin B-12 (CYANOCOBALAMIN) 500 MCG tablet Take 500 mcg by mouth daily.    [provider]    Allergies Metformin and Bismuth subsalicylate    Social History Social History   Tobacco Use  . Smoking status: Former Smoker    Packs/day: 1.00    Years: 30.00    Pack years: 30.00    Types: Cigarettes  . Smokeless tobacco: Never Used  Substance Use Topics  . Alcohol use: No  . Drug use: No    Review of Systems Patient denies headaches, rhinorrhea, blurry vision, numbness,  shortness of breath, chest pain, edema, cough, abdominal pain, nausea, vomiting, diarrhea, dysuria, fevers, rashes or hallucinations unless otherwise stated above in HPI. ____________________________________________   PHYSICAL EXAM:  VITAL SIGNS: Vitals:   11/23/17 1339 11/23/17 1400  BP: 131/77 132/83  Pulse: 81 76  Resp: 16 11  Temp:    SpO2: 94% 93%    Constitutional: Alert but drowsy and slow to answer questions  Eyes: Conjunctivae are normal.  Head: contusion to right forehead Nose: No congestion/rhinnorhea. Mouth/Throat: Mucous membranes are moist.   Neck: No stridor. Painless ROM.  Cardiovascular: Normal rate, regular rhythm. Grossly normal heart sounds.  Good peripheral circulation. Respiratory: tachypnea with diminished BS throughout Gastrointestinal: Soft and nontender. No distention. No abdominal bruits. No CVA tenderness. Genitourinary:  Musculoskeletal: No lower extremity tenderness nor edema.  No joint effusions. Neurologic:   No gross focal neurologic deficits are appreciated. No facial droop Skin:  Skin is warm, dry and intact. No rash noted. Psychiatric: Mood and affect are normal. Speech and behavior are  normal.  ____________________________________________   LABS (all labs ordered are listed, but only abnormal results are displayed)  Results for orders placed or performed during the hospital encounter of 11/23/17 (from the past 24 hour(s))  Glucose, capillary     Status: None   Collection Time: 11/23/17 11:23 AM  Result Value Ref Range   Glucose-Capillary 93 70 - 99 mg/dL  Comprehensive metabolic panel     Status: Abnormal   Collection Time: 11/23/17 11:26 AM  Result Value Ref Range   Sodium 145 135 - 145 mmol/L   Potassium 3.8 3.5 - 5.1 mmol/L   Chloride 102 98 - 111 mmol/L   CO2 34 (H) 22 - 32 mmol/L   Glucose, Bld 113 (H) 70 - 99 mg/dL   BUN 17 8 - 23 mg/dL   Creatinine, Ser 6.96 (H) 0.44 - 1.00 mg/dL   Calcium 8.9 8.9 - 29.5 mg/dL   Total  Protein 6.6 6.5 - 8.1 g/dL   Albumin 4.0 3.5 - 5.0 g/dL   AST 33 15 - 41 U/L   ALT 40 0 - 44 U/L   Alkaline Phosphatase 119 38 - 126 U/L   Total Bilirubin 1.0 0.3 - 1.2 mg/dL   GFR calc non Af Amer 45 (L) >60 mL/min   GFR calc Af Amer 52 (L) >60 mL/min   Anion gap 9 5 - 15  CBC     Status: Abnormal   Collection Time: 11/23/17 11:26 AM  Result Value Ref Range   WBC 7.0 3.6 - 11.0 K/uL   RBC 5.10 3.80 - 5.20 MIL/uL   Hemoglobin 16.1 (H) 12.0 - 16.0 g/dL   HCT 28.4 (H) 13.2 - 44.0 %   MCV 97.8 80.0 - 100.0 fL   MCH 31.6 26.0 - 34.0 pg   MCHC 32.3 32.0 - 36.0 g/dL   RDW 10.2 (H) 72.5 - 36.6 %   Platelets 61 (L) 150 - 440 K/uL  Brain natriuretic peptide     Status: Abnormal   Collection Time: 11/23/17 11:26 AM  Result Value Ref Range   B Natriuretic Peptide 480.0 (H) 0.0 - 100.0 pg/mL  Blood gas, venous     Status: Abnormal   Collection Time: 11/23/17  1:37 PM  Result Value Ref Range   pH, Ven 7.26 7.250 - 7.430   pCO2, Ven 87 (HH) 44.0 - 60.0 mmHg   pO2, Ven <31.0 (LL) 32.0 - 45.0 mmHg   Bicarbonate 39.0 (H) 20.0 - 28.0 mmol/L   Acid-Base Excess 7.5 (H) 0.0 - 2.0 mmol/L   Patient temperature 37.0    Collection site VEIN    Sample type VENOUS    ____________________________________________  EKG My review and personal interpretation at Time: 11:23   Indication: sob  Rate: 80  Rhythm: sinus  Axis: normal Other: normal intervals, no stemi ____________________________________________  RADIOLOGY  I personally reviewed all radiographic images ordered to evaluate for the above acute complaints and reviewed radiology reports and findings.  These findings were personally discussed with the patient.  Please see medical record for radiology report.  ____________________________________________   PROCEDURES  Procedure(s) performed:  .Critical Care Performed by: Willy Eddy, MD Authorized by: Willy Eddy, MD   Critical care provider statement:    Critical care  time (minutes):  51   Critical care time was exclusive of:  Separately billable procedures and treating other patients   Critical care was necessary to treat or prevent imminent or life-threatening deterioration of the following conditions:  Respiratory failure  Critical care was time spent personally by me on the following activities:  Development of treatment plan with patient or surrogate, discussions with consultants, evaluation of patient's response to treatment, examination of patient, obtaining history from patient or surrogate, ordering and performing treatments and interventions, ordering and review of laboratory studies, ordering and review of radiographic studies, pulse oximetry, re-evaluation of patient's condition and review of old charts      Critical Care performed: yes ____________________________________________   INITIAL IMPRESSION / ASSESSMENT AND PLAN / ED COURSE  Pertinent labs & imaging results that were available during my care of the patient were reviewed by me and considered in my medical decision making (see chart for details).   DDX: Asthma, copd, CHF, pna, ptx, malignancy, Pe, anemia   Brenda Wu is a 62 y.o. who presents to the ED with symptoms as described above.  Patient placed immediately on BiPAP for respiratory distress.  Do suspect some component of hypercapnia CO2 narcosis causing confusion.  CT imaging will be ordered to evaluate for traumatic injury.  The patient will be placed on continuous pulse oximetry and telemetry for monitoring.  Laboratory evaluation will be sent to evaluate for the above complaints.     Clinical Course as of Nov 23 1505  Wed Nov 23, 2017  1358 ABG does show significant hypercapnia.  Do feel that patient's confusion is related to this.  However given unreliable exam syncopal episode with fall and head injury will order CT imaging to evaluate for TBI.   [PR]  1444 Oxygenation respiratory rate improving on BiPAP.   [PR]      Clinical Course User Index [PR] Willy Eddy, MD     As part of my medical decision making, I reviewed the following data within the electronic MEDICAL RECORD NUMBER Nursing notes reviewed and incorporated, Labs reviewed, notes from prior ED visits and [place] Controlled Substance Database   ____________________________________________   FINAL CLINICAL IMPRESSION(S) / ED DIAGNOSES  Final diagnoses:  Acute on chronic respiratory failure with hypoxia and hypercapnia (HCC)      NEW MEDICATIONS STARTED DURING THIS VISIT:  New Prescriptions   No medications on file     Note:  This document was prepared using Dragon voice recognition software and may include unintentional dictation errors.    Willy Eddy, MD 11/23/17 [phone]

## 2017-11-23 NOTE — ED Triage Notes (Signed)
Patient presents to the ED from the pulmonology clinic.  Patient had a post hospitalization appointment today but was late and then clinic staff states patient seemed very confused.  On the way back to her car, patient fell and hit her face and right leg.  Hospital staff brought patient back to pulmonary clinic which then brought patient to the ED for evaluation.  Patient is alert and oriented x 4 at this time but does seem somewhat confused, answering questions vaguely.

## 2017-11-23 NOTE — H&P (Addendum)
[facility] - [place] at [facility]   PATIENT NAME: Brenda Wu    MR#:  [id number]  DATE OF BIRTH:  [birth year]  DATE OF ADMISSION:  11/23/2017  PRIMARY CARE PHYSICIAN: Cheron Schaumann., MD   REQUESTING/REFERRING PHYSICIAN: Dr. Roxan Hockey  CHIEF COMPLAINT:   Chief Complaint  Patient presents with  . Fall  . Altered Mental Status    HISTORY OF PRESENT ILLNESS:  Brenda Wu  is a 62 y.o. female with a known history of COPD, mixed diastolic and systolic CHF, hypertension, diabetes mellitus presented to the emergency room sent in from her pulmonologist office after patient was found to be drowsy and had a fall with syncope.  Patient arrived in the emergency room and was was found to have COPD exacerbation and chest x-ray showing pulmonary edema.  ABG was checked and pH of 7.26 and PCO2 87.  Patient is drowsy and was placed on BiPAP.  Being admitted to ICU.  Patient unable to contribute much to history.  PAST MEDICAL HISTORY:   Past Medical History:  Diagnosis Date  . CHF (congestive heart failure) (HCC)   . COPD (chronic obstructive pulmonary disease) (HCC)   . Diabetes mellitus without complication (HCC)   . Hypercholesteremia   . Hypertension   . Seizures (HCC)     PAST SURGICAL HISTORY:   Past Surgical History:  Procedure Laterality Date  . REPLACEMENT TOTAL KNEE      SOCIAL HISTORY:   Social History   Tobacco Use  . Smoking status: Former Smoker    Packs/day: 1.00    Years: 30.00    Pack years: 30.00    Types: Cigarettes  . Smokeless tobacco: Never Used  Substance Use Topics  . Alcohol use: No    FAMILY HISTORY:   Family History  Problem Relation Age of Onset  . Hypertension Father   . Asthma Sister   . Heart murmur Sister   . Diabetes Sister     DRUG ALLERGIES:   Allergies  Allergen Reactions  . Metformin Other (See Comments)    Other reaction(s): Other (See Comments) Constipation, dry mouth, dizziness  . Bismuth  Subsalicylate Rash    Pepto Bismol    REVIEW OF SYSTEMS:   Review of Systems  Unable to perform ROS: Mental status change    MEDICATIONS AT HOME:   Prior to Admission medications   Medication Sig Start Date End Date Taking? Authorizing Provider  albuterol (PROVENTIL HFA;VENTOLIN HFA) 108 (90 Base) MCG/ACT inhaler Inhale 2 puffs into the lungs 4 (four) times daily as needed for wheezing or shortness of breath.  01/05/16   [provider]  albuterol (PROVENTIL) (2.5 MG/3ML) 0.083% nebulizer solution Inhale 2.5 mg into the lungs every 6 (six) hours as needed for wheezing or shortness of breath.  10/01/16   [provider]  aspirin (GOODSENSE ASPIRIN) 325 MG tablet Take 325 mg by mouth daily. 01/31/08   [provider]  atorvastatin (LIPITOR) 40 MG tablet Take 40 mg by mouth daily.    [provider]  budesonide-formoterol (SYMBICORT) 160-4.5 MCG/ACT inhaler Inhale 2 puffs into the lungs 2 (two) times daily. 05/22/15   [provider]  diltiazem (CARDIZEM SR) 60 MG 12 hr capsule Take 60 mg by mouth daily.     [provider]  ferrous sulfate 325 (65 FE) MG tablet Take 325 mg by mouth daily with breakfast.    [provider]  furosemide (LASIX) 40 MG tablet Take 2 tablets (80 mg  total) by mouth daily. 11/08/17 11/08/18  Salary, Evelena Asa, MD  glipiZIDE (GLUCOTROL) 5 MG tablet Take 5 mg by mouth 2 (two) times daily before a meal.     [provider]  Insulin Glargine (LANTUS SOLOSTAR) 100 UNIT/ML Solostar Pen Inject 15 Units into the skin daily. 01/21/17   [provider]  lamoTRIgine (LAMICTAL) 200 MG tablet Take 200 mg by mouth 2 (two) times daily.     [provider]  losartan (COZAAR) 50 MG tablet Take 50 mg by mouth daily.    [provider]  Multiple Vitamin (MULTI-VITAMINS) TABS Take 1 tablet by mouth daily.    [provider]  niacin 500 MG CR capsule Take 500 mg by mouth daily. 08/15/15    [provider]  potassium chloride SA (K-DUR,KLOR-CON) 20 MEQ tablet Take 1 tablet (20 mEq total) by mouth daily. Patient not taking: Reported on 11/04/2017 02/12/17   [name], MD  predniSONE (DELTASONE) 50 MG tablet Take 0.5 tablets (25 mg total) by mouth daily with breakfast. 11/08/17   Salary, Evelena Asa, MD  pregabalin (LYRICA) 75 MG capsule Take 75 mg by mouth 2 (two) times daily. 01/11/17 04/11/17  [provider]  sitaGLIPtin (JANUVIA) 50 MG tablet Take 50 mg by mouth daily.    [provider]  tiotropium (SPIRIVA) 18 MCG inhalation capsule Place 18 mcg into inhaler and inhale daily.    [provider]  topiramate (TOPAMAX) 200 MG tablet Take 200 mg by mouth 2 (two) times daily.    [provider]  vitamin B-12 (CYANOCOBALAMIN) 500 MCG tablet Take 500 mcg by mouth daily.    [provider]     VITAL SIGNS:  Blood pressure 132/83, pulse 76, temperature 98.6 F (37 C), temperature source Oral, resp. rate 11, height 5\' 4"  (1.626 m), weight 110.7 kg, SpO2 93 %.  PHYSICAL EXAMINATION:  Physical Exam  GENERAL:  62 y.o.-year-old patient lying in the bed .  Obese.  Critically ill. EYES: Pupils equal, round, reactive to light and accommodation. No scleral icterus. Extraocular muscles intact.  HEENT: Head atraumatic, normocephalic. Oropharynx and nasopharynx clear. No oropharyngeal erythema, moist oral mucosa  NECK:  Supple, no jugular venous distention. No thyroid enlargement, no tenderness.  LUNGS: Decreased air entry bilaterally with wheezing CARDIOVASCULAR: S1, S2 normal. No murmurs, rubs, or gallops.  ABDOMEN: Soft, nontender, nondistended. Bowel sounds present. No organomegaly or mass.  EXTREMITIES: No pedal edema, cyanosis, or clubbing. + 2 pedal & radial pulses b/l.   NEUROLOGIC: Not following instructions PSYCHIATRIC: The patient is drowsy.  Opens eyes on calling her name SKIN: No obvious rash, lesion, or ulcer.    LABORATORY PANEL:   CBC Recent Labs  Lab 11/23/17 1126  WBC 7.0  HGB 16.1*  HCT 49.8*  PLT 61*   ------------------------------------------------------------------------------------------------------------------  Chemistries  Recent Labs  Lab 11/23/17 1126  NA 145  K 3.8  CL 102  CO2 34*  GLUCOSE 113*  BUN 17  CREATININE 1.27*  CALCIUM 8.9  AST 33  ALT 40  ALKPHOS 119  BILITOT 1.0   ------------------------------------------------------------------------------------------------------------------  Cardiac Enzymes No results for input(s): TROPONINI in the last 168 hours. ------------------------------------------------------------------------------------------------------------------  RADIOLOGY:  Ct Head Wo Contrast  Result Date: 11/23/2017 CLINICAL DATA:  Minor head trauma with fall. EXAM: CT HEAD WITHOUT CONTRAST TECHNIQUE: Contiguous axial images were obtained from the base of the skull through the vertex without intravenous contrast. COMPARISON:  Brain MRI 06/11/2015 FINDINGS: Brain: No evidence of acute infarction,  hemorrhage, hydrocephalus, extra-axial collection or mass lesion/mass effect. Partially empty sella, also seen in 2017, nonspecific in isolation. Vascular: Mild arterial calcification. Skull: No acute finding Sinuses/Orbits: Negative IMPRESSION: No acute finding. Electronically Signed   By: Marnee Spring M.D.   On: 11/23/2017 14:45   Dg Chest Portable 1 View  Result Date: 11/23/2017 CLINICAL DATA:  Fall, edema, confusion, history hypertension, CHF, COPD, diabetes mellitus, former smoker EXAM: PORTABLE CHEST 1 VIEW COMPARISON:  Portable exam 1327 hours compared to 11/07/2017 FINDINGS: Enlargement of cardiac silhouette. Mediastinal contours and pulmonary vascularity normal. Minimal subsegmental atelectasis at lingula. Improved pulmonary edema. No pleural effusion or pneumothorax. Bones unremarkable. IMPRESSION: Improved pulmonary edema with minimal  residual subsegmental atelectasis at lingula. Enlargement of cardiac silhouette Electronically Signed   By: Ulyses Southward M.D.   On: 11/23/2017 13:55   Dg Hand Complete Right  Result Date: 11/23/2017 CLINICAL DATA:  Hand pain post fall question fracture EXAM: RIGHT HAND - COMPLETE 3+ VIEW COMPARISON:  None FINDINGS: Osseous mineralization normal. Joint spaces preserved. No acute fracture, dislocation, or bone destruction. Question dorsal soft tissue swelling at the distal forearm. IMPRESSION: No acute osseous abnormalities. Electronically Signed   By: Ulyses Southward M.D.   On: 11/23/2017 13:56     IMPRESSION AND PLAN:   *Acute on chronic hypoxic and hypercapnic respiratory failure secondary to COPD and CHF.  Placed BiPAP.  Admit to ICU.  Discussed with intensivist Dr. Jeralene Huff. Wean off BiPAP as tolerated to nasal cannula.  *Acute COPD exacerbation. - IV steroids - Scheduled Nebulizers - Inhalers - Wean bipap - Consult pulmonary if no improvement  * Acute on chronic systolic/diastolic chf - IV Lasix 60 mg BID IV - Input and Output - Counseled to limit fluids and Salt - Monitor Bun/Cr and Potassium - Echo - reviewed -Cardiology follow up after discharge  *Acute metabolic encephalopathy due to hypercapnia.  Should improve on BiPAP.  *Syncope likely due to hypoxia and hypercapnia.  CT scan of the head was normal. Cardiac monitoring added.  *Thrombocytopenia.  Stable from last admission. No bleeding.  All the records are reviewed and case discussed with ED provider. Management plans discussed with the patient, family and they are in agreement.  CODE STATUS: Full code  TOTAL CC TIME TAKING CARE OF THIS PATIENT: 80 minutes.   Molinda Bailiff Andri Prestia M.D on 11/23/2017 at 3:07 PM  Between 7am to 6pm - Pager - [phone]  After 6pm go to [email or web address] - password [email or web address] [facility]  [facility]  Office  [phone]  CC: Primary care physician; Cheron Schaumann., MD  Note:  This dictation was prepared with Dragon dictation along with smaller phrase technology. Any transcriptional errors that result from this process are unintentional.

## 2017-11-23 NOTE — Progress Notes (Signed)
Pharmacy Electrolyte Monitoring Consult:  Pharmacy consulted to assist in monitoring and replacing electrolytes in this 62 y.o. female admitted on 11/23/2017 with Fall and Altered Mental Status   Labs:  Sodium (mmol/L)  Date Value  11/23/2017 145   Potassium (mmol/L)  Date Value  11/23/2017 3.8   Magnesium (mg/dL)  Date Value  62/83/6629 2.3   Phosphorus (mg/dL)  Date Value  47/65/4650 4.1   Calcium (mg/dL)  Date Value  35/46/5681 8.9   Albumin (g/dL)  Date Value  27/51/7001 4.0    Assessment/Plan: Patient received furosemide 60 mg IV x 1. Acetazolamide 250 mg IV q12h ordered to start at 1800. Will give potassium 40 mEq PO x 1.   Will check electrolytes with morning labs.  Pharmacy to follow and replace electrolytes per consult.  Pricilla Riffle, PharmD Pharmacy Resident  11/23/2017 5:48 PM

## 2017-11-24 LAB — BASIC METABOLIC PANEL
Anion gap: 6 (ref 5–15)
BUN: 26 mg/dL — ABNORMAL HIGH (ref 8–23)
CHLORIDE: 103 mmol/L (ref 98–111)
CO2: 35 mmol/L — ABNORMAL HIGH (ref 22–32)
CREATININE: 1.31 mg/dL — AB (ref 0.44–1.00)
Calcium: 8.5 mg/dL — ABNORMAL LOW (ref 8.9–10.3)
GFR calc Af Amer: 50 mL/min — ABNORMAL LOW (ref >60)
GFR calc non Af Amer: 43 mL/min — ABNORMAL LOW (ref >60)
Glucose, Bld: 281 mg/dL — ABNORMAL HIGH (ref 70–99)
Potassium: 4.2 mmol/L (ref 3.5–5.1)
Sodium: 144 mmol/L (ref 135–145)

## 2017-11-24 LAB — PHOSPHORUS: Phosphorus: 4.7 mg/dL — ABNORMAL HIGH (ref 2.5–4.6)

## 2017-11-24 LAB — CBC
HCT: 48.4 % — ABNORMAL HIGH (ref 35.0–47.0)
Hemoglobin: 15.6 g/dL (ref 12.0–16.0)
MCH: 30.8 pg (ref 26.0–34.0)
MCHC: 32.3 g/dL (ref 32.0–36.0)
MCV: 95.4 fL (ref 80.0–100.0)
PLATELETS: 63 10*3/uL — AB (ref 150–440)
RBC: 5.07 MIL/uL (ref 3.80–5.20)
RDW: 16.6 % — ABNORMAL HIGH (ref 11.5–14.5)
WBC: 5.1 10*3/uL (ref 3.6–11.0)

## 2017-11-24 LAB — GLUCOSE, CAPILLARY
GLUCOSE-CAPILLARY: 283 mg/dL — AB (ref 70–99)
GLUCOSE-CAPILLARY: 223 mg/dL — AB (ref 70–99)
GLUCOSE-CAPILLARY: 242 mg/dL — AB (ref 70–99)
GLUCOSE-CAPILLARY: 248 mg/dL — AB (ref 70–99)
Glucose-Capillary: 151 mg/dL — ABNORMAL HIGH (ref 70–99)

## 2017-11-24 LAB — MAGNESIUM: Magnesium: 2.6 mg/dL — ABNORMAL HIGH (ref 1.7–2.4)

## 2017-11-24 MED ORDER — INSULIN GLARGINE 100 UNIT/ML ~~LOC~~ SOLN
15.0000 [IU] | Freq: Once | SUBCUTANEOUS | Status: AC
  Administered 2017-11-24: 15 [IU] via SUBCUTANEOUS
  Filled 2017-11-24: qty 0.15

## 2017-11-24 NOTE — Progress Notes (Signed)
   CHIEF COMPLAINT:   Follow up COPD exacerbation   Subjective  SOB resolved COPD exacerbation improving Off biPAP on minimal oxygen Wants to leave  Transfer to gen med floor     Objective   VITALS: BP 99/65   Pulse 76   Temp 98.4 F (36.9 C)   Resp 11   Ht 5\' 4"  (1.626 m)   Wt 112.2 kg   SpO2 94%   BMI 42.46 kg/m    Review of Systems:  Gen:  Denies  fever, sweats, chills weigh loss  HEENT: Denies blurred vision, double vision, ear pain, eye pain, hearing loss, nose bleeds, sore throat Cardiac:  No dizziness, chest pain or heaviness, chest tightness,edema, No JVD Resp:   +SOB no wheezing Gi: Denies swallowing difficulty, stomach pain, nausea or vomiting, diarrhea, constipation, bowel incontinence Gu:  Denies bladder incontinence, burning urine Ext:   Denies Joint pain, stiffness or swelling Skin: Denies  skin rash, easy bruising or bleeding or hives Endoc:  Denies polyuria, polydipsia , polyphagia or weight change Psych:   Denies depression, insomnia or hallucinations  Other:  All other systems negative  Physical Examination:   GENERAL:NAD, no fevers, chills, no weakness no fatigue HEAD: Normocephalic, atraumatic.  EYES: Pupils equal, round, reactive to light. Extraocular muscles intact. No scleral icterus.  MOUTH: Moist mucosal membrane. Dentition intact. No abscess noted.  EAR, NOSE, THROAT: Clear without exudates. No external lesions.  NECK: Supple. No thyromegaly. No nodules. No JVD.  PULMONARY: CTA B/L no wheezing, rhonchi, crackles CARDIOVASCULAR: S1 and S2. Regular rate and rhythm. No murmurs, rubs, or gallops. No edema. Pedal pulses 2+ bilaterally.  GASTROINTESTINAL: Soft, nontender, nondistended. No masses. Positive bowel sounds. No hepatosplenomegaly.  MUSCULOSKELETAL: No swelling, clubbing, or edema. Range of motion full in all extremities.  NEUROLOGIC: Cranial nerves II through XII are intact. No gross focal neurological deficits. Sensation intact.  Reflexes intact.  SKIN: No ulceration, lesions, rashes, or cyanosis. Skin warm and dry. Turgor intact.  PSYCHIATRIC: Mood, affect within normal limits. The patient is awake, alert and oriented x 3. Insight, judgment intact.  ALL OTHER ROS ARE NEGATIVE   I personally reviewed Labs under Results section.   Admitted for COPD and CHF exacerbation-slowly improving  Continue diamox Oxygen as needed  SEVERE COPD EXACERBATION-slowly improving -continue IV steroids as prescribed -continue NEB THERAPY as prescribed -morphine as needed -wean fio2 as needed and tolerated    Transfer to gen med floor  Alessia Gonsalez Santiago Glad, M.D.  [facility]  Medical Director [facility] Medical Director [facility] Cardio-Pulmonary Department

## 2017-11-24 NOTE — Progress Notes (Signed)
Pharmacy Electrolyte Monitoring Consult:  Pharmacy consulted to assist in monitoring and replacing electrolytes in this 62 y.o. female admitted on 11/23/2017 with Fall and Altered Mental Status   Labs:  Sodium (mmol/L)  Date Value  11/24/2017 144   Potassium (mmol/L)  Date Value  11/24/2017 4.2   Magnesium (mg/dL)  Date Value  97/41/6384 2.6 (H)   Phosphorus (mg/dL)  Date Value  53/64/6803 4.7 (H)   Calcium (mg/dL)  Date Value  21/22/4825 8.5 (L)   Albumin (g/dL)  Date Value  00/37/0488 4.0    Assessment/Plan:  Acetazolamide discontinued this morning on rounds. Electrolytes are WNL. No supplementations required.   Will check electrolytes with morning labs.  Pharmacy to follow and replace electrolytes per consult.  Broadus John, PharmD Candidate 11/24/2017 11:31 AM

## 2017-11-24 NOTE — Progress Notes (Signed)
Pt. Refuses BIPAP at this time. 

## 2017-11-24 NOTE — Progress Notes (Signed)
Patient ID: Brenda Wu, female   DOB: Oct 19, 1955, 62 y.o.   MRN: [id number]  [employer] PROGRESS NOTE  Khamari Yousuf WUJ:[id number] DOB: [birth year] DOA: 11/23/2017 PCP: Cheron Schaumann., MD  HPI/Subjective: Patient was seen while she was still in the ICU.  She was feeling better when I saw her.  No complaints of weight gain.  Some weight loss.  States she is breathing better.  Normally she wears 2 L of oxygen.  She was on 4 L when I saw her.  No cough shortness of breath or wheezing that she complains of at this time.  Objective: Vitals:   11/24/17 1100 11/24/17 1114  BP: 115/67   Pulse: 82   Resp:    Temp:    SpO2: 91% 94%    Filed Weights   11/23/17 1116 11/23/17 1732  Weight: 110.7 kg 112.2 kg    ROS: Review of Systems  Constitutional: Negative for chills and fever.  Eyes: Negative for blurred vision.  Respiratory: Negative for cough and shortness of breath.   Cardiovascular: Negative for chest pain.  Gastrointestinal: Negative for abdominal pain, constipation, diarrhea, nausea and vomiting.  Genitourinary: Negative for dysuria.  Musculoskeletal: Negative for joint pain.  Neurological: Negative for dizziness and headaches.   Exam: Physical Exam  Constitutional: She is oriented to person, place, and time.  HENT:  Nose: No mucosal edema.  Mouth/Throat: No oropharyngeal exudate or posterior oropharyngeal edema.  Eyes: Pupils are equal, round, and reactive to light. Conjunctivae, EOM and lids are normal.  Neck: No JVD present. Carotid bruit is not present. No edema present. No thyroid mass and no thyromegaly present.  Cardiovascular: S1 normal and S2 normal. Exam reveals no gallop.  No murmur heard. Pulses:      Dorsalis pedis pulses are 2+ on the right side, and 2+ on the left side.  Respiratory: No respiratory distress. She has decreased breath sounds in the right lower field and the left lower field. She has wheezes in the right middle field and the  left middle field. She has no rhonchi. She has no rales.  GI: Soft. Bowel sounds are normal. There is no tenderness.  Musculoskeletal:       Right ankle: She exhibits swelling.       Left ankle: She exhibits swelling.  Lymphadenopathy:    She has no cervical adenopathy.  Neurological: She is alert and oriented to person, place, and time. No cranial nerve deficit.  Skin: Skin is warm. No rash noted. Nails show no clubbing.  Psychiatric: She has a normal mood and affect.      Data Reviewed: Basic Metabolic Panel: Recent Labs  Lab 11/23/17 1126 11/24/17 0421  NA 145 144  K 3.8 4.2  CL 102 103  CO2 34* 35*  GLUCOSE 113* 281*  BUN 17 26*  CREATININE 1.27* 1.31*  CALCIUM 8.9 8.5*  MG  --  2.6*  PHOS  --  4.7*   Liver Function Tests: Recent Labs  Lab 11/23/17 1126  AST 33  ALT 40  ALKPHOS 119  BILITOT 1.0  PROT 6.6  ALBUMIN 4.0   CBC: Recent Labs  Lab 11/23/17 1126 11/24/17 0421  WBC 7.0 5.1  HGB 16.1* 15.6  HCT 49.8* 48.4*  MCV 97.8 95.4  PLT 61* 63*   BNP (last 3 results) Recent Labs    02/07/17 0441 11/04/17 1656 11/23/17 1126  BNP 429.0* 909.0* 480.0*     CBG: Recent Labs  Lab 11/23/17 1936 11/23/17 2334 11/24/17 0358  11/24/17 0709 11/24/17 1249  GLUCAP 104* 364* 283* 151* 223*    Recent Results (from the past 240 hour(s))  MRSA PCR Screening     Status: None   Collection Time: 11/23/17  5:49 PM  Result Value Ref Range Status   MRSA by PCR NEGATIVE NEGATIVE Final    Comment:        The GeneXpert MRSA Assay (FDA approved for NASAL specimens only), is one component of a comprehensive MRSA colonization surveillance program. It is not intended to diagnose MRSA infection nor to guide or monitor treatment for MRSA infections. Performed at [facility], [address], [place], [place] [address]      Studies: Ct Head Wo Contrast  Result Date: 11/23/2017 CLINICAL DATA:  Minor head trauma with fall. EXAM: CT HEAD WITHOUT  CONTRAST TECHNIQUE: Contiguous axial images were obtained from the base of the skull through the vertex without intravenous contrast. COMPARISON:  Brain MRI 06/11/2015 FINDINGS: Brain: No evidence of acute infarction, hemorrhage, hydrocephalus, extra-axial collection or mass lesion/mass effect. Partially empty sella, also seen in 2017, nonspecific in isolation. Vascular: Mild arterial calcification. Skull: No acute finding Sinuses/Orbits: Negative IMPRESSION: No acute finding. Electronically Signed   By: Marnee Spring M.D.   On: 11/23/2017 14:45   Dg Chest Portable 1 View  Result Date: 11/23/2017 CLINICAL DATA:  Fall, edema, confusion, history hypertension, CHF, COPD, diabetes mellitus, former smoker EXAM: PORTABLE CHEST 1 VIEW COMPARISON:  Portable exam 1327 hours compared to 11/07/2017 FINDINGS: Enlargement of cardiac silhouette. Mediastinal contours and pulmonary vascularity normal. Minimal subsegmental atelectasis at lingula. Improved pulmonary edema. No pleural effusion or pneumothorax. Bones unremarkable. IMPRESSION: Improved pulmonary edema with minimal residual subsegmental atelectasis at lingula. Enlargement of cardiac silhouette Electronically Signed   By: Ulyses Southward M.D.   On: 11/23/2017 13:55   Dg Hand Complete Right  Result Date: 11/23/2017 CLINICAL DATA:  Hand pain post fall question fracture EXAM: RIGHT HAND - COMPLETE 3+ VIEW COMPARISON:  None FINDINGS: Osseous mineralization normal. Joint spaces preserved. No acute fracture, dislocation, or bone destruction. Question dorsal soft tissue swelling at the distal forearm. IMPRESSION: No acute osseous abnormalities. Electronically Signed   By: Ulyses Southward M.D.   On: 11/23/2017 13:56    Scheduled Meds: . aspirin  325 mg Oral Daily  . atorvastatin  40 mg Oral q1800  . budesonide (PULMICORT) nebulizer solution  0.5 mg Nebulization BID  . diltiazem  60 mg Oral Q12H  . insulin aspart  0-20 Units Subcutaneous Q4H  . insulin glargine  15  Units Subcutaneous QHS  . ipratropium-albuterol  3 mL Nebulization Q4H  . lamoTRIgine  200 mg Oral BID  . methylPREDNISolone (SOLU-MEDROL) injection  40 mg Intravenous Q12H  . potassium chloride  40 mEq Oral Once  . pregabalin  75 mg Oral BID  . sodium chloride flush  3 mL Intravenous Q12H  . topiramate  200 mg Oral BID   Continuous Infusions:  Assessment/Plan:  1. Acute on chronic hypoxic hypercarbic respiratory failure.  Care manager consult to see if she is a candidate for noninvasive ventilation at night.  Patient has a CPAP which would not work for this. 2. COPD exacerbation on Solu-Medrol and nebulizer treatments. 3. Chronic combined systolic and diastolic congestive heart failure.  Patient given IV Lasix on presentation but BUN and creatinine slightly elevated so was held the rest of today.  Potentially can restart tomorrow.  No beta-blocker with bronchospasm 4. Type 2 diabetes mellitus on glargine insulin and sliding  scale 5. History of seizures on Topamax and lamotrigine 6. Hyperlipidemia unspecified on atorvastatin 7. Hypertension on Cardizem 8. Physical therapy evaluation  Code Status:     Code Status Orders  (From admission, onward)         Start     Ordered   11/23/17 1506  Full code  Continuous     11/23/17 1506        Code Status History    Date Active Date Inactive Code Status Order ID Comments User Context   11/04/2017 1538 11/09/2017 1335 Full Code [id number]  Adrian Saran, MD ED   02/07/2017 0909 02/12/2017 1620 Full Code [id number]  Shaune Pollack, MD Inpatient   11/01/2016 1726 11/04/2016 1447 Full Code [id number]  Gracelyn Nurse, MD Inpatient     Disposition Plan: Patient wanted to go home.  Will need to see if the patient is a candidate for BiPAP at night  Consultants:  Seen by critical care specialist  Time spent: 28 minutes   Kemisha Bonnette [employer]

## 2017-11-24 NOTE — Progress Notes (Signed)
Dr Belia Heman gave order for PT eval due to patient fall before admission

## 2017-11-24 NOTE — Evaluation (Signed)
Physical Therapy Evaluation Patient Details Name: Brenda Wu MRN: [id number] DOB: 09-04-1955 Today's Date: 11/24/2017   History of Present Illness  Pt is a 62 y.o. female presenting to hospital 11/23/17 after falling outside (pt had appt at pulmonary clinic) and noted with confusion.  Pt placed on Bipap.  Pt admitted with acute on chronic hypoxic and hypercapnic respiratory failure secondary COPD and CHF; also acute metabolic encephalopathy d/t hypercapnia.  PMH includes CHF, COPD, DM, htn, seizures, TKR, h/o stroke, transient diplopia, and B hearing loss.  Clinical Impression  Prior to hospital admission, pt was ambulatory no AD; home O2 2L except uses 3 L when ambulating.  Pt lives with her husband in 1 level home (no steps to enter).  Currently pt is modified independent semi-supine to sit; SBA with transfers; and CGA ambulating 80 feet with no AD.  No loss of balance during session's activities.  Pt's O2 decreased from 92% to 83% post ambulation (on 3 L O2 via nasal cannula) but increased back up to 92% with pursed lip breathing (with O2 decreased back to 2 L by end of session): nurse notified.  Pt would benefit from skilled PT to address noted impairments and functional limitations (see below for any additional details).  Upon hospital discharge, recommend pt discharge to home with HHPT.    Follow Up Recommendations Home health PT    Equipment Recommendations  None recommended by PT    Recommendations for Other Services       Precautions / Restrictions Precautions Precautions: Fall Restrictions Weight Bearing Restrictions: No      Mobility  Bed Mobility Overal bed mobility: Modified Independent Bed Mobility: Supine to Sit     Supine to sit: Modified independent (Device/Increase time)     General bed mobility comments: mild increased effort to perform on own  Transfers Overall transfer level: Needs assistance Equipment used: None Transfers: Sit to/from Stand Sit to  Stand: Supervision         General transfer comment: increased effort to stand noted from bed and from recliner  Ambulation/Gait Ambulation/Gait assistance: Min guard Gait Distance (Feet): 80 Feet Assistive device: None   Gait velocity: decreased   General Gait Details: mild decreased B step length; mild increased B lateral sway but no loss of balance  Stairs            Wheelchair Mobility    Modified Rankin (Stroke Patients Only)       Balance Overall balance assessment: Needs assistance Sitting-balance support: No upper extremity supported;Feet supported Sitting balance-Leahy Scale: Normal Sitting balance - Comments: steady sitting reaching outside BOS   Standing balance support: No upper extremity supported Standing balance-Leahy Scale: Good Standing balance comment: steady standing reaching within BOS                             Pertinent Vitals/Pain Pain Assessment: No/denies pain  HR stable and WFL throughout treatment session.    Home Living Family/patient expects to be discharged to:: Private residence Living Arrangements: Spouse/significant other Available Help at Discharge: Family Type of Home: House Home Access: Level entry     Home Layout: One level Home Equipment: Environmental consultant - 2 wheels      Prior Function Level of Independence: Independent with assistive device(s)         Comments: Pt uses 2 L O2 via nasal cannula at home (increases to 3 L/min O2 for ambulation/activity)     Hand Dominance  Extremity/Trunk Assessment   Upper Extremity Assessment Upper Extremity Assessment: Generalized weakness    Lower Extremity Assessment Lower Extremity Assessment: Generalized weakness    Cervical / Trunk Assessment Cervical / Trunk Assessment: Normal  Communication   Communication: No difficulties  Cognition Arousal/Alertness: Awake/alert Behavior During Therapy: WFL for tasks assessed/performed Overall Cognitive  Status: Within Functional Limits for tasks assessed                                        General Comments  Pt agreeable to PT session.    Exercises     Assessment/Plan    PT Assessment Patient needs continued PT services  PT Problem List Decreased strength;Decreased activity tolerance;Decreased balance;Decreased mobility;Cardiopulmonary status limiting activity       PT Treatment Interventions DME instruction;Functional mobility training;Balance training;Patient/family education;Therapeutic activities;Gait training;Therapeutic exercise    PT Goals (Current goals can be found in the Care Plan section)  Acute Rehab PT Goals Patient Stated Goal: to go home PT Goal Formulation: With patient Time For Goal Achievement: 12/08/17 Potential to Achieve Goals: Good    Frequency Min 2X/week   Barriers to discharge        Co-evaluation               AM-PAC PT "6 Clicks" Daily Activity  Outcome Measure Difficulty turning over in bed (including adjusting bedclothes, sheets and blankets)?: None Difficulty moving from lying on back to sitting on the side of the bed? : A Little Difficulty sitting down on and standing up from a chair with arms (e.g., wheelchair, bedside commode, etc,.)?: Unable Help needed moving to and from a bed to chair (including a wheelchair)?: A Little Help needed walking in hospital room?: A Little Help needed climbing 3-5 steps with a railing? : A Little 6 Click Score: 17    End of Session Equipment Utilized During Treatment: Gait belt;Oxygen Activity Tolerance: Other (comment)(Limited d/t O2 desaturation with activity) Patient left: in chair;with call bell/phone within reach;with chair alarm set Nurse Communication: Mobility status;Precautions PT Visit Diagnosis: Muscle weakness (generalized) (M62.81);Other abnormalities of gait and mobility (R26.89);Difficulty in walking, not elsewhere classified (R26.2)    Time: 1350-1420 PT Time  Calculation (min) (ACUTE ONLY): 30 min   Charges:   PT Evaluation $PT Eval Low Complexity: 1 Low PT Treatments $Therapeutic Activity: 8-22 mins       Hendricks Limes, PT 11/24/17, 2:51 PM [phone]

## 2017-11-24 NOTE — Progress Notes (Signed)
[facility]  Patient Status: Active  [facility] is providing the following services: SN, PT  Notes: Requirements sent to [facility] and [facility] for NIV qualification.   If patient discharges after hours, please call [phone].   Brenda Wu 11/24/2017, 4:47 PM

## 2017-11-24 NOTE — Progress Notes (Signed)
Reported called to receiving nurse, Tiffany with no questions.  Patient updated. Belonging's packed and ready to go.

## 2017-11-24 NOTE — Progress Notes (Signed)
Patient tolerating nasal cannula well. Stated earlier in my shift that her breathing was fine and she did not need to use the bipap.  Per Dr Belia Heman last note, bipap as needed so it remains on standby at this time. Will continue to monitor.

## 2017-11-25 ENCOUNTER — Ambulatory Visit: Payer: Medicare HMO | Admitting: Family

## 2017-11-25 LAB — BASIC METABOLIC PANEL
Anion gap: 7 (ref 5–15)
BUN: 26 mg/dL — ABNORMAL HIGH (ref 8–23)
CALCIUM: 9 mg/dL (ref 8.9–10.3)
CO2: 32 mmol/L (ref 22–32)
Chloride: 103 mmol/L (ref 98–111)
Creatinine, Ser: 1.15 mg/dL — ABNORMAL HIGH (ref 0.44–1.00)
GFR, EST AFRICAN AMERICAN: 58 mL/min — AB (ref >60)
GFR, EST NON AFRICAN AMERICAN: 50 mL/min — AB (ref >60)
Glucose, Bld: 202 mg/dL — ABNORMAL HIGH (ref 70–99)
Potassium: 4.2 mmol/L (ref 3.5–5.1)
SODIUM: 142 mmol/L (ref 135–145)

## 2017-11-25 LAB — GLUCOSE, CAPILLARY
GLUCOSE-CAPILLARY: 159 mg/dL — AB (ref 70–99)
GLUCOSE-CAPILLARY: 210 mg/dL — AB (ref 70–99)
Glucose-Capillary: 208 mg/dL — ABNORMAL HIGH (ref 70–99)
Glucose-Capillary: 127 mg/dL — ABNORMAL HIGH (ref 70–99)

## 2017-11-25 LAB — HEMOGLOBIN A1C
HEMOGLOBIN A1C: 8.2 % — AB (ref 4.8–5.6)
MEAN PLASMA GLUCOSE: 189 mg/dL

## 2017-11-25 MED ORDER — PREDNISONE 10 MG PO TABS: ORAL_TABLET | ORAL | 0 refills | Status: DC

## 2017-11-25 MED ORDER — IPRATROPIUM-ALBUTEROL 0.5-2.5 (3) MG/3ML IN SOLN: 3.0000 mL | Freq: Three times a day (TID) | RESPIRATORY_TRACT | Status: DC

## 2017-11-25 NOTE — Plan of Care (Signed)

## 2017-11-25 NOTE — Care Management Note (Signed)
Case Management Note  Patient Details  Name: Brenda Wu MRN: [id number] Date of Birth: [birth year]  Subjective/Objective:    Admitted to [facility] with the diagnosis of COPD. Lives with husband, Gerlene Burdock [phone]). Last seen Dr. Silvana Newness 1-2 weeks ago. Prescriptions are filled at [facility] in [place]. Home health is currently with [facility] (SN,PT). No skilled nursing. Home oxygen per [facility] x 3 years. Uses 3 liters continuous. Rolling walker in the home. Wants [place]. C-Pap and nebulizer in the home. Takes care of all basic activities of daily living herself, drives. Larey Seat prior to this admission. Good appetite   Discharge to home today per Dr. Renae Gloss              Action/Plan: Resumption of home health orders per [facility]. NIV per [facility]   Expected Discharge Date:  11/25/17               Expected Discharge Plan:     In-House Referral:     Discharge planning Services  CM Consult  Post Acute Care Choice:  Resumption of Svcs/PTA Provider, Home Health Choice offered to:     DME Arranged:   yes DME Agency:   [facility]  HH Arranged:  PT HH Agency:  [facility]  Status of Service:  Completed In process, will continue to follow  If discussed at Long Length of Stay Meetings, dates discussed:    Additional Comments:  Gwenette Greet, RN MSN CCM Care Management [phone] 11/25/2017, 8:57 AM

## 2017-11-25 NOTE — Progress Notes (Signed)
Patient discharged home with spouse. Both IV's removed. Patient verbalized understanding of education. Patient with no complaints.

## 2017-11-25 NOTE — Care Management Important Message (Signed)
Important Message  Patient Details  Name: Brenda Wu MRN: [id number] Date of Birth: Apr 14, 1955   Medicare Important Message Given:  Yes    Olegario Messier A [name] 11/25/2017, 11:26 AM

## 2017-11-25 NOTE — Discharge Instructions (Signed)

## 2017-11-25 NOTE — Progress Notes (Signed)
Pharmacy Electrolyte Monitoring Consult:  Pharmacy consulted to assist in monitoring and replacing electrolytes in this 62 y.o. female admitted on 11/23/2017 with Fall and Altered Mental Status   Labs:  Sodium (mmol/L)  Date Value  11/25/2017 142   Potassium (mmol/L)  Date Value  11/25/2017 4.2   Magnesium (mg/dL)  Date Value  61/60/7371 2.6 (H)   Phosphorus (mg/dL)  Date Value  08/18/9483 4.7 (H)   Calcium (mg/dL)  Date Value  46/27/0350 9.0   Albumin (g/dL)  Date Value  09/38/1829 4.0    Assessment/Plan: Electrolytes WNL. Pharmacy will sign off. Please re-enter consult if assistance is desired.   Carola Frost, Pharm.D., BCPS Clinical Pharmacist 11/25/2017 8:08 AM

## 2017-11-25 NOTE — Discharge Summary (Addendum)
[facility] - [place] at [facility]   PATIENT NAME: Brenda Wu    MR#:  [id number]  DATE OF BIRTH:  01-18-1956  DATE OF ADMISSION:  11/23/2017 ADMITTING PHYSICIAN: Milagros Loll, MD  DATE OF DISCHARGE: 11/25/2017 11:24 AM  PRIMARY CARE PHYSICIAN: Cheron Schaumann., MD    ADMISSION DIAGNOSIS:  Acute on chronic respiratory failure with hypoxia and hypercapnia (HCC) [J96.21, J96.22]  DISCHARGE DIAGNOSIS:  Active Problems:   COPD exacerbation (HCC)   SECONDARY DIAGNOSIS:   Past Medical History:  Diagnosis Date  . CHF (congestive heart failure) (HCC)   . COPD (chronic obstructive pulmonary disease) (HCC)   . Diabetes mellitus without complication (HCC)   . Hypercholesteremia   . Hypertension   . Seizures (HCC)     HOSPITAL COURSE:   1.  Acute on chronic hypoxic hypercarbic respiratory failure.  The patient is a candidate for noninvasive ventilation at night.  Care manager spoke with home health company to set this up.  The patient CPAP at home will not work for this.  Patient's PCO2 was 87 on presentation and the patient required BiPAP initially in the CCU stepdown.  The patient was feeling much better. 2.  COPD exacerbation.  The patient was given Solu-Medrol during the hospital course and nebulizer treatments.  Given a few days of prednisone upon going home. 3.  Chronic combined systolic and diastolic congestive heart failure.  Patient was given Lasix on presentation but BUN and creatinine were elevated so Lasix was held for the rest of the hospitalization.  Can restart tomorrow as outpatient.  No beta-blocker with bronchospasm. 4.  Type 2 diabetes mellitus on glargine insulin and sliding scale while here in the hospital.  She will go back on her oral medications and Lantus insulin as outpatient.  Sugars will be high for the next 2 days while on steroids 5.  History of seizures on Topamax and lamotrigine 6.  Hyperlipidemia unspecified on atorvastatin 7.   History of hypertension on Cardizem 8.  Home health physical therapy and nursing set up.  Home health will set up the noninvasive ventilation at home. 9.  Chronic kidney disease stage III  DISCHARGE CONDITIONS:   Satisfactory  CONSULTS OBTAINED:  Patient was seen by the critical care specialist in the ICU stepdown  DRUG ALLERGIES:   Allergies  Allergen Reactions  . Metformin Other (See Comments)    Other reaction(s): Other (See Comments) Constipation, dry mouth, dizziness  . Bismuth Subsalicylate Rash    Pepto Bismol    DISCHARGE MEDICATIONS:   Allergies as of 11/25/2017      Reactions   Metformin Other (See Comments)   Other reaction(s): Other (See Comments) Constipation, dry mouth, dizziness   Bismuth Subsalicylate Rash   Pepto Bismol      Medication List    TAKE these medications   albuterol 108 (90 Base) MCG/ACT inhaler Commonly known as:  PROVENTIL HFA;VENTOLIN HFA Inhale 2 puffs into the lungs 4 (four) times daily as needed for wheezing or shortness of breath.   albuterol (2.5 MG/3ML) 0.083% nebulizer solution Commonly known as:  PROVENTIL Inhale 2.5 mg into the lungs every 6 (six) hours as needed for wheezing or shortness of breath.   atorvastatin 40 MG tablet Commonly known as:  LIPITOR Take 40 mg by mouth daily.   diltiazem 60 MG 12 hr capsule Commonly known as:  CARDIZEM SR Take 60 mg by mouth daily.   ferrous sulfate 325 (65 FE) MG tablet Take 325 mg by  mouth daily with breakfast.   furosemide 40 MG tablet Commonly known as:  LASIX Take 2 tablets (80 mg total) by mouth daily.   glipiZIDE 5 MG tablet Commonly known as:  GLUCOTROL Take 5 mg by mouth 2 (two) times daily before a meal.   GOODSENSE ASPIRIN 325 MG tablet Generic drug:  aspirin Take 325 mg by mouth daily.   JANUVIA 50 MG tablet Generic drug:  sitaGLIPtin Take 50 mg by mouth daily.   lamoTRIgine 200 MG tablet Commonly known as:  LAMICTAL Take 200 mg by mouth 2 (two) times  daily.   LANTUS SOLOSTAR 100 UNIT/ML Solostar Pen Generic drug:  Insulin Glargine Inject 15 Units into the skin daily.   losartan 50 MG tablet Commonly known as:  COZAAR Take 50 mg by mouth daily.   MULTI-VITAMINS Tabs Take 1 tablet by mouth daily.   niacin 500 MG CR capsule Take 500 mg by mouth daily.   potassium chloride SA 20 MEQ tablet Commonly known as:  K-DUR,KLOR-CON Take 1 tablet (20 mEq total) by mouth daily.   predniSONE 10 MG tablet Commonly known as:  DELTASONE Take 4 tabs po daily for three days What changed:    medication strength  how much to take  how to take this  when to take this  additional instructions   pregabalin 75 MG capsule Commonly known as:  LYRICA Take 75 mg by mouth 2 (two) times daily.   SYMBICORT 160-4.5 MCG/ACT inhaler Generic drug:  budesonide-formoterol Inhale 2 puffs into the lungs 2 (two) times daily.   tiotropium 18 MCG inhalation capsule Commonly known as:  SPIRIVA Place 18 mcg into inhaler and inhale daily.   topiramate 200 MG tablet Commonly known as:  TOPAMAX Take 200 mg by mouth 2 (two) times daily.   vitamin B-12 500 MCG tablet Commonly known as:  CYANOCOBALAMIN Take 500 mcg by mouth daily.        DISCHARGE INSTRUCTIONS:   Follow-up PMD 5 days  If you experience worsening of your admission symptoms, develop shortness of breath, life threatening emergency, suicidal or homicidal thoughts you must seek medical attention immediately by calling 911 or calling your MD immediately  if symptoms less severe.  You Must read complete instructions/literature along with all the possible adverse reactions/side effects for all the Medicines you take and that have been prescribed to you. Take any new Medicines after you have completely understood and accept all the possible adverse reactions/side effects.   Please note  You were cared for by a hospitalist during your hospital stay. If you have any questions about your  discharge medications or the care you received while you were in the hospital after you are discharged, you can call the unit and asked to speak with the hospitalist on call if the hospitalist that took care of you is not available. Once you are discharged, your primary care physician will handle any further medical issues. Please note that NO REFILLS for any discharge medications will be authorized once you are discharged, as it is imperative that you return to your primary care physician (or establish a relationship with a primary care physician if you do not have one) for your aftercare needs so that they can reassess your need for medications and monitor your lab values.    Today   CHIEF COMPLAINT:   Chief Complaint  Patient presents with  . Fall  . Altered Mental Status    HISTORY OF PRESENT ILLNESS:  Brenda Wu  is  a 62 y.o. female brought in with fall and altered mental status and found to have a very elevated PCO2   VITAL SIGNS:  Blood pressure (!) 149/84, pulse 88, temperature (!) 97.5 F (36.4 C), temperature source Oral, resp. rate 16, height 5\' 4"  (1.626 m), weight 112.7 kg, SpO2 96 %.    PHYSICAL EXAMINATION:  GENERAL:  62 y.o.-year-old patient lying in the bed with no acute distress.  EYES: Pupils equal, round, reactive to light and accommodation. No scleral icterus. Extraocular muscles intact.  HEENT: Head atraumatic, normocephalic. Oropharynx and nasopharynx clear.  NECK:  Supple, no jugular venous distention. No thyroid enlargement, no tenderness.  LUNGS: Decreased breath sounds bilateral bases, no wheezing, rales,rhonchi or crepitation. No use of accessory muscles of respiration.  CARDIOVASCULAR: S1, S2 normal. No murmurs, rubs, or gallops.  ABDOMEN: Soft, non-tender, non-distended. Bowel sounds present. No organomegaly or mass.  EXTREMITIES: Trace pedal edema, no cyanosis, or clubbing.  NEUROLOGIC: Cranial nerves II through XII are intact. Muscle strength 5/5  in all extremities. Sensation intact. Gait not checked.  PSYCHIATRIC: The patient is alert and oriented x 3.  SKIN: No obvious rash, lesion, or ulcer.   DATA REVIEW:   CBC Recent Labs  Lab 11/24/17 0421  WBC 5.1  HGB 15.6  HCT 48.4*  PLT 63*    Chemistries  Recent Labs  Lab 11/23/17 1126 11/24/17 0421 11/25/17 0346  NA 145 144 142  K 3.8 4.2 4.2  CL 102 103 103  CO2 34* 35* 32  GLUCOSE 113* 281* 202*  BUN 17 26* 26*  CREATININE 1.27* 1.31* 1.15*  CALCIUM 8.9 8.5* 9.0  MG  --  2.6*  --   AST 33  --   --   ALT 40  --   --   ALKPHOS 119  --   --   BILITOT 1.0  --   --     Cardiac Enzymes No results for input(s): TROPONINI in the last 168 hours.  Microbiology Results  Results for orders placed or performed during the hospital encounter of 11/23/17  MRSA PCR Screening     Status: None   Collection Time: 11/23/17  5:49 PM  Result Value Ref Range Status   MRSA by PCR NEGATIVE NEGATIVE Final    Comment:        The GeneXpert MRSA Assay (FDA approved for NASAL specimens only), is one component of a comprehensive MRSA colonization surveillance program. It is not intended to diagnose MRSA infection nor to guide or monitor treatment for MRSA infections. Performed at [facility], [address] [place], [place] [address]     RADIOLOGY:  Ct Head Wo Contrast  Result Date: 11/23/2017 CLINICAL DATA:  Minor head trauma with fall. EXAM: CT HEAD WITHOUT CONTRAST TECHNIQUE: Contiguous axial images were obtained from the base of the skull through the vertex without intravenous contrast. COMPARISON:  Brain MRI 06/11/2015 FINDINGS: Brain: No evidence of acute infarction, hemorrhage, hydrocephalus, extra-axial collection or mass lesion/mass effect. Partially empty sella, also seen in 2017, nonspecific in isolation. Vascular: Mild arterial calcification. Skull: No acute finding Sinuses/Orbits: Negative IMPRESSION: No acute finding. Electronically Signed   By: Marnee Spring M.D.   On: 11/23/2017 14:45     Management plans discussed with the patient, and she is in agreement.  Left a message for patient's husband.  The patient actually wanted to go home the day prior.  CODE STATUS:     Code Status Orders  (From admission, onward)  Start     Ordered   11/23/17 1506  Full code  Continuous     11/23/17 1506        Code Status History    Date Active Date Inactive Code Status Order ID Comments User Context   11/04/2017 1538 11/09/2017 1335 Full Code [id number]  Adrian Saran, MD ED   02/07/2017 0909 02/12/2017 1620 Full Code [id number]  Shaune Pollack, MD Inpatient   11/01/2016 1726 11/04/2016 1447 Full Code [id number]  Gracelyn Nurse, MD Inpatient      TOTAL TIME TAKING CARE OF THIS PATIENT: 35 minutes.    Alford Highland M.D on 11/25/2017 at 2:09 PM  Between 7am to 6pm - Pager - [phone]  After 6pm go to [email or web address] - password [email or web address]  [facility] Office  [phone]  CC: Primary care physician; Cheron Schaumann., MD

## 2017-11-25 NOTE — Progress Notes (Signed)
Inpatient Diabetes Program Recommendations  AACE/ADA: New Consensus Statement on Inpatient Glycemic Control (2015)  Target Ranges:  Prepandial:   less than 140 mg/dL      Peak postprandial:   less than 180 mg/dL (1-2 hours)      Critically ill patients:  140 - 180 mg/dL   Results for Brenda Wu, Brenda Wu (MRN [id number]) as of 11/25/2017 09:41  Ref. Range 11/23/2017 23:34 11/24/2017 03:58 11/24/2017 07:09 11/24/2017 12:49 11/24/2017 15:40 11/24/2017 21:17  Glucose-Capillary Latest Ref Range: 70 - 99 mg/dL 354 (H)  20 units NOVOLOG 283 (H)  11 units NOVOLOG 151 (H)  4 units NOVOLOG 223 (H)  7 units NOVOLOG 242 (H)  7 units NOVOLOG 248 (H)  7 units NOVOLOG +  15 units LANTUS    Results for Brenda Wu, Brenda Wu (MRN [id number]) as of 11/25/2017 09:41  Ref. Range 11/25/2017 00:13 11/25/2017 04:11 11/25/2017 07:49  Glucose-Capillary Latest Ref Range: 70 - 99 mg/dL 700 (H)  7 units NOVOLOG 208 (H)  7 units NOVOLOG 127 (H)    Home DM Meds: Lantus 15 units Daily       Glipizide 5 mg BID       Januvia 50 mg Daily  Current Orders: Lantus 15 units QHS     Novolog Resistant Correction Scale/ SSI (0-20 units) Q4 hours      Note patient currently receiving Solumedrol 40 mg BID.  Allowed solid PO diet.  Having glucose elevations in the afternoon.     MD- Please consider the following in-hospital insulin adjustments:  1. Change Novolog SSI to TID AC + HS (Currently ordered Q4 hours and pt consuming solid PO diet)  2. Start Novolog Meal Coverage: Novolog 4 units TID with meals   (Please add the following Hold Parameters: Hold if pt eats <50% of meal, Hold if pt NPO)      --Will follow patient during hospitalization--  Ambrose Finland RN, MSN, CDE Diabetes Coordinator Inpatient Glycemic Control Team Team Pager: [phone] (8a-5p)

## 2017-11-25 NOTE — Progress Notes (Signed)
Patient ID: Brenda Wu, female   DOB: December 25, 1955, 62 y.o.   MRN: [id number]   The patient is a candidate for noninvasive ventilation at home.  Secondary to end-stage COPD with chronic respiratory failure on 2 L.  The patient had a high PCO2 of 87 on presentation and required BiPAP in ICU initially.   The patient continues to exhibit signs of hypercapnia associated with chronic respiratory failure secondary to severe COPD.  The patient requires both use of noninvasive ventilation at night and can use during the daytime with exacerbation periods.  The use of noninvasive ventilation will treat the patient's high PCO2 and can reduce the risk of exacerbations and future hospitalization when used at night.  Patient will need these advanced settings in conjunction with her current medication regimen.  BiPAP is not an option due to its functional limitations in severity of the patient's condition.  Failure to have noninvasive ventilation available for use over 24-hour peroid Could lead to readmissions and death.  Dr. Alford Highland

## 2017-12-12 ENCOUNTER — Encounter: Payer: Self-pay | Admitting: Family

## 2017-12-12 ENCOUNTER — Ambulatory Visit: Payer: Medicare HMO | Attending: Family | Admitting: Family

## 2017-12-12 VITALS — BP 117/68 | HR 83 | Resp 18 | Ht 64.0 in | Wt 250.2 lb

## 2017-12-12 DIAGNOSIS — Z79899 Other long term (current) drug therapy: Secondary | ICD-10-CM | POA: Insufficient documentation

## 2017-12-12 DIAGNOSIS — R569 Unspecified convulsions: Secondary | ICD-10-CM | POA: Insufficient documentation

## 2017-12-12 DIAGNOSIS — Z8249 Family history of ischemic heart disease and other diseases of the circulatory system: Secondary | ICD-10-CM | POA: Insufficient documentation

## 2017-12-12 DIAGNOSIS — Z7951 Long term (current) use of inhaled steroids: Secondary | ICD-10-CM | POA: Diagnosis not present

## 2017-12-12 DIAGNOSIS — E119 Type 2 diabetes mellitus without complications: Secondary | ICD-10-CM | POA: Diagnosis not present

## 2017-12-12 DIAGNOSIS — Z8673 Personal history of transient ischemic attack (TIA), and cerebral infarction without residual deficits: Secondary | ICD-10-CM | POA: Insufficient documentation

## 2017-12-12 DIAGNOSIS — I10 Essential (primary) hypertension: Secondary | ICD-10-CM | POA: Insufficient documentation

## 2017-12-12 DIAGNOSIS — I5022 Chronic systolic (congestive) heart failure: Secondary | ICD-10-CM

## 2017-12-12 DIAGNOSIS — R5383 Other fatigue: Secondary | ICD-10-CM | POA: Diagnosis present

## 2017-12-12 DIAGNOSIS — F329 Major depressive disorder, single episode, unspecified: Secondary | ICD-10-CM | POA: Insufficient documentation

## 2017-12-12 DIAGNOSIS — I110 Hypertensive heart disease with heart failure: Secondary | ICD-10-CM | POA: Diagnosis not present

## 2017-12-12 DIAGNOSIS — J449 Chronic obstructive pulmonary disease, unspecified: Secondary | ICD-10-CM | POA: Insufficient documentation

## 2017-12-12 DIAGNOSIS — I509 Heart failure, unspecified: Secondary | ICD-10-CM | POA: Diagnosis not present

## 2017-12-12 DIAGNOSIS — Z7982 Long term (current) use of aspirin: Secondary | ICD-10-CM | POA: Diagnosis not present

## 2017-12-12 DIAGNOSIS — Z794 Long term (current) use of insulin: Secondary | ICD-10-CM | POA: Diagnosis not present

## 2017-12-12 DIAGNOSIS — J4489 Other specified chronic obstructive pulmonary disease: Secondary | ICD-10-CM

## 2017-12-12 DIAGNOSIS — E7800 Pure hypercholesterolemia, unspecified: Secondary | ICD-10-CM | POA: Diagnosis not present

## 2017-12-12 DIAGNOSIS — Z87891 Personal history of nicotine dependence: Secondary | ICD-10-CM | POA: Diagnosis not present

## 2017-12-12 DIAGNOSIS — I5042 Chronic combined systolic (congestive) and diastolic (congestive) heart failure: Secondary | ICD-10-CM | POA: Insufficient documentation

## 2017-12-12 NOTE — Patient Instructions (Addendum)
Continue weighing daily and call for an overnight weight gain of > 2 pounds or a weekly weight gain of >5 pounds.  Get compression socks and place on legs. Wear them daily with removal at bedtime and elevate your legs when sitting for long periods of time.

## 2017-12-12 NOTE — Progress Notes (Signed)
Patient ID: Brenda Wu, female    DOB: Feb 26, 1955, 62 y.o.   MRN: [id number]  HPI  Ms Fulwider is a 62 y/o female with a history of DM, hyperlipidemia, , stroke, depression, HTN, seizures, COPD, previous tobacco use and chronic heart failure.   Echo report from 11/05/17 reviewed and showed an EF of 45-50% along with mild/moderate MR/ TR and normal PA pressure.   Admitted 11/23/17 due to COPD exacerbation along with HF. Started on bipap and symptoms improved. Given solu-medrol and IV lasix and then transitioned to oral medications. Discharged after 2 days. Admitted 11/04/17 due to acute on chronic respiratory failure due to pneumonia and COPD/HF exacerbation. Initially needed bipap and was weaned back to oxygen at 2L nasal cannula. Treated with antibiotics along with IV solu-medrol. Mildly elevated troponin thought to be due to demand ischemia. Discharged after 5 days.   She presents today for her initial visit with a chief complaint of moderate fatigue upon minimal exertion. She says that this has been present for several years. She has associated cough, shortness of breath and wheezing along with this. She denies any difficulty sleeping, abdominal distention, palpitations, pedal edema, chest pain or dizziness. She is very sleepy in the exam room today and falls asleep while talking. She wakes up easily and says that she doesn't know why she's so sleepy this morning.   Past Medical History:  Diagnosis Date  . CHF (congestive heart failure) (HCC)   . COPD (chronic obstructive pulmonary disease) (HCC)   . Diabetes mellitus without complication (HCC)   . Hypercholesteremia   . Hypertension   . Seizures (HCC)    Past Surgical History:  Procedure Laterality Date  . REPLACEMENT TOTAL KNEE     Family History  Problem Relation Age of Onset  . Hypertension Father   . Asthma Sister   . Heart murmur Sister   . Diabetes Sister    Social History   Tobacco Use  . Smoking status: Former  Smoker    Packs/day: 1.00    Years: 30.00    Pack years: 30.00    Types: Cigarettes  . Smokeless tobacco: Never Used  Substance Use Topics  . Alcohol use: No   Allergies  Allergen Reactions  . Metformin Other (See Comments)    Other reaction(s): Other (See Comments) Constipation, dry mouth, dizziness  . Bismuth Subsalicylate Rash    Pepto Bismol   Prior to Admission medications   Medication Sig Start Date End Date Taking? Authorizing Provider  albuterol (PROVENTIL HFA;VENTOLIN HFA) 108 (90 Base) MCG/ACT inhaler Inhale 2 puffs into the lungs 4 (four) times daily as needed for wheezing or shortness of breath.  01/05/16  Yes [provider]  albuterol (PROVENTIL) (2.5 MG/3ML) 0.083% nebulizer solution Inhale 2.5 mg into the lungs every 6 (six) hours as needed for wheezing or shortness of breath.  10/01/16  Yes [provider]  aspirin (GOODSENSE ASPIRIN) 325 MG tablet Take 325 mg by mouth daily. 01/31/08  Yes [provider]  atorvastatin (LIPITOR) 40 MG tablet Take 40 mg by mouth daily.   Yes [provider]  budesonide-formoterol (SYMBICORT) 160-4.5 MCG/ACT inhaler Inhale 2 puffs into the lungs 2 (two) times daily. 05/22/15  Yes [provider]  diltiazem (CARDIZEM SR) 60 MG 12 hr capsule Take 60 mg by mouth daily.    Yes [provider]  ferrous sulfate 325 (65 FE) MG tablet Take 325 mg by mouth daily with breakfast.   Yes [provider]  furosemide (LASIX) 40 MG tablet Take 2 tablets (80 mg total) by mouth daily. 11/08/17 11/08/18 Yes Salary, Jetty Duhamel D, MD  glipiZIDE (GLUCOTROL) 5 MG tablet Take 5 mg by mouth 2 (two) times daily before a meal.    Yes [provider]  Insulin Glargine (LANTUS SOLOSTAR) 100 UNIT/ML Solostar Pen Inject 15 Units into the skin daily. 01/21/17  Yes [provider]  lamoTRIgine (LAMICTAL) 200 MG tablet Take 200 mg by mouth 2 (two) times daily.    Yes [provider]   losartan (COZAAR) 50 MG tablet Take 50 mg by mouth daily.   Yes [provider]  Multiple Vitamin (MULTI-VITAMINS) TABS Take 1 tablet by mouth daily.   Yes [provider]  niacin 500 MG CR capsule Take 500 mg by mouth daily. 08/15/15  Yes [provider]  potassium chloride SA (K-DUR,KLOR-CON) 20 MEQ tablet Take 1 tablet (20 mEq total) by mouth daily. 02/12/17  Yes [name], MD  pregabalin (LYRICA) 75 MG capsule Take 75 mg by mouth 2 (two) times daily. 01/11/17 12/12/17 Yes [provider]  sitaGLIPtin (JANUVIA) 50 MG tablet Take 50 mg by mouth daily.   Yes [provider]  tiotropium (SPIRIVA) 18 MCG inhalation capsule Place 18 mcg into inhaler and inhale daily.   Yes [provider]  topiramate (TOPAMAX) 200 MG tablet Take 200 mg by mouth 2 (two) times daily.   Yes [provider]  vitamin B-12 (CYANOCOBALAMIN) 500 MCG tablet Take 500 mcg by mouth daily.   Yes [provider]    Review of Systems  Constitutional: Positive for fatigue. Negative for appetite change.  HENT: Negative for congestion, postnasal drip and sore throat.   Eyes: Negative.   Respiratory: Positive for cough, shortness of breath and wheezing.   Cardiovascular: Negative for chest pain, palpitations and leg swelling.  Gastrointestinal: Negative for abdominal distention and abdominal pain.  Endocrine: Negative.   Genitourinary: Negative.   Musculoskeletal: Negative for back pain and neck pain.  Skin: Negative.   Allergic/Immunologic: Negative.   Neurological: Negative for dizziness and light-headedness.  Hematological: Negative for adenopathy. Does not bruise/bleed easily.  Psychiatric/Behavioral: Negative for dysphoric mood and sleep disturbance (wearing oxygen @2L  and bipap). The patient is not nervous/anxious.    Vitals:   12/12/17 1011  BP: 117/68  Pulse: 83  Resp: 18  SpO2: 92%  Weight: 250 lb 4 oz (113.5 kg)  Height: 5\' 4"   (1.626 m)   Wt Readings from Last 3 Encounters:  12/12/17 250 lb 4 oz (113.5 kg)  11/25/17 248 lb 7.3 oz (112.7 kg)  11/09/17 275 lb 9.2 oz (125 kg)   Lab Results  Component Value Date   CREATININE 1.15 (H) 11/25/2017   CREATININE 1.31 (H) 11/24/2017   CREATININE 1.27 (H) 11/23/2017   Physical Exam  Constitutional: She is oriented to person, place, and time. She appears well-developed and well-nourished.  HENT:  Head: Normocephalic and atraumatic.  Neck: Normal range of motion. Neck supple. No JVD present.  Cardiovascular: Normal rate and regular rhythm.  Pulmonary/Chest: Effort normal. No respiratory distress. She has wheezes in the right upper field and the left upper field. She has no rales.  Abdominal: Soft. She exhibits no distension.  Musculoskeletal:       Right lower leg: She exhibits edema (2+pitting ). She exhibits no tenderness.       Left lower leg: She exhibits edema (2+ pitting). She exhibits no tenderness.  Neurological: She is oriented to person,  place, and time.  Skin: Skin is warm and dry.  Psychiatric: Her mood appears not anxious. She is not agitated.  Patient falling asleep in the exam room. Wakes up easily  Nursing note and vitals reviewed.   Assessment & Plan:  1: Chronic heart failure with mildly reduced ejection fraction- - NYHA class III - mildly fluid overloaded today - weighing daily and she says that her weight has been stable. Reviewed the importance of calling for an overnight weight gain of >2 pounds or a weekly weight gain of >5 pounds - not adding salt to her food. Reviewed the importance of closely following a 2000mg  sodium diet but difficult to do much education as patient was falling asleep while in the exam room. Written dietary information give to her about this. - EF mildly reduced so would not qualify for entresto - elevate legs when sitting for long periods of time  - apply compression socks daily with removal at bedtime - she says  that she's received her flu vaccine already - BNP 11/23/17 was 480.0  2: HTN- - BP looks good today - BMP done 11/25/17 reviewed and showed sodium 142, potassium 4.2, creatinine 1.15 and GFR 58 - saw PCP Thresa Ross) 12/02/17  3: DM-  - glucose this morning at home was 91 - A1c 11/23/17 was 8.2%  4: COPD- - wearing oxygen at 2L around the clock although patient presented with her oxygen set on 1L - wearing bipap at night - has not used her nebulizer yet today and she was instructed to go home and use the nebulizer - saw pulmonologist Su Monks) 09/20/17  Patient did not bring her medications nor a list. Each medication was verbally reviewed with the patient and she was encouraged to bring the bottles to every visit to confirm accuracy of list.  Return in 2 months or sooner for any questions/problems before then.

## 2017-12-26 ENCOUNTER — Inpatient Hospital Stay: Payer: Medicare HMO | Admitting: Pulmonary Disease

## 2018-01-05 ENCOUNTER — Encounter: Payer: Self-pay | Admitting: Emergency Medicine

## 2018-01-05 ENCOUNTER — Inpatient Hospital Stay
Admission: EM | Admit: 2018-01-05 | Discharge: 2018-01-10 | DRG: 280 | Disposition: A | Discharge status: Home Health | Payer: Medicare HMO | Attending: Internal Medicine | Admitting: Internal Medicine

## 2018-01-05 ENCOUNTER — Emergency Department: Payer: Medicare HMO

## 2018-01-05 ENCOUNTER — Other Ambulatory Visit: Payer: Self-pay

## 2018-01-05 ENCOUNTER — Inpatient Hospital Stay: Payer: Medicare HMO

## 2018-01-05 DIAGNOSIS — J9621 Acute and chronic respiratory failure with hypoxia: Secondary | ICD-10-CM | POA: Diagnosis not present

## 2018-01-05 DIAGNOSIS — F329 Major depressive disorder, single episode, unspecified: Secondary | ICD-10-CM | POA: Diagnosis present

## 2018-01-05 DIAGNOSIS — N289 Disorder of kidney and ureter, unspecified: Secondary | ICD-10-CM | POA: Diagnosis present

## 2018-01-05 DIAGNOSIS — I248 Other forms of acute ischemic heart disease: Secondary | ICD-10-CM | POA: Diagnosis present

## 2018-01-05 DIAGNOSIS — I214 Non-ST elevation (NSTEMI) myocardial infarction: Principal | ICD-10-CM

## 2018-01-05 DIAGNOSIS — G4733 Obstructive sleep apnea (adult) (pediatric): Secondary | ICD-10-CM | POA: Diagnosis present

## 2018-01-05 DIAGNOSIS — E1165 Type 2 diabetes mellitus with hyperglycemia: Secondary | ICD-10-CM | POA: Diagnosis present

## 2018-01-05 DIAGNOSIS — J9622 Acute and chronic respiratory failure with hypercapnia: Secondary | ICD-10-CM | POA: Diagnosis present

## 2018-01-05 DIAGNOSIS — Z7951 Long term (current) use of inhaled steroids: Secondary | ICD-10-CM

## 2018-01-05 DIAGNOSIS — E870 Hyperosmolality and hypernatremia: Secondary | ICD-10-CM | POA: Diagnosis present

## 2018-01-05 DIAGNOSIS — I5043 Acute on chronic combined systolic (congestive) and diastolic (congestive) heart failure: Secondary | ICD-10-CM | POA: Diagnosis present

## 2018-01-05 DIAGNOSIS — Z6841 Body Mass Index (BMI) 40.0 and over, adult: Secondary | ICD-10-CM

## 2018-01-05 DIAGNOSIS — E7800 Pure hypercholesterolemia, unspecified: Secondary | ICD-10-CM | POA: Diagnosis present

## 2018-01-05 DIAGNOSIS — R0602 Shortness of breath: Secondary | ICD-10-CM | POA: Diagnosis not present

## 2018-01-05 DIAGNOSIS — T502X5A Adverse effect of carbonic-anhydrase inhibitors, benzothiadiazides and other diuretics, initial encounter: Secondary | ICD-10-CM | POA: Diagnosis present

## 2018-01-05 DIAGNOSIS — J9602 Acute respiratory failure with hypercapnia: Secondary | ICD-10-CM

## 2018-01-05 DIAGNOSIS — Z888 Allergy status to other drugs, medicaments and biological substances status: Secondary | ICD-10-CM

## 2018-01-05 DIAGNOSIS — I110 Hypertensive heart disease with heart failure: Secondary | ICD-10-CM | POA: Diagnosis present

## 2018-01-05 DIAGNOSIS — Z7982 Long term (current) use of aspirin: Secondary | ICD-10-CM

## 2018-01-05 DIAGNOSIS — Z794 Long term (current) use of insulin: Secondary | ICD-10-CM

## 2018-01-05 DIAGNOSIS — J441 Chronic obstructive pulmonary disease with (acute) exacerbation: Secondary | ICD-10-CM | POA: Diagnosis present

## 2018-01-05 DIAGNOSIS — J432 Centrilobular emphysema: Secondary | ICD-10-CM | POA: Diagnosis not present

## 2018-01-05 DIAGNOSIS — Z96659 Presence of unspecified artificial knee joint: Secondary | ICD-10-CM | POA: Diagnosis present

## 2018-01-05 DIAGNOSIS — Z8673 Personal history of transient ischemic attack (TIA), and cerebral infarction without residual deficits: Secondary | ICD-10-CM | POA: Diagnosis not present

## 2018-01-05 DIAGNOSIS — R0689 Other abnormalities of breathing: Secondary | ICD-10-CM | POA: Diagnosis not present

## 2018-01-05 DIAGNOSIS — E118 Type 2 diabetes mellitus with unspecified complications: Secondary | ICD-10-CM | POA: Diagnosis not present

## 2018-01-05 DIAGNOSIS — E662 Morbid (severe) obesity with alveolar hypoventilation: Secondary | ICD-10-CM | POA: Diagnosis not present

## 2018-01-05 DIAGNOSIS — I429 Cardiomyopathy, unspecified: Secondary | ICD-10-CM | POA: Diagnosis present

## 2018-01-05 DIAGNOSIS — E785 Hyperlipidemia, unspecified: Secondary | ICD-10-CM | POA: Diagnosis present

## 2018-01-05 DIAGNOSIS — Z8249 Family history of ischemic heart disease and other diseases of the circulatory system: Secondary | ICD-10-CM

## 2018-01-05 DIAGNOSIS — D696 Thrombocytopenia, unspecified: Secondary | ICD-10-CM | POA: Diagnosis present

## 2018-01-05 DIAGNOSIS — Z833 Family history of diabetes mellitus: Secondary | ICD-10-CM

## 2018-01-05 DIAGNOSIS — E876 Hypokalemia: Secondary | ICD-10-CM | POA: Diagnosis present

## 2018-01-05 DIAGNOSIS — Z9981 Dependence on supplemental oxygen: Secondary | ICD-10-CM

## 2018-01-05 DIAGNOSIS — G9340 Encephalopathy, unspecified: Secondary | ICD-10-CM | POA: Diagnosis present

## 2018-01-05 DIAGNOSIS — Z87891 Personal history of nicotine dependence: Secondary | ICD-10-CM

## 2018-01-05 DIAGNOSIS — G40909 Epilepsy, unspecified, not intractable, without status epilepticus: Secondary | ICD-10-CM | POA: Diagnosis present

## 2018-01-05 DIAGNOSIS — Z79899 Other long term (current) drug therapy: Secondary | ICD-10-CM

## 2018-01-05 DIAGNOSIS — J9601 Acute respiratory failure with hypoxia: Secondary | ICD-10-CM

## 2018-01-05 LAB — GLUCOSE, CAPILLARY
GLUCOSE-CAPILLARY: 114 mg/dL — AB (ref 70–99)
Glucose-Capillary: 106 mg/dL — ABNORMAL HIGH (ref 70–99)
Glucose-Capillary: 102 mg/dL — ABNORMAL HIGH (ref 70–99)
Glucose-Capillary: 131 mg/dL — ABNORMAL HIGH (ref 70–99)

## 2018-01-05 LAB — CBC WITH DIFFERENTIAL/PLATELET
ABS IMMATURE GRANULOCYTES: 0.03 10*3/uL (ref 0.00–0.07)
BASOS ABS: 0.1 10*3/uL (ref 0.0–0.1)
Basophils Relative: 1 %
Eosinophils Absolute: 0.1 10*3/uL (ref 0.0–0.5)
Eosinophils Relative: 1 %
HEMATOCRIT: 59 % — AB (ref 36.0–46.0)
HEMOGLOBIN: 17.5 g/dL — AB (ref 12.0–15.0)
IMMATURE GRANULOCYTES: 0 %
LYMPHS ABS: 1 10*3/uL (ref 0.7–4.0)
Lymphocytes Relative: 14 %
MCH: 30.4 pg (ref 26.0–34.0)
MCHC: 29.7 g/dL — ABNORMAL LOW (ref 30.0–36.0)
MCV: 102.6 fL — AB (ref 80.0–100.0)
MONOS PCT: 8 %
Monocytes Absolute: 0.6 10*3/uL (ref 0.1–1.0)
Neutro Abs: 5.5 10*3/uL (ref 1.7–7.7)
Neutrophils Relative %: 76 %
Platelets: 54 10*3/uL — ABNORMAL LOW (ref 150–400)
RBC: 5.75 MIL/uL — ABNORMAL HIGH (ref 3.87–5.11)
RDW: 19.9 % — ABNORMAL HIGH (ref 11.5–15.5)
WBC: 7.2 10*3/uL (ref 4.0–10.5)
nRBC: 0 % (ref 0.0–0.2)

## 2018-01-05 LAB — URINE DRUG SCREEN, QUALITATIVE (ARMC ONLY)
Amphetamines, Ur Screen: NOT DETECTED
Barbiturates, Ur Screen: NOT DETECTED
Benzodiazepine, Ur Scrn: NOT DETECTED
CANNABINOID 50 NG, UR ~~LOC~~: NOT DETECTED
COCAINE METABOLITE, UR ~~LOC~~: NOT DETECTED
MDMA (Ecstasy)Ur Screen: NOT DETECTED
METHADONE SCREEN, URINE: NOT DETECTED
Opiate, Ur Screen: NOT DETECTED
Phencyclidine (PCP) Ur S: NOT DETECTED
TRICYCLIC, UR SCREEN: NOT DETECTED

## 2018-01-05 LAB — URINALYSIS, COMPLETE (UACMP) WITH MICROSCOPIC
BACTERIA UA: NONE SEEN
BILIRUBIN URINE: NEGATIVE
Glucose, UA: NEGATIVE mg/dL
Hgb urine dipstick: NEGATIVE
Ketones, ur: NEGATIVE mg/dL
Leukocytes, UA: NEGATIVE
Nitrite: NEGATIVE
PROTEIN: NEGATIVE mg/dL
SPECIFIC GRAVITY, URINE: 1.01 (ref 1.005–1.030)
WBC UA: NONE SEEN WBC/hpf (ref 0–5)
pH: 7 (ref 5.0–8.0)

## 2018-01-05 LAB — COMPREHENSIVE METABOLIC PANEL
ALBUMIN: 4.1 g/dL (ref 3.5–5.0)
ALK PHOS: 134 U/L — AB (ref 38–126)
ALT: 20 U/L (ref 0–44)
AST: 26 U/L (ref 15–41)
Anion gap: 11 (ref 5–15)
BILIRUBIN TOTAL: 1.4 mg/dL — AB (ref 0.3–1.2)
BUN: 13 mg/dL (ref 8–23)
CO2: 33 mmol/L — ABNORMAL HIGH (ref 22–32)
CREATININE: 1.04 mg/dL — AB (ref 0.44–1.00)
Calcium: 9 mg/dL (ref 8.9–10.3)
Chloride: 102 mmol/L (ref 98–111)
GFR calc Af Amer: >60 mL/min (ref >60)
GFR, EST NON AFRICAN AMERICAN: 57 mL/min — AB (ref >60)
GLUCOSE: 146 mg/dL — AB (ref 70–99)
Potassium: 3.3 mmol/L — ABNORMAL LOW (ref 3.5–5.1)
Sodium: 146 mmol/L — ABNORMAL HIGH (ref 135–145)
TOTAL PROTEIN: 6.8 g/dL (ref 6.5–8.1)

## 2018-01-05 LAB — BLOOD GAS, ARTERIAL
Acid-Base Excess: 3.7 mmol/L — ABNORMAL HIGH (ref 0.0–2.0)
Bicarbonate: 35.2 mmol/L — ABNORMAL HIGH (ref 20.0–28.0)
DELIVERY SYSTEMS: POSITIVE
Expiratory PAP: 5
FIO2: 0.35
Inspiratory PAP: 12
LHR: 8 {breaths}/min
O2 Saturation: 90.2 %
PH ART: 7.24 — AB (ref 7.350–7.450)
Patient temperature: 36.6
pCO2 arterial: 84 mmHg (ref 32.0–48.0)
pO2, Arterial: 68 mmHg — ABNORMAL LOW (ref 83.0–108.0)

## 2018-01-05 LAB — BLOOD GAS, VENOUS
ACID-BASE EXCESS: 2.9 mmol/L — AB (ref 0.0–2.0)
BICARBONATE: 34.4 mmol/L — AB (ref 20.0–28.0)
FIO2: 1
O2 Saturation: 99.2 %
Patient temperature: 37
pCO2, Ven: 84 mmHg (ref 44.0–60.0)
pH, Ven: 7.22 — ABNORMAL LOW (ref 7.250–7.430)
pO2, Ven: 162 mmHg — ABNORMAL HIGH (ref 32.0–45.0)

## 2018-01-05 LAB — APTT: aPTT: 27 seconds (ref 24–36)

## 2018-01-05 LAB — MRSA PCR SCREENING: MRSA by PCR: NEGATIVE

## 2018-01-05 LAB — TROPONIN I
TROPONIN I: 1.51 ng/mL — AB (ref <0.03)
Troponin I: 1.78 ng/mL (ref <0.03)
Troponin I: 1.57 ng/mL (ref <0.03)

## 2018-01-05 LAB — HEPARIN LEVEL (UNFRACTIONATED): Heparin Unfractionated: 0.3 IU/mL (ref 0.30–0.70)

## 2018-01-05 LAB — PROTIME-INR
INR: 1.13
PROTHROMBIN TIME: 14.4 s (ref 11.4–15.2)

## 2018-01-05 LAB — MAGNESIUM: MAGNESIUM: 2.6 mg/dL — AB (ref 1.7–2.4)

## 2018-01-05 MED ORDER — ONDANSETRON HCL 4 MG PO TABS: 4.0000 mg | ORAL_TABLET | Freq: Four times a day (QID) | ORAL | Status: DC | PRN

## 2018-01-05 MED ORDER — CHLORHEXIDINE GLUCONATE 0.12 % MT SOLN
15.0000 mL | Freq: Two times a day (BID) | OROMUCOSAL | Status: DC
  Administered 2018-01-05 – 2018-01-06 (×2): 15 mL via OROMUCOSAL
  Filled 2018-01-05 (×2): qty 15

## 2018-01-05 MED ORDER — FUROSEMIDE 10 MG/ML IJ SOLN
40.0000 mg | Freq: Once | INTRAMUSCULAR | Status: AC
  Administered 2018-01-05: 40 mg via INTRAVENOUS
  Filled 2018-01-05: qty 4

## 2018-01-05 MED ORDER — SENNOSIDES-DOCUSATE SODIUM 8.6-50 MG PO TABS: 1.0000 | ORAL_TABLET | Freq: Every evening | ORAL | Status: DC | PRN

## 2018-01-05 MED ORDER — CLOPIDOGREL BISULFATE 75 MG PO TABS
75.0000 mg | ORAL_TABLET | Freq: Every day | ORAL | Status: DC
  Administered 2018-01-06 – 2018-01-09 (×4): 75 mg via ORAL
  Filled 2018-01-05 (×4): qty 1

## 2018-01-05 MED ORDER — GUAIFENESIN-DM 100-10 MG/5ML PO SYRP
5.0000 mL | ORAL_SOLUTION | ORAL | Status: DC | PRN
  Filled 2018-01-05: qty 5

## 2018-01-05 MED ORDER — HEPARIN (PORCINE) 25000 UT/250ML-% IV SOLN
1200.0000 [IU]/h | INTRAVENOUS | Status: DC
  Administered 2018-01-05 – 2018-01-08 (×4): 1000 [IU]/h via INTRAVENOUS
  Filled 2018-01-05 (×4): qty 250

## 2018-01-05 MED ORDER — MOMETASONE FURO-FORMOTEROL FUM 200-5 MCG/ACT IN AERO: 2.0000 | INHALATION_SPRAY | Freq: Two times a day (BID) | RESPIRATORY_TRACT | Status: DC

## 2018-01-05 MED ORDER — NIACIN ER 500 MG PO TBCR
500.0000 mg | EXTENDED_RELEASE_TABLET | Freq: Every day | ORAL | Status: DC
  Administered 2018-01-06 – 2018-01-10 (×5): 500 mg via ORAL
  Filled 2018-01-05 (×5): qty 1

## 2018-01-05 MED ORDER — HYDROCODONE-ACETAMINOPHEN 5-325 MG PO TABS
1.0000 | ORAL_TABLET | ORAL | Status: DC | PRN
  Administered 2018-01-07: 2 via ORAL
  Administered 2018-01-07: 1 via ORAL
  Administered 2018-01-07 – 2018-01-10 (×6): 2 via ORAL
  Filled 2018-01-05: qty 1
  Filled 2018-01-05 (×7): qty 2

## 2018-01-05 MED ORDER — TOPIRAMATE 100 MG PO TABS
200.0000 mg | ORAL_TABLET | Freq: Two times a day (BID) | ORAL | Status: DC
  Administered 2018-01-05 – 2018-01-10 (×10): 200 mg via ORAL
  Filled 2018-01-05 (×13): qty 2

## 2018-01-05 MED ORDER — METHYLPREDNISOLONE SODIUM SUCC 125 MG IJ SOLR
60.0000 mg | Freq: Four times a day (QID) | INTRAMUSCULAR | Status: DC
  Administered 2018-01-05 – 2018-01-06 (×3): 60 mg via INTRAVENOUS
  Filled 2018-01-05 (×3): qty 2

## 2018-01-05 MED ORDER — HEPARIN BOLUS VIA INFUSION
4000.0000 [IU] | Freq: Once | INTRAVENOUS | Status: AC
  Administered 2018-01-05: 4000 [IU] via INTRAVENOUS
  Filled 2018-01-05: qty 4000

## 2018-01-05 MED ORDER — ACETAMINOPHEN 650 MG RE SUPP: 650.0000 mg | Freq: Four times a day (QID) | RECTAL | Status: DC | PRN

## 2018-01-05 MED ORDER — SODIUM CHLORIDE 0.9 % IV SOLN: 250.0000 mL | INTRAVENOUS | Status: DC | PRN

## 2018-01-05 MED ORDER — BISACODYL 5 MG PO TBEC
5.0000 mg | DELAYED_RELEASE_TABLET | Freq: Every day | ORAL | Status: DC | PRN
  Administered 2018-01-08: 5 mg via ORAL
  Filled 2018-01-05: qty 1

## 2018-01-05 MED ORDER — ALBUTEROL SULFATE (2.5 MG/3ML) 0.083% IN NEBU: 2.5000 mg | INHALATION_SOLUTION | RESPIRATORY_TRACT | Status: DC | PRN

## 2018-01-05 MED ORDER — DILTIAZEM HCL ER 60 MG PO CP12
60.0000 mg | ORAL_CAPSULE | Freq: Every day | ORAL | Status: DC
  Administered 2018-01-06 – 2018-01-10 (×5): 60 mg via ORAL
  Filled 2018-01-05 (×6): qty 1

## 2018-01-05 MED ORDER — POTASSIUM CHLORIDE CRYS ER 20 MEQ PO TBCR
20.0000 meq | EXTENDED_RELEASE_TABLET | Freq: Every day | ORAL | Status: DC
  Administered 2018-01-06 – 2018-01-10 (×5): 20 meq via ORAL
  Filled 2018-01-05 (×5): qty 1

## 2018-01-05 MED ORDER — IPRATROPIUM-ALBUTEROL 0.5-2.5 (3) MG/3ML IN SOLN
3.0000 mL | Freq: Four times a day (QID) | RESPIRATORY_TRACT | Status: DC
  Administered 2018-01-05 – 2018-01-07 (×5): 3 mL via RESPIRATORY_TRACT
  Filled 2018-01-05 (×6): qty 3

## 2018-01-05 MED ORDER — PREGABALIN 75 MG PO CAPS
75.0000 mg | ORAL_CAPSULE | Freq: Two times a day (BID) | ORAL | Status: DC
  Administered 2018-01-05 – 2018-01-10 (×10): 75 mg via ORAL
  Filled 2018-01-05 (×10): qty 1

## 2018-01-05 MED ORDER — ACETAMINOPHEN 325 MG PO TABS: 650.0000 mg | ORAL_TABLET | Freq: Four times a day (QID) | ORAL | Status: DC | PRN

## 2018-01-05 MED ORDER — FERROUS SULFATE 325 (65 FE) MG PO TABS
325.0000 mg | ORAL_TABLET | Freq: Every day | ORAL | Status: DC
  Administered 2018-01-06 – 2018-01-09 (×4): 325 mg via ORAL
  Filled 2018-01-05 (×4): qty 1

## 2018-01-05 MED ORDER — SODIUM CHLORIDE 0.9% FLUSH
3.0000 mL | Freq: Two times a day (BID) | INTRAVENOUS | Status: DC
  Administered 2018-01-05 – 2018-01-10 (×9): 3 mL via INTRAVENOUS

## 2018-01-05 MED ORDER — LOSARTAN POTASSIUM 50 MG PO TABS
50.0000 mg | ORAL_TABLET | Freq: Every day | ORAL | Status: DC
  Administered 2018-01-06 – 2018-01-07 (×2): 50 mg via ORAL
  Filled 2018-01-05 (×2): qty 1

## 2018-01-05 MED ORDER — VITAMIN B-12 1000 MCG PO TABS
500.0000 ug | ORAL_TABLET | Freq: Every day | ORAL | Status: DC
  Administered 2018-01-06 – 2018-01-10 (×5): 500 ug via ORAL
  Filled 2018-01-05 (×5): qty 1

## 2018-01-05 MED ORDER — FUROSEMIDE 80 MG PO TABS
80.0000 mg | ORAL_TABLET | Freq: Every day | ORAL | Status: DC
  Administered 2018-01-06: 80 mg via ORAL
  Filled 2018-01-05: qty 1

## 2018-01-05 MED ORDER — SODIUM CHLORIDE 0.9% FLUSH
3.0000 mL | INTRAVENOUS | Status: DC | PRN
  Administered 2018-01-08: 3 mL via INTRAVENOUS
  Filled 2018-01-05: qty 3

## 2018-01-05 MED ORDER — IOHEXOL 350 MG/ML SOLN
75.0000 mL | Freq: Once | INTRAVENOUS | Status: AC | PRN
  Administered 2018-01-05: 75 mL via INTRAVENOUS

## 2018-01-05 MED ORDER — INSULIN ASPART 100 UNIT/ML ~~LOC~~ SOLN
0.0000 [IU] | SUBCUTANEOUS | Status: DC
  Administered 2018-01-05 – 2018-01-06 (×2): 2 [IU] via SUBCUTANEOUS
  Administered 2018-01-06: 3 [IU] via SUBCUTANEOUS
  Administered 2018-01-06 – 2018-01-07 (×2): 2 [IU] via SUBCUTANEOUS
  Filled 2018-01-05 (×5): qty 1

## 2018-01-05 MED ORDER — POTASSIUM CHLORIDE 10 MEQ/100ML IV SOLN
10.0000 meq | INTRAVENOUS | Status: AC
  Administered 2018-01-05 (×3): 10 meq via INTRAVENOUS
  Filled 2018-01-05 (×3): qty 100

## 2018-01-05 MED ORDER — ASPIRIN EC 325 MG PO TBEC: 325.0000 mg | DELAYED_RELEASE_TABLET | Freq: Every day | ORAL | Status: DC

## 2018-01-05 MED ORDER — INSULIN GLARGINE 100 UNIT/ML ~~LOC~~ SOLN
15.0000 [IU] | Freq: Every day | SUBCUTANEOUS | Status: DC
  Administered 2018-01-05 – 2018-01-08 (×4): 15 [IU] via SUBCUTANEOUS
  Filled 2018-01-05 (×4): qty 0.15

## 2018-01-05 MED ORDER — LAMOTRIGINE 100 MG PO TABS
200.0000 mg | ORAL_TABLET | Freq: Two times a day (BID) | ORAL | Status: DC
  Administered 2018-01-05 – 2018-01-10 (×10): 200 mg via ORAL
  Filled 2018-01-05 (×12): qty 2

## 2018-01-05 MED ORDER — POTASSIUM CHLORIDE CRYS ER 20 MEQ PO TBCR: 40.0000 meq | EXTENDED_RELEASE_TABLET | Freq: Once | ORAL | Status: DC

## 2018-01-05 MED ORDER — ONDANSETRON HCL 4 MG/2ML IJ SOLN: 4.0000 mg | Freq: Four times a day (QID) | INTRAMUSCULAR | Status: DC | PRN

## 2018-01-05 MED ORDER — ORAL CARE MOUTH RINSE: 15.0000 mL | Freq: Two times a day (BID) | OROMUCOSAL | Status: DC

## 2018-01-05 MED ORDER — BUDESONIDE 0.25 MG/2ML IN SUSP
0.2500 mg | Freq: Two times a day (BID) | RESPIRATORY_TRACT | Status: DC
  Administered 2018-01-05 – 2018-01-10 (×10): 0.25 mg via RESPIRATORY_TRACT
  Filled 2018-01-05 (×10): qty 2

## 2018-01-05 MED ORDER — ATORVASTATIN CALCIUM 20 MG PO TABS
40.0000 mg | ORAL_TABLET | Freq: Every day | ORAL | Status: DC
  Administered 2018-01-06 – 2018-01-09 (×4): 40 mg via ORAL
  Filled 2018-01-05 (×4): qty 2

## 2018-01-05 MED ORDER — IPRATROPIUM-ALBUTEROL 0.5-2.5 (3) MG/3ML IN SOLN: 3.0000 mL | Freq: Four times a day (QID) | RESPIRATORY_TRACT | Status: DC

## 2018-01-05 MED ORDER — TIOTROPIUM BROMIDE MONOHYDRATE 18 MCG IN CAPS
18.0000 ug | ORAL_CAPSULE | Freq: Every day | RESPIRATORY_TRACT | Status: DC
  Administered 2018-01-06 – 2018-01-10 (×4): 18 ug via RESPIRATORY_TRACT
  Filled 2018-01-05 (×2): qty 5

## 2018-01-05 NOTE — Progress Notes (Signed)
ANTICOAGULATION CONSULT NOTE - Initial Consult  Pharmacy Consult for Heparin Indication: chest pain/ACS  Allergies  Allergen Reactions  . Metformin Other (See Comments)    Other reaction(s): Other (See Comments) Constipation, dry mouth, dizziness  . Bismuth Subsalicylate Rash    Pepto Bismol    Patient Measurements: Height: 5\' 5"  (165.1 cm) Weight: 240 lb 4.8 oz (109 kg) IBW/kg (Calculated) : 57 Heparin Dosing Weight: 77.77  Vital Signs: Temp: 98.8 F (37.1 C) (11/14 1932) Temp Source: Oral (11/14 1932) BP: 143/88 (11/14 2200) Pulse Rate: 86 (11/14 2130)  Labs: Recent Labs    01/05/18 1324 01/05/18 1520 01/05/18 1720 01/05/18 2220  HGB 17.5*  --   --   --   HCT 59.0*  --   --   --   PLT 54*  --   --   --   APTT  --  27  --   --   LABPROT  --  14.4  --   --   INR  --  1.13  --   --   HEPARINUNFRC  --   --   --  0.30  CREATININE 1.04*  --   --   --   TROPONINI 1.78*  --  1.51*  --     Estimated Creatinine Clearance: 69.8 mL/min (A) (by C-G formula based on SCr of 1.04 mg/dL (H)).   Medical History: Past Medical History:  Diagnosis Date  . CHF (congestive heart failure) (HCC)   . COPD (chronic obstructive pulmonary disease) (HCC)   . Depression   . Diabetes mellitus without complication (HCC)   . Hypercholesteremia   . Hypertension   . Seizures (HCC)   . Stroke [facility])     Medications:  Scheduled:  Infusions:  PRN:   Assessment: The patient presented to ED with worsening shortness of breath this morning.  She denies any fever or chills, no chest pain or palpitation or leg edema.  She is found in respiratory distress, O2 saturation was 60s in room air in the ED.  She is put on nonrebreather and put on BiPAP due to hypercapnia with PCO2 84.  Chest x-ray did not show any infiltrate or pulmonary edema.  Troponin level is elevated at 1.78. Concern for NSTEMI. Last note of patient being on a DOAC (rivaroxaban) was in 2018. Platelets are low (54) - called Dr.  Imogene Burn and he is ok with continuing with the heparin due to trops being elevated (risk vs benefit).   Goal of Therapy:  Heparin level 0.3-0.7 units/ml Monitor platelets by anticoagulation protocol: Yes   Plan:  Give 4000 units bolus x 1 Start heparin infusion at 1000 units/hr Check anti-Xa level in 6 hours and daily while on heparin Continue to monitor H&H and platelets  11/14 PM heparin level 0.30. Continue current regimen. Recheck heparin level and CBC with tomorrow AM labs to confirm.  Haston Casebolt S, PharmD  [employer] 01/05/2018,10:58 PM

## 2018-01-05 NOTE — H&P (Addendum)
[facility] - [place] at [facility]   PATIENT NAME: Brenda Wu    MR#:  [id number]  DATE OF BIRTH:  05-22-1955  DATE OF ADMISSION:  01/05/2018  PRIMARY CARE PHYSICIAN: Cheron Schaumann., MD   REQUESTING/REFERRING PHYSICIAN: Dr. Scotty Court.  CHIEF COMPLAINT:   Chief Complaint  Patient presents with  . Respiratory Distress   Shortness of breath since this morning. HISTORY OF PRESENT ILLNESS:  Brenda Wu  is a 62 y.o. female with a known history of chronic diastolic CHF EF: 63% -   50%, COPD, hypertension, diabetes, hyperlipidemia, seizure disorder and stroke.  The patient presented to ED with worsening shortness of breath this morning.  She denies any fever or chills, no chest pain or palpitation or leg edema.  She is found in respiratory distress, O2 saturation was 60s in room air in the ED.  She is put on nonrebreather and put on BiPAP due to hypercapnia with PCO2 84.  Chest x-ray did not show any infiltrate or pulmonary edema.  Troponin level is elevated at 1.78.  PAST MEDICAL HISTORY:   Past Medical History:  Diagnosis Date  . CHF (congestive heart failure) (HCC)   . COPD (chronic obstructive pulmonary disease) (HCC)   . Depression   . Diabetes mellitus without complication (HCC)   . Hypercholesteremia   . Hypertension   . Seizures (HCC)   . Stroke [facility])     PAST SURGICAL HISTORY:   Past Surgical History:  Procedure Laterality Date  . REPLACEMENT TOTAL KNEE      SOCIAL HISTORY:   Social History   Tobacco Use  . Smoking status: Former Smoker    Packs/day: 1.00    Years: 30.00    Pack years: 30.00    Types: Cigarettes  . Smokeless tobacco: Never Used  Substance Use Topics  . Alcohol use: No    FAMILY HISTORY:   Family History  Problem Relation Age of Onset  . Hypertension Father   . Asthma Sister   . Heart murmur Sister   . Diabetes Sister     DRUG ALLERGIES:   Allergies  Allergen Reactions  . Metformin Other  (See Comments)    Other reaction(s): Other (See Comments) Constipation, dry mouth, dizziness  . Bismuth Subsalicylate Rash    Pepto Bismol    REVIEW OF SYSTEMS:   Review of Systems  Constitutional: Positive for malaise/fatigue. Negative for chills and fever.  HENT: Negative for sore throat.   Eyes: Negative for blurred vision and double vision.  Respiratory: Positive for cough, shortness of breath and wheezing. Negative for hemoptysis, sputum production and stridor.   Cardiovascular: Negative for chest pain, palpitations, orthopnea and leg swelling.  Gastrointestinal: Negative for abdominal pain, blood in stool, diarrhea, melena, nausea and vomiting.  Genitourinary: Negative for dysuria, flank pain and hematuria.  Musculoskeletal: Negative for back pain and joint pain.  Skin: Negative for rash.  Neurological: Negative for dizziness, sensory change, focal weakness, seizures, loss of consciousness, weakness and headaches.  Endo/Heme/Allergies: Negative for polydipsia.  Psychiatric/Behavioral: Negative for depression. The patient is not nervous/anxious.     MEDICATIONS AT HOME:   Prior to Admission medications   Medication Sig Start Date End Date Taking? Authorizing Provider  albuterol (PROVENTIL HFA;VENTOLIN HFA) 108 (90 Base) MCG/ACT inhaler Inhale 2 puffs into the lungs 4 (four) times daily as needed for wheezing or shortness of breath.  01/05/16   [provider]  albuterol (PROVENTIL) (2.5 MG/3ML) 0.083% nebulizer solution Inhale 2.5 mg  into the lungs every 6 (six) hours as needed for wheezing or shortness of breath.  10/01/16   [provider]  aspirin (GOODSENSE ASPIRIN) 325 MG tablet Take 325 mg by mouth daily. 01/31/08   [provider]  atorvastatin (LIPITOR) 40 MG tablet Take 40 mg by mouth daily.    [provider]  budesonide-formoterol (SYMBICORT) 160-4.5 MCG/ACT inhaler Inhale 2 puffs into the lungs 2 (two) times daily. 05/22/15   [provider]  diltiazem (CARDIZEM SR) 60 MG 12 hr capsule Take 60 mg by mouth daily.     [provider]  ferrous sulfate 325 (65 FE) MG tablet Take 325 mg by mouth daily with breakfast.    [provider]  furosemide (LASIX) 40 MG tablet Take 2 tablets (80 mg total) by mouth daily. 11/08/17 11/08/18  Salary, Evelena Asa, MD  glipiZIDE (GLUCOTROL) 5 MG tablet Take 5 mg by mouth 2 (two) times daily before a meal.     [provider]  Insulin Glargine (LANTUS SOLOSTAR) 100 UNIT/ML Solostar Pen Inject 15 Units into the skin daily. 01/21/17   [provider]  lamoTRIgine (LAMICTAL) 200 MG tablet Take 200 mg by mouth 2 (two) times daily.     [provider]  losartan (COZAAR) 50 MG tablet Take 50 mg by mouth daily.    [provider]  Multiple Vitamin (MULTI-VITAMINS) TABS Take 1 tablet by mouth daily.    [provider]  niacin 500 MG CR capsule Take 500 mg by mouth daily. 08/15/15   [provider]  potassium chloride SA (K-DUR,KLOR-CON) 20 MEQ tablet Take 1 tablet (20 mEq total) by mouth daily. 02/12/17   [name], MD  pregabalin (LYRICA) 75 MG capsule Take 75 mg by mouth 2 (two) times daily. 01/11/17 12/12/17  [provider]  sitaGLIPtin (JANUVIA) 50 MG tablet Take 50 mg by mouth daily.    [provider]  tiotropium (SPIRIVA) 18 MCG inhalation capsule Place 18 mcg into inhaler and inhale daily.    [provider]  topiramate (TOPAMAX) 200 MG tablet Take 200 mg by mouth 2 (two) times daily.    [provider]  vitamin B-12 (CYANOCOBALAMIN) 500 MCG tablet Take 500 mcg by mouth daily.    [provider]      VITAL SIGNS:  Blood pressure 137/85, pulse 68, resp. rate 14, height 5\' 2"  (1.575 m), weight 113.4 kg, SpO2 94 %.  PHYSICAL EXAMINATION:  Physical Exam  GENERAL:  62 y.o.-year-old patient lying in the bed with no acute distress.  Morbid obesity. EYES: Pupils equal,  round, reactive to light and accommodation. No scleral icterus. Extraocular muscles intact.  HEENT: Head atraumatic, normocephalic. Oropharynx and nasopharynx clear.  NECK:  Supple, no jugular venous distention. No thyroid enlargement, no tenderness.  LUNGS: Diminished breath sounds bilaterally, no wheezing, rales,rhonchi or crepitation. No use of accessory muscles of respiration.  CARDIOVASCULAR: S1, S2 normal. No murmurs, rubs, or gallops.  ABDOMEN: Soft, nontender, nondistended. Bowel sounds present. No organomegaly or mass.  EXTREMITIES: No pedal edema, cyanosis, or clubbing.  NEUROLOGIC: Cranial nerves II through XII are intact. Muscle strength 5/5 in all extremities. Sensation intact. Gait not checked.  PSYCHIATRIC: The patient is alert and oriented x 3.  SKIN: No obvious rash, lesion, or ulcer.   LABORATORY PANEL:   CBC No results for input(s): WBC, HGB, HCT, PLT in the last 168 hours. ------------------------------------------------------------------------------------------------------------------  Chemistries  Recent Labs  Lab 01/05/18 1324  NA 146*  K 3.3*  CL 102  CO2 33*  GLUCOSE 146*  BUN 13  CREATININE 1.04*  CALCIUM 9.0  AST 26  ALT 20  ALKPHOS 134*  BILITOT 1.4*   ------------------------------------------------------------------------------------------------------------------  Cardiac Enzymes Recent Labs  Lab 01/05/18 1324  TROPONINI 1.78*   ------------------------------------------------------------------------------------------------------------------  RADIOLOGY:  Dg Chest Portable 1 View  Result Date: 01/05/2018 CLINICAL DATA:  Difficulty breathing, respiratory distress EXAM: PORTABLE CHEST 1 VIEW COMPARISON:  Portable chest x-ray of 11/23/2016 FINDINGS: No pneumonia or pleural effusion is seen. There is mild linear atelectasis at the left mid lung base with mild volume loss at the right lung base as well. Mediastinal and hilar contours are stable  and moderate cardiomegaly is stable. No acute bony abnormality is seen. IMPRESSION: Mild basilar atelectasis. No pneumonia or pleural effusion. Stable cardiomegaly. Electronically Signed   By: Dwyane Dee M.D.   On: 01/05/2018 12:31      IMPRESSION AND PLAN:   Acute respiratory failure with hypoxia and hypercapnia due to COPD exacerbation. The patient will be admitted to stepdown unit. Continue BiPAP, DuoNeb every 6 hours, IV cell Medrol every 6 hours, Robitussin as needed.  Dulera and Spiriva.  Intensivist consult.  Elevated troponin, possible demanding ischemia, need to rule out non-STEMI Start heparin drip, aspirin and Lipitor, follow-up with troponin and cardiology consult.  Hypokalemia.  Give potassium supplement, follow-up level.  Hypernatremia.  Follow-up BMP.  Hypertension.  Continue hypertension medication. Diabetes.  Start sliding scale.  Hold glipizide, continue Lantus 15 unit subcu daily.  Chronic diastolic CHF ejection fraction 40 to 45%.  Stable.  Continue Lasix.  Morbid obesity and OSA.  CPAP at night.  Diet control and exercise.  Follow-up PCP.  Chronic thrombocytopenia.  Follow-up CBC while on heparin drip. I texted cardiologist, Dr. Vennie Homans associate will see the patient I discussed with ICU nurse practitioner Annabelle Harman and Dr. Allena Katz, intensivist. All the records are reviewed and case discussed with ED provider. Management plans discussed with the patient, family and they are in agreement.  CODE STATUS: Full code.  TOTAL TIME TAKING CARE OF THIS PATIENT: 42 minutes.    Shaune Pollack M.D on 01/05/2018 at 2:20 PM  Between 7am to 6pm - Pager - [phone]  After 6pm go to [email or web address] - Social research officer, government  [facility] [place] [facility]  Office  [phone]  CC: Primary care physician; Cheron Schaumann., MD   Note: This dictation was prepared with Dragon dictation along with smaller phrase technology. Any transcriptional errors that result from  this process are unin

## 2018-01-05 NOTE — Progress Notes (Signed)
Patient ABG showed not much improvement on current setting Patient is DNI Patient is awake and talking  Will adjust BIPAP  RPT gas around 8 pm   Virdie Penning [facility] - [facility] Pulmonary Critical Care & Sleep Medicine

## 2018-01-05 NOTE — Progress Notes (Signed)
Pt was transported to CT and tp CCU while on the bipap.

## 2018-01-05 NOTE — Consult Note (Signed)
Cardiology Consultation:   Patient ID: [name] MRN: [id number]; DOB: October 24, 1955  Admit date: 01/05/2018 Date of Consult: 01/05/2018  Primary Care Provider: Cheron Schaumann., MD Primary Cardiologist: New to [facility] Physician requesting consult: Dr. Roseanne Reno Reason for consult: Non-STEMI   Patient Profile:   Brenda Wu is a 62 y.o. female with a hx of severe COPD, former smoker 30 years , diastolic and systolic CHF, hypertension, diabetes, several recent hospitalizations for COPD exacerbation Presenting with hypoxia, lethargy, noted to have elevated troponin  History of Present Illness:   Patient is a poor historian, most of the history obtained from the notes No family at the bedside Notes indicating she was talking on the phone with her husband, he noted she was very short of breath, he was out of town for several days At home was found in respiratory distress, saturation 60% Brought in by EMS Placed on nonrebreather then transitioned to BiPAP ABG showing hypercapnia CO2 84 Chest x-ray with no significant pulmonary edema or infiltrate Initial troponin 1.78    Past Medical History:  Diagnosis Date  . CHF (congestive heart failure) (HCC)   . COPD (chronic obstructive pulmonary disease) (HCC)   . Depression   . Diabetes mellitus without complication (HCC)   . Hypercholesteremia   . Hypertension   . Seizures (HCC)   . Stroke [facility])     Past Surgical History:  Procedure Laterality Date  . REPLACEMENT TOTAL KNEE       Home Medications:  Prior to Admission medications   Medication Sig Start Date End Date Taking? Authorizing Provider  albuterol (PROVENTIL HFA;VENTOLIN HFA) 108 (90 Base) MCG/ACT inhaler Inhale 2 puffs into the lungs 4 (four) times daily as needed for wheezing or shortness of breath.  01/05/16  Yes [provider]  albuterol (PROVENTIL) (2.5 MG/3ML) 0.083% nebulizer solution Inhale 2.5 mg into the lungs every 6 (six) hours as  needed for wheezing or shortness of breath.  10/01/16  Yes [provider]  aspirin (GOODSENSE ASPIRIN) 325 MG tablet Take 325 mg by mouth daily. 01/31/08  Yes [provider]  atorvastatin (LIPITOR) 40 MG tablet Take 40 mg by mouth every evening.    Yes [provider]  budesonide-formoterol (SYMBICORT) 160-4.5 MCG/ACT inhaler Inhale 2 puffs into the lungs 2 (two) times daily. 05/22/15  Yes [provider]  diltiazem (CARDIZEM CD) 180 MG 24 hr capsule Take 180 mg by mouth daily.   Yes [provider]  ferrous sulfate 325 (65 FE) MG tablet Take 325 mg by mouth daily with breakfast.   Yes [provider]  furosemide (LASIX) 40 MG tablet Take 2 tablets (80 mg total) by mouth daily. 11/08/17 11/08/18 Yes Salary, Evelena Asa, MD  glipiZIDE (GLUCOTROL) 10 MG tablet Take 10 mg by mouth 2 (two) times daily before a meal.    Yes [provider]  guaiFENesin (MUCINEX) 600 MG 12 hr tablet Take 600 mg by mouth 2 (two) times daily.   Yes [provider]  Insulin Glargine (LANTUS SOLOSTAR) 100 UNIT/ML Solostar Pen Inject 15 Units into the skin daily. 01/21/17  Yes [provider]  lamoTRIgine (LAMICTAL) 200 MG tablet Take 200 mg by mouth 2 (two) times daily.    Yes [provider]  losartan (COZAAR) 50 MG tablet Take 50 mg by mouth daily.   Yes [provider]  meloxicam (MOBIC) 7.5 MG tablet Take 7.5 mg by mouth daily.   Yes [provider]  Multiple Vitamin (MULTI-VITAMINS) TABS  Take 1 tablet by mouth daily.   Yes [provider]  niacin 500 MG tablet Take 500 mg by mouth daily.   Yes [provider]  potassium chloride SA (K-DUR,KLOR-CON) 20 MEQ tablet Take 1 tablet (20 mEq total) by mouth daily. 02/12/17  Yes [name], MD  pregabalin (LYRICA) 75 MG capsule Take 75 mg by mouth 2 (two) times daily.   Yes [provider]  senna (SENOKOT) 8.6 MG TABS tablet Take 1 tablet by  mouth daily as needed for mild constipation or moderate constipation.   Yes [provider]  sitaGLIPtin (JANUVIA) 50 MG tablet Take 50 mg by mouth daily.   Yes [provider]  tiotropium (SPIRIVA) 18 MCG inhalation capsule Place 18 mcg into inhaler and inhale daily.   Yes [provider]  topiramate (TOPAMAX) 200 MG tablet Take 200 mg by mouth 2 (two) times daily.   Yes [provider]  vitamin B-12 (CYANOCOBALAMIN) 500 MCG tablet Take 500 mcg by mouth daily.   Yes [provider]    Inpatient Medications: Scheduled Meds: . [START ON 01/06/2018] aspirin EC  325 mg Oral Daily  . atorvastatin  40 mg Oral q1800  . budesonide (PULMICORT) nebulizer solution  0.25 mg Nebulization BID  . chlorhexidine  15 mL Mouth Rinse BID  . clopidogrel  75 mg Oral Daily  . diltiazem  60 mg Oral Daily  . [START ON 01/06/2018] ferrous sulfate  325 mg Oral Q breakfast  . [START ON 01/06/2018] furosemide  80 mg Oral Daily  . insulin aspart  0-15 Units Subcutaneous Q4H  . insulin glargine  15 Units Subcutaneous Daily  . ipratropium-albuterol  3 mL Nebulization Q6H  . lamoTRIgine  200 mg Oral BID  . [START ON 01/06/2018] losartan  50 mg Oral Daily  . [START ON 01/06/2018] mouth rinse  15 mL Mouth Rinse q12n4p  . methylPREDNISolone (SOLU-MEDROL) injection  60 mg Intravenous Q6H  . [START ON 01/06/2018] niacin  500 mg Oral Daily  . [START ON 01/06/2018] potassium chloride SA  20 mEq Oral Daily  . potassium chloride  40 mEq Oral Once  . pregabalin  75 mg Oral BID  . sodium chloride flush  3 mL Intravenous Q12H  . [START ON 01/06/2018] tiotropium  18 mcg Inhalation Daily  . topiramate  200 mg Oral BID  . [START ON 01/06/2018] vitamin B-12  500 mcg Oral Daily   Continuous Infusions: . sodium chloride    . heparin 1,000 Units/hr (01/05/18 1831)  . potassium chloride 100 mL/hr at 01/05/18 1831   PRN Meds: sodium chloride, acetaminophen **OR** acetaminophen,  albuterol, bisacodyl, guaiFENesin-dextromethorphan, HYDROcodone-acetaminophen, ondansetron **OR** ondansetron (ZOFRAN) IV, senna-docusate, sodium chloride flush  Allergies:    Allergies  Allergen Reactions  . Metformin Other (See Comments)    Other reaction(s): Other (See Comments) Constipation, dry mouth, dizziness  . Bismuth Subsalicylate Rash    Pepto Bismol    Social History:   Social History   Socioeconomic History  . Marital status: Married    Spouse name: Not on file  . Number of children: Not on file  . Years of education: Not on file  . Highest education level: Not on file  Occupational History  . Not on file  Social Needs  . Financial resource strain: Not on file  . Food insecurity:    Worry: Not on file    Inability: Not on file  . Transportation needs:    Medical: Not on file  Non-medical: Not on file  Tobacco Use  . Smoking status: Former Smoker    Packs/day: 1.00    Years: 30.00    Pack years: 30.00    Types: Cigarettes  . Smokeless tobacco: Never Used  Substance and Sexual Activity  . Alcohol use: No  . Drug use: No  . Sexual activity: Not on file  Lifestyle  . Physical activity:    Days per week: Not on file    Minutes per session: Not on file  . Stress: Not on file  Relationships  . Social connections:    Talks on phone: Not on file    Gets together: Not on file    Attends religious service: Not on file    Active member of club or organization: Not on file    Attends meetings of clubs or organizations: Not on file    Relationship status: Not on file  . Intimate partner violence:    Fear of current or ex partner: Not on file    Emotionally abused: Not on file    Physically abused: Not on file    Forced sexual activity: Not on file  Other Topics Concern  . Not on file  Social History Narrative  . Not on file    Family History:    Family History  Problem Relation Age of Onset  . Hypertension Father   . Asthma Sister   . Heart  murmur Sister   . Diabetes Sister      ROS:  Please see the history of present illness.  Review of Systems  Unable to perform ROS: Mental status change  Lethargic, on BiPAP, minimally arousable   Physical Exam/Data:   Vitals:   01/05/18 1645 01/05/18 1700 01/05/18 1746 01/05/18 1800  BP: (!) 150/95 (!) 149/95  (!) 145/92  Pulse: 77 77 69 72  Resp: 17 20 12 18   Temp: 97.9 F (36.6 C)     TempSrc: Oral     SpO2: 95% 94% 97% 97%  Weight: 109 kg     Height: 5\' 5"  (1.651 m)       Intake/Output Summary (Last 24 hours) at 01/05/2018 1852 Last data filed at 01/05/2018 1831 Gross per 24 hour  Intake 79.31 ml  Output 650 ml  Net -570.69 ml   Filed Weights   01/05/18 1209 01/05/18 1645  Weight: 113.4 kg 109 kg   Body mass index is 39.99 kg/m.  General: No distress, on BiPAP, minimally communicative, very lethargic HEENT: normal Lymph: no adenopathy Neck: no JVD Endocrine:  No thryomegaly Vascular: No carotid bruits; FA pulses 2+ bilaterally without bruits  Cardiac:  normal S1, S2; RRR; no murmur  Lungs: Moderately decreased breath sounds throughout Abd: soft, nontender, no hepatomegaly  Ext: no edema Musculoskeletal:  No deformities, grossly nonfocal Skin: warm and dry  Neuro: Unable to test, very lethargic Psych: Minimally communicative, falls right back to sleep  EKG:  The EKG was personally reviewed and demonstrates:   Shows normal sinus rhythm rate 71 bpm T wave abnormality precordial leads,   Telemetry:  Telemetry was personally reviewed and demonstrates: Normal sinus rhythm  Relevant CV Studies:  Echocardiogram November 05, 2017 Ejection fraction 45 to 50% Mild to moderate MR Mildly reduced RV function Mild to moderate TR  Laboratory Data:  Chemistry Recent Labs  Lab 01/05/18 1324  NA 146*  K 3.3*  CL 102  CO2 33*  GLUCOSE 146*  BUN 13  CREATININE 1.04*  CALCIUM 9.0  GFRNONAA 57*  GFRAA >60  ANIONGAP 11    Recent Labs  Lab  01/05/18 1324  PROT 6.8  ALBUMIN 4.1  AST 26  ALT 20  ALKPHOS 134*  BILITOT 1.4*   Hematology Recent Labs  Lab 01/05/18 1324  WBC 7.2  RBC 5.75*  HGB 17.5*  HCT 59.0*  MCV 102.6*  MCH 30.4  MCHC 29.7*  RDW 19.9*  PLT 54*   Cardiac Enzymes Recent Labs  Lab 01/05/18 1324 01/05/18 1720  TROPONINI 1.78* 1.51*   No results for input(s): TROPIPOC in the last 168 hours.  BNPNo results for input(s): BNP, PROBNP in the last 168 hours.  DDimer No results for input(s): DDIMER in the last 168 hours.  Radiology/Studies:  Ct Angio Chest Pe W Or Wo Contrast  Result Date: 01/05/2018 CLINICAL DATA:  62 y.o. female with a history of CHF COPD diabetes hypertension who comes to the ED due to respiratory distress. She is brought by EMS. Difficulty breathing this morning. O2 saturation in the 60s on room air. EXAM: CT ANGIOGRAPHY CHEST WITH CONTRAST TECHNIQUE: Multidetector CT imaging of the chest was performed using the standard protocol during bolus administration of intravenous contrast. Multiplanar CT image reconstructions and MIPs were obtained to evaluate the vascular anatomy. CONTRAST:  75mL OMNIPAQUE IOHEXOL 350 MG/ML SOLN COMPARISON:  Chest x-ray on 01/05/2018 FINDINGS: Cardiovascular: The heart is enlarged. Pulmonary arteries are moderately well opacified by contrast bolus. No acute pulmonary embolus. There is minimal atherosclerotic calcification of the coronary arteries. Thoracic aorta is not aneurysmal. There is minimal atherosclerotic calcification of the great vessels. Mediastinum/Nodes: The visualized portion of the thyroid gland has a normal appearance. No mediastinal, hilar, or axillary adenopathy. Lungs/Pleura: Emphysematous changes are identified in the lung apices. There is minimal bibasilar atelectasis, RIGHT greater than LEFT. Small RIGHT pleural effusion. No suspicious pulmonary nodules. No pulmonary edema or consolidation. Upper Abdomen: There is a small amount of ascites.  Musculoskeletal: There is deformity of the sternum consistent with remote fracture. Degenerative changes are seen in the thoracic spine. There is a chronic wedge compression fracture of T4. No suspicious lytic or blastic lesions are identified. Review of the MIP images confirms the above findings. IMPRESSION: 1. Emphysema Emphysema (ICD10-J43.9). 2. Small RIGHT pleural effusion and bibasilar atelectasis. 3. Cardiomegaly. 4. Technically adequate exam showing no acute pulmonary embolus. 5. Remote sternal fracture. Degenerative changes are seen in thoracic spine. Electronically Signed   By: Norva Pavlov M.D.   On: 01/05/2018 16:24   Dg Chest Portable 1 View  Result Date: 01/05/2018 CLINICAL DATA:  Difficulty breathing, respiratory distress EXAM: PORTABLE CHEST 1 VIEW COMPARISON:  Portable chest x-ray of 11/23/2016 FINDINGS: No pneumonia or pleural effusion is seen. There is mild linear atelectasis at the left mid lung base with mild volume loss at the right lung base as well. Mediastinal and hilar contours are stable and moderate cardiomegaly is stable. No acute bony abnormality is seen. IMPRESSION: Mild basilar atelectasis. No pneumonia or pleural effusion. Stable cardiomegaly. Electronically Signed   By: Dwyane Dee M.D.   On: 01/05/2018 12:31    Assessment and Plan:   1. Non-STEMI In the setting of profound hypoxia with hypercapnia, mental status changes Likely has underlying CAD given long smoking history, mildly depressed ejection fraction and other risk factors Currently not a good candidate for ischemic work-up -Continue heparin, trending cardiac enzymes Avoid beta-blockers given severe underlying lung disease Continue aspirin Could add long-acting nitro/or Nitropaste if blood pressure allows -Once mental status improves, respiratory status improves,  would have discussion with patient and other family concerning ischemic work-up  2) severe COPD Frequent COPD exacerbations over the past  month or so Several hospital admissions Managed by pulmonary  discharge November 25, 2017 for similar circumstances, respiratory distress/COPD  discharged November 09, 2017 for similar issues, respiratory distress/COPD Discharge February 12, 2017 COPD exacerbation  3) uncontrolled diabetes Previously exacerbated when she is on steroids Managed by primary care  4) sleep apnea Need to confirm compliance with CPAP  5) seizure disorder Previously on Lamictal and Topamax   Total encounter time more than 110 minutes  Greater than 50% was spent in counseling and coordination of care with the patient   For questions or updates, please contact [facility] Please consult [email or web address] for contact info under     Signed, Julien Nordmann, MD  01/05/2018 6:52 PM

## 2018-01-05 NOTE — Consult Note (Signed)
Lahoma Rocker, M.D Pulmonary Critical Care & Sleep Medicine   ICU Patient Progress Note  Name: Brenda Wu  DOB: [birth year]  MRN: [id number]  Date: 01/05/2018   '[x]'$ I have reviewed the flowsheet and previous day's notes.  IMPRESSION:  Patient Active Problem List   Diagnosis Date Noted  . Acute on chronic respiratory failure with hypoxia ([place]) 01/05/2018  . Chronic systolic heart failure ([place]) 12/12/2017  . HTN (hypertension) 12/12/2017  . Diabetes ([place]) 12/12/2017  . COPD exacerbation ([place]) 02/07/2017  . History of cerebellar stroke 03/12/2016  . History of seizures 03/12/2016  . Pulmonary nodule, left 01/05/2016  . Paroxysmal atrial fibrillation ([place]) 10/17/2015  . Cerebellar stroke ([place]) 06/26/2015  . Transient diplopia 06/09/2015  . Asthma-chronic obstructive pulmonary disease overlap syndrome ([place]) 05/30/2015  . COPD with asthma ([place]) 05/30/2015  . Hypoxemia 05/30/2015  . Obstructive sleep apnea syndrome 05/30/2015  . OSA on CPAP 05/30/2015  . Obesity (BMI 30-39.9) 03/12/2015  . Bilateral hearing loss 01/22/2015  . Generalized seizure disorder ([place]) 01/22/2015  . Hyperlipidemia associated with type 2 diabetes mellitus ([place]) 01/22/2015  . Hypertension associated with diabetes ([place]) 01/22/2015  . Morbid obesity due to excess calories ([place]) 01/22/2015  . Primary osteoarthritis involving multiple joints 01/22/2015  . Severe episode of recurrent major depressive disorder, without psychotic features ([place]) 01/22/2015   Hypercarbic hypoxic respiratory failure: Possible related to COPD exacerbation- Solu-Medrol 60 every 12, DuoNeb every 6, aspiration precaution, budesonide every 12/[place]  Non-STEMI with troponin I 0.78: Cardiology consult is called from ER Dr. Rockey Situ is supposed to see the patient today, patient is allergic to aspirin will start Plavix, continue with heparin monitor platelets as patient is already thrombocytopenic  Diabetes on home medication was treated  with Lantus on the last admission will put on sliding scale coverage especially as she is going to be on steroids  Right lower lobe airspace disease with mild pleural effusion: We will start Unasyn questionable aspiration  Cardiomyopathy: Echo in September showed EF of 45 to 50%-received Lasix-we will check BNP and monitor  History of seizures continue Topamax and lamotrigine  History of hyperlipidemia continue with atorvastatin  History of hypertension was on Cardizem adjust blood blood pressure and troponin we might need to switch it to metoprolol or other beta-blocker  CKD: Creatinine except acceptable with 1.04 monitor with CT angios  Thrombocytopenia was noted on last admission unsure of etiology monitor if continues to show deterioration consider hematology consultation  PLAN:  CVS: Serial cardiac enzymes echo, cardiology consult management as outlined above RS: Continue BiPAP for now-repeat ABG-discussed with the patient at length she does not want to be intubated if her condition gets worse but she tells me she always gets better on BiPAP -Get urine drug screen -- Aspiration precaution -- Vent protocol on vented patient  ID: Questionable right lower lobe pneumonia with some pleural effusion - Panculture -Start Unasyn -- Follow culture and adjust ENDO: Sliding scale coverage, hold home medications for now -- SSI monitor blood sugar GI: PPI start diet RENAL: Monitor, potassium was 3.3 -- Electrolyte replacement protocol, Monitor Cr and K  CNS: No acute issues mentation is fine HEMATOLOGY: Thrombocytopenia monitor on heparin -- Monitor HB and PLT MUSCULOSKELETAL: No acute issue PAIN AND SEDATION: Avoid pain medication  Skin/Wound: Chronic changes  Electrolytes: Replace electrolytes per ICU electrolyte replacement protocol.   IVF: none  Nutrition: Diet as tolerated  Prophylaxis: DVT Prophylaxis with on heparin drip,. GI Prophylaxis.   Restraints: None  PT/OT eval  and treat. OOB when appropriate.   Lines/Tubes: No Foley no central line. ADVANCE DIRECTIVE: Partial code okay with CPR no intubation FAMILY DISCUSSION: Spoke with the patient Quality Care: PPI, DVT prophylaxis, HOB elevated, Infection control all reviewed and addressed. Events and notes from last 24 hours reviewed. Care plan discussed on multidisciplinary rounds CC TIME: 35 minutes   Subjective: Brenda Wu  is a 62 y.o. female with a known history of chronic diastolic CHF EF: 27% - 06%, COPD, hypertension, diabetes, hyperlipidemia, seizure disorder and stroke.  The patient presented to ED with worsening shortness of breath this morning.  She denies any fever or chills, no chest pain or palpitation or leg edema.  She is found in respiratory distress, O2 saturation was 60s in room air in the ED.  She is put on nonrebreather and put on BiPAP due to hypercapnia with PCO2 84.  Chest x-ray did not show any infiltrate or pulmonary edema.  Troponin level is elevated at 1.78.  Review of chart suggested patient has severe COPD with FEV1 of 31% predicted - CTA chest done in the ER which did not reveal any evidence of PE showed some pleural effusion and emphysema   SIGNIFICANT STUDIES/EVENTS:  01/05/2018: Admission respiratory failure with BiPAP, CTA chest negative for PE troponin I 0.78   CULTURES:  Blood: 01/05/2018 Sputum: 01/05/2018   ANTIBIOTICS:  Unasyn     LINES/TUBES:  None  '[]'$ The patient is critically ill on     '[]'$ Mechanical ventilation '[]'$ Pressors  '[]'$ BiPAP '[]'$    MEDICATIONS: '@HOMEMEDS'$ @   clopidogrel 75 mg Daily    ALLERGIES: Allergies  Allergen Reactions  . Metformin Other (See Comments)    Other reaction(s): Other (See Comments) Constipation, dry mouth, dizziness  . Bismuth Subsalicylate Rash    Pepto Bismol    PAST MEDICAL HISTORY:   Past Medical History:  Diagnosis Date  . CHF (congestive heart failure) ([place])   . COPD (chronic obstructive pulmonary  disease) ([place])   . Depression   . Diabetes mellitus without complication ([place])   . Hypercholesteremia   . Hypertension   . Seizures ([place])   . Stroke [facility])     PAST SURGICAL HISTORY: Past Surgical History:  Procedure Laterality Date  . REPLACEMENT TOTAL KNEE      PAST SOCIAL HISTORY: Social History   Socioeconomic History  . Marital status: Married    Spouse name: Not on file  . Number of children: Not on file  . Years of education: Not on file  . Highest education level: Not on file  Occupational History  . Not on file  Social Needs  . Financial resource strain: Not on file  . Food insecurity:    Worry: Not on file    Inability: Not on file  . Transportation needs:    Medical: Not on file    Non-medical: Not on file  Tobacco Use  . Smoking status: Former Smoker    Packs/day: 1.00    Years: 30.00    Pack years: 30.00    Types: Cigarettes  . Smokeless tobacco: Never Used  Substance and Sexual Activity  . Alcohol use: No  . Drug use: No  . Sexual activity: Not on file  Lifestyle  . Physical activity:    Days per week: Not on file    Minutes per session: Not on file  . Stress: Not on file  Relationships  . Social connections:    Talks on phone: Not on file    Gets together:  Not on file    Attends religious service: Not on file    Active member of club or organization: Not on file    Attends meetings of clubs or organizations: Not on file    Relationship status: Not on file  . Intimate partner violence:    Fear of current or ex partner: Not on file    Emotionally abused: Not on file    Physically abused: Not on file    Forced sexual activity: Not on file  Other Topics Concern  . Not on file  Social History Narrative  . Not on file    FAMILY HISTORY: Family History  Problem Relation Age of Onset  . Hypertension Father   . Asthma Sister   . Heart murmur Sister   . Diabetes Sister     Vital Signs:   BP (!) 150/95 (BP Location: Left Arm)   Pulse  77   Temp 97.9 F (36.6 C) (Oral)   Resp 17   Ht '5\' 5"'$  (1.651 m)   Wt 109 kg   SpO2 95%   BMI 39.99 kg/m       Prehospital Care Cardiac Rhythm: Normal sinus rhythm  Temp (24hrs), Avg:97.7 F (36.5 C), Min:97.5 F (36.4 C), Max:97.9 F (36.6 C)   Intake/Output:Last shift:      11/14 0701 - 11/14 1900 In: -  Out: 300 [Urine:300]Last 3 shifts: No intake/output data recorded.  Intake/Output Summary (Last 24 hours) at 01/05/2018 1704 Last data filed at 01/05/2018 1556 Gross per 24 hour  Intake -  Output 300 ml  Net -300 ml   Hemodynamics:  .'@MAP'$     .'@CVP'$      Ventilator Settings:          Mode Rate Tidal Volume Pressure FiO2 PEEP                 Peak airway pressure:     Minute ventilation:      ARDS network Guidelines: Lung protective strategy and Pl pressure goals<30  Objective:   Physical Exam:  General: comfortable, no acute distress currently on BiPAP HEENT: pupils reactive, sclera anicteric, EOM intact Neck: No adenopathy or thyroid swelling, no lymphadenopathy or JVD, supple CVS: S1S2 no murmurs RS: Mod AE bilaterally, sedentary at the base with some rales Abd: soft, non tender, no hepatosplenomegaly Neuro: non focal, awake, alert Extrm: Bilateral leg edema with chronic changes Lymph node: No obvious palpable lymph node appreciated. Skin: no rash   Labs:  CBC w/Diff Recent Labs  Lab 01/05/18 1324  HGB 17.5*  HCT 59.0*  WBC 7.2  PLT 54*     Chemistry Recent Labs  Lab 01/05/18 1324  NA 146*  K 3.3*  CL 102  CO2 33*  BUN 13  CREATININE 1.04*  GLUCOSE 146*    Hepatic Function Latest Ref Rng & Units 01/05/2018 11/23/2017 11/04/2017  Total Protein 6.5 - 8.1 g/dL 6.8 6.6 6.7  Albumin 3.5 - 5.0 g/dL 4.1 4.0 3.9  AST 15 - 41 U/L 26 33 24  ALT 0 - 44 U/L 20 40 20  Alk Phosphatase 38 - 126 U/L 134(H) 119 110  Total Bilirubin 0.3 - 1.2 mg/dL 1.4(H) 1.0 1.1     Lactic Acid No results found for: LATICACIDVEN   Micro  '@MICRO'$ @ No results  found for this or any previous visit (from the past 720 hour(s)).   ABG    Component Value Date/Time   PHART 7.33 (L) 11/06/2017 0500   PCO2ART 71 (HH)  11/06/2017 0500   PO2ART 57 (L) 11/06/2017 0500   HCO3 34.4 (H) 01/05/2018 1212   O2SAT 99.2 01/05/2018 1212     Cardiac Enzymes Recent Labs    01/05/18 1324  TROPONINI 1.78*      Coagulation Lab Results  Component Value Date   INR 1.13 01/05/2018   APTT 27 01/05/2018     Radiology Results Reviewed by me and compared with previous imaging studies Ct Angio Chest Pe W Or Wo Contrast  Result Date: 01/05/2018 CLINICAL DATA:  62 y.o. female with a history of CHF COPD diabetes hypertension who comes to the ED due to respiratory distress. She is brought by EMS. Difficulty breathing this morning. O2 saturation in the 60s on room air. EXAM: CT ANGIOGRAPHY CHEST WITH CONTRAST TECHNIQUE: Multidetector CT imaging of the chest was performed using the standard protocol during bolus administration of intravenous contrast. Multiplanar CT image reconstructions and MIPs were obtained to evaluate the vascular anatomy. CONTRAST:  67m OMNIPAQUE IOHEXOL 350 MG/ML SOLN COMPARISON:  Chest x-ray on 01/05/2018 FINDINGS: Cardiovascular: The heart is enlarged. Pulmonary arteries are moderately well opacified by contrast bolus. No acute pulmonary embolus. There is minimal atherosclerotic calcification of the coronary arteries. Thoracic aorta is not aneurysmal. There is minimal atherosclerotic calcification of the great vessels. Mediastinum/Nodes: The visualized portion of the thyroid gland has a normal appearance. No mediastinal, hilar, or axillary adenopathy. Lungs/Pleura: Emphysematous changes are identified in the lung apices. There is minimal bibasilar atelectasis, RIGHT greater than LEFT. Small RIGHT pleural effusion. No suspicious pulmonary nodules. No pulmonary edema or consolidation. Upper Abdomen: There is a small amount of ascites. Musculoskeletal: There is  deformity of the sternum consistent with remote fracture. Degenerative changes are seen in the thoracic spine. There is a chronic wedge compression fracture of T4. No suspicious lytic or blastic lesions are identified. Review of the MIP images confirms the above findings. IMPRESSION: 1. Emphysema Emphysema (ICD10-J43.9). 2. Small RIGHT pleural effusion and bibasilar atelectasis. 3. Cardiomegaly. 4. Technically adequate exam showing no acute pulmonary embolus. 5. Remote sternal fracture. Degenerative changes are seen in thoracic spine. Electronically Signed   By: ENolon NationsM.D.   On: 01/05/2018 16:24   Dg Chest Portable 1 View  Result Date: 01/05/2018 CLINICAL DATA:  Difficulty breathing, respiratory distress EXAM: PORTABLE CHEST 1 VIEW COMPARISON:  Portable chest x-ray of 11/23/2016 FINDINGS: No pneumonia or pleural effusion is seen. There is mild linear atelectasis at the left mid lung base with mild volume loss at the right lung base as well. Mediastinal and hilar contours are stable and moderate cardiomegaly is stable. No acute bony abnormality is seen. IMPRESSION: Mild basilar atelectasis. No pneumonia or pleural effusion. Stable cardiomegaly. Electronically Signed   By: PIvar DrapeM.D.   On: 01/05/2018 12:31      ECHO    RLahoma Rocker MD 01/05/2018 '@NOWR'$ @   The patient is: '[x]'$  acutely ill Risk of deterioration: '[]'$  moderate   '[x]'$  critically ill  '[]'$  high   '[x]'$ See my orders for details  My assessment, plan of care, findings, medications, side effects etc were discussed with: '[x]'$ nursing '[]'$ PT/OT   '[x]'$ respiratory therapy '[]'$ Dr.  '[]'$ family '[]'$    RArliss Journey M.D. Pulmonary Critical Care & Sleep Medicine

## 2018-01-05 NOTE — ED Triage Notes (Signed)
Patient from home via [facility]. Per EMS, patient was talking to husband on the phone when he noticed she was having difficulty breathing. Upon EMS arrival, patient was alert however having significant respiratory distress. O2 sat in the 60s on room air. Upon arrival, patient 99% on non-rebreather at 15L. Patient alert to verbal stimulation, and following commands, however decreased responsiveness noted. MD at bedside. History of CHF.

## 2018-01-05 NOTE — Progress Notes (Signed)
Advanced Care Plan.  Purpose of Encounter: CODE STATUS. Parties in Attendance: The patient and me. Patient's Decisional Capacity: Yes. Medical Story: Brenda Wu  is a 62 y.o. female with a known history of chronic diastolic CHF EF: 48% - 50%, COPD, hypertension, diabetes, hyperlipidemia, seizure disorder and stroke.  The patient is being admitted for Goals of Care Determinations: Acute respiratory failure with hypoxia and hypercapnia due to COPD exacerbation, elevated troponin possible non-STEMI.  I discussed with the patient about her current critical condition, prognosis and CODE STATUS.  She stated that she wants to be resuscitated and intubated if she has a cardiopulmonary arrest. Plan:  Code Status: Full code Time spent discussing advance care planning: 18 minutes.

## 2018-01-05 NOTE — Progress Notes (Addendum)
ANTICOAGULATION CONSULT NOTE - Initial Consult  Pharmacy Consult for Heparin Indication: chest pain/ACS  Allergies  Allergen Reactions  . Metformin Other (See Comments)    Other reaction(s): Other (See Comments) Constipation, dry mouth, dizziness  . Bismuth Subsalicylate Rash    Pepto Bismol    Patient Measurements: Height: 5\' 2"  (157.5 cm) Weight: 250 lb (113.4 kg) IBW/kg (Calculated) : 50.1 Heparin Dosing Weight: 77.77  Vital Signs: BP: 137/85 (11/14 1400) Pulse Rate: 68 (11/14 1400)  Labs: Recent Labs    01/05/18 1324  HGB 17.5*  HCT 59.0*  PLT 54*  CREATININE 1.04*  TROPONINI 1.78*    Estimated Creatinine Clearance: 67.6 mL/min (A) (by C-G formula based on SCr of 1.04 mg/dL (H)).   Medical History: Past Medical History:  Diagnosis Date  . CHF (congestive heart failure) ([facility])   . COPD (chronic obstructive pulmonary disease) ([facility])   . Depression   . Diabetes mellitus without complication ([facility])   . Hypercholesteremia   . Hypertension   . Seizures ([facility])   . Stroke [facility])     Medications:  Scheduled:  Infusions:  PRN:   Assessment: The patient presented to ED with worsening shortness of breath this morning.  She denies any fever or chills, no chest pain or palpitation or leg edema.  She is found in respiratory distress, O2 saturation was 60s in room air in the ED.  She is put on nonrebreather and put on BiPAP due to hypercapnia with PCO2 84.  Chest x-ray did not show any infiltrate or pulmonary edema.  Troponin level is elevated at 1.78. Concern for NSTEMI. Last note of patient being on a DOAC (rivaroxaban) was in 2018. Platelets are low (54) - called Dr. Imogene Burn and he is ok with continuing with the heparin due to trops being elevated (risk vs benefit).   Goal of Therapy:  Heparin level 0.3-0.7 units/ml Monitor platelets by anticoagulation protocol: Yes   Plan:  Give 4000 units bolus x 1 Start heparin infusion at 1000 units/hr Check anti-Xa level in 6  hours and daily while on heparin Continue to monitor H&H and platelets  Ronnald Ramp, PharmD  [employer] 01/05/2018,2:39 PM

## 2018-01-05 NOTE — ED Provider Notes (Signed)
[facility] Emergency Department Provider Note  ____________________________________________  Time seen: Approximately 12:24 PM  I have reviewed the triage vital signs and the nursing notes.   HISTORY  Chief Complaint Respiratory Distress  Level 5 Caveat: Portions of the History and Physical including HPI and review of systems are unable to be completely obtained due to patient being a poor historian due to acute critical illness   HPI Shawntelle Ungar is a 62 y.o. female with a history of CHF COPD diabetes hypertension who comes to the ED due to respiratory distress.  She is brought by EMS.  Her husband has been out of town for the past few days, and this morning she was talking to him on the phone when he noticed she was having a hard time breathing.  EMS report that on their arrival the patient was not wearing her oxygen nasal cannula, was in respiratory distress, O2 saturation in the 60s on room air.  They put the patient on nonrebreather which increased her oxygenation to 99%.      Past Medical History:  Diagnosis Date  . CHF (congestive heart failure) (HCC)   . COPD (chronic obstructive pulmonary disease) (HCC)   . Depression   . Diabetes mellitus without complication (HCC)   . Hypercholesteremia   . Hypertension   . Seizures (HCC)   . Stroke [facility])      Patient Active Problem List   Diagnosis Date Noted  . Acute on chronic respiratory failure with hypoxia (HCC) 01/05/2018  . Chronic systolic heart failure (HCC) 12/12/2017  . HTN (hypertension) 12/12/2017  . Diabetes (HCC) 12/12/2017  . COPD exacerbation (HCC) 02/07/2017  . History of cerebellar stroke 03/12/2016  . History of seizures 03/12/2016  . Pulmonary nodule, left 01/05/2016  . Paroxysmal atrial fibrillation (HCC) 10/17/2015  . Cerebellar stroke (HCC) 06/26/2015  . Transient diplopia 06/09/2015  . Asthma-chronic obstructive pulmonary disease overlap syndrome (HCC) 05/30/2015  . COPD  with asthma (HCC) 05/30/2015  . Hypoxemia 05/30/2015  . Obstructive sleep apnea syndrome 05/30/2015  . OSA on CPAP 05/30/2015  . Obesity (BMI 30-39.9) 03/12/2015  . Bilateral hearing loss 01/22/2015  . Generalized seizure disorder (HCC) 01/22/2015  . Hyperlipidemia associated with type 2 diabetes mellitus (HCC) 01/22/2015  . Hypertension associated with diabetes (HCC) 01/22/2015  . Morbid obesity due to excess calories (HCC) 01/22/2015  . Primary osteoarthritis involving multiple joints 01/22/2015  . Severe episode of recurrent major depressive disorder, without psychotic features (HCC) 01/22/2015     Past Surgical History:  Procedure Laterality Date  . REPLACEMENT TOTAL KNEE       Prior to Admission medications   Medication Sig Start Date End Date Taking? Authorizing Provider  albuterol (PROVENTIL HFA;VENTOLIN HFA) 108 (90 Base) MCG/ACT inhaler Inhale 2 puffs into the lungs 4 (four) times daily as needed for wheezing or shortness of breath.  01/05/16   [provider]  albuterol (PROVENTIL) (2.5 MG/3ML) 0.083% nebulizer solution Inhale 2.5 mg into the lungs every 6 (six) hours as needed for wheezing or shortness of breath.  10/01/16   [provider]  aspirin (GOODSENSE ASPIRIN) 325 MG tablet Take 325 mg by mouth daily. 01/31/08   [provider]  atorvastatin (LIPITOR) 40 MG tablet Take 40 mg by mouth daily.    [provider]  budesonide-formoterol (SYMBICORT) 160-4.5 MCG/ACT inhaler Inhale 2 puffs into the lungs 2 (two) times daily. 05/22/15   [provider]  diltiazem (CARDIZEM SR) 60 MG 12 hr capsule Take  60 mg by mouth daily.     [provider]  ferrous sulfate 325 (65 FE) MG tablet Take 325 mg by mouth daily with breakfast.    [provider]  furosemide (LASIX) 40 MG tablet Take 2 tablets (80 mg total) by mouth daily. 11/08/17 11/08/18  Salary, Evelena Asa, MD  glipiZIDE (GLUCOTROL) 5 MG tablet Take 5 mg by mouth 2  (two) times daily before a meal.     [provider]  Insulin Glargine (LANTUS SOLOSTAR) 100 UNIT/ML Solostar Pen Inject 15 Units into the skin daily. 01/21/17   [provider]  lamoTRIgine (LAMICTAL) 200 MG tablet Take 200 mg by mouth 2 (two) times daily.     [provider]  losartan (COZAAR) 50 MG tablet Take 50 mg by mouth daily.    [provider]  Multiple Vitamin (MULTI-VITAMINS) TABS Take 1 tablet by mouth daily.    [provider]  niacin 500 MG CR capsule Take 500 mg by mouth daily. 08/15/15   [provider]  potassium chloride SA (K-DUR,KLOR-CON) 20 MEQ tablet Take 1 tablet (20 mEq total) by mouth daily. 02/12/17   [name], MD  pregabalin (LYRICA) 75 MG capsule Take 75 mg by mouth 2 (two) times daily. 01/11/17 12/12/17  [provider]  sitaGLIPtin (JANUVIA) 50 MG tablet Take 50 mg by mouth daily.    [provider]  tiotropium (SPIRIVA) 18 MCG inhalation capsule Place 18 mcg into inhaler and inhale daily.    [provider]  topiramate (TOPAMAX) 200 MG tablet Take 200 mg by mouth 2 (two) times daily.    [provider]  vitamin B-12 (CYANOCOBALAMIN) 500 MCG tablet Take 500 mcg by mouth daily.    [provider]     Allergies Metformin and Bismuth subsalicylate   Family History  Problem Relation Age of Onset  . Hypertension Father   . Asthma Sister   . Heart murmur Sister   . Diabetes Sister     Social History Social History   Tobacco Use  . Smoking status: Former Smoker    Packs/day: 1.00    Years: 30.00    Pack years: 30.00    Types: Cigarettes  . Smokeless tobacco: Never Used  Substance Use Topics  . Alcohol use: No  . Drug use: No    Review of Systems  Constitutional:   No fever or chills.  Cardiovascular:   No chest pain or syncope. Respiratory:   Positive shortness of breath, no cough.  Musculoskeletal:   Positive bilateral lower leg  swelling All other systems reviewed and are negative except as documented above in ROS and HPI.  ____________________________________________   PHYSICAL EXAM:  VITAL SIGNS: ED Triage Vitals  Enc Vitals Group     BP 01/05/18 1208 134/85     Pulse Rate 01/05/18 1208 71     Resp 01/05/18 1208 14     Temp --      Temp src --      SpO2 01/05/18 1208 99 %     Weight 01/05/18 1209 250 lb (113.4 kg)     Height 01/05/18 1209 5\' 2"  (1.575 m)     Head Circumference --      Peak Flow --      Pain Score 01/05/18 1209 0     Pain Loc --      Pain Edu? --      Excl. in GC? --     Vital signs reviewed,  nursing assessments reviewed.   Constitutional:   Awake, alert and oriented.  Ill-appearing.  Morbidly obese Eyes:   Conjunctivae are normal. EOMI. PERRL, mydriatic. ENT      Head:   Normocephalic and atraumatic.      Nose:   No congestion/rhinnorhea.       Mouth/Throat:   MMM, no pharyngeal erythema. No peritonsillar mass.       Neck:   No meningismus. Full ROM. Hematological/Lymphatic/Immunilogical:   No cervical lymphadenopathy. Cardiovascular:   RRR. Symmetric bilateral radial and DP pulses.  No murmurs. Cap refill less than 2 seconds. Respiratory:   Normal respiratory effort without tachypnea/retractions.  Diffuse inspiratory crackles and expiratory wheezing.  Diminished breath sounds in all lung fields. Gastrointestinal: Obese, soft and nontender. Non distended. There is no CVA tenderness.  No rebound, rigidity, or guarding.  Musculoskeletal:   Normal range of motion in all extremities. No joint effusions.  No lower extremity tenderness.  2+ pitting edema bilaterally Neurologic:   Minimal verbal responses.  Motor grossly intact. No acute focal neurologic deficits are appreciated.  Skin:    Skin is warm, dry and intact. No rash noted.  No petechiae, purpura, or bullae.  ____________________________________________    LABS (pertinent positives/negatives) (all labs ordered are  listed, but only abnormal results are displayed) Labs Reviewed  BLOOD GAS, VENOUS - Abnormal; Notable for the following components:      Result Value   pH, Ven 7.22 (*)    pCO2, Ven 84 (*)    pO2, Ven 162.0 (*)    Bicarbonate 34.4 (*)    Acid-Base Excess 2.9 (*)    All other components within normal limits  CBC WITH DIFFERENTIAL/PLATELET  CBC WITH DIFFERENTIAL/PLATELET  COMPREHENSIVE METABOLIC PANEL  TROPONIN I   ____________________________________________   EKG  Interpreted by me Sinus rhythm rate of 71, normal axis .  Poor R wave progression.  Normal ST segments and T waves.  No acute ischemic changes. QTC of 567 ms.  ____________________________________________    RADIOLOGY  Dg Chest Portable 1 View  Result Date: 01/05/2018 CLINICAL DATA:  Difficulty breathing, respiratory distress EXAM: PORTABLE CHEST 1 VIEW COMPARISON:  Portable chest x-ray of 11/23/2016 FINDINGS: No pneumonia or pleural effusion is seen. There is mild linear atelectasis at the left mid lung base with mild volume loss at the right lung base as well. Mediastinal and hilar contours are stable and moderate cardiomegaly is stable. No acute bony abnormality is seen. IMPRESSION: Mild basilar atelectasis. No pneumonia or pleural effusion. Stable cardiomegaly. Electronically Signed   By: Dwyane Dee M.D.   On: 01/05/2018 12:31    ____________________________________________   PROCEDURES .Critical Care Performed by: Sharman Cheek, MD Authorized by: Sharman Cheek, MD   Critical care provider statement:    Critical care time (minutes):  35   Critical care time was exclusive of:  Separately billable procedures and treating other patients   Critical care was necessary to treat or prevent imminent or life-threatening deterioration of the following conditions:  Respiratory failure   Critical care was time spent personally by me on the following activities:  Development of treatment plan with patient or  surrogate, discussions with consultants, evaluation of patient's response to treatment, examination of patient, obtaining history from patient or surrogate, ordering and performing treatments and interventions, ordering and review of laboratory studies, ordering and review of radiographic studies, pulse oximetry, re-evaluation of patient's condition and review of old charts    ____________________________________________  DIFFERENTIAL DIAGNOSIS   Pulmonary edema/CHF  exacerbation, COPD exacerbation, pneumonia, CO2 narcosis.  Less likely stroke, PE, or electrolyte disturbance or ACS.  Doubt sepsis  CLINICAL IMPRESSION / ASSESSMENT AND PLAN / ED COURSE  Pertinent labs & imaging results that were available during my care of the patient were reviewed by me and considered in my medical decision making (see chart for details).      Clinical Course as of Jan 06 1356  Thu Jan 05, 2018  1205 Presents with respiratory distress.  Room air hypoxia to the 60s at home by EMS.  Sat in the 90s on a nonrebreather.  Exam is concerning for pulmonary edema.  She is awake and following commands.  Will start BiPAP, obtain labs and chest x-ray.   [PS]  1300 Mental status improved.  Symptoms improving on BiPAP, maintaining O2 sat 92% on 30% FiO2, but with her pronounced respiratory acidosis I will continue BiPAP and discussed with hospitalist for admission.   [PS]    Clinical Course User Index [PS] Sharman Cheek, MD     ____________________________________________   FINAL CLINICAL IMPRESSION(S) / ED DIAGNOSES    Final diagnoses:  Acute respiratory failure with hypoxia and hypercapnia (HCC)  CO2 narcosis     ED Discharge Orders    None      Portions of this note were generated with dragon dictation software. Dictation errors may occur despite best attempts at proofreading.    Sharman Cheek, MD 01/05/18 1357

## 2018-01-06 ENCOUNTER — Inpatient Hospital Stay: Payer: Medicare HMO

## 2018-01-06 ENCOUNTER — Inpatient Hospital Stay
Admit: 2018-01-06 | Discharge: 2018-01-06 | Disposition: A | Discharge status: Home / Self Care | Payer: Medicare HMO | Attending: Pulmonary Disease | Admitting: Pulmonary Disease

## 2018-01-06 DIAGNOSIS — I370 Nonrheumatic pulmonary valve stenosis: Secondary | ICD-10-CM

## 2018-01-06 DIAGNOSIS — I361 Nonrheumatic tricuspid (valve) insufficiency: Secondary | ICD-10-CM

## 2018-01-06 DIAGNOSIS — I214 Non-ST elevation (NSTEMI) myocardial infarction: Secondary | ICD-10-CM

## 2018-01-06 DIAGNOSIS — I340 Nonrheumatic mitral (valve) insufficiency: Secondary | ICD-10-CM

## 2018-01-06 LAB — COMPREHENSIVE METABOLIC PANEL
ALK PHOS: 117 U/L (ref 38–126)
ALT: 18 U/L (ref 0–44)
AST: 20 U/L (ref 15–41)
Albumin: 3.4 g/dL — ABNORMAL LOW (ref 3.5–5.0)
Anion gap: 13 (ref 5–15)
BUN: 13 mg/dL (ref 8–23)
CALCIUM: 8.8 mg/dL — AB (ref 8.9–10.3)
CO2: 30 mmol/L (ref 22–32)
CREATININE: 1.06 mg/dL — AB (ref 0.44–1.00)
Chloride: 102 mmol/L (ref 98–111)
GFR calc non Af Amer: 55 mL/min — ABNORMAL LOW (ref >60)
Glucose, Bld: 118 mg/dL — ABNORMAL HIGH (ref 70–99)
Potassium: 3.9 mmol/L (ref 3.5–5.1)
SODIUM: 145 mmol/L (ref 135–145)
Total Bilirubin: 1.6 mg/dL — ABNORMAL HIGH (ref 0.3–1.2)
Total Protein: 6.2 g/dL — ABNORMAL LOW (ref 6.5–8.1)

## 2018-01-06 LAB — GLUCOSE, CAPILLARY
GLUCOSE-CAPILLARY: 176 mg/dL — AB (ref 70–99)
GLUCOSE-CAPILLARY: 125 mg/dL — AB (ref 70–99)
GLUCOSE-CAPILLARY: 151 mg/dL — AB (ref 70–99)
Glucose-Capillary: 120 mg/dL — ABNORMAL HIGH (ref 70–99)
Glucose-Capillary: 111 mg/dL — ABNORMAL HIGH (ref 70–99)

## 2018-01-06 LAB — BLOOD GAS, ARTERIAL
Acid-Base Excess: 4.7 mmol/L — ABNORMAL HIGH (ref 0.0–2.0)
Bicarbonate: 33.5 mmol/L — ABNORMAL HIGH (ref 20.0–28.0)
FIO2: 0.28
O2 Saturation: 81.6 %
PCO2 ART: 65 mmHg — AB (ref 32.0–48.0)
PH ART: 7.32 — AB (ref 7.350–7.450)
PO2 ART: 50 mmHg — AB (ref 83.0–108.0)
Patient temperature: 37

## 2018-01-06 LAB — CBC WITH DIFFERENTIAL/PLATELET
ABS IMMATURE GRANULOCYTES: 0.02 10*3/uL (ref 0.00–0.07)
Basophils Absolute: 0 10*3/uL (ref 0.0–0.1)
Basophils Relative: 0 %
Eosinophils Absolute: 0 10*3/uL (ref 0.0–0.5)
Eosinophils Relative: 0 %
HCT: 56.3 % — ABNORMAL HIGH (ref 36.0–46.0)
HEMOGLOBIN: 16.6 g/dL — AB (ref 12.0–15.0)
Immature Granulocytes: 1 %
LYMPHS ABS: 0.5 10*3/uL — AB (ref 0.7–4.0)
LYMPHS PCT: 12 %
MCH: 30.2 pg (ref 26.0–34.0)
MCHC: 29.5 g/dL — ABNORMAL LOW (ref 30.0–36.0)
MCV: 102.4 fL — AB (ref 80.0–100.0)
MONOS PCT: 1 %
Monocytes Absolute: 0.1 10*3/uL (ref 0.1–1.0)
NEUTROS ABS: 3.7 10*3/uL (ref 1.7–7.7)
Neutrophils Relative %: 86 %
Platelets: 94 10*3/uL — ABNORMAL LOW (ref 150–400)
RBC: 5.5 MIL/uL — AB (ref 3.87–5.11)
RDW: 19.4 % — ABNORMAL HIGH (ref 11.5–15.5)
WBC: 4.2 10*3/uL (ref 4.0–10.5)
nRBC: 0 % (ref 0.0–0.2)

## 2018-01-06 LAB — PROTIME-INR
INR: 1.15
Prothrombin Time: 14.6 seconds (ref 11.4–15.2)

## 2018-01-06 LAB — TSH: TSH: 0.271 u[IU]/mL — AB (ref 0.350–4.500)

## 2018-01-06 LAB — HEMOGLOBIN A1C
HEMOGLOBIN A1C: 7.4 % — AB (ref 4.8–5.6)
MEAN PLASMA GLUCOSE: 165.68 mg/dL

## 2018-01-06 LAB — TROPONIN I: Troponin I: 1.5 ng/mL (ref <0.03)

## 2018-01-06 LAB — ECHOCARDIOGRAM COMPLETE
Height: 65 in
Weight: 3844.82 oz

## 2018-01-06 LAB — MAGNESIUM: MAGNESIUM: 2 mg/dL (ref 1.7–2.4)

## 2018-01-06 LAB — APTT: aPTT: 100 seconds — ABNORMAL HIGH (ref 24–36)

## 2018-01-06 LAB — HEPARIN LEVEL (UNFRACTIONATED): Heparin Unfractionated: 0.5 IU/mL (ref 0.30–0.70)

## 2018-01-06 LAB — T4, FREE: FREE T4: 0.95 ng/dL (ref 0.82–1.77)

## 2018-01-06 LAB — BRAIN NATRIURETIC PEPTIDE: B Natriuretic Peptide: 816 pg/mL — ABNORMAL HIGH (ref 0.0–100.0)

## 2018-01-06 MED ORDER — FAMOTIDINE 20 MG PO TABS
20.0000 mg | ORAL_TABLET | Freq: Two times a day (BID) | ORAL | Status: DC
  Administered 2018-01-06 – 2018-01-10 (×8): 20 mg via ORAL
  Filled 2018-01-06 (×9): qty 1

## 2018-01-06 MED ORDER — FUROSEMIDE 10 MG/ML IJ SOLN
60.0000 mg | Freq: Two times a day (BID) | INTRAMUSCULAR | Status: DC
  Administered 2018-01-06 – 2018-01-08 (×4): 60 mg via INTRAVENOUS
  Filled 2018-01-06 (×4): qty 6

## 2018-01-06 MED ORDER — ORAL CARE MOUTH RINSE
15.0000 mL | Freq: Two times a day (BID) | OROMUCOSAL | Status: DC
  Administered 2018-01-06 – 2018-01-10 (×6): 15 mL via OROMUCOSAL

## 2018-01-06 MED ORDER — ASPIRIN EC 81 MG PO TBEC
81.0000 mg | DELAYED_RELEASE_TABLET | Freq: Every day | ORAL | Status: DC
  Administered 2018-01-06 – 2018-01-10 (×5): 81 mg via ORAL
  Filled 2018-01-06 (×5): qty 1

## 2018-01-06 MED ORDER — METHYLPREDNISOLONE SODIUM SUCC 40 MG IJ SOLR
40.0000 mg | Freq: Two times a day (BID) | INTRAMUSCULAR | Status: DC
  Administered 2018-01-06 – 2018-01-08 (×4): 40 mg via INTRAVENOUS
  Filled 2018-01-06 (×4): qty 1

## 2018-01-06 NOTE — Plan of Care (Signed)
Patient denies any shortness of breath. Patient is ambulating in the room without any distress.

## 2018-01-06 NOTE — Progress Notes (Signed)
ANTICOAGULATION CONSULT NOTE - Initial Consult  Pharmacy Consult for Heparin Indication: chest pain/ACS  Allergies  Allergen Reactions  . Metformin Other (See Comments)    Other reaction(s): Other (See Comments) Constipation, dry mouth, dizziness  . Bismuth Subsalicylate Rash    Pepto Bismol    Patient Measurements: Height: 5\' 5"  (165.1 cm) Weight: 240 lb 4.8 oz (109 kg) IBW/kg (Calculated) : 57 Heparin Dosing Weight: 77.77  Vital Signs: Temp: 98.3 F (36.8 C) (11/15 0200) Temp Source: Oral (11/15 0200) BP: 139/87 (11/15 0630) Pulse Rate: 80 (11/15 0630)  Labs: Recent Labs    01/05/18 1324 01/05/18 1520 01/05/18 1720 01/05/18 2220 01/06/18 0434  HGB 17.5*  --   --   --  16.6*  HCT 59.0*  --   --   --  56.3*  PLT 54*  --   --   --  94*  APTT  --  27  --   --  100*  LABPROT  --  14.4  --   --  14.6  INR  --  1.13  --   --  1.15  HEPARINUNFRC  --   --   --  0.30 0.50  CREATININE 1.04*  --   --   --  1.06*  TROPONINI 1.78*  --  1.51* 1.57* 1.50*    Estimated Creatinine Clearance: 68.5 mL/min (A) (by C-G formula based on SCr of 1.06 mg/dL (H)).   Medical History: Past Medical History:  Diagnosis Date  . CHF (congestive heart failure) (HCC)   . COPD (chronic obstructive pulmonary disease) (HCC)   . Depression   . Diabetes mellitus without complication (HCC)   . Hypercholesteremia   . Hypertension   . Seizures (HCC)   . Stroke [facility])     Medications:  Scheduled:  Infusions:  PRN:   Assessment: The patient presented to ED with worsening shortness of breath this morning.  She denies any fever or chills, no chest pain or palpitation or leg edema.  She is found in respiratory distress, O2 saturation was 60s in room air in the ED.  She is put on nonrebreather and put on BiPAP due to hypercapnia with PCO2 84.  Chest x-ray did not show any infiltrate or pulmonary edema.  Troponin level is elevated at 1.78. Concern for NSTEMI. Last note of patient being on a DOAC  (rivaroxaban) was in 2018. Platelets are low (54) - called Dr. Imogene Burn and he is ok with continuing with the heparin due to trops being elevated (risk vs benefit).   Goal of Therapy:  Heparin level 0.3-0.7 units/ml Monitor platelets by anticoagulation protocol: Yes   Plan:  Give 4000 units bolus x 1 Start heparin infusion at 1000 units/hr Check anti-Xa level in 6 hours and daily while on heparin Continue to monitor H&H and platelets  11/14 PM heparin level 0.30. Continue current regimen. Recheck heparin level and CBC with tomorrow AM labs to confirm.  11/15  AM heparin level 0.50. Continue current regimen. Recheck heparin level and CBC with tomorrow AM labs to confirm.  Brylin Stopper S, PharmD  [employer] 01/06/2018,7:08 AM

## 2018-01-06 NOTE — Progress Notes (Signed)
ANTICOAGULATION CONSULT NOTE   Pharmacy Consult for Heparin Indication: chest pain/ACS  Allergies  Allergen Reactions  . Metformin Other (See Comments)    Other reaction(s): Other (See Comments) Constipation, dry mouth, dizziness  . Bismuth Subsalicylate Rash    Pepto Bismol    Patient Measurements: Height: 5\' 5"  (165.1 cm) Weight: 240 lb 4.8 oz (109 kg) IBW/kg (Calculated) : 57 Heparin Dosing Weight: 77.77  Vital Signs: Temp: 98.6 F (37 C) (11/15 0734) Temp Source: Oral (11/15 0734) BP: 137/92 (11/15 0847) Pulse Rate: 85 (11/15 0900)  Labs: Recent Labs    01/05/18 1324 01/05/18 1520 01/05/18 1720 01/05/18 2220 01/06/18 0434  HGB 17.5*  --   --   --  16.6*  HCT 59.0*  --   --   --  56.3*  PLT 54*  --   --   --  94*  APTT  --  27  --   --  100*  LABPROT  --  14.4  --   --  14.6  INR  --  1.13  --   --  1.15  HEPARINUNFRC  --   --   --  0.30 0.50  CREATININE 1.04*  --   --   --  1.06*  TROPONINI 1.78*  --  1.51* 1.57* 1.50*    Estimated Creatinine Clearance: 68.5 mL/min (A) (by C-G formula based on SCr of 1.06 mg/dL (H)).   Medical History: Past Medical History:  Diagnosis Date  . CHF (congestive heart failure) (HCC)   . COPD (chronic obstructive pulmonary disease) (HCC)   . Depression   . Diabetes mellitus without complication (HCC)   . Hypercholesteremia   . Hypertension   . Seizures (HCC)   . Stroke [facility])     Assessment: The patient presented to ED with worsening shortness of breath this morning.  She denies any fever or chills, no chest pain or palpitation or leg edema.  She is found in respiratory distress, O2 saturation was 60s in room air in the ED.  She is put on nonrebreather and put on BiPAP due to hypercapnia with PCO2 84.  Chest x-ray did not show any infiltrate or pulmonary edema.  Troponin level is elevated at 1.78. Concern for NSTEMI. Last note of patient being on a DOAC (rivaroxaban) was in 2018. Platelets are low (54) - called Dr. Imogene Burn  and he is ok with continuing with the heparin due to trops being elevated (risk vs benefit).   On 11/15, troponin level is 1.5, platelets is 94.   Goal of Therapy:  Heparin level 0.3-0.7 units/ml Monitor platelets by anticoagulation protocol: Yes   Plan:   11/15  AM heparin level 0.50. Continue current regimen of heparin 1000 Units/hr. Recheck heparin level and CBC with tomorrow AM labs to confirm.  Emmaline Life,  01/06/2018,10:21 AM

## 2018-01-06 NOTE — Progress Notes (Signed)
[facility] [place] [facility]   PATIENT NAME: Brenda Wu    MR#:  [id number]  DATE OF BIRTH:  10/21/1955  SUBJECTIVE:  CHIEF COMPLAINT:   Chief Complaint  Patient presents with  . Respiratory Distress   Off BiPAP today.  Afebrile.  No chest pain.  REVIEW OF SYSTEMS:    Review of Systems  Constitutional: Positive for malaise/fatigue. Negative for chills and fever.  HENT: Negative for sore throat.   Eyes: Negative for blurred vision, double vision and pain.  Respiratory: Positive for cough, shortness of breath and wheezing. Negative for hemoptysis.   Cardiovascular: Positive for leg swelling. Negative for chest pain, palpitations and orthopnea.  Gastrointestinal: Negative for abdominal pain, constipation, diarrhea, heartburn, nausea and vomiting.  Genitourinary: Negative for dysuria and hematuria.  Musculoskeletal: Negative for back pain and joint pain.  Skin: Negative for rash.  Neurological: Negative for sensory change, speech change, focal weakness and headaches.  Endo/Heme/Allergies: Does not bruise/bleed easily.  Psychiatric/Behavioral: Negative for depression. The patient is not nervous/anxious.     DRUG ALLERGIES:   Allergies  Allergen Reactions  . Metformin Other (See Comments)    Other reaction(s): Other (See Comments) Constipation, dry mouth, dizziness  . Bismuth Subsalicylate Rash    Pepto Bismol    VITALS:  Blood pressure 128/72, pulse 85, temperature 98.1 F (36.7 C), temperature source Oral, resp. rate 18, height 5\' 5"  (1.651 m), weight 110.2 kg, SpO2 93 %.  PHYSICAL EXAMINATION:   Physical Exam  GENERAL:  62 y.o.-year-old patient lying in the bed with audible wheezing EYES: Pupils equal, round, reactive to light and accommodation. No scleral icterus. Extraocular muscles intact.  HEENT: Head atraumatic, normocephalic. Oropharynx and nasopharynx clear.  NECK:  Supple, no jugular venous distention. No thyroid enlargement, no  tenderness.  LUNGS: Decreased air entry bilaterally.  Bilateral wheezing CARDIOVASCULAR: S1, S2 normal. No murmurs, rubs, or gallops.  ABDOMEN: Soft, nontender, nondistended. Bowel sounds present. No organomegaly or mass.  EXTREMITIES: No cyanosis, clubbing.  Bilateral lower extremity edema NEUROLOGIC: Cranial nerves II through XII are intact. No focal Motor or sensory deficits b/l.   PSYCHIATRIC: The patient is alert and awake SKIN: No obvious rash, lesion, or ulcer.   LABORATORY PANEL:   CBC Recent Labs  Lab 01/06/18 0434  WBC 4.2  HGB 16.6*  HCT 56.3*  PLT 94*   ------------------------------------------------------------------------------------------------------------------ Chemistries  Recent Labs  Lab 01/06/18 0434  NA 145  K 3.9  CL 102  CO2 30  GLUCOSE 118*  BUN 13  CREATININE 1.06*  CALCIUM 8.8*  MG 2.0  AST 20  ALT 18  ALKPHOS 117  BILITOT 1.6*   ------------------------------------------------------------------------------------------------------------------  Cardiac Enzymes Recent Labs  Lab 01/06/18 0434  TROPONINI 1.50*   ------------------------------------------------------------------------------------------------------------------  RADIOLOGY:  Ct Angio Chest Pe W Or Wo Contrast  Result Date: 01/05/2018 CLINICAL DATA:  62 y.o. female with a history of CHF COPD diabetes hypertension who comes to the ED due to respiratory distress. She is brought by EMS. Difficulty breathing this morning. O2 saturation in the 60s on room air. EXAM: CT ANGIOGRAPHY CHEST WITH CONTRAST TECHNIQUE: Multidetector CT imaging of the chest was performed using the standard protocol during bolus administration of intravenous contrast. Multiplanar CT image reconstructions and MIPs were obtained to evaluate the vascular anatomy. CONTRAST:  [id number] NorrisIOHEXOL 350 MG/ML SOLN COMPARISON:  Chest x-ray on 01/05/2018 FINDINGS: Cardiovascular: The heart is enlarged. Pulmonary  arteries are moderately well opacified by contrast bolus. No acute pulmonary embolus.  There is minimal atherosclerotic calcification of the coronary arteries. Thoracic aorta is not aneurysmal. There is minimal atherosclerotic calcification of the great vessels. Mediastinum/Nodes: The visualized portion of the thyroid gland has a normal appearance. No mediastinal, hilar, or axillary adenopathy. Lungs/Pleura: Emphysematous changes are identified in the lung apices. There is minimal bibasilar atelectasis, RIGHT greater than LEFT. Small RIGHT pleural effusion. No suspicious pulmonary nodules. No pulmonary edema or consolidation. Upper Abdomen: There is a small amount of ascites. Musculoskeletal: There is deformity of the sternum consistent with remote fracture. Degenerative changes are seen in the thoracic spine. There is a chronic wedge compression fracture of T4. No suspicious lytic or blastic lesions are identified. Review of the MIP images confirms the above findings. IMPRESSION: 1. Emphysema Emphysema (ICD10-J43.9). 2. Small RIGHT pleural effusion and bibasilar atelectasis. 3. Cardiomegaly. 4. Technically adequate exam showing no acute pulmonary embolus. 5. Remote sternal fracture. Degenerative changes are seen in thoracic spine. Electronically Signed   By: Norva Pavlov M.D.   On: 01/05/2018 16:24   Dg Chest Port 1 View  Result Date: 01/06/2018 CLINICAL DATA:  Shortness of breath. EXAM: PORTABLE CHEST 1 VIEW COMPARISON:  01/05/2018. FINDINGS: The heart remains enlarged. Mild vascular congestion. Linear atelectasis in the LEFT mid lung zone. IMPRESSION: No change aeration. Mild vascular congestion. Early pulmonary edema not excluded. Electronically Signed   By: Elsie Stain M.D.   On: 01/06/2018 06:56   Dg Chest Portable 1 View  Result Date: 01/05/2018 CLINICAL DATA:  Difficulty breathing, respiratory distress EXAM: PORTABLE CHEST 1 VIEW COMPARISON:  Portable chest x-ray of 11/23/2016 FINDINGS: No  pneumonia or pleural effusion is seen. There is mild linear atelectasis at the left mid lung base with mild volume loss at the right lung base as well. Mediastinal and hilar contours are stable and moderate cardiomegaly is stable. No acute bony abnormality is seen. IMPRESSION: Mild basilar atelectasis. No pneumonia or pleural effusion. Stable cardiomegaly. Electronically Signed   By: Dwyane Dee M.D.   On: 01/05/2018 12:31     ASSESSMENT AND PLAN:   * Acute respiratory failure with hypoxia and hypercapnia due to COPD exacerbation. -IV steroids, Antibiotics - Scheduled Nebulizers - Inhalers -Wean O2 as tolerated - Consult pulmonary if no improvement  * NSTEMI ASA, Statin Heparin drip Cath on Monday  * Hypertension.  Continue hypertension medication.  * Diabetes.  Start sliding scale.  Hold glipizide, continue Lantus 15 unit subcu daily.  * Acute on Chronic diastolic CHF ejection fraction 40 to 45% Start IV lasix 60 mg BID Monitor I/os Repeat BMP in AM  * Morbid obesity and OSA.  CPAP at night.    * Chronic thrombocytopenia.  Follow-up CBC while on heparin drip.  All the records are reviewed and case discussed with Care Management/Social Worker Management plans discussed with the patient, family and they are in agreement.  CODE STATUS: Partial code(DNI)  DVT Prophylaxis: SCDs  TOTAL TIME TAKING CARE OF THIS PATIENT: 35 minutes.   POSSIBLE D/C IN 2-3 DAYS, DEPENDING ON CLINICAL CONDITION.  Orie Fisherman M.D on 01/06/2018 at 12:38 PM  Between 7am to 6pm - Pager - [phone]  After 6pm go to [email or web address] - password [email or web address] [facility]  [facility]  Office  [phone]  CC: Primary care physician; Cheron Schaumann., MD  Note: This dictation was prepared with Dragon dictation along with smaller phrase technology. Any transcriptional errors that result from this process are unintentional.

## 2018-01-06 NOTE — Progress Notes (Signed)
bipap 20/10 auto

## 2018-01-06 NOTE — Evaluation (Signed)
Occupational Therapy Evaluation Patient Details Name: Brenda Wu MRN: [id number] DOB: [birth year] Today's Date: 01/06/2018    History of Present Illness Pt is a 62 y.o. female presenting to hospital 01/05/18 with respiratory distress (O2 sats 60's on room air); placed on Bipap initially and now on nasal cannula.  Pt admitted with acute respiratory failure with hypoxia and hypercapnia d/t COPD exacerbation; pt also noted with elevated troponin (now downtrending) with possible NSTEMI.  PMH includes CHF, COPD, DM, HTN, seizures, TKR, h/o stroke, transient diplopia, B hearing loss, a-fib, and imaging showing remote sternal fx.   Clinical Impression   Pt seen for OT evaluation this date. Prior to hospital admission, pt was I with all ADLs and IADLs including driving and grocery shopping. She lives alone in a one story house with no steps to enter.  Currently pt demonstrates impairments in standing tolerance and balance as well as gross weakness requiring MIN A for LB dressing and functional ADL transfers. Pt would benefit from skilled OT to address noted impairments and functional limitations (see below for any additional details) in order to maximize safety and independence while minimizing falls risk and caregiver burden.  Upon hospital discharge, recommend pt discharge home with HHOT.    Follow Up Recommendations  Home health OT    Equipment Recommendations  None recommended by OT    Recommendations for Other Services       Precautions / Restrictions Precautions Precautions: Fall Restrictions Weight Bearing Restrictions: No      Mobility Bed Mobility Overal bed mobility: Needs Assistance Bed Mobility: Sit to Supine       Sit to supine: Supervision   General bed mobility comments: HOB elevated 30 degrees when pt performs sup to sit.   Transfers Overall transfer level: Needs assistance Equipment used: 1 person hand held assist Transfers: Sit to/from [employer] Sit to Stand: Min assist Stand pivot transfers: Min assist       General transfer comment: Pt requesting to sit up/get OOB bed. OT checked with RN about performing transfers d/t Hep drip and confusion, RN ok'ed t/f to chair adjacent to bed.     Balance Overall balance assessment: Needs assistance Sitting-balance support: No upper extremity supported;Feet supported Sitting balance-Leahy Scale: Good Sitting balance - Comments: G static sitting balance demonstrated, however-requires MIN A with dynamic sitting tasks d/t F dynamic sitting balance with LB dressing assessment.    Standing balance support: Single extremity supported Standing balance-Leahy Scale: Fair Standing balance comment: Pt with no overt LOB demonstrated during static standing, slightly unstable/weak so HHA given for support and one hand on gait belt for safety.                            ADL either performed or assessed with clinical judgement   ADL Overall ADL's : Needs assistance/impaired Eating/Feeding: Independent   Grooming: Set up   Upper Body Bathing: Set up   Lower Body Bathing: Minimal assistance   Upper Body Dressing : Supervision/safety   Lower Body Dressing: Minimal assistance Lower Body Dressing Details (indicate cue type and reason): Pt dons/doffs socks seated EOB, requires MIN A d/t decreased dynamic sitting balance at this time.  Toilet Transfer: Minimal Dentist Details (indicate cue type and reason): MIN A based on clinical observation of transfer from bed to chair Toileting- Clothing Manipulation and Hygiene: Minimal assistance   Tub/ Shower Transfer: Minimal assistance   Functional mobility during ADLs: (not  attempted d/t safety concerns including Hep drip and confusion)       Vision Baseline Vision/History: No visual deficits Patient Visual Report: No change from baseline       Perception     Praxis      Pertinent Vitals/Pain Pain  Assessment: No/denies pain     Hand Dominance     Extremity/Trunk Assessment Upper Extremity Assessment Upper Extremity Assessment: Generalized weakness   Lower Extremity Assessment Lower Extremity Assessment: Generalized weakness   Cervical / Trunk Assessment Cervical / Trunk Assessment: Normal   Communication Communication Communication: No difficulties   Cognition Arousal/Alertness: Awake/alert Behavior During Therapy: Impulsive Overall Cognitive Status: Impaired/Different from baseline Area of Impairment: Safety/judgement;Problem solving;Awareness;Following commands;Attention                   Current Attention Level: Selective   Following Commands: Follows multi-step commands inconsistently Safety/Judgement: Decreased awareness of safety;Decreased awareness of deficits Awareness: Anticipatory Problem Solving: Slow processing;Requires verbal cues;Requires tactile cues General Comments: Pt able to name the month with 2 verbal cues (season and upcoming holiday) able to name the town and knows she is in the hospital, and able to describe situation   General Comments       Exercises Other Exercises Other Exercises: Pt educated on fall risk and use of call light for safety/fall prevention. Pt requires reinforcement. Pt's friend "Raiford Noble" present in room and verbalized understanding.  Other Exercises: Pt educated on benefits of OOB activity and verbalized understanding. Will still require reinforcement d/t slight confusion on assessment.    Shoulder Instructions      Home Living Family/patient expects to be discharged to:: Private residence Living Arrangements: Spouse/significant other Available Help at Discharge: Family Type of Home: House Home Access: Level entry     Home Layout: One level     Bathroom Shower/Tub: Chief Strategy Officer: Standard Bathroom Accessibility: Yes How Accessible: Accessible via walker Home Equipment: Walker - 2  wheels          Prior Functioning/Environment Level of Independence: Independent with assistive device(s)        Comments: Pt uses 2 L O2 via nasal cannula at home chronic, requires increased time for dressing tasks, but states she was performing all ADLs/IADLs I'ly and driving/grocery shopping as well.         OT Problem List: Decreased strength;Decreased activity tolerance;Impaired balance (sitting and/or standing);Decreased safety awareness;Decreased knowledge of use of DME or AE;Decreased knowledge of precautions      OT Treatment/Interventions: Self-care/ADL training;Therapeutic exercise;Therapeutic activities;Balance training;DME and/or AE instruction;Patient/family education    OT Goals(Current goals can be found in the care plan section) Acute Rehab OT Goals Patient Stated Goal: to go home OT Goal Formulation: With patient Time For Goal Achievement: 01/20/18 Potential to Achieve Goals: Good  OT Frequency: Min 1X/week   Barriers to D/C:            Co-evaluation              AM-PAC PT "6 Clicks" Daily Activity     Outcome Measure Help from another person eating meals?: None Help from another person taking care of personal grooming?: A Little Help from another person toileting, which includes using toliet, bedpan, or urinal?: A Little Help from another person bathing (including washing, rinsing, drying)?: A Little Help from another person to put on and taking off regular upper body clothing?: A Little Help from another person to put on and taking off regular lower body clothing?: A  Little 6 Click Score: 19   End of Session Equipment Utilized During Treatment: Gait belt Nurse Communication: Mobility status(communicated to CNA about need for bed linen change and check of external urinary catheter after t/f to chair. )  Activity Tolerance: Patient tolerated treatment well Patient left: in chair;with call bell/phone within reach;with chair alarm set;with  family/visitor present  OT Visit Diagnosis: Unsteadiness on feet (R26.81);Muscle weakness (generalized) (M62.81)                Time: 0454-0981 OT Time Calculation (min): 31 min Charges:  OT General Charges $OT Visit: 1 Visit OT Evaluation $OT Eval Moderate Complexity: 1 Mod OT Treatments $Self Care/Home Management : 8-22 mins  Rejeana Brock, MS, OTR/L ascom [phone] or [phone] 01/06/18, 5:22 PM

## 2018-01-06 NOTE — Progress Notes (Signed)
Brenda Wu, M.D Pulmonary Critical Care & Sleep Medicine   ICU Patient Progress Note  Name: Brenda Wu  DOB: 10-21-1955  MRN: [id number]  Date: 01/06/2018   _0 I have reviewed the flowsheet and previous day's notes.  IMPRESSION:  Patient Active Problem List   Diagnosis Date Noted  . Acute on chronic respiratory failure with hypoxia ([place]) 01/05/2018  . Chronic systolic heart failure ([place]) 12/12/2017  . HTN (hypertension) 12/12/2017  . Diabetes ([place]) 12/12/2017  . COPD exacerbation ([place]) 02/07/2017  . History of cerebellar stroke 03/12/2016  . History of seizures 03/12/2016  . Pulmonary nodule, left 01/05/2016  . Paroxysmal atrial fibrillation ([place]) 10/17/2015  . Cerebellar stroke ([place]) 06/26/2015  . Transient diplopia 06/09/2015  . Asthma-chronic obstructive pulmonary disease overlap syndrome ([place]) 05/30/2015  . COPD with asthma ([place]) 05/30/2015  . Hypoxemia 05/30/2015  . Obstructive sleep apnea syndrome 05/30/2015  . OSA on CPAP 05/30/2015  . Obesity (BMI 30-39.9) 03/12/2015  . Bilateral hearing loss 01/22/2015  . Generalized seizure disorder ([place]) 01/22/2015  . Hyperlipidemia associated with type 2 diabetes mellitus ([place]) 01/22/2015  . Hypertension associated with diabetes ([place]) 01/22/2015  . Morbid obesity due to excess calories ([place]) 01/22/2015  . Primary osteoarthritis involving multiple joints 01/22/2015  . Severe episode of recurrent major depressive disorder, without psychotic features ([place]) 01/22/2015   Hypercarbic hypoxic respiratory failure: Possible related to COPD exacerbation- Solu-Medrol change 40 every 12, DuoNeb every 6, aspiration precaution, budesonide every 12/[place]  Non-STEMI with troponin I 1.78: Cardiology consult is called from ER Dr. Rockey Wu is evaluated on heparin , patient is allergic to aspirin will start Plavix, continue with heparin monitor platelets as patient is already thrombocytopenic  Diabetes on home medication was treated with  Lantus on the last admission will put on sliding scale coverage especially as she is going to be on steroids  Right lower lobe airspace disease with mild pleural effusion: Currently off antibiotics-monitor if show signs of Sirs will consider starting Unasyn   cardiomyopathy: Echo in September showed EF of 45 to 50%-received Lasix-we will check BNP and monitor  History of seizures continue Topamax and lamotrigine  History of hyperlipidemia continue with atorvastatin  History of hypertension was on Cardizem adjust blood blood pressure and troponin we might need to switch it to metoprolol or other beta-blocker  CKD: Creatinine except acceptable with 1.06 monitor with CT angios  Thrombocytopenia was noted on last admission unsure of etiology monitor if continues to show deterioration consider hematology consultation-improvement noted to 94  PLAN:  CVS: Serial cardiac enzymes echo, cardiology consult management as outlined above RS: Off BiPAP doing well, continue oxygen 2 L during the daytime BiPAP at night -Patient is DNI -UDS is okay aspiration precaution  ID: Questionable right lower lobe pneumonia with some pleural effusion -Monitor off antibiotics -- Follow culture and adjust ENDO: Sliding scale coverage, hold home medications for now -- SSI monitor blood sugar GI: PPI start diet RENAL: Monitor, potassium was 3.9 -- Electrolyte replacement protocol, Monitor Cr and K  CNS: No acute issues mentation is fine HEMATOLOGY: Thrombocytopenia monitor on heparin -- Monitor HB and PLT MUSCULOSKELETAL: No acute issue PAIN AND SEDATION: Avoid pain medication  Skin/Wound: Chronic changes  Electrolytes: Replace electrolytes per ICU electrolyte replacement protocol.   IVF: none  Nutrition: Diet as tolerated  Prophylaxis: DVT Prophylaxis with on heparin drip,. GI Prophylaxis.   Restraints: None  PT/OT eval and treat. OOB when appropriate.   Lines/Tubes: No Foley no central  line. ADVANCE DIRECTIVE:  Partial code okay with CPR no intubation FAMILY DISCUSSION: Spoke with the patient Quality Care: PPI, DVT prophylaxis, HOB elevated, Infection control all reviewed and addressed. Events and notes from last 24 hours reviewed. Care plan discussed on multidisciplinary rounds  Overall patient is doing well, off BiPAP, can be transferred out of ICU to telemetry with close cardiology follow-up with BiPAP at nighttime CC TIME: 35 minutes   Subjective: Brenda Wu  is a 62 y.o. female with a known history of chronic diastolic CHF EF: 31% - 51%, COPD, hypertension, diabetes, hyperlipidemia, seizure disorder and stroke.  The patient presented to ED with worsening shortness of breath this morning.  She denies any fever or chills, no chest pain or palpitation or leg edema.  She is found in respiratory distress, O2 saturation was 60s in room air in the ED.  She is put on nonrebreather and put on BiPAP due to hypercapnia with PCO2 84.  Chest x-ray did not show any infiltrate or pulmonary edema.  Troponin level is elevated at 1.78.  Review of chart suggested patient has severe COPD with FEV1 of 31% predicted - CTA chest done in the ER which did not reveal any evidence of PE showed some pleural effusion and emphysema  01/06/2018: Doing well no major issues overnight, ABG acceptable with pH improved to 7.32 with baseline PCO2 of 65 currently on 2 L oxygen denying any complaint  SIGNIFICANT STUDIES/EVENTS:  01/05/2018: Admission respiratory failure with BiPAP, CTA chest negative for PE troponin I 0.78 01/06/2018: Doing well off BiPAP ABG acceptable plan to transfer to out of ICU   CULTURES:  Blood: 01/05/2018 Sputum: 01/05/2018   ANTIBIOTICS:  None     LINES/TUBES:  None  _0 The patient is critically ill on     _1 Mechanical ventilation _2 Pressors  _3 BiPAP _4    MEDICATIONS:  Vital Signs:   BP (!) 137/92   Pulse 85   Temp 98.6 F (37 C) (Oral)   Resp 18   Ht 5'  5" (1.651 m)   Wt 109 kg   SpO2 95%   BMI 39.99 kg/m       Prehospital Care Cardiac Rhythm: Normal sinus rhythm  Temp (24hrs), Avg:98.2 F (36.8 C), Min:97.5 F (36.4 C), Max:98.8 F (37.1 C)   Intake/Output:Last shift:      11/15 0701 - 11/15 1900 In: 8.8 [I.V.:8.8] Out: - Last 3 shifts: 11/13 1901 - 11/15 0700 In: 395 [I.V.:194.9] Out: 845 [Urine:845]  Intake/Output Summary (Last 24 hours) at 01/06/2018 0935 Last data filed at 01/06/2018 0858 Gross per 24 hour  Intake 403.78 ml  Output 845 ml  Net -441.22 ml    Objective:   Physical Exam:  General: comfortable, no acute distress currently on BiPAP HEENT: pupils reactive, sclera anicteric, EOM intact Neck: No adenopathy or thyroid swelling, no lymphadenopathy or JVD, supple CVS: S1S2 no murmurs RS: Mod AE bilaterally, sedentary at the base with some rales Abd: soft, non tender, no hepatosplenomegaly Neuro: non focal, awake, alert Extrm: Bilateral leg edema with chronic changes Lymph node: No obvious palpable lymph node appreciated. Skin: no rash   Labs:  CBC w/Diff Recent Labs  Lab 01/05/18 1324 01/06/18 0434  HGB 17.5* 16.6*  HCT 59.0* 56.3*  WBC 7.2 4.2  PLT 54* 94*     Chemistry Recent Labs  Lab 01/05/18 1324 01/06/18 0434  NA 146* 145  K 3.3* 3.9  CL 102 102  CO2 33* 30  BUN 13 13  CREATININE 1.04* 1.06*  GLUCOSE 146*  118*    Hepatic Function Latest Ref Rng & Units 01/06/2018 01/05/2018 11/23/2017  Total Protein 6.5 - 8.1 g/dL 6.2(L) 6.8 6.6  Albumin 3.5 - 5.0 g/dL 3.4(L) 4.1 4.0  AST 15 - 41 U/L 20 26 33  ALT 0 - 44 U/L 18 20 40  Alk Phosphatase 38 - 126 U/L 117 134(H) 119  Total Bilirubin 0.3 - 1.2 mg/dL 1.6(H) 1.4(H) 1.0     Lactic Acid No results found for: LATICACIDVEN   Micro  _0 @ Recent Results (from the past 720 hour(s))  MRSA PCR Screening     Status: None   Collection Time: 01/05/18  5:04 PM  Result Value Ref Range Status   MRSA by PCR NEGATIVE NEGATIVE Final     Comment:        The GeneXpert MRSA Assay (FDA approved for NASAL specimens only), is one component of a comprehensive MRSA colonization surveillance program. It is not intended to diagnose MRSA infection nor to guide or monitor treatment for MRSA infections. Performed at [facility], [address] [place], [place] [address]      ABG    Component Value Date/Time   PHART 7.32 (L) 01/06/2018 0404   PCO2ART 65 (H) 01/06/2018 0404   PO2ART 50 (L) 01/06/2018 0404   HCO3 33.5 (H) 01/06/2018 0404   O2SAT 81.6 01/06/2018 0404     Cardiac Enzymes Recent Labs    01/05/18 1720 01/05/18 2220 01/06/18 0434  TROPONINI 1.51* 1.57* 1.50*      Coagulation Lab Results  Component Value Date   INR 1.15 01/06/2018   APTT 100 (H) 01/06/2018     Radiology Results Reviewed by me and compared with previous imaging studies Ct Angio Chest Pe W Or Wo Contrast  Result Date: 01/05/2018 CLINICAL DATA:  62 y.o. female with a history of CHF COPD diabetes hypertension who comes to the ED due to respiratory distress. She is brought by EMS. Difficulty breathing this morning. O2 saturation in the 60s on room air. EXAM: CT ANGIOGRAPHY CHEST WITH CONTRAST TECHNIQUE: Multidetector CT imaging of the chest was performed using the standard protocol during bolus administration of intravenous contrast. Multiplanar CT image reconstructions and MIPs were obtained to evaluate the vascular anatomy. CONTRAST:  54m OMNIPAQUE IOHEXOL 350 MG/ML SOLN COMPARISON:  Chest x-ray on 01/05/2018 FINDINGS: Cardiovascular: The heart is enlarged. Pulmonary arteries are moderately well opacified by contrast bolus. No acute pulmonary embolus. There is minimal atherosclerotic calcification of the coronary arteries. Thoracic aorta is not aneurysmal. There is minimal atherosclerotic calcification of the great vessels. Mediastinum/Nodes: The visualized portion of the thyroid gland has a normal appearance. No mediastinal, hilar, or  axillary adenopathy. Lungs/Pleura: Emphysematous changes are identified in the lung apices. There is minimal bibasilar atelectasis, RIGHT greater than LEFT. Small RIGHT pleural effusion. No suspicious pulmonary nodules. No pulmonary edema or consolidation. Upper Abdomen: There is a small amount of ascites. Musculoskeletal: There is deformity of the sternum consistent with remote fracture. Degenerative changes are seen in the thoracic spine. There is a chronic wedge compression fracture of T4. No suspicious lytic or blastic lesions are identified. Review of the MIP images confirms the above findings. IMPRESSION: 1. Emphysema Emphysema (ICD10-J43.9). 2. Small RIGHT pleural effusion and bibasilar atelectasis. 3. Cardiomegaly. 4. Technically adequate exam showing no acute pulmonary embolus. 5. Remote sternal fracture. Degenerative changes are seen in thoracic spine. Electronically Signed   By: ENolon NationsM.D.   On: 01/05/2018 16:24   [facility]  Result Date: 01/06/2018 CLINICAL DATA:  Shortness of breath. EXAM: PORTABLE CHEST 1 VIEW COMPARISON:  01/05/2018. FINDINGS: The heart remains enlarged. Mild vascular congestion. Linear atelectasis in the LEFT mid lung zone. IMPRESSION: No change aeration. Mild vascular congestion. Early pulmonary edema not excluded. Electronically Signed   By: Staci Righter M.D.   On: 01/06/2018 06:56   Dg Chest Portable 1 View  Result Date: 01/05/2018 CLINICAL DATA:  Difficulty breathing, respiratory distress EXAM: PORTABLE CHEST 1 VIEW COMPARISON:  Portable chest x-ray of 11/23/2016 FINDINGS: No pneumonia or pleural effusion is seen. There is mild linear atelectasis at the left mid lung base with mild volume loss at the right lung base as well. Mediastinal and hilar contours are stable and moderate cardiomegaly is stable. No acute bony abnormality is seen. IMPRESSION: Mild basilar atelectasis. No pneumonia or pleural effusion. Stable cardiomegaly. Electronically  Signed   By: Ivar Drape M.D.   On: 01/05/2018 12:31      ECHO    Brenda Rocker, MD 01/06/2018 _0 @   The patient is: _1  acutely ill Risk of deterioration: _2  moderate   _3  critically ill  _4  high   _5 See my orders for details  My assessment, plan of care, findings, medications, side effects etc were discussed with: _6 nursing _7 PT/OT   _8 respiratory therapy _9 Dr.  Karle Plumber family _11    Brenda Wu, M.D. Pulmonary Critical Care & Sleep Medicine

## 2018-01-06 NOTE — Progress Notes (Signed)
*  PRELIMINARY RESULTS* Echocardiogram 2D Echocardiogram has been performed.  Brenda Wu 01/06/2018, 9:24 AM

## 2018-01-06 NOTE — Evaluation (Signed)
Physical Therapy Evaluation Patient Details Name: Brenda Wu MRN: [id number] DOB: [birth year] Today's Date: 01/06/2018   History of Present Illness  Pt is a 62 y.o. female presenting to hospital 01/05/18 with respiratory distress (O2 sats 60's on room air); placed on Bipap initially and now on nasal cannula.  Pt admitted with acute respiratory failure with hypoxia and hypercapnia d/t COPD exacerbation; pt also noted with elevated troponin (now downtrending) with possible NSTEMI.  PMH includes CHF, COPD, DM, htn, seizures, TKR, h/o stroke, transient diplopia, B hearing loss, a-fib, and imaging showing remote sternal fx.  Clinical Impression  Prior to hospital admission, pt was ambulatory (no AD) and uses 2 L home O2 via nasal cannula chronic.  Pt lives with her husband in 1 level home with level entry.  Pt partially sitting on edge of bed upon PT arrival with nasal cannula removed.  Pt's O2 sats 83% so therapist cued pt to place O2 back on.  Pt appearing confused, on heparin drip, and not appropriate for OOB mobility d/t safety concerns.  Pt assisted back to bed with SBA (vc's required for safety, line management, and repositioning).  With repositioning and rest in bed, pt's O2 improved to 92% on 2 L O2 via nasal cannula.  Per notes, plan for cardiac cath on Monday.  Per discussion with Nicolasa Ducking (NP Cardiology), ok for low level activity with physical therapy at this time (up to chair per pt tolerance).   Pt would benefit from skilled PT to address noted impairments and functional limitations (see below for any additional details).  Will attempt OOB mobility next session as appropriate but anticipate PT recommendations for HHPT and 24/7 assist for safety.    Follow Up Recommendations Home health PT;Supervision/Assistance - 24 hour    Equipment Recommendations  Rolling walker with 5" wheels(pt has RW at home already)    Recommendations for Other Services       Precautions /  Restrictions Precautions Precautions: Fall Restrictions Weight Bearing Restrictions: No      Mobility  Bed Mobility Overal bed mobility: Needs Assistance Bed Mobility: Sit to Supine       Sit to supine: Supervision   General bed mobility comments: mild increased effort to perform on own (able to scoot up in bed on own with B bedrail use)  Transfers                 General transfer comment: Deferred d/t safety concerns  Ambulation/Gait             General Gait Details: Deferred d/t safety concerns  Stairs            Wheelchair Mobility    Modified Rankin (Stroke Patients Only)       Balance Overall balance assessment: Needs assistance Sitting-balance support: No upper extremity supported;Feet supported Sitting balance-Leahy Scale: Good Sitting balance - Comments: steady sitting reaching within BOS                                     Pertinent Vitals/Pain Pain Assessment: No/denies pain    Home Living Family/patient expects to be discharged to:: Private residence Living Arrangements: Spouse/significant other(Husband) Available Help at Discharge: Family Type of Home: House Home Access: Level entry     Home Layout: One level Home Equipment: Environmental consultant - 2 wheels      Prior Function Level of Independence: Independent with assistive device(s)  Comments: Pt uses 2 L O2 via nasal cannula at home chronic     Hand Dominance        Extremity/Trunk Assessment   Upper Extremity Assessment Upper Extremity Assessment: Generalized weakness    Lower Extremity Assessment Lower Extremity Assessment: Generalized weakness       Communication   Communication: No difficulties  Cognition Arousal/Alertness: Awake/alert Behavior During Therapy: Impulsive Overall Cognitive Status: Impaired/Different from baseline Area of Impairment: Safety/judgement;Problem solving;Awareness;Following commands;Attention                    Current Attention Level: Selective   Following Commands: Follows multi-step commands inconsistently Safety/Judgement: Decreased awareness of safety;Decreased awareness of deficits Awareness: Anticipatory Problem Solving: Slow processing;Requires verbal cues;Requires tactile cues        General Comments   Nursing cleared pt for participation in physical therapy.  Pt agreeable to PT session.    Exercises     Assessment/Plan    PT Assessment Patient needs continued PT services  PT Problem List Decreased strength;Decreased activity tolerance;Decreased balance;Decreased mobility;Decreased cognition;Decreased knowledge of use of DME;Cardiopulmonary status limiting activity;Decreased safety awareness       PT Treatment Interventions DME instruction;Gait training;Functional mobility training;Therapeutic activities;Therapeutic exercise;Balance training;Patient/family education    PT Goals (Current goals can be found in the Care Plan section)  Acute Rehab PT Goals Patient Stated Goal: to go home PT Goal Formulation: With patient Time For Goal Achievement: 01/20/18 Potential to Achieve Goals: Fair    Frequency Min 2X/week   Barriers to discharge        Co-evaluation               AM-PAC PT "6 Clicks" Daily Activity  Outcome Measure Difficulty turning over in bed (including adjusting bedclothes, sheets and blankets)?: A Little Difficulty moving from lying on back to sitting on the side of the bed? : A Lot Difficulty sitting down on and standing up from a chair with arms (e.g., wheelchair, bedside commode, etc,.)?: Unable Help needed moving to and from a bed to chair (including a wheelchair)?: A Little Help needed walking in hospital room?: A Little Help needed climbing 3-5 steps with a railing? : A Lot 6 Click Score: 14    End of Session Equipment Utilized During Treatment: Oxygen Activity Tolerance: Patient tolerated treatment well Patient left: in bed;with call  bell/phone within reach;with bed alarm set Nurse Communication: Mobility status;Precautions;Other (comment)(Pt's O2 sats and safety concerns) PT Visit Diagnosis: Other abnormalities of gait and mobility (R26.89);Muscle weakness (generalized) (M62.81);Difficulty in walking, not elsewhere classified (R26.2)    Time: 1610-9604 PT Time Calculation (min) (ACUTE ONLY): 15 min   Charges:   PT Evaluation $PT Eval Low Complexity: 1 Low        Hailey Stormer, PT 01/06/18, 2:45 PM [phone]

## 2018-01-06 NOTE — Progress Notes (Signed)
Progress Note  Patient Name: Brenda Wu Date of Encounter: 01/06/2018  Primary Cardiologist: Julien Nordmann, MD  Subjective   No chest pain this AM but says that yesterday @ home/prior to admission, she had sudden onset of dyspnea and chest pain with disorientation.  She thinks she was able to call her neighbor, who then called EMS.  Though she has a h/o COPD and wears supplemental O2 @ home, she says yesterday was very different from any prior COPD flare.  Inpatient Medications    Scheduled Meds: . aspirin EC  81 mg Oral Daily  . atorvastatin  40 mg Oral q1800  . budesonide (PULMICORT) nebulizer solution  0.25 mg Nebulization BID  . chlorhexidine  15 mL Mouth Rinse BID  . clopidogrel  75 mg Oral Daily  . diltiazem  60 mg Oral Daily  . ferrous sulfate  325 mg Oral Q breakfast  . furosemide  80 mg Oral Daily  . insulin aspart  0-15 Units Subcutaneous Q4H  . insulin glargine  15 Units Subcutaneous Daily  . ipratropium-albuterol  3 mL Nebulization QID  . lamoTRIgine  200 mg Oral BID  . losartan  50 mg Oral Daily  . mouth rinse  15 mL Mouth Rinse q12n4p  . methylPREDNISolone (SOLU-MEDROL) injection  60 mg Intravenous Q6H  . niacin  500 mg Oral Daily  . potassium chloride SA  20 mEq Oral Daily  . pregabalin  75 mg Oral BID  . sodium chloride flush  3 mL Intravenous Q12H  . tiotropium  18 mcg Inhalation Daily  . topiramate  200 mg Oral BID  . vitamin B-12  500 mcg Oral Daily   Continuous Infusions: . sodium chloride    . heparin 1,000 Units/hr (01/06/18 0735)   PRN Meds: sodium chloride, acetaminophen **OR** acetaminophen, albuterol, bisacodyl, guaiFENesin-dextromethorphan, HYDROcodone-acetaminophen, ondansetron **OR** ondansetron (ZOFRAN) IV, senna-docusate, sodium chloride flush   Vital Signs    Vitals:   01/06/18 0700 01/06/18 0734 01/06/18 0800 01/06/18 0847  BP: (!) 134/91 (!) 141/88 135/89 (!) 137/92  Pulse: 79 81 88   Resp: 14 15 20    Temp:  98.6 F (37  C)    TempSrc:  Oral    SpO2: 93% 96% 95%   Weight:      Height:        Intake/Output Summary (Last 24 hours) at 01/06/2018 0859 Last data filed at 01/06/2018 0735 Gross per 24 hour  Intake 400.78 ml  Output 845 ml  Net -444.22 ml   Filed Weights   01/05/18 1209 01/05/18 1645  Weight: 113.4 kg 109 kg    Physical Exam   GEN: Well nourished, well developed, in no acute distress.  HEENT: Grossly normal.  Neck: Supple, obese, difficult to gauge jvp.  No carotid bruits, or masses. Cardiac: RRR, no murmurs, rubs, or gallops. No clubbing, cyanosis, trace bilat ankle edema.  Radials/DP/PT 2+ and equal bilaterally.  Respiratory:  Respirations regular and unlabored, markedly diminished breath sounds bilaterally. Faint insp/exp wheezing. GI: Obese, soft, nontender, nondistended, BS + x 4. MS: no deformity or atrophy. Skin: warm and dry, no rash. Neuro:  Strength and sensation are intact. Psych: AAOx3.  Normal affect.  Labs    Chemistry Recent Labs  Lab 01/05/18 1324 01/06/18 0434  NA 146* 145  K 3.3* 3.9  CL 102 102  CO2 33* 30  GLUCOSE 146* 118*  BUN 13 13  CREATININE 1.04* 1.06*  CALCIUM 9.0 8.8*  PROT 6.8 6.2*  ALBUMIN 4.1 3.4*  AST 26 20  ALT 20 18  ALKPHOS 134* 117  BILITOT 1.4* 1.6*  GFRNONAA 57* 55*  GFRAA >60 >60  ANIONGAP 11 13     Hematology Recent Labs  Lab 01/05/18 1324 01/06/18 0434  WBC 7.2 4.2  RBC 5.75* 5.50*  HGB 17.5* 16.6*  HCT 59.0* 56.3*  MCV 102.6* 102.4*  MCH 30.4 30.2  MCHC 29.7* 29.5*  RDW 19.9* 19.4*  PLT 54* 94*    Cardiac Enzymes Recent Labs  Lab 01/05/18 1324 01/05/18 1720 01/05/18 2220 01/06/18 0434  TROPONINI 1.78* 1.51* 1.57* 1.50*      BNP Recent Labs  Lab 01/06/18 0434  BNP 816.0*     Radiology    Ct Angio Chest Pe W Or Wo Contrast  Result Date: 01/05/2018 CLINICAL DATA:  62 y.o. female with a history of CHF COPD diabetes hypertension who comes to the ED due to respiratory distress. She is brought  by EMS. Difficulty breathing this morning. O2 saturation in the 60s on room air. EXAM: CT ANGIOGRAPHY CHEST WITH CONTRAST TECHNIQUE: Multidetector CT imaging of the chest was performed using the standard protocol during bolus administration of intravenous contrast. Multiplanar CT image reconstructions and MIPs were obtained to evaluate the vascular anatomy. CONTRAST:  66mL OMNIPAQUE IOHEXOL 350 MG/ML SOLN COMPARISON:  Chest x-ray on 01/05/2018 FINDINGS: Cardiovascular: The heart is enlarged. Pulmonary arteries are moderately well opacified by contrast bolus. No acute pulmonary embolus. There is minimal atherosclerotic calcification of the coronary arteries. Thoracic aorta is not aneurysmal. There is minimal atherosclerotic calcification of the great vessels. Mediastinum/Nodes: The visualized portion of the thyroid gland has a normal appearance. No mediastinal, hilar, or axillary adenopathy. Lungs/Pleura: Emphysematous changes are identified in the lung apices. There is minimal bibasilar atelectasis, RIGHT greater than LEFT. Small RIGHT pleural effusion. No suspicious pulmonary nodules. No pulmonary edema or consolidation. Upper Abdomen: There is a small amount of ascites. Musculoskeletal: There is deformity of the sternum consistent with remote fracture. Degenerative changes are seen in the thoracic spine. There is a chronic wedge compression fracture of T4. No suspicious lytic or blastic lesions are identified. Review of the MIP images confirms the above findings. IMPRESSION: 1. Emphysema Emphysema (ICD10-J43.9). 2. Small RIGHT pleural effusion and bibasilar atelectasis. 3. Cardiomegaly. 4. Technically adequate exam showing no acute pulmonary embolus. 5. Remote sternal fracture. Degenerative changes are seen in thoracic spine. Electronically Signed   By: Norva Pavlov M.D.   On: 01/05/2018 16:24   Dg Chest Port 1 View  Result Date: 01/06/2018 CLINICAL DATA:  Shortness of breath. EXAM: PORTABLE CHEST 1 VIEW  COMPARISON:  01/05/2018. FINDINGS: The heart remains enlarged. Mild vascular congestion. Linear atelectasis in the LEFT mid lung zone. IMPRESSION: No change aeration. Mild vascular congestion. Early pulmonary edema not excluded. Electronically Signed   By: Elsie Stain M.D.   On: 01/06/2018 06:56   Dg Chest Portable 1 View  Result Date: 01/05/2018 CLINICAL DATA:  Difficulty breathing, respiratory distress EXAM: PORTABLE CHEST 1 VIEW COMPARISON:  Portable chest x-ray of 11/23/2016 FINDINGS: No pneumonia or pleural effusion is seen. There is mild linear atelectasis at the left mid lung base with mild volume loss at the right lung base as well. Mediastinal and hilar contours are stable and moderate cardiomegaly is stable. No acute bony abnormality is seen. IMPRESSION: Mild basilar atelectasis. No pneumonia or pleural effusion. Stable cardiomegaly. Electronically Signed   By: Dwyane Dee M.D.   On: 01/05/2018 12:31    Telemetry    rsr -  Personally Reviewed  Cardiac Studies   2D Echocardiogram 11/05/2017  Study Conclusions   - Left ventricle: The cavity size was normal. Systolic function was   mildly reduced. The estimated ejection fraction was in the range   of 45% to 50%. Diffuse hypokinesis. Regional wall motion   abnormalities cannot be excluded. Left ventricular diastolic   function parameters were normal. - Mitral valve: There was mild to moderate regurgitation. - Left atrium: The atrium was mildly dilated. - Right ventricle: The cavity size was moderately dilated. Systolic   function was mildly reduced. - Right atrium: The atrium was mildly dilated. - Tricuspid valve: There was mild-moderate regurgitation. - Pulmonary arteries: Systolic pressure was within the normal   range.   Patient Profile     62 y.o. female w/ a h/o severe COPD, remote tob abuse, HFpEF, HTN, DM, and obesity, who was admitted 9/14 with chest pain/dyspnea and acute on chronic hypoxic resp failure w/ elevated  trop and mild LV dysfxn on echo.  Assessment & Plan    1.  Acute on chronic hypoxic resp failure/AECOPD:  Pt says that though she has chronic dyspnea req O2, on 9/14, she had acute onset of c/p, dyspnea, and disorientation.  Neighbor called EMS.  O2 sat 60% on arrival.  CO2 84 by ABG on admission.  Now on nasal cannula and stable @ rest.  Markedly diminished breath sounds with faint wheezing.  Nebs/steroids per critical care.  2.  NSTEMI:  Pt says that she was having chest pain associated w/ dyspnea while @ home yesterday.  Trop highest on arrival @ 1.78 and has been relatively flat since (1.51  1.57  1.50).  Suspect this is demand ischemia but given symptoms, risk factors, and finding of mild LV dysfxn, she will need diagnostic cath once pulm issues improved - hopefully Monday.  CTA chest showed only minimal coronary Ca2+/atherosclerosis.  Body habitus makes her a poor candidate for noninvasive stress testing.  Cont asa (reduce to 81), plavix (this was added on admission), statin, and heparin.  No  blocker in setting of #1/wheezing.  3.  Cardiomyopathy/combined syst/diast CHF:  EF 45-50%, diff HK (prev nl EF in 01/2017).  Body habitus makes exam challenging, but does not appear to be grossly volume overloaded. Wt, if accurate, stable. On losartan and lasix @ home - resumed this AM.  BP mildly elevated.  Renal fxn stable. Ischemic eval as above.  4.  Essential HTN: mildly elevated - home meds resumed this AM.  5.  DMII:  Per ccm.  Signed, Nicolasa Ducking, NP  01/06/2018, 8:59 AM    For questions or updates, please contact   Please consult [email or web address] for contact info under Cardiology/STEMI.

## 2018-01-07 DIAGNOSIS — R0602 Shortness of breath: Secondary | ICD-10-CM

## 2018-01-07 LAB — GLUCOSE, CAPILLARY
GLUCOSE-CAPILLARY: 106 mg/dL — AB (ref 70–99)
GLUCOSE-CAPILLARY: 133 mg/dL — AB (ref 70–99)
GLUCOSE-CAPILLARY: 216 mg/dL — AB (ref 70–99)
Glucose-Capillary: 114 mg/dL — ABNORMAL HIGH (ref 70–99)
Glucose-Capillary: 180 mg/dL — ABNORMAL HIGH (ref 70–99)
Glucose-Capillary: 205 mg/dL — ABNORMAL HIGH (ref 70–99)

## 2018-01-07 LAB — CBC
HCT: 50.2 % — ABNORMAL HIGH (ref 36.0–46.0)
Hemoglobin: 15.5 g/dL — ABNORMAL HIGH (ref 12.0–15.0)
MCH: 30.5 pg (ref 26.0–34.0)
MCHC: 30.9 g/dL (ref 30.0–36.0)
MCV: 98.6 fL (ref 80.0–100.0)
NRBC: 0 % (ref 0.0–0.2)
PLATELETS: 100 10*3/uL — AB (ref 150–400)
RBC: 5.09 MIL/uL (ref 3.87–5.11)
RDW: 18.6 % — ABNORMAL HIGH (ref 11.5–15.5)
WBC: 8.4 10*3/uL (ref 4.0–10.5)

## 2018-01-07 LAB — HEPARIN LEVEL (UNFRACTIONATED): HEPARIN UNFRACTIONATED: 0.47 [IU]/mL (ref 0.30–0.70)

## 2018-01-07 LAB — BASIC METABOLIC PANEL
Anion gap: 10 (ref 5–15)
BUN: 20 mg/dL (ref 8–23)
CHLORIDE: 102 mmol/L (ref 98–111)
CO2: 32 mmol/L (ref 22–32)
CREATININE: 1.04 mg/dL — AB (ref 0.44–1.00)
Calcium: 8.6 mg/dL — ABNORMAL LOW (ref 8.9–10.3)
GFR calc Af Amer: >60 mL/min (ref >60)
GFR calc non Af Amer: 57 mL/min — ABNORMAL LOW (ref >60)
Glucose, Bld: 120 mg/dL — ABNORMAL HIGH (ref 70–99)
Potassium: 3.5 mmol/L (ref 3.5–5.1)
SODIUM: 144 mmol/L (ref 135–145)

## 2018-01-07 MED ORDER — IPRATROPIUM-ALBUTEROL 0.5-2.5 (3) MG/3ML IN SOLN
3.0000 mL | Freq: Three times a day (TID) | RESPIRATORY_TRACT | Status: DC
  Administered 2018-01-07 – 2018-01-10 (×8): 3 mL via RESPIRATORY_TRACT
  Filled 2018-01-07 (×8): qty 3

## 2018-01-07 MED ORDER — INSULIN ASPART 100 UNIT/ML ~~LOC~~ SOLN
0.0000 [IU] | Freq: Three times a day (TID) | SUBCUTANEOUS | Status: DC
  Administered 2018-01-07: 5 [IU] via SUBCUTANEOUS
  Administered 2018-01-07: 3 [IU] via SUBCUTANEOUS
  Administered 2018-01-08 (×2): 5 [IU] via SUBCUTANEOUS
  Administered 2018-01-08: 11 [IU] via SUBCUTANEOUS
  Administered 2018-01-09: 15 [IU] via SUBCUTANEOUS
  Administered 2018-01-10: 5 [IU] via SUBCUTANEOUS
  Filled 2018-01-07 (×7): qty 1

## 2018-01-07 NOTE — Progress Notes (Signed)
Placed patient on 4l [place] after neb tx.

## 2018-01-07 NOTE — Progress Notes (Signed)
[employer] - [place] [facility]   PATIENT NAME: Brenda Wu    MR#:  [id number]  DATE OF BIRTH:  [birth year]  SUBJECTIVE:  CHIEF COMPLAINT:   Chief Complaint  Patient presents with  . Respiratory Distress   Continues to be on nasal cannula oxygen.  Still has wheezing.  No chest pain. Heparin drip. Afebrile.  REVIEW OF SYSTEMS:    Review of Systems  Constitutional: Positive for malaise/fatigue. Negative for chills and fever.  HENT: Negative for sore throat.   Eyes: Negative for blurred vision, double vision and pain.  Respiratory: Positive for cough, shortness of breath and wheezing. Negative for hemoptysis.   Cardiovascular: Positive for leg swelling. Negative for chest pain, palpitations and orthopnea.  Gastrointestinal: Negative for abdominal pain, constipation, diarrhea, heartburn, nausea and vomiting.  Genitourinary: Negative for dysuria and hematuria.  Musculoskeletal: Negative for back pain and joint pain.  Skin: Negative for rash.  Neurological: Negative for sensory change, speech change, focal weakness and headaches.  Endo/Heme/Allergies: Does not bruise/bleed easily.  Psychiatric/Behavioral: Negative for depression. The patient is not nervous/anxious.     DRUG ALLERGIES:   Allergies  Allergen Reactions  . Metformin Other (See Comments)    Other reaction(s): Other (See Comments) Constipation, dry mouth, dizziness  . Bismuth Subsalicylate Rash    Pepto Bismol    VITALS:  Blood pressure 132/78, pulse 82, temperature 98.6 F (37 C), temperature source Oral, resp. rate 16, height 5\' 5"  (1.651 m), weight 107.7 kg, SpO2 97 %.  PHYSICAL EXAMINATION:   Physical Exam  GENERAL:  62 y.o.-year-old patient lying in the bed with audible wheezing EYES: Pupils equal, round, reactive to light and accommodation. No scleral icterus. Extraocular muscles intact.  HEENT: Head atraumatic, normocephalic. Oropharynx and nasopharynx clear.  NECK:  Supple,  no jugular venous distention. No thyroid enlargement, no tenderness.  LUNGS: Decreased air entry bilaterally.  Bilateral wheezing CARDIOVASCULAR: S1, S2 normal. No murmurs, rubs, or gallops.  ABDOMEN: Soft, nontender, nondistended. Bowel sounds present. No organomegaly or mass.  EXTREMITIES: No cyanosis, clubbing.  Bilateral lower extremity edema NEUROLOGIC: Cranial nerves II through XII are intact. No focal Motor or sensory deficits b/l.   PSYCHIATRIC: The patient is alert and awake SKIN: No obvious rash, lesion, or ulcer.   LABORATORY PANEL:   CBC Recent Labs  Lab 01/07/18 0500  WBC 8.4  HGB 15.5*  HCT 50.2*  PLT 100*   ------------------------------------------------------------------------------------------------------------------ Chemistries  Recent Labs  Lab 01/06/18 0434 01/07/18 0456  NA 145 144  K 3.9 3.5  CL 102 102  CO2 30 32  GLUCOSE 118* 120*  BUN 13 20  CREATININE 1.06* 1.04*  CALCIUM 8.8* 8.6*  MG 2.0  --   AST 20  --   ALT 18  --   ALKPHOS 117  --   BILITOT 1.6*  --    ------------------------------------------------------------------------------------------------------------------  Cardiac Enzymes Recent Labs  Lab 01/06/18 0434  TROPONINI 1.50*   ------------------------------------------------------------------------------------------------------------------  RADIOLOGY:  Ct Angio Chest Pe W Or Wo Contrast  Result Date: 01/05/2018 CLINICAL DATA:  62 y.o. female with a history of CHF COPD diabetes hypertension who comes to the ED due to respiratory distress. She is brought by EMS. Difficulty breathing this morning. O2 saturation in the 60s on room air. EXAM: CT ANGIOGRAPHY CHEST WITH CONTRAST TECHNIQUE: Multidetector CT imaging of the chest was performed using the standard protocol during bolus administration of intravenous contrast. Multiplanar CT image reconstructions and MIPs were obtained to evaluate the vascular anatomy.  CONTRAST:  48mL  OMNIPAQUE IOHEXOL 350 MG/ML SOLN COMPARISON:  Chest x-ray on 01/05/2018 FINDINGS: Cardiovascular: The heart is enlarged. Pulmonary arteries are moderately well opacified by contrast bolus. No acute pulmonary embolus. There is minimal atherosclerotic calcification of the coronary arteries. Thoracic aorta is not aneurysmal. There is minimal atherosclerotic calcification of the great vessels. Mediastinum/Nodes: The visualized portion of the thyroid gland has a normal appearance. No mediastinal, hilar, or axillary adenopathy. Lungs/Pleura: Emphysematous changes are identified in the lung apices. There is minimal bibasilar atelectasis, RIGHT greater than LEFT. Small RIGHT pleural effusion. No suspicious pulmonary nodules. No pulmonary edema or consolidation. Upper Abdomen: There is a small amount of ascites. Musculoskeletal: There is deformity of the sternum consistent with remote fracture. Degenerative changes are seen in the thoracic spine. There is a chronic wedge compression fracture of T4. No suspicious lytic or blastic lesions are identified. Review of the MIP images confirms the above findings. IMPRESSION: 1. Emphysema Emphysema (ICD10-J43.9). 2. Small RIGHT pleural effusion and bibasilar atelectasis. 3. Cardiomegaly. 4. Technically adequate exam showing no acute pulmonary embolus. 5. Remote sternal fracture. Degenerative changes are seen in thoracic spine. Electronically Signed   By: Norva Pavlov M.D.   On: 01/05/2018 16:24   Dg Chest Port 1 View  Result Date: 01/06/2018 CLINICAL DATA:  Shortness of breath. EXAM: PORTABLE CHEST 1 VIEW COMPARISON:  01/05/2018. FINDINGS: The heart remains enlarged. Mild vascular congestion. Linear atelectasis in the LEFT mid lung zone. IMPRESSION: No change aeration. Mild vascular congestion. Early pulmonary edema not excluded. Electronically Signed   By: Elsie Stain M.D.   On: 01/06/2018 06:56     ASSESSMENT AND PLAN:   * Acute respiratory failure with hypoxia and  hypercapnia due to COPD exacerbation. -IV steroids, Antibiotics - Scheduled Nebulizers - Inhalers -Wean O2 as tolerated  * NSTEMI ASA, Statin Heparin drip Cath on Monday  * Hypertension.  Continue hypertension medication.  * Diabetes.  Start sliding scale.  Hold glipizide, continue Lantus 15 unit subcu daily.  * Acute on Chronic diastolic CHF ejection fraction 40 to 45% Started IV lasix 60 mg BID Monitor I/os Continue IV Lasix 1 more day and switch to orals tomorrow likely.  * Morbid obesity and OSA.  CPAP at night.    * Chronic thrombocytopenia.  Follow-up CBC while on heparin drip.  All the records are reviewed and case discussed with Care Management/Social Worker Management plans discussed with the patient, family and they are in agreement.  CODE STATUS: Partial code(DNI)  DVT Prophylaxis: SCDs  TOTAL TIME TAKING CARE OF THIS PATIENT: 35 minutes.   POSSIBLE D/C IN 2-3 DAYS, DEPENDING ON CLINICAL CONDITION.  Orie Fisherman M.D on 01/07/2018 at 1:13 PM  Between 7am to 6pm - Pager - [phone]  After 6pm go to [email or web address] - password [email or web address] [facility]  [facility] [place] [facility]  Office  [phone]  CC: Primary care physician; Cheron Schaumann., MD  Note: This dictation was prepared with Dragon dictation along with smaller phrase technology. Any transcriptional errors that result from this process are unintentional.

## 2018-01-07 NOTE — Progress Notes (Signed)
Progress Note  Patient Name: Brenda Wu Date of Encounter: 01/07/2018  Primary Cardiologist: New to [facility], [place] - consult by [facility]  Subjective   Sitting in bed pain bills this morning.  Remains on supplemental oxygen via nasal cannula.  Wearing CPAP at nighttime though not with naps.  Still not able to lay fully supine.  Platelet count has improved from 54-94-100.  Renal function stable.  Potassium 3.5.  Remains on heparin infusion.  Planning for possible cardiac catheterization on Monday, 01/09/2018 if patient is able to lay supine and labs are stable.  Inpatient Medications    Scheduled Meds: . aspirin EC  81 mg Oral Daily  . atorvastatin  40 mg Oral q1800  . budesonide (PULMICORT) nebulizer solution  0.25 mg Nebulization BID  . clopidogrel  75 mg Oral Daily  . diltiazem  60 mg Oral Daily  . famotidine  20 mg Oral BID  . ferrous sulfate  325 mg Oral Q breakfast  . furosemide  60 mg Intravenous BID  . insulin aspart  0-15 Units Subcutaneous TID WC  . insulin glargine  15 Units Subcutaneous Daily  . ipratropium-albuterol  3 mL Nebulization TID  . lamoTRIgine  200 mg Oral BID  . losartan  50 mg Oral Daily  . mouth rinse  15 mL Mouth Rinse BID  . methylPREDNISolone (SOLU-MEDROL) injection  40 mg Intravenous Q12H  . niacin  500 mg Oral Daily  . potassium chloride SA  20 mEq Oral Daily  . pregabalin  75 mg Oral BID  . sodium chloride flush  3 mL Intravenous Q12H  . tiotropium  18 mcg Inhalation Daily  . topiramate  200 mg Oral BID  . vitamin B-12  500 mcg Oral Daily   Continuous Infusions: . sodium chloride    . heparin 1,000 Units/hr (01/06/18 1655)   PRN Meds: sodium chloride, acetaminophen **OR** acetaminophen, albuterol, bisacodyl, guaiFENesin-dextromethorphan, HYDROcodone-acetaminophen, ondansetron **OR** ondansetron (ZOFRAN) IV, senna-docusate, sodium chloride flush   Vital Signs    Vitals:   01/06/18 2338 01/07/18 0448 01/07/18 0739 01/07/18 0817  BP:  123/78   132/78  Pulse:  78  82  Resp:    16  Temp:  (!) 97.5 F (36.4 C)  98.6 F (37 C)  TempSrc:  Oral  Oral  SpO2: 94% 93% 94% 97%  Weight:  107.7 kg    Height:        Intake/Output Summary (Last 24 hours) at 01/07/2018 1159 Last data filed at 01/07/2018 0700 Gross per 24 hour  Intake 946.55 ml  Output 2350 ml  Net -1403.45 ml   Filed Weights   01/05/18 1645 01/06/18 1128 01/07/18 0448  Weight: 109 kg 110.2 kg 107.7 kg    Telemetry    NSR - Personally Reviewed  ECG    n/a - Personally Reviewed  Physical Exam   GEN: No acute distress.   Neck: JVD difficult to assess secondary to body habitus. Cardiac: RRR, no murmurs, rubs, or gallops.  Respiratory:  Diminished breath sounds bilaterally.  Mild wheezing bilaterally. GI: Soft, nontender, non-distended.   MS: No edema; No deformity. Neuro:  Alert and oriented x 3; Nonfocal.  Psych: Normal affect.  Labs    Chemistry Recent Labs  Lab 01/05/18 1324 01/06/18 0434 01/07/18 0456  NA 146* 145 144  K 3.3* 3.9 3.5  CL 102 102 102  CO2 33* 30 32  GLUCOSE 146* 118* 120*  BUN 13 13 20   CREATININE 1.04* 1.06* 1.04*  CALCIUM 9.0  8.8* 8.6*  PROT 6.8 6.2*  --   ALBUMIN 4.1 3.4*  --   AST 26 20  --   ALT 20 18  --   ALKPHOS 134* 117  --   BILITOT 1.4* 1.6*  --   GFRNONAA 57* 55* 57*  GFRAA >60 >60 >60  ANIONGAP 11 13 10      Hematology Recent Labs  Lab 01/05/18 1324 01/06/18 0434 01/07/18 0500  WBC 7.2 4.2 8.4  RBC 5.75* 5.50* 5.09  HGB 17.5* 16.6* 15.5*  HCT 59.0* 56.3* 50.2*  MCV 102.6* 102.4* 98.6  MCH 30.4 30.2 30.5  MCHC 29.7* 29.5* 30.9  RDW 19.9* 19.4* 18.6*  PLT 54* 94* 100*    Cardiac Enzymes Recent Labs  Lab 01/05/18 1324 01/05/18 1720 01/05/18 2220 01/06/18 0434  TROPONINI 1.78* 1.51* 1.57* 1.50*   No results for input(s): TROPIPOC in the last 168 hours.   BNP Recent Labs  Lab 01/06/18 0434  BNP 816.0*     DDimer No results for input(s): DDIMER in the last 168 hours.    Radiology    Ct Angio Chest Pe W Or Wo Contrast  Result Date: 01/05/2018 IMPRESSION: 1. Emphysema Emphysema (ICD10-J43.9). 2. Small RIGHT pleural effusion and bibasilar atelectasis. 3. Cardiomegaly. 4. Technically adequate exam showing no acute pulmonary embolus. 5. Remote sternal fracture. Degenerative changes are seen in thoracic spine. Electronically Signed   By: Norva Pavlov M.D.   On: 01/05/2018 16:24   Dg Chest Port 1 View  Result Date: 01/06/2018 IMPRESSION: No change aeration. Mild vascular congestion. Early pulmonary edema not excluded. Electronically Signed   By: Elsie Stain M.D.   On: 01/06/2018 06:56   Dg Chest Portable 1 View  Result Date: 01/05/2018 IMPRESSION: Mild basilar atelectasis. No pneumonia or pleural effusion. Stable cardiomegaly. Electronically Signed   By: Dwyane Dee M.D.   On: 01/05/2018 12:31    Cardiac Studies   Echo 11/15/19L  Study Conclusions  - Left ventricle: The cavity size was severely dilated. Systolic   function was moderately reduced. The estimated ejection fraction   was 40%. Diffuse hypokinesis. Doppler parameters are consistent   with abnormal left ventricular relaxation (grade 1 diastolic   dysfunction). - Mitral valve: There was mild regurgitation. - Atrial septum: No defect or patent foramen ovale was identified.  Impressions:  - 4 chamber moderate to severe dilatation and moderate LV systolic   dyfunction with diffuse hypokinesis, mild MR/TR.  Patient Profile     62 y.o. female with history of severe COPD, remote tobacoo abuse, HFpEF, HTN, DM, and obesity, who was admitted 9/14 with chest pain/dyspnea and acute on chronic hypoxic respiratory failure wwith elevated troponin and mild LV dysfunction on echo.  Assessment & Plan    1.  Acute on chronic hypoxic and hypercapnic respiratory failure/AECOPD: -Improved with BiPAP -Now on supplemental oxygen via nasal cannula -Recommend ongoing CPAP usage anytime she  sleeps -Management per IM  2. NSTEMI: -Troponin highest upon arrival at 1.78 -This has been felt to be in the setting of supply demand ischemia but given her multiple risk factors for CAD including finding of mild LV dysfunction it is recommended she undergo ischemic evaluation with cardiac cath once her pulmonology issues are improved, she is able to lay supine, and her labs demonstrate stable platelet counts -She is a poor candidate for noninvasive stress testing given her body habitus -Unable to lay fully supine at this time -Platelet count is improving -Continue heparin, aspirin and Plavix -Not on beta-blocker  in setting of active wheezing -Lipitor  3.  Cardiomyopathy/combined systolic and diastolic CHF: -Ischemic evaluation as above prior to discharge -Remains on IV Lasix 60 mg twice daily with KCl repletion -Losartan -Not on beta-blocker as above with active wheezing  4.  Hypertension: -Well-controlled -Continue current medicines  5.  Morbid obesity with obstructive sleep apnea and likely OHS: -Recommend CPAP usage anytime she is sleeping whether this is at nighttime or with daily naps  For questions or updates, please contact [facility] Please consult [email or web address] for contact info under Cardiology/STEMI.    Signed, Eula Listen, PA-C [facility] Pager: [phone] 01/07/2018, 11:59 AM

## 2018-01-07 NOTE — Progress Notes (Signed)
ANTICOAGULATION CONSULT NOTE - Initial Consult  Pharmacy Consult for Heparin Indication: chest pain/ACS  Allergies  Allergen Reactions  . Metformin Other (See Comments)    Other reaction(s): Other (See Comments) Constipation, dry mouth, dizziness  . Bismuth Subsalicylate Rash    Pepto Bismol    Patient Measurements: Height: 5\' 5"  (165.1 cm) Weight: 237 lb 8 oz (107.7 kg) IBW/kg (Calculated) : 57 Heparin Dosing Weight: 77.77  Vital Signs: Temp: 97.5 F (36.4 C) (11/16 0448) Temp Source: Oral (11/16 0448) BP: 123/78 (11/16 0448) Pulse Rate: 78 (11/16 0448)  Labs: Recent Labs    01/05/18 1324 01/05/18 1520 01/05/18 1720 01/05/18 2220 01/06/18 0434 01/07/18 0456  HGB 17.5*  --   --   --  16.6*  --   HCT 59.0*  --   --   --  56.3*  --   PLT 54*  --   --   --  94*  --   APTT  --  27  --   --  100*  --   LABPROT  --  14.4  --   --  14.6  --   INR  --  1.13  --   --  1.15  --   HEPARINUNFRC  --   --   --  0.30 0.50 0.47  CREATININE 1.04*  --   --   --  1.06* 1.04*  TROPONINI 1.78*  --  1.51* 1.57* 1.50*  --     Estimated Creatinine Clearance: 69.3 mL/min (A) (by C-G formula based on SCr of 1.04 mg/dL (H)).   Medical History: Past Medical History:  Diagnosis Date  . CHF (congestive heart failure) (HCC)   . COPD (chronic obstructive pulmonary disease) (HCC)   . Depression   . Diabetes mellitus without complication (HCC)   . Hypercholesteremia   . Hypertension   . Seizures (HCC)   . Stroke [facility])     Medications:  Scheduled:  Infusions:  PRN:   Assessment: The patient presented to ED with worsening shortness of breath this morning.  She denies any fever or chills, no chest pain or palpitation or leg edema.  She is found in respiratory distress, O2 saturation was 60s in room air in the ED.  She is put on nonrebreather and put on BiPAP due to hypercapnia with PCO2 84.  Chest x-ray did not show any infiltrate or pulmonary edema.  Troponin level is elevated at  1.78. Concern for NSTEMI. Last note of patient being on a DOAC (rivaroxaban) was in 2018. Platelets are low (54) - called Dr. Imogene Burn and he is ok with continuing with the heparin due to trops being elevated (risk vs benefit).   Goal of Therapy:  Heparin level 0.3-0.7 units/ml Monitor platelets by anticoagulation protocol: Yes   Plan:  11/16  AM heparin level 0.47. Continue current heparin infusion rate of 1000 unit/hr. Recheck heparin level and CBC with tomorrow AM labs to confirm.  Gardner Candle, PharmD, BCPS Clinical Pharmacist 01/07/2018 5:48 AM

## 2018-01-08 DIAGNOSIS — E662 Morbid (severe) obesity with alveolar hypoventilation: Secondary | ICD-10-CM

## 2018-01-08 DIAGNOSIS — E118 Type 2 diabetes mellitus with unspecified complications: Secondary | ICD-10-CM

## 2018-01-08 LAB — GLUCOSE, CAPILLARY
GLUCOSE-CAPILLARY: 242 mg/dL — AB (ref 70–99)
GLUCOSE-CAPILLARY: 311 mg/dL — AB (ref 70–99)
GLUCOSE-CAPILLARY: 209 mg/dL — AB (ref 70–99)
Glucose-Capillary: 171 mg/dL — ABNORMAL HIGH (ref 70–99)

## 2018-01-08 LAB — BASIC METABOLIC PANEL
ANION GAP: 13 (ref 5–15)
BUN: 23 mg/dL (ref 8–23)
CO2: 32 mmol/L (ref 22–32)
Calcium: 8.6 mg/dL — ABNORMAL LOW (ref 8.9–10.3)
Chloride: 97 mmol/L — ABNORMAL LOW (ref 98–111)
Creatinine, Ser: 1.28 mg/dL — ABNORMAL HIGH (ref 0.44–1.00)
GFR, EST AFRICAN AMERICAN: 51 mL/min — AB (ref >60)
GFR, EST NON AFRICAN AMERICAN: 44 mL/min — AB (ref >60)
Glucose, Bld: 332 mg/dL — ABNORMAL HIGH (ref 70–99)
POTASSIUM: 3.7 mmol/L (ref 3.5–5.1)
SODIUM: 142 mmol/L (ref 135–145)

## 2018-01-08 LAB — CBC
HCT: 51.3 % — ABNORMAL HIGH (ref 36.0–46.0)
HEMOGLOBIN: 15.6 g/dL — AB (ref 12.0–15.0)
MCH: 30.3 pg (ref 26.0–34.0)
MCHC: 30.4 g/dL (ref 30.0–36.0)
MCV: 99.6 fL (ref 80.0–100.0)
Platelets: 99 10*3/uL — ABNORMAL LOW (ref 150–400)
RBC: 5.15 MIL/uL — ABNORMAL HIGH (ref 3.87–5.11)
RDW: 18.5 % — AB (ref 11.5–15.5)
WBC: 5.5 10*3/uL (ref 4.0–10.5)
nRBC: 0 % (ref 0.0–0.2)

## 2018-01-08 LAB — HEPARIN LEVEL (UNFRACTIONATED): Heparin Unfractionated: 0.47 IU/mL (ref 0.30–0.70)

## 2018-01-08 MED ORDER — PREDNISONE 20 MG PO TABS
40.0000 mg | ORAL_TABLET | Freq: Every day | ORAL | Status: DC
  Administered 2018-01-09 – 2018-01-10 (×2): 40 mg via ORAL
  Filled 2018-01-08 (×2): qty 2

## 2018-01-08 MED ORDER — LOSARTAN POTASSIUM 25 MG PO TABS
25.0000 mg | ORAL_TABLET | Freq: Every day | ORAL | Status: DC
  Administered 2018-01-09 – 2018-01-10 (×2): 25 mg via ORAL
  Filled 2018-01-08 (×2): qty 1

## 2018-01-08 MED ORDER — SODIUM CHLORIDE 0.9% FLUSH: 3.0000 mL | Freq: Two times a day (BID) | INTRAVENOUS | Status: DC

## 2018-01-08 MED ORDER — FUROSEMIDE 40 MG PO TABS: 40.0000 mg | ORAL_TABLET | Freq: Every day | ORAL | Status: DC

## 2018-01-08 MED ORDER — SODIUM CHLORIDE 0.9 % IV SOLN: 250.0000 mL | INTRAVENOUS | Status: DC | PRN

## 2018-01-08 MED ORDER — SODIUM CHLORIDE 0.9 % IV SOLN
INTRAVENOUS | Status: DC
  Administered 2018-01-09: 06:00:00 via INTRAVENOUS

## 2018-01-08 MED ORDER — SODIUM CHLORIDE 0.9% FLUSH: 3.0000 mL | INTRAVENOUS | Status: DC | PRN

## 2018-01-08 MED ORDER — FUROSEMIDE 40 MG PO TABS
40.0000 mg | ORAL_TABLET | Freq: Every day | ORAL | Status: DC
  Administered 2018-01-10: 40 mg via ORAL
  Filled 2018-01-08: qty 1

## 2018-01-08 MED ORDER — INSULIN GLARGINE 100 UNIT/ML ~~LOC~~ SOLN
18.0000 [IU] | Freq: Every day | SUBCUTANEOUS | Status: DC
  Administered 2018-01-09 – 2018-01-10 (×2): 18 [IU] via SUBCUTANEOUS
  Filled 2018-01-08 (×2): qty 0.18

## 2018-01-08 NOTE — Plan of Care (Signed)
Tolerating activity to bedside commode with minimal dyspnea on exertion.

## 2018-01-08 NOTE — Progress Notes (Signed)
[employer] [facility]   PATIENT NAME: Brenda Wu    MR#:  [id number]  DATE OF BIRTH:  1955/06/24  SUBJECTIVE:  CHIEF COMPLAINT:   Chief Complaint  Patient presents with  . Respiratory Distress   Continues to be on nasal cannula oxygen.  Still has mild wheezing.  No chest pain. Heparin drip. Afebrile. Husband at bedside  REVIEW OF SYSTEMS:    Review of Systems  Constitutional: Positive for malaise/fatigue. Negative for chills and fever.  HENT: Negative for sore throat.   Eyes: Negative for blurred vision, double vision and pain.  Respiratory: Positive for cough, shortness of breath and wheezing. Negative for hemoptysis.   Cardiovascular: Positive for leg swelling. Negative for chest pain, palpitations and orthopnea.  Gastrointestinal: Negative for abdominal pain, constipation, diarrhea, heartburn, nausea and vomiting.  Genitourinary: Negative for dysuria and hematuria.  Musculoskeletal: Negative for back pain and joint pain.  Skin: Negative for rash.  Neurological: Negative for sensory change, speech change, focal weakness and headaches.  Endo/Heme/Allergies: Does not bruise/bleed easily.  Psychiatric/Behavioral: Negative for depression. The patient is not nervous/anxious.     DRUG ALLERGIES:   Allergies  Allergen Reactions  . Metformin Other (See Comments)    Other reaction(s): Other (See Comments) Constipation, dry mouth, dizziness  . Bismuth Subsalicylate Rash    Pepto Bismol    VITALS:  Blood pressure 124/89, pulse 74, temperature 98.2 F (36.8 C), temperature source Oral, resp. rate 19, height 5\' 5"  (1.651 m), weight 108.4 kg, SpO2 97 %.  PHYSICAL EXAMINATION:   Physical Exam  GENERAL:  62 y.o.-year-old patient lying in the bed with audible wheezing EYES: Pupils equal, round, reactive to light and accommodation. No scleral icterus. Extraocular muscles intact.  HEENT: Head atraumatic, normocephalic. Oropharynx and  nasopharynx clear.  NECK:  Supple, no jugular venous distention. No thyroid enlargement, no tenderness.  LUNGS: Decreased air entry bilaterally.  Bilateral wheezing CARDIOVASCULAR: S1, S2 normal. No murmurs, rubs, or gallops.  ABDOMEN: Soft, nontender, nondistended. Bowel sounds present. No organomegaly or mass.  EXTREMITIES: No cyanosis, clubbing.  Bilateral lower extremity edema NEUROLOGIC: Cranial nerves II through XII are intact. No focal Motor or sensory deficits b/l.   PSYCHIATRIC: The patient is alert and awake SKIN: No obvious rash, lesion, or ulcer.   LABORATORY PANEL:   CBC Recent Labs  Lab 01/08/18 0417  WBC 5.5  HGB 15.6*  HCT 51.3*  PLT 99*   ------------------------------------------------------------------------------------------------------------------ Chemistries  Recent Labs  Lab 01/06/18 0434  01/08/18 0417  NA 145   < > 142  K 3.9   < > 3.7  CL 102   < > 97*  CO2 30   < > 32  GLUCOSE 118*   < > 332*  BUN 13   < > 23  CREATININE 1.06*   < > 1.28*  CALCIUM 8.8*   < > 8.6*  MG 2.0  --   --   AST 20  --   --   ALT 18  --   --   ALKPHOS 117  --   --   BILITOT 1.6*  --   --    < > = values in this interval not displayed.   ------------------------------------------------------------------------------------------------------------------  Cardiac Enzymes Recent Labs  Lab 01/06/18 0434  TROPONINI 1.50*   ------------------------------------------------------------------------------------------------------------------  RADIOLOGY:  No results found.   ASSESSMENT AND PLAN:   * Acute respiratory failure with hypoxia and hypercapnia due to COPD exacerbation. -Changed to p.o. prednisone from  tomorrow. - Scheduled Nebulizers - Inhalers -Wean O2 as tolerated  * NSTEMI ASA, Statin Heparin drip Cardiac catheterization tomorrow  * Hypertension.  Continue hypertension medication.  * Diabetes.  Start sliding scale.  Hold glipizide, continue  Lantus 15 unit subcu daily.  * Acute on Chronic diastolic CHF ejection fraction 40 to 45% Started IV lasix 60 mg BID--> changed to p.o. Lasix from tomorrow Monitor I/Os  * Morbid obesity and OSA.  CPAP at night.  * Chronic thrombocytopenia.  Follow-up CBC while on heparin drip.  All the records are reviewed and case discussed with Care Management/Social Worker Management plans discussed with the patient, family and they are in agreement.  CODE STATUS: Partial code(DNI)  DVT Prophylaxis: SCDs  TOTAL TIME TAKING CARE OF THIS PATIENT: 35 minutes.   POSSIBLE D/C IN 2-3 DAYS, DEPENDING ON CLINICAL CONDITION.  Orie Fisherman M.D on 01/08/2018 at 12:47 PM  Between 7am to 6pm - Pager - [phone]  After 6pm go to [email or web address] - password [email or web address] [facility]  [facility]  Office  [phone]  CC: Primary care physician; Cheron Schaumann., MD  Note: This dictation was prepared with Dragon dictation along with smaller phrase technology. Any transcriptional errors that result from this process are unintentional.

## 2018-01-08 NOTE — Progress Notes (Signed)
ANTICOAGULATION CONSULT NOTE - Initial Consult  Pharmacy Consult for Heparin Indication: chest pain/ACS  Allergies  Allergen Reactions  . Metformin Other (See Comments)    Other reaction(s): Other (See Comments) Constipation, dry mouth, dizziness  . Bismuth Subsalicylate Rash    Pepto Bismol    Patient Measurements: Height: 5\' 5"  (165.1 cm) Weight: 239 lb (108.4 kg) IBW/kg (Calculated) : 57 Heparin Dosing Weight: 77.77  Vital Signs: Temp: 98 F (36.7 C) (11/17 0436) Temp Source: Oral (11/17 0436) BP: 135/77 (11/17 0436) Pulse Rate: 76 (11/17 0436)  Labs: Recent Labs    01/05/18 1324 01/05/18 1520 01/05/18 1720  01/05/18 2220 01/06/18 0434 01/07/18 0456 01/07/18 0500 01/08/18 0417  HGB 17.5*  --   --   --   --  16.6*  --  15.5* 15.6*  HCT 59.0*  --   --   --   --  56.3*  --  50.2* 51.3*  PLT 54*  --   --   --   --  94*  --  100* 99*  APTT  --  27  --   --   --  100*  --   --   --   LABPROT  --  14.4  --   --   --  14.6  --   --   --   INR  --  1.13  --   --   --  1.15  --   --   --   HEPARINUNFRC  --   --   --    < > 0.30 0.50 0.47  --  0.47  CREATININE 1.04*  --   --   --   --  1.06* 1.04*  --   --   TROPONINI 1.78*  --  1.51*  --  1.57* 1.50*  --   --   --    < > = values in this interval not displayed.    Estimated Creatinine Clearance: 69.6 mL/min (A) (by C-G formula based on SCr of 1.04 mg/dL (H)).   Medical History: Past Medical History:  Diagnosis Date  . CHF (congestive heart failure) (HCC)   . COPD (chronic obstructive pulmonary disease) (HCC)   . Depression   . Diabetes mellitus without complication (HCC)   . Hypercholesteremia   . Hypertension   . Seizures (HCC)   . Stroke [facility])     Medications:  Scheduled:  Infusions:  PRN:   Assessment: The patient presented to ED with worsening shortness of breath this morning.  She denies any fever or chills, no chest pain or palpitation or leg edema.  She is found in respiratory distress, O2  saturation was 60s in room air in the ED.  She is put on nonrebreather and put on BiPAP due to hypercapnia with PCO2 84.  Chest x-ray did not show any infiltrate or pulmonary edema.  Troponin level is elevated at 1.78. Concern for NSTEMI. Last note of patient being on a DOAC (rivaroxaban) was in 2018. Platelets are low (54) - called Dr. Imogene Burn and he is ok with continuing with the heparin due to trops being elevated (risk vs benefit).   Goal of Therapy:  Heparin level 0.3-0.7 units/ml Monitor platelets by anticoagulation protocol: Yes   Plan:  11/17  AM heparin level 0.47. Continue current heparin infusion rate of 1000 unit/hr. Recheck heparin level and CBC with tomorrow AM labs per protocol.  Gardner Candle, PharmD, BCPS Clinical Pharmacist 01/08/2018 5:24 AM

## 2018-01-08 NOTE — Progress Notes (Signed)
Progress Note  Patient Name: Brenda Wu Date of Encounter: 01/08/2018  Primary Cardiologist: New to [facility] - consult by [facility]  Subjective   SOB slowly improving. No chest pain. Able to lay mostly supine with some SOB. Thinks she will be able to lay flat for cath with increased supplemental oxygen. Reports good UOP with IV Lasix. Documented UOP of 1 L for the past 24 hours with a net - 2.8 L for the admission. Weight 107.7-->108.4 kg. On IV Lasix 60 mg bid. BMET pending this morning. PLT count stable at 99 (improved from a low of 54). Remains on ASA, Plavix, and heparin gtt.   Inpatient Medications    Scheduled Meds: . aspirin EC  81 mg Oral Daily  . atorvastatin  40 mg Oral q1800  . budesonide (PULMICORT) nebulizer solution  0.25 mg Nebulization BID  . clopidogrel  75 mg Oral Daily  . diltiazem  60 mg Oral Daily  . famotidine  20 mg Oral BID  . ferrous sulfate  325 mg Oral Q breakfast  . furosemide  60 mg Intravenous BID  . insulin aspart  0-15 Units Subcutaneous TID WC  . insulin glargine  15 Units Subcutaneous Daily  . ipratropium-albuterol  3 mL Nebulization TID  . lamoTRIgine  200 mg Oral BID  . losartan  50 mg Oral Daily  . mouth rinse  15 mL Mouth Rinse BID  . methylPREDNISolone (SOLU-MEDROL) injection  40 mg Intravenous Q12H  . niacin  500 mg Oral Daily  . potassium chloride SA  20 mEq Oral Daily  . pregabalin  75 mg Oral BID  . sodium chloride flush  3 mL Intravenous Q12H  . tiotropium  18 mcg Inhalation Daily  . topiramate  200 mg Oral BID  . vitamin B-12  500 mcg Oral Daily   Continuous Infusions: . sodium chloride    . heparin 1,000 Units/hr (01/08/18 0444)   PRN Meds: sodium chloride, acetaminophen **OR** acetaminophen, albuterol, bisacodyl, guaiFENesin-dextromethorphan, HYDROcodone-acetaminophen, ondansetron **OR** ondansetron (ZOFRAN) IV, senna-docusate, sodium chloride flush   Vital Signs    Vitals:   01/07/18 2007 01/07/18 2029 01/08/18 0436  01/08/18 0737  BP:  112/67 135/77 124/89  Pulse:  83 76 74  Resp:  18 18 19   Temp:  98.2 F (36.8 C) 98 F (36.7 C) 98.2 F (36.8 C)  TempSrc:  Oral Oral Oral  SpO2: 94% 96% 97% 97%  Weight:   108.4 kg   Height:        Intake/Output Summary (Last 24 hours) at 01/08/2018 0849 Last data filed at 01/08/2018 0444 Gross per 24 hour  Intake 214.56 ml  Output 1250 ml  Net -1035.44 ml   Filed Weights   01/06/18 1128 01/07/18 0448 01/08/18 0436  Weight: 110.2 kg 107.7 kg 108.4 kg    Telemetry    NSR with rare PVC - Personally Reviewed  ECG    n/a - Personally Reviewed  Physical Exam   GEN: No acute distress.   Neck: JVD difficult to assess secondary to body habitus. Cardiac: RRR, no murmurs, rubs, or gallops.  Respiratory: Diminished breath sounds bilaterally. Improved wheezing.   GI: Soft, nontender, non-distended.   MS: No edema; No deformity. Neuro:  Alert and oriented x 3; Nonfocal.  Psych: Normal affect.  Labs    Chemistry Recent Labs  Lab 01/05/18 1324 01/06/18 0434 01/07/18 0456  NA 146* 145 144  K 3.3* 3.9 3.5  CL 102 102 102  CO2 33* 30 32  GLUCOSE 146* 118* 120*  BUN 13 13 20   CREATININE 1.04* 1.06* 1.04*  CALCIUM 9.0 8.8* 8.6*  PROT 6.8 6.2*  --   ALBUMIN 4.1 3.4*  --   AST 26 20  --   ALT 20 18  --   ALKPHOS 134* 117  --   BILITOT 1.4* 1.6*  --   GFRNONAA 57* 55* 57*  GFRAA >60 >60 >60  ANIONGAP 11 13 10      Hematology Recent Labs  Lab 01/06/18 0434 01/07/18 0500 01/08/18 0417  WBC 4.2 8.4 5.5  RBC 5.50* 5.09 5.15*  HGB 16.6* 15.5* 15.6*  HCT 56.3* 50.2* 51.3*  MCV 102.4* 98.6 99.6  MCH 30.2 30.5 30.3  MCHC 29.5* 30.9 30.4  RDW 19.4* 18.6* 18.5*  PLT 94* 100* 99*    Cardiac Enzymes Recent Labs  Lab 01/05/18 1324 01/05/18 1720 01/05/18 2220 01/06/18 0434  TROPONINI 1.78* 1.51* 1.57* 1.50*   No results for input(s): TROPIPOC in the last 168 hours.   BNP Recent Labs  Lab 01/06/18 0434  BNP 816.0*     DDimer No  results for input(s): DDIMER in the last 168 hours.   Radiology    No results found.  Cardiac Studies   Echo 11/15/19L  Study Conclusions  - Left ventricle: The cavity size was severely dilated. Systolic function was moderately reduced. The estimated ejection fraction was 40%. Diffuse hypokinesis. Doppler parameters are consistent with abnormal left ventricular relaxation (grade 1 diastolic dysfunction). - Mitral valve: There was mild regurgitation. - Atrial septum: No defect or patent foramen ovale was identified.  Impressions:  - 4 chamber moderate to severe dilatation and moderate LV systolic dyfunction with diffuse hypokinesis, mild MR/TR.  Patient Profile     62 y.o. female with history of severe COPD, remote tobacoo abuse, HFpEF, HTN, DM, and obesity, who was admitted 9/14 with chest pain/dyspnea and acute on chronic hypoxic respiratory failure wwith elevated troponin and mild LV dysfunction on echo.  Assessment & Plan    1. Acute on chronic hypoxic and hypercapnic respiratory failure/AECOPD: -Improved with BiPAP -Now on supplemental oxygen via nasal cannula -Recommend ongoing CPAP usage anytime she sleeps -Management per IM  2. NSTEMI: -Troponin highest upon arrival at 1.78 -This has been felt to be in the setting of supply demand ischemia but given her multiple risk factors for CAD including finding of mild LV dysfunction it is recommended she undergo ischemic evaluation with cardiac cath once her pulmonology issues are improved, she is able to lay supine, and her labs demonstrate stable platelet counts -She is a poor candidate for noninvasive stress testing given her body habitus -Able to lay mostly supine with supplemental oxygen -Trials of laying flat throughout the day today with added supplemental oxygen ir preparation for cath on 01/09/18 -NPO at midnight -Orders for LHC placed (on the board) -Platelet count is improved and stable at 99 this  morning  -Continue heparin, aspirin and Plavix -Not on beta-blocker in setting of active wheezing, though this is improving. Consider starting prior to discharge  -Lipitor -Risks and benefits of cardiac catheterization have been discussed with the patient including risks of bleeding, bruising, infection, kidney damage, stroke, heart attack, and death. The patient understands these risks and is willing to proceed with the procedure. All questions have been answered and concerns listened to  3.  Cardiomyopathy/combined systolic and diastolic CHF: -Ischemic evaluation as above prior to discharge -Remains on IV Lasix 60 mg twice daily with KCl repletion -Losartan -Not on  beta-blocker as above  -Check bmet to trend renal function   4.  Hypertension: -Well-controlled -Continue current medicines  5.  Morbid obesity with obstructive sleep apnea and likely OHS: -Recommend CPAP usage anytime she is sleeping whether this is at nighttime or with daily naps  6. Thrombocytopenia: -Improved from a low of 54 -Stable -Labs not consistent with HIT   For questions or updates, please contact [facility] Please consult [email or web address] for contact info under Cardiology/STEMI.    Signed, Eula Listen, PA-C [facility] Pager: [phone] 01/08/2018, 8:49 AM

## 2018-01-08 NOTE — Plan of Care (Signed)
Nutrition Education Note  RD consulted for nutrition education regarding CHF.  Met with patient and her husband at bedside. Patient reports her heart failure is fairly new and she has not previously learned about nutrition strategies to helping to manage heart failure. She has a good appetite and intake. She is finishing 100% of meals here. She reports she already has started limiting processed foods and they are choosing fresh or frozen foods at home. She also does not add salt during cooking or at the table.  RD provided "Heart Failure Nutrition Therapy" handout from the [employer] of [employer]. Reviewed patient's dietary recall. Provided examples on ways to decrease sodium intake in diet. Discouraged intake of processed foods and use of salt shaker. Encouraged fresh fruits and vegetables as well as whole grain sources of carbohydrates to maximize fiber intake.   RD discussed why it is important for patient to adhere to diet recommendations, and emphasized the role of fluids, foods to avoid, and importance of weighing self daily. Teach back method used.  Expect fair to good compliance.  Body mass index is 39.77 kg/m. Pt meets criteria for obesity class II based on current BMI.  Current diet order is Heart Healthy/Carbohydrate Modified, patient is consuming approximately 100% of meals at this time. Labs and medications reviewed. No further nutrition interventions warranted at this time. RD contact information provided. If additional nutrition issues arise, please re-consult RD.   Willey Blade, MS, [place], LDN Office: [phone] Pager: [phone] After Hours/Weekend Pager: [phone]

## 2018-01-09 ENCOUNTER — Encounter
Admission: EM | Disposition: A | Discharge status: Home Health | Payer: Self-pay | Source: Home / Self Care | Attending: Internal Medicine

## 2018-01-09 ENCOUNTER — Encounter: Payer: Self-pay | Admitting: Cardiovascular Disease

## 2018-01-09 HISTORY — PX: LEFT HEART CATH AND CORONARY ANGIOGRAPHY: CATH118249

## 2018-01-09 LAB — CBC
HCT: 50.5 % — ABNORMAL HIGH (ref 36.0–46.0)
HEMOGLOBIN: 15.4 g/dL — AB (ref 12.0–15.0)
MCH: 30.7 pg (ref 26.0–34.0)
MCHC: 30.5 g/dL (ref 30.0–36.0)
MCV: 100.6 fL — AB (ref 80.0–100.0)
NRBC: 0 % (ref 0.0–0.2)
PLATELETS: 93 10*3/uL — AB (ref 150–400)
RBC: 5.02 MIL/uL (ref 3.87–5.11)
RDW: 18.1 % — ABNORMAL HIGH (ref 11.5–15.5)
WBC: 6.7 10*3/uL (ref 4.0–10.5)

## 2018-01-09 LAB — GLUCOSE, CAPILLARY
GLUCOSE-CAPILLARY: 92 mg/dL (ref 70–99)
GLUCOSE-CAPILLARY: 407 mg/dL — AB (ref 70–99)
GLUCOSE-CAPILLARY: 368 mg/dL — AB (ref 70–99)
GLUCOSE-CAPILLARY: 196 mg/dL — AB (ref 70–99)
Glucose-Capillary: 113 mg/dL — ABNORMAL HIGH (ref 70–99)

## 2018-01-09 LAB — HEPARIN LEVEL (UNFRACTIONATED): Heparin Unfractionated: 0.19 IU/mL — ABNORMAL LOW (ref 0.30–0.70)

## 2018-01-09 SURGERY — LEFT HEART CATH AND CORONARY ANGIOGRAPHY
Anesthesia: Moderate Sedation

## 2018-01-09 MED ORDER — HEPARIN (PORCINE) IN NACL 1000-0.9 UT/500ML-% IV SOLN
INTRAVENOUS | Status: AC
  Filled 2018-01-09: qty 1000

## 2018-01-09 MED ORDER — SODIUM CHLORIDE 0.9% FLUSH: 3.0000 mL | Freq: Two times a day (BID) | INTRAVENOUS | Status: DC

## 2018-01-09 MED ORDER — MIDAZOLAM HCL 2 MG/2ML IJ SOLN
INTRAMUSCULAR | Status: AC
  Filled 2018-01-09: qty 2

## 2018-01-09 MED ORDER — SODIUM CHLORIDE 0.9 % IV SOLN: 250.0000 mL | INTRAVENOUS | Status: DC | PRN

## 2018-01-09 MED ORDER — MIDAZOLAM HCL 2 MG/2ML IJ SOLN
INTRAMUSCULAR | Status: DC | PRN
  Administered 2018-01-09: 1 mg via INTRAVENOUS

## 2018-01-09 MED ORDER — FENTANYL CITRATE (PF) 100 MCG/2ML IJ SOLN
INTRAMUSCULAR | Status: AC
  Filled 2018-01-09: qty 2

## 2018-01-09 MED ORDER — INSULIN ASPART 100 UNIT/ML ~~LOC~~ SOLN
12.0000 [IU] | Freq: Once | SUBCUTANEOUS | Status: AC
  Administered 2018-01-09: 12 [IU] via SUBCUTANEOUS
  Filled 2018-01-09: qty 1

## 2018-01-09 MED ORDER — VERAPAMIL HCL 2.5 MG/ML IV SOLN
INTRAVENOUS | Status: DC | PRN
  Administered 2018-01-09: 2.5 mg via INTRA_ARTERIAL

## 2018-01-09 MED ORDER — ENOXAPARIN SODIUM 40 MG/0.4ML ~~LOC~~ SOLN
40.0000 mg | Freq: Two times a day (BID) | SUBCUTANEOUS | Status: DC
  Administered 2018-01-10: 40 mg via SUBCUTANEOUS
  Filled 2018-01-09: qty 0.4

## 2018-01-09 MED ORDER — VERAPAMIL HCL 2.5 MG/ML IV SOLN
INTRAVENOUS | Status: AC
  Filled 2018-01-09: qty 2

## 2018-01-09 MED ORDER — HEPARIN SODIUM (PORCINE) 1000 UNIT/ML IJ SOLN
INTRAMUSCULAR | Status: DC | PRN
  Administered 2018-01-09: 5500 [IU] via INTRAVENOUS

## 2018-01-09 MED ORDER — HEPARIN SODIUM (PORCINE) 1000 UNIT/ML IJ SOLN
INTRAMUSCULAR | Status: AC
  Filled 2018-01-09: qty 1

## 2018-01-09 MED ORDER — IOPAMIDOL (ISOVUE-300) INJECTION 61%
INTRAVENOUS | Status: DC | PRN
  Administered 2018-01-09: 60 mL via INTRA_ARTERIAL

## 2018-01-09 MED ORDER — SODIUM CHLORIDE 0.9% FLUSH: 3.0000 mL | INTRAVENOUS | Status: DC | PRN

## 2018-01-09 MED ORDER — SODIUM CHLORIDE 0.9 % IV SOLN: INTRAVENOUS | Status: AC

## 2018-01-09 MED ORDER — HEPARIN BOLUS VIA INFUSION
2000.0000 [IU] | Freq: Once | INTRAVENOUS | Status: AC
  Administered 2018-01-09: 2000 [IU] via INTRAVENOUS
  Filled 2018-01-09: qty 2000

## 2018-01-09 SURGICAL SUPPLY — 6 items
CATH INFINITI 5FR JK (CATHETERS) ×2 IMPLANT
DEVICE RAD COMP TR BAND LRG (VASCULAR PRODUCTS) ×2 IMPLANT
GLIDESHEATH SLEND SS 6F .021 (SHEATH) ×2 IMPLANT
KIT MANI 3VAL PERCEP (MISCELLANEOUS) ×2 IMPLANT
PACK CARDIAC CATH (CUSTOM PROCEDURE TRAY) ×2 IMPLANT
WIRE ROSEN-J .035X260CM (WIRE) ×2 IMPLANT

## 2018-01-09 NOTE — Progress Notes (Signed)
Anticoagulation monitoring(Lovenox):  62 yo female ordered Lovenox 40 mg Q12h  Filed Weights   01/07/18 0448 01/08/18 0436 01/09/18 0353  Weight: 237 lb 8 oz (107.7 kg) 239 lb (108.4 kg) 241 lb 4.8 oz (109.5 kg)   BMI 40.15  Lab Results  Component Value Date   CREATININE 1.28 (H) 01/08/2018   CREATININE 1.04 (H) 01/07/2018   CREATININE 1.06 (H) 01/06/2018   Estimated Creatinine Clearance: 56.8 mL/min (A) (by C-G formula based on SCr of 1.28 mg/dL (H)). Hemoglobin & Hematocrit     Component Value Date/Time   HGB 15.4 (H) 01/09/2018 0528   HCT 50.5 (H) 01/09/2018 2951     Per Protocol for Patient with estCrcl > 30 ml/min and BMI >40, will transition to Lovenox 40 mg Q12h.

## 2018-01-09 NOTE — Progress Notes (Signed)
PT Cancellation Note  Patient Details Name: Brenda Wu MRN: [id number] DOB: September 21, 1955   Cancelled Treatment:    Reason Eval/Treat Not Completed: Patient at procedure or test/unavailable.  Pt currently off floor for procedure (per chart plan for cardiac cath today).  Will re-attempt PT treatment session at a later date/time.   Hendricks Limes, PT 01/09/18, 11:37 AM [phone]

## 2018-01-09 NOTE — Plan of Care (Signed)
No complaints while flat in bed in preparation for cardiac cath.  No distress noted.

## 2018-01-09 NOTE — Care Management Important Message (Signed)
Copy of signed IM left in patient's room (out for procedure). 

## 2018-01-09 NOTE — Progress Notes (Signed)
OT Cancellation Note  Patient Details Name: [name] MRN: [id number] DOB: 1955/12/02   Cancelled Treatment:    Reason Eval/Treat Not Completed: Patient at procedure or test/ unavailable Attempted to see patient twice this morning but unable due to having a cardiac catheterization procedure.  WiIll attempt again later today or tomorrow.  Susanne Borders, OTR/L ascom [phone] 01/09/18, 3:21 PM

## 2018-01-09 NOTE — Progress Notes (Signed)
fsbs 407- Dr. Nemiah Commander made aware/ will monitor

## 2018-01-09 NOTE — Progress Notes (Signed)
[employer] [facility] [facility] [place] [facility]   PATIENT NAME: Brenda Wu    MR#:  [id number]  DATE OF BIRTH:  September 16, 1955  SUBJECTIVE:  CHIEF COMPLAINT:   Chief Complaint  Patient presents with  . Respiratory Distress   -Came in with shortness of breath and being treated for COPD exacerbation -Elevated troponins, for cardiac cath today  REVIEW OF SYSTEMS:  Review of Systems  Constitutional: Negative for chills, fever and malaise/fatigue.  HENT: Negative for congestion, ear discharge, hearing loss and nosebleeds.   Respiratory: Positive for shortness of breath. Negative for cough and wheezing.   Cardiovascular: Positive for leg swelling. Negative for chest pain and palpitations.  Gastrointestinal: Negative for abdominal pain, constipation, diarrhea, nausea and vomiting.  Genitourinary: Negative for dysuria.  Musculoskeletal: Negative for myalgias.  Neurological: Negative for dizziness, focal weakness, seizures, weakness and headaches.  Psychiatric/Behavioral: Negative for depression.    DRUG ALLERGIES:   Allergies  Allergen Reactions  . Metformin Other (See Comments)    Other reaction(s): Other (See Comments) Constipation, dry mouth, dizziness  . Bismuth Subsalicylate Rash    Pepto Bismol    VITALS:  Blood pressure 122/78, pulse 76, temperature 97.9 F (36.6 C), temperature source Oral, resp. rate 18, height 5\' 5"  (1.651 m), weight 109.5 kg, SpO2 91 %.  PHYSICAL EXAMINATION:  Physical Exam   GENERAL:  62 y.o.-year-old obese patient lying in the bed with no acute distress.  EYES: Pupils equal, round, reactive to light and accommodation. No scleral icterus. Extraocular muscles intact.  HEENT: Head atraumatic, normocephalic. Oropharynx and nasopharynx clear.  NECK:  Supple, no jugular venous distention. No thyroid enlargement, no tenderness.  LUNGS: Normal breath sounds bilaterally, scattered expiratory wheezing present all over the lung fields, no  rales,rhonchi or crepitation. No use of accessory muscles of respiration.  CARDIOVASCULAR: S1, S2 normal. No rubs, or gallops.  2/6 systolic murmur is present ABDOMEN: Soft, nontender, nondistended. Bowel sounds present. No organomegaly or mass.  EXTREMITIES: No pedal edema, cyanosis, or clubbing.  NEUROLOGIC: Cranial nerves II through XII are intact. Muscle strength 5/5 in all extremities. Sensation intact. Gait not checked.  PSYCHIATRIC: The patient is alert and oriented x 3.  SKIN: No obvious rash, lesion, or ulcer.    LABORATORY PANEL:   CBC Recent Labs  Lab 01/09/18 0528  WBC 6.7  HGB 15.4*  HCT 50.5*  PLT 93*   ------------------------------------------------------------------------------------------------------------------  Chemistries  Recent Labs  Lab 01/06/18 0434  01/08/18 0417  NA 145   < > 142  K 3.9   < > 3.7  CL 102   < > 97*  CO2 30   < > 32  GLUCOSE 118*   < > 332*  BUN 13   < > 23  CREATININE 1.06*   < > 1.28*  CALCIUM 8.8*   < > 8.6*  MG 2.0  --   --   AST 20  --   --   ALT 18  --   --   ALKPHOS 117  --   --   BILITOT 1.6*  --   --    < > = values in this interval not displayed.   ------------------------------------------------------------------------------------------------------------------  Cardiac Enzymes Recent Labs  Lab 01/06/18 0434  TROPONINI 1.50*   ------------------------------------------------------------------------------------------------------------------  RADIOLOGY:  No results found.  EKG:   Orders placed or performed during the hospital encounter of 01/05/18  . ED EKG  . EKG 12-Lead  . EKG 12-Lead  . ED EKG  .  EKG    ASSESSMENT AND PLAN:   62 year old female with past medical history significant for COPD on 3 L home oxygen, hypertension, obesity, diabetes mellitus presented with chest pain and dyspnea  1.  Acute on chronic hypoxic and hypercarbic respiratory failure-secondary to COPD exacerbation -Was on  BiPAP initially, now weaned off to 3 L oxygen via nasal cannula -Continue CPAP at bedtime -Still has some wheezing on exam.  On oral prednisone, nebs and inhalers.  2.  Acute on chronic combined systolic and diastolic CHF exacerbation-diuresed well, Lasix on hold as creatinine starting to rise. -Restart Lasix tomorrow orally -Net negative by 3.6 L since admission -Echocardiogram with mild LV dysfunction and wall motion changes.  3.  Elevated troponin-could be an STEMI versus demand ischemia -Due to wall motion changes noted on echocardiogram and elevated troponin-patient is going for cardiac catheterization today -Appreciate cardiology consult -Already on aspirin, Plavix and on heparin drip now.  4.  Diabetes mellitus with hyperglycemia-secondary to use of steroids. -On Lantus, sliding scale insulin.  5.  Acute renal insufficiency-secondary to overdiuresis.  Hold Lasix today and monitor in a.m.  6.  DVT prophylaxis-on heparin drip  Husband updated at bedside Please ambulate patient today    All the records are reviewed and case discussed with Care Management/Social Workerr. Management plans discussed with the patient, family and they are in agreement.  CODE STATUS: Full Code  TOTAL TIME TAKING CARE OF THIS PATIENT: 38 minutes.   POSSIBLE D/C IN 1-2 DAYS, DEPENDING ON CLINICAL CONDITION.   [name] M.D on 01/09/2018 at 9:48 AM  Between 7am to 6pm - Pager - [phone]  After 6pm go to [email or web address] - password [email or web address]  [facility]  Office  [phone]  CC: Primary care physician; Cheron Schaumann., MD

## 2018-01-09 NOTE — Progress Notes (Signed)
Progress Note  Patient Name: Brenda Wu Date of Encounter: 01/09/2018  Primary Cardiologist: Julien Nordmann, MD   Subjective   Patient report reported she is able to lie flat for cardiac cath today. She was notably SOB during today's exam when speaking / responding to questions, so her bed was adjusted to full supine position this morning to ensure she was indeed able to lay flat for several minutes, and she did successfully.  She denies any current chest pain, palpitations, racing heart rate. Her main complaint is that she is thirsty at this time. She is also c/o ongoing left sided upper abdominal pain, ongoing since 11/13.   She is eager for cardiac catheterization today 11/18.  Most recent labs: Hg 15.4, Plts 93 K 3.7, Cr 1.28 -757.37mL, Wt 109.5kg  Inpatient Medications    Scheduled Meds: . aspirin EC  81 mg Oral Daily  . atorvastatin  40 mg Oral q1800  . budesonide (PULMICORT) nebulizer solution  0.25 mg Nebulization BID  . clopidogrel  75 mg Oral Daily  . diltiazem  60 mg Oral Daily  . famotidine  20 mg Oral BID  . ferrous sulfate  325 mg Oral Q breakfast  . [START ON 01/10/2018] furosemide  40 mg Oral Daily  . insulin aspart  0-15 Units Subcutaneous TID WC  . insulin glargine  18 Units Subcutaneous Daily  . ipratropium-albuterol  3 mL Nebulization TID  . lamoTRIgine  200 mg Oral BID  . losartan  25 mg Oral Daily  . mouth rinse  15 mL Mouth Rinse BID  . niacin  500 mg Oral Daily  . potassium chloride SA  20 mEq Oral Daily  . predniSONE  40 mg Oral Q breakfast  . pregabalin  75 mg Oral BID  . sodium chloride flush  3 mL Intravenous Q12H  . sodium chloride flush  3 mL Intravenous Q12H  . tiotropium  18 mcg Inhalation Daily  . topiramate  200 mg Oral BID  . vitamin B-12  500 mcg Oral Daily   Continuous Infusions: . sodium chloride    . sodium chloride    . sodium chloride 10 mL/hr at 01/09/18 0615  . heparin 1,200 Units/hr (01/09/18 0715)   PRN  Meds: sodium chloride, sodium chloride, acetaminophen **OR** acetaminophen, albuterol, bisacodyl, guaiFENesin-dextromethorphan, HYDROcodone-acetaminophen, ondansetron **OR** ondansetron (ZOFRAN) IV, senna-docusate, sodium chloride flush, sodium chloride flush   Vital Signs    Vitals:   01/08/18 1648 01/08/18 2028 01/09/18 0353 01/09/18 0734  BP: 122/78 135/72 123/88 122/78  Pulse: 75 84 79 76  Resp: 19 18 18 18   Temp: 98.2 F (36.8 C) 98 F (36.7 C) 98.3 F (36.8 C) 97.9 F (36.6 C)  TempSrc: Oral Oral Oral Oral  SpO2: 94% 95% 90% 91%  Weight:   109.5 kg   Height:        Intake/Output Summary (Last 24 hours) at 01/09/2018 0816 Last data filed at 01/09/2018 0615 Gross per 24 hour  Intake 242.92 ml  Output 1000 ml  Net -757.08 ml   Filed Weights   01/07/18 0448 01/08/18 0436 01/09/18 0353  Weight: 107.7 kg 108.4 kg 109.5 kg    Telemetry    SR 70-80s, rare PVCs - Personally Reviewed  ECG    No new tracings - Personally Reviewed  Physical Exam   GEN: Somewhat acute distress. SOB when attempting to talk for long periods of time. Neck: Mild JVD but difficult to assess d/t body habitus Cardiac: RRR, no murmurs, rubs, or  gallops.  Respiratory: Diffuse wheezing throughout lung fields, reduced bibasilar lung sounds.[place] O2. GI: Obese, tender to palpation on right side of abdomen (since Wednesday 9/13) MS: 3+ bilateral edema; No deformity. Neuro:  Nonfocal  Psych: Normal affect   Labs    Chemistry Recent Labs  Lab 01/05/18 1324 01/06/18 0434 01/07/18 0456 01/08/18 0417  NA 146* 145 144 142  K 3.3* 3.9 3.5 3.7  CL 102 102 102 97*  CO2 33* 30 32 32  GLUCOSE 146* 118* 120* 332*  BUN 13 13 20 23   CREATININE 1.04* 1.06* 1.04* 1.28*  CALCIUM 9.0 8.8* 8.6* 8.6*  PROT 6.8 6.2*  --   --   ALBUMIN 4.1 3.4*  --   --   AST 26 20  --   --   ALT 20 18  --   --   ALKPHOS 134* 117  --   --   BILITOT 1.4* 1.6*  --   --   GFRNONAA 57* 55* 57* 44*  GFRAA >60 >60 >60 51*   ANIONGAP 11 13 10 13      Hematology Recent Labs  Lab 01/07/18 0500 01/08/18 0417 01/09/18 0528  WBC 8.4 5.5 6.7  RBC 5.09 5.15* 5.02  HGB 15.5* 15.6* 15.4*  HCT 50.2* 51.3* 50.5*  MCV 98.6 99.6 100.6*  MCH 30.5 30.3 30.7  MCHC 30.9 30.4 30.5  RDW 18.6* 18.5* 18.1*  PLT 100* 99* 93*    Cardiac Enzymes Recent Labs  Lab 01/05/18 1324 01/05/18 1720 01/05/18 2220 01/06/18 0434  TROPONINI 1.78* 1.51* 1.57* 1.50*   No results for input(s): TROPIPOC in the last 168 hours.   BNP Recent Labs  Lab 01/06/18 0434  BNP 816.0*     DDimer No results for input(s): DDIMER in the last 168 hours.   Radiology    No results found.  Cardiac Studies   Echo 01/06/18 TTE Study Conclusions - Left ventricle: The cavity size was severely dilated. Systolic function was moderately reduced. The estimated ejection fraction was 40%. Diffuse hypokinesis. Doppler parameters are consistent with abnormal left ventricular relaxation (grade 1 diastolic dysfunction). - Mitral valve: There was mild regurgitation. - Atrial septum: No defect or patent foramen ovale was identified. Impressions: - 4 chamber moderate to severe dilatation and moderate LV systolic dyfunction with diffuse hypokinesis, mild MR/TR.  Patient Profile     62 y.o. female with history of severe COPD, remote tobacco abuse, A. fib, hypertension, DM, and obesity who was admitted 9/14 for chest pain/dyspnea and acute on chronic hypoxic respiratory failure with elevated troponin and mild LV dysfunction on echo.  Assessment & Plan    NSTEMI  - Troponin 1.78 on arrival  - Likely in the setting fo supply demand ischemia but given her multiple risk factors for CAD including smoking, DM, EF 40%, she will undergo cardiac cath today 11/18. She is able to lay supine for cath on exam this AM while on supplemental [place] oxygen.  - Poor candidate for noninvasive stress testing given body habitus - Platelet counts slightly  decreased from yesterday 99  93 - Continue heparin, ASA, plavix. Not on BB given pulmonary function / wheezing. Will reassess BB prior to discharge. Continue diltiazem, lipitor, losartan. Daily CBC as on heparin. NPO in preparation for cath. Coninue NPO status until after cath. - Further recommendations pending cardiac cath  Cardiomyopathy - Diuresing. Monitor I/O, daily weights, restrict fluids. Daily BMET to monitor renal functions and electrolytes. Continue to hold Lasix until after catheterization with potential restart  date of 11/19. Not on BB as above. Continue losartan. - If LHC does not show obstructive CAD, consider reduced EF 2/2 morbid obesity, hypoventilation, OSA. - Further recommendations pending ischemic evaluation  Acute on chronic hypoxic and hypercapnic respiratory failure / AECOPD - Improved with BiPAP. Encephalopathy on arrival 2/2 hypercapnic respiratory failure. On supplemental O2 via nasal cannula. Recommend ongoing CPAP.  - Home chronic O2 use  - Management per IM  HTN - Continue to monitor - Titrate antihypertensives listed above as needed - Per IM  Thrombocytopenia - Improved from 54 - Labs not consistent with HIT - Continue to monitor  Morbid obesity with OSA and likely OHS - CPAP recommended when sleeping at daytime - As above - Per IM   For questions or updates, please contact [facility] Please consult [email or web address] for contact info under        Signed, Lennon Alstrom, PA-C  01/09/2018, 8:16 AM

## 2018-01-09 NOTE — Progress Notes (Signed)
ANTICOAGULATION CONSULT NOTE - Initial Consult  Pharmacy Consult for Heparin Indication: chest pain/ACS  Allergies  Allergen Reactions  . Metformin Other (See Comments)    Other reaction(s): Other (See Comments) Constipation, dry mouth, dizziness  . Bismuth Subsalicylate Rash    Pepto Bismol    Patient Measurements: Height: 5\' 5"  (165.1 cm) Weight: 241 lb 4.8 oz (109.5 kg) IBW/kg (Calculated) : 57 Heparin Dosing Weight: 77.77  Vital Signs: Temp: 98.3 F (36.8 C) (11/18 0353) Temp Source: Oral (11/18 0353) BP: 123/88 (11/18 0353) Pulse Rate: 79 (11/18 0353)  Labs: Recent Labs    01/07/18 0456  01/07/18 0500 01/08/18 0417 01/09/18 0528  HGB  --    < > 15.5* 15.6* 15.4*  HCT  --   --  50.2* 51.3* 50.5*  PLT  --   --  100* 99* 93*  HEPARINUNFRC 0.47  --   --  0.47 0.19*  CREATININE 1.04*  --   --  1.28*  --    < > = values in this interval not displayed.    Estimated Creatinine Clearance: 56.8 mL/min (A) (by C-G formula based on SCr of 1.28 mg/dL (H)).   Medical History: Past Medical History:  Diagnosis Date  . CHF (congestive heart failure) (HCC)   . COPD (chronic obstructive pulmonary disease) (HCC)   . Depression   . Diabetes mellitus without complication (HCC)   . Hypercholesteremia   . Hypertension   . Seizures (HCC)   . Stroke [facility])     Medications:  Scheduled:  Infusions:  PRN:   Assessment: The patient presented to ED with worsening shortness of breath this morning.  She denies any fever or chills, no chest pain or palpitation or leg edema.  She is found in respiratory distress, O2 saturation was 60s in room air in the ED.  She is put on nonrebreather and put on BiPAP due to hypercapnia with PCO2 84.  Chest x-ray did not show any infiltrate or pulmonary edema.  Troponin level is elevated at 1.78. Concern for NSTEMI. Last note of patient being on a DOAC (rivaroxaban) was in 2018. Platelets are low (54) - called Dr. Imogene Burn and he is ok with continuing  with the heparin due to trops being elevated (risk vs benefit).   Goal of Therapy:  Heparin level 0.3-0.7 units/ml Monitor platelets by anticoagulation protocol: Yes   Plan:  11/18 AM heparin level 0.19. Will order heparin bolus of 2000 units and increase infusion rate to 1200 unit/hr. Recheck heparin level in 8 hours.  with tomorrow AM labs per protocol.  Gardner Candle, PharmD, BCPS Clinical Pharmacist 01/09/2018 7:07 AM

## 2018-01-09 NOTE — Care Management Note (Signed)
Case Management Note  Patient Details  Name: Brenda Wu MRN: [id number] Date of Birth: Feb 10, 1956  Subjective/Objective:       Patient is from home; lives alone.  Admitted with acute on chronic respiratory failure; COPD exacerbation.  She is on 2L chronic O2 at home and has an NIV through [facility].  She is open to [facility] for home health services.  She is current with her PCP; her medications are delivered via mail by [facility].  She does have part D coverage and states her medications that are not generic can get hard to afford.  She is independent in adl's.  S/P cardiac cath today.  Continues on IV antibiotics, Steroids and nebulizer's.  Will notifiy [facility] when patient discharges.             Action/Plan:   Expected Discharge Date:                  Expected Discharge Plan:  Home w Home Health Services  In-House Referral:     Discharge planning Services  CM Consult  Post Acute Care Choice:  Resumption of Svcs/PTA Provider Choice offered to:     DME Arranged:    DME Agency:     HH Arranged:  RN, PT, Social Work, Nurse's Aide HH Agency:  [facility]  Status of Service:  In process, will continue to follow  If discussed at Long Length of Stay Meetings, dates discussed:    Additional Comments:  Sherren Kerns, RN 01/09/2018, 3:12 PM

## 2018-01-09 NOTE — Progress Notes (Signed)
PT Cancellation Note  Patient Details Name: Brenda Wu MRN: [id number] DOB: May 07, 1955   Cancelled Treatment:    Reason Eval/Treat Not Completed: Patient at procedure or test/unavailable.  Pt still off floor for procedure.  Will re-attempt PT treatment session at a later date/time.  Hendricks Limes, PT 01/09/18, 2:15 PM [phone]

## 2018-01-10 LAB — BASIC METABOLIC PANEL
ANION GAP: 6 (ref 5–15)
BUN: 23 mg/dL (ref 8–23)
CHLORIDE: 102 mmol/L (ref 98–111)
CO2: 34 mmol/L — ABNORMAL HIGH (ref 22–32)
Calcium: 8.9 mg/dL (ref 8.9–10.3)
Creatinine, Ser: 0.99 mg/dL (ref 0.44–1.00)
GFR calc Af Amer: >60 mL/min (ref >60)
GFR calc non Af Amer: >60 mL/min (ref >60)
GLUCOSE: 155 mg/dL — AB (ref 70–99)
Potassium: 4 mmol/L (ref 3.5–5.1)
Sodium: 142 mmol/L (ref 135–145)

## 2018-01-10 LAB — GLUCOSE, CAPILLARY
GLUCOSE-CAPILLARY: 228 mg/dL — AB (ref 70–99)
Glucose-Capillary: 120 mg/dL — ABNORMAL HIGH (ref 70–99)

## 2018-01-10 LAB — CBC
HEMATOCRIT: 51.3 % — AB (ref 36.0–46.0)
HEMOGLOBIN: 15.4 g/dL — AB (ref 12.0–15.0)
MCH: 30.2 pg (ref 26.0–34.0)
MCHC: 30 g/dL (ref 30.0–36.0)
MCV: 100.6 fL — AB (ref 80.0–100.0)
NRBC: 0 % (ref 0.0–0.2)
Platelets: 96 10*3/uL — ABNORMAL LOW (ref 150–400)
RBC: 5.1 MIL/uL (ref 3.87–5.11)
RDW: 18.6 % — ABNORMAL HIGH (ref 11.5–15.5)
WBC: 6.2 10*3/uL (ref 4.0–10.5)

## 2018-01-10 MED ORDER — PREDNISONE 20 MG PO TABS: 20.0000 mg | ORAL_TABLET | Freq: Every day | ORAL | 0 refills | Status: DC

## 2018-01-10 MED ORDER — PREDNISONE 20 MG PO TABS: 20.0000 mg | ORAL_TABLET | Freq: Every day | ORAL | 0 refills | Status: AC

## 2018-01-10 NOTE — Discharge Summary (Addendum)
[facility] [place] at [facility]   PATIENT NAME: Brenda Wu    MR#:  [id number]  DATE OF BIRTH:  [birth year]  DATE OF ADMISSION:  01/05/2018   ADMITTING PHYSICIAN: Shaune Pollack, MD  DATE OF DISCHARGE: 01/10/18  PRIMARY CARE PHYSICIAN: Cheron Schaumann., MD   ADMISSION DIAGNOSIS:   CO2 narcosis [R06.89] Acute respiratory failure with hypoxia and hypercapnia (HCC) [J96.01, J96.02]  DISCHARGE DIAGNOSIS:   Acute hypoxic respiratory failure COPD exacerbation CHF exacerbation  SECONDARY DIAGNOSIS:   Past Medical History:  Diagnosis Date  . CHF (congestive heart failure) (HCC)   . COPD (chronic obstructive pulmonary disease) (HCC)   . Depression   . Diabetes mellitus without complication (HCC)   . Hypercholesteremia   . Hypertension   . Seizures (HCC)   . Stroke [facility])     HOSPITAL COURSE:   62 year old female with past medical history significant for COPD on 3 L home oxygen, hypertension, obesity, diabetes mellitus presented with chest pain and dyspnea  1.  Acute on chronic hypoxic and hypercarbic respiratory failure-secondary to COPD exacerbation -Was on BiPAP initially, now weaned off to 2 L oxygen via nasal cannula-2 L oxygen chronically at home -Continue CPAP at bedtime -Improved wheezing and breathing.  Discharged on oral prednisone taper, continue her nebs and inhalers  2.  Acute on chronic combined systolic and diastolic CHF exacerbation-diuresed well,  -Net negative by 3.6 L since admission -Echocardiogram with mild LV dysfunction and wall motion changes. -Restarted Lasix at home dose of 80 mg daily.  Educated about low-sodium diet  3.  Elevated troponin-likely demand ischemia from COPD and CHF and respiratory distress. -Appreciate cardiology consult.  Status post cardiac catheterization and no significant CAD noted. -Continue aspirin  4.  Diabetes mellitus with hyperglycemia-secondary to use of steroids. -Improving blood  sugars.  Patient on glipizide, and Lantus at home  5.  Acute renal insufficiency-secondary to overdiuresis.   -Lasix held for a day with improved creatinine.  Restarted home dose of Lasix.   Husband updated at bedside Patient to ambulate today and possible discharge this afternoon   DISCHARGE CONDITIONS:   Guarded  CONSULTS OBTAINED:   Treatment Team:  [name], MD Iran Ouch, MD  DRUG ALLERGIES:   Allergies  Allergen Reactions  . Metformin Other (See Comments)    Other reaction(s): Other (See Comments) Constipation, dry mouth, dizziness  . Bismuth Subsalicylate Rash    Pepto Bismol   DISCHARGE MEDICATIONS:   Allergies as of 01/10/2018      Reactions   Metformin Other (See Comments)   Other reaction(s): Other (See Comments) Constipation, dry mouth, dizziness   Bismuth Subsalicylate Rash   Pepto Bismol      Medication List    TAKE these medications   albuterol 108 (90 Base) MCG/ACT inhaler Commonly known as:  PROVENTIL HFA;VENTOLIN HFA Inhale 2 puffs into the lungs 4 (four) times daily as needed for wheezing or shortness of breath.   albuterol (2.5 MG/3ML) 0.083% nebulizer solution Commonly known as:  PROVENTIL Inhale 2.5 mg into the lungs every 6 (six) hours as needed for wheezing or shortness of breath.   atorvastatin 40 MG tablet Commonly known as:  LIPITOR Take 40 mg by mouth every evening.   diltiazem 180 MG 24 hr capsule Commonly known as:  CARDIZEM CD Take 180 mg by mouth daily.   ferrous sulfate 325 (65 FE) MG tablet Take 325 mg by mouth daily with breakfast.   furosemide 40 MG  tablet Commonly known as:  LASIX Take 2 tablets (80 mg total) by mouth daily.   glipiZIDE 10 MG tablet Commonly known as:  GLUCOTROL Take 10 mg by mouth 2 (two) times daily before a meal.   GOODSENSE ASPIRIN 325 MG tablet Generic drug:  aspirin Take 325 mg by mouth daily.   guaiFENesin 600 MG 12 hr tablet Commonly known as:  MUCINEX Take 600  mg by mouth 2 (two) times daily.   JANUVIA 50 MG tablet Generic drug:  sitaGLIPtin Take 50 mg by mouth daily.   lamoTRIgine 200 MG tablet Commonly known as:  LAMICTAL Take 200 mg by mouth 2 (two) times daily.   LANTUS SOLOSTAR 100 UNIT/ML Solostar Pen Generic drug:  Insulin Glargine Inject 15 Units into the skin daily.   losartan 50 MG tablet Commonly known as:  COZAAR Take 50 mg by mouth daily.   meloxicam 7.5 MG tablet Commonly known as:  MOBIC Take 7.5 mg by mouth daily.   MULTI-VITAMINS Tabs Take 1 tablet by mouth daily.   niacin 500 MG tablet Take 500 mg by mouth daily.   potassium chloride SA 20 MEQ tablet Commonly known as:  K-DUR,KLOR-CON Take 1 tablet (20 mEq total) by mouth daily.   predniSONE 20 MG tablet Commonly known as:  DELTASONE Take 1 tablet (20 mg total) by mouth daily for 3 days.   pregabalin 75 MG capsule Commonly known as:  LYRICA Take 75 mg by mouth 2 (two) times daily.   senna 8.6 MG Tabs tablet Commonly known as:  SENOKOT Take 1 tablet by mouth daily as needed for mild constipation or moderate constipation.   SYMBICORT 160-4.5 MCG/ACT inhaler Generic drug:  budesonide-formoterol Inhale 2 puffs into the lungs 2 (two) times daily.   tiotropium 18 MCG inhalation capsule Commonly known as:  SPIRIVA Place 18 mcg into inhaler and inhale daily.   topiramate 200 MG tablet Commonly known as:  TOPAMAX Take 200 mg by mouth 2 (two) times daily.   vitamin B-12 500 MCG tablet Commonly known as:  CYANOCOBALAMIN Take 500 mcg by mouth daily.        DISCHARGE INSTRUCTIONS:   1.  PCP follow-up in 1 to 2 weeks 2.  Cardiology follow-up in 2 weeks  DIET:   Cardiac diet  ACTIVITY:   Activity as tolerated  OXYGEN:   Home Oxygen: Yes.    Oxygen Delivery: 2 liters/min via Patient connected to nasal cannula oxygen  DISCHARGE LOCATION:   home   If you experience worsening of your admission symptoms, develop shortness of breath, life  threatening emergency, suicidal or homicidal thoughts you must seek medical attention immediately by calling 911 or calling your MD immediately  if symptoms less severe.  You Must read complete instructions/literature along with all the possible adverse reactions/side effects for all the Medicines you take and that have been prescribed to you. Take any new Medicines after you have completely understood and accpet all the possible adverse reactions/side effects.   Please note  You were cared for by a hospitalist during your hospital stay. If you have any questions about your discharge medications or the care you received while you were in the hospital after you are discharged, you can call the unit and asked to speak with the hospitalist on call if the hospitalist that took care of you is not available. Once you are discharged, your primary care physician will handle any further medical issues. Please note that NO REFILLS for any discharge medications will  be authorized once you are discharged, as it is imperative that you return to your primary care physician (or establish a relationship with a primary care physician if you do not have one) for your aftercare needs so that they can reassess your need for medications and monitor your lab values.    On the day of Discharge:  VITAL SIGNS:   Blood pressure 122/82, pulse 72, temperature 98.6 F (37 C), temperature source Oral, resp. rate 19, height 5\' 5"  (1.651 m), weight 110.5 kg, SpO2 98 %.  PHYSICAL EXAMINATION:   GENERAL:  62 y.o.-year-old obese patient lying in the bed with no acute distress.  EYES: Pupils equal, round, reactive to light and accommodation. No scleral icterus. Extraocular muscles intact.  HEENT: Head atraumatic, normocephalic. Oropharynx and nasopharynx clear.  NECK:  Supple, no jugular venous distention. No thyroid enlargement, no tenderness.  LUNGS: Normal breath sounds bilaterally, minimal expiratory wheezing present today,  no rales,rhonchi or crepitation. No use of accessory muscles of respiration.  CARDIOVASCULAR: S1, S2 normal. No rubs, or gallops.  2/6 systolic murmur is present ABDOMEN: Soft, nontender, nondistended. Bowel sounds present. No organomegaly or mass.  EXTREMITIES: No pedal edema, cyanosis, or clubbing.  NEUROLOGIC: Cranial nerves II through XII are intact. Muscle strength 5/5 in all extremities. Sensation intact. Gait not checked.  PSYCHIATRIC: The patient is alert and oriented x 3.  SKIN: No obvious rash, lesion, or ulcer.    DATA REVIEW:   CBC Recent Labs  Lab 01/10/18 0418  WBC 6.2  HGB 15.4*  HCT 51.3*  PLT 96*    Chemistries  Recent Labs  Lab 01/06/18 0434  01/10/18 0418  NA 145   < > 142  K 3.9   < > 4.0  CL 102   < > 102  CO2 30   < > 34*  GLUCOSE 118*   < > 155*  BUN 13   < > 23  CREATININE 1.06*   < > 0.99  CALCIUM 8.8*   < > 8.9  MG 2.0  --   --   AST 20  --   --   ALT 18  --   --   ALKPHOS 117  --   --   BILITOT 1.6*  --   --    < > = values in this interval not displayed.     Microbiology Results  Results for orders placed or performed during the hospital encounter of 01/05/18  MRSA PCR Screening     Status: None   Collection Time: 01/05/18  5:04 PM  Result Value Ref Range Status   MRSA by PCR NEGATIVE NEGATIVE Final    Comment:        The GeneXpert MRSA Assay (FDA approved for NASAL specimens only), is one component of a comprehensive MRSA colonization surveillance program. It is not intended to diagnose MRSA infection nor to guide or monitor treatment for MRSA infections. Performed at [facility], [address], [place], [place] [address]     RADIOLOGY:  No results found.   Management plans discussed with the patient, family and they are in agreement.  CODE STATUS:     Code Status Orders  (From admission, onward)         Start     Ordered   01/05/18 1720  Limited resuscitation (code)  Continuous    Question Answer  Comment  In the event of cardiac or respiratory ARREST: Initiate Code Blue, Call Rapid Response Yes  In the event of cardiac or respiratory ARREST: Perform CPR Yes   In the event of cardiac or respiratory ARREST: Perform Intubation/Mechanical Ventilation No   In the event of cardiac or respiratory ARREST: Use NIPPV/BiPAp only if indicated Yes   In the event of cardiac or respiratory ARREST: Administer ACLS medications if indicated Yes   In the event of cardiac or respiratory ARREST: Perform Defibrillation or Cardioversion if indicated Yes      01/05/18 1719        Code Status History    Date Active Date Inactive Code Status Order ID Comments User Context   01/05/2018 1716 01/05/2018 1719 Full Code [id number]  Shaune Pollack, MD Inpatient   11/23/2017 1506 11/25/2017 1632 Full Code [id number]  Milagros Loll, MD ED   11/04/2017 1538 11/09/2017 1335 Full Code [id number]  Adrian Saran, MD ED   02/07/2017 0909 02/12/2017 1620 Full Code [id number]  Shaune Pollack, MD Inpatient   11/01/2016 1726 11/04/2016 1447 Full Code [id number]  Gracelyn Nurse, MD Inpatient      TOTAL TIME TAKING CARE OF THIS PATIENT: 38 minutes.    [name] M.D on 01/10/2018 at 9:46 AM  Between 7am to 6pm - Pager - [phone]  After 6pm go to [email or web address] - Social research officer, government  [facility] [place] [facility]  Office  [phone]  CC: Primary care physician; Cheron Schaumann., MD   Note: This dictation was prepared with Dragon dictation along with smaller phrase technology. Any transcriptional errors that result from this process are unintentional.

## 2018-01-10 NOTE — Progress Notes (Signed)
Pt discharged to home via wc.  Instructions  given to pt.  Questions answered.  No distress.  

## 2018-01-10 NOTE — Progress Notes (Addendum)
Cardiovascular and Pulmonary Nurse Navigator Note:    62 year old female with PMHx significant for COPD on 3 liters of oxygen at home, HTN, obesity, DM who presented with c/o chest pain and dyspnea.    Active Problems this Admission: 1. Acute on chronic hypoxic and hypercarbic respiratory failure secondary to COPD exacerbation.  Wears CPAP at home.   2. Acute on chronic combined systolic and diastolic CHF exacerbation 3. Elevated troponin - likely secondary to COPD, CHF, and respiratory distress.   4. DM 5. Acute renal insufficiency  CHF Education:?? Educational session with patient and husband completed.  Husband did not comment and was not engaged in the conversation.  Noted patient had trouble finding her words when replying to my questions. This continued for the entire time this RN was at patient's beside.   Discussed this with patient's assigned RN, RNCM, as well as Dr. Nemiah Commander.    Patient informed this RN she already has a "Living Better with Heart Failure" packet at home.   Briefly reviewed definition of heart failure and signs and symptoms of an exacerbation.?Explained to patient that HF is a chronic illness which requires self-assessment / self-management along with help from the cardiologist/PCP/[facility].  ?? ? *Reviewed importance of and reason behind checking weight daily in the AM, after using the bathroom, but before getting dressed.  Patient was able to state all the data she collects every morning - BP, Pulse Ox, Blood Sugar, and Weight.  Again, patient had trouble explaining.    Patient informed this RN her scales are not working properly and she needs a new set.  Patient states she does not have means to purchase scales herself.  RNCM notified of patient's need for a scale.  RNCM supplied patient with new scale.   ? *Reviewed with patient the following information: *Discussed when to call the Dr= weight gain of >2-3lb overnight of 5lb in a week,  *Discussed  yellow zone= call MD: weight gain of >2-3lb overnight of 5lb in a week, increased swelling, increased SOB when lying down, chest discomfort, dizziness, increased fatigue *Red Zone= call 911: struggle to breath, fainting or near fainting, significant chest pain   *Reviewed low sodium diet - discussed foods to avoid.   ? *Discussed fluid intake with patient as well. Patient not currently on a fluid restriction, but advised no more than 64 ounces of fluids per day.Demonstrated this amount using the bedside water pitcher.   ? *Instructed patient to take medications as prescribed for heart failure. Explained briefly why pt is on the medications (either make you feel better, live longer or keep you out of the hospital) and discussed monitoring and side effects.  ? *Discussed activity / exercise.  Patient being followed by [facility] - PT, SW, RN.   ? *Smoking Cessation- Patient is a former smoker.? ? *[facility] - Patient was first seen in the [facility] on 12/12/2017.  Although patient denies being seen.  Patient informed this RN that she has not ever been to [facility].  Husband did not comment.  Patient informed of her next appointment on Monday, 01/16/2018 at 2:40 p.m.    Other f/u appointments:  PCP - Dr. Alwyn Ren on 01/17/2018 at 9:40 a.m.  Cardiology - Nicolasa Ducking, NP on 01/26/2018 at 1:30 p.m.    [facility] to provide ongoing CHF education and monitoring.    Again, the 5 Steps to Living Better with Heart  Failure were reviewed with patient.   Patient thanked me for providing the above information. ? ? Army Melia, RN, BSN, [facility]? [facility]  [facility] Cardiac &?Pulmonary Rehab  Cardiovascular &?Pulmonary Nurse Navigator  Direct Line: [phone]  Department Phone #: [phone] Fax: [phone]? Email Address: [email or web address]

## 2018-01-10 NOTE — Care Management (Signed)
Scale provided by [facility]  for patient to take home at discharge.

## 2018-01-10 NOTE — Care Management Note (Signed)
Case Management Note  Patient Details  Name: Brenda Wu MRN: [id number] Date of Birth: May 29, 1955  Subjective/Objective:     Discharging today with resumption of home health through [facility].  Barbara Cower with [facility] is aware.  Adding on SW to home health services.  Has follow appointments scheduled with the heart failure clinic at [facility].         Action/Plan:   Expected Discharge Date:  01/10/18               Expected Discharge Plan:  Home w Home Health Services  In-House Referral:     Discharge planning Services  CM Consult  Post Acute Care Choice:  Resumption of Svcs/PTA Provider Choice offered to:     DME Arranged:    DME Agency:     HH Arranged:  RN, PT, Social Work, Nurse's Aide HH Agency:  [facility]  Status of Service:  Completed, signed off  If discussed at [employer], dates discussed:    Additional Comments:  Sherren Kerns, RN 01/10/2018, 10:08 AM

## 2018-01-16 ENCOUNTER — Ambulatory Visit: Payer: Medicare HMO | Admitting: Family

## 2018-01-22 NOTE — Progress Notes (Signed)
Patient ID: Brenda Wu, female    DOB: 1956/01/02, 62 y.o.   MRN: [id number]  HPI  Brenda Wu is a 62 y/o female with a history of DM, hyperlipidemia, , stroke, depression, HTN, seizures, COPD, previous tobacco use and chronic heart failure.   Echo report from 01/06/18 reviewed and showed an EF of 40% along with mild MR. Echo report from 11/05/17 reviewed and showed an EF of 45-50% along with mild/moderate MR/ TR and normal PA pressure.   Admitted 01/05/18 due to COPD/HF exacerbation. Initially on bipap and then weaned down to home O2. Cardiology consult obtained. Lost ~ 3.6L of fluid. Elevated troponin thought to be due to demand ischemia. Discharged after 5 days. Admitted 11/23/17 due to COPD exacerbation along with HF. Started on bipap and symptoms improved. Given solu-medrol and IV lasix and then transitioned to oral medications. Discharged after 2 days. Admitted 11/04/17 due to acute on chronic respiratory failure due to pneumonia and COPD/HF exacerbation. Initially needed bipap and was weaned back to oxygen at 2L nasal cannula. Treated with antibiotics along with IV solu-medrol. Mildly elevated troponin thought to be due to demand ischemia. Discharged after 5 days.   She presents today for a follow-up visit with a chief complaint of minimal fatigue upon moderate exertion. She describes this as chronic in nature having been present for several months. She has associated right hip/thigh pain and pedal edema along with this. She denies any difficulty sleeping, abdominal distention, palpitations, chest pain, shortness of breath, dizziness or weight gain. Awake and talking during visit today and last time, she was falling asleep often.   Past Medical History:  Diagnosis Date  . CHF (congestive heart failure) (HCC)   . COPD (chronic obstructive pulmonary disease) (HCC)   . Depression   . Diabetes mellitus without complication (HCC)   . Hypercholesteremia   . Hypertension   . Seizures (HCC)    . Stroke [facility])    Past Surgical History:  Procedure Laterality Date  . LEFT HEART CATH AND CORONARY ANGIOGRAPHY N/A 01/09/2018   Procedure: LEFT HEART CATH AND CORONARY ANGIOGRAPHY;  Surgeon: Iran Ouch, MD;  Location: [facility] INVASIVE CV LAB;  Service: Cardiovascular;  Laterality: N/A;  . REPLACEMENT TOTAL KNEE     Family History  Problem Relation Age of Onset  . Hypertension Father   . Asthma Sister   . Heart murmur Sister   . Diabetes Sister    Social History   Tobacco Use  . Smoking status: Former Smoker    Packs/day: 1.00    Years: 30.00    Pack years: 30.00    Types: Cigarettes  . Smokeless tobacco: Never Used  Substance Use Topics  . Alcohol use: No   Allergies  Allergen Reactions  . Metformin Other (See Comments)    Other reaction(s): Other (See Comments) Constipation, dry mouth, dizziness  . Bismuth Subsalicylate Rash    Pepto Bismol   Prior to Admission medications   Medication Sig Start Date End Date Taking? Authorizing Provider  albuterol (PROVENTIL HFA;VENTOLIN HFA) 108 (90 Base) MCG/ACT inhaler Inhale 2 puffs into the lungs 4 (four) times daily as needed for wheezing or shortness of breath.  01/05/16  Yes [provider]  albuterol (PROVENTIL) (2.5 MG/3ML) 0.083% nebulizer solution Inhale 2.5 mg into the lungs every 6 (six) hours as needed for wheezing or shortness of breath.  10/01/16  Yes [provider]  aspirin (GOODSENSE ASPIRIN) 325 MG tablet Take 325 mg by mouth daily. 01/31/08  Yes [provider]  atorvastatin (LIPITOR) 40 MG tablet Take 40 mg by mouth every evening.    Yes [provider]  budesonide-formoterol (SYMBICORT) 160-4.5 MCG/ACT inhaler Inhale 2 puffs into the lungs 2 (two) times daily. 05/22/15  Yes [provider]  diltiazem (CARDIZEM CD) 180 MG 24 hr capsule Take 180 mg by mouth daily.   Yes [provider]  ferrous sulfate 325 (65 FE) MG tablet Take 325 mg by mouth daily with  breakfast.   Yes [provider]  furosemide (LASIX) 40 MG tablet Take 2 tablets (80 mg total) by mouth daily. 11/08/17 11/08/18 Yes Salary, Evelena Asa, MD  glipiZIDE (GLUCOTROL) 10 MG tablet Take 10 mg by mouth 2 (two) times daily before a meal.    Yes [provider]  guaiFENesin (MUCINEX) 600 MG 12 hr tablet Take 600 mg by mouth 2 (two) times daily as needed.    Yes [provider]  Insulin Glargine (LANTUS SOLOSTAR) 100 UNIT/ML Solostar Pen Inject 15 Units into the skin daily. 01/21/17  Yes [provider]  lamoTRIgine (LAMICTAL) 200 MG tablet Take 200 mg by mouth 2 (two) times daily.    Yes [provider]  losartan (COZAAR) 50 MG tablet Take 50 mg by mouth daily.   Yes [provider]  Multiple Vitamin (MULTI-VITAMINS) TABS Take 1 tablet by mouth daily.   Yes [provider]  niacin 500 MG tablet Take 500 mg by mouth daily.   Yes [provider]  potassium chloride SA (K-DUR,KLOR-CON) 20 MEQ tablet Take 1 tablet (20 mEq total) by mouth daily. 02/12/17  Yes [name], MD  pregabalin (LYRICA) 75 MG capsule Take 75 mg by mouth 2 (two) times daily.   Yes [provider]  senna (SENOKOT) 8.6 MG TABS tablet Take 1 tablet by mouth daily.    Yes [provider]  sitaGLIPtin (JANUVIA) 50 MG tablet Take 50 mg by mouth daily.   Yes [provider]  tiotropium (SPIRIVA) 18 MCG inhalation capsule Place 18 mcg into inhaler and inhale daily.   Yes [provider]  topiramate (TOPAMAX) 200 MG tablet Take 200 mg by mouth 2 (two) times daily.   Yes [provider]  vitamin B-12 (CYANOCOBALAMIN) 500 MCG tablet Take 500 mcg by mouth daily.   Yes [provider]    Review of Systems  Constitutional: Positive for fatigue. Negative for appetite change.  HENT: Negative for congestion, postnasal drip and sore throat.   Eyes: Negative.   Respiratory: Negative for cough, shortness of  breath and wheezing.   Cardiovascular: Positive for leg swelling. Negative for chest pain and palpitations.  Gastrointestinal: Negative for abdominal distention and abdominal pain.  Endocrine: Negative.   Genitourinary: Negative.   Musculoskeletal: Positive for arthralgias (pain in right groin/ right thigh). Negative for back pain and neck pain.  Skin: Negative.   Allergic/Immunologic: Negative.   Neurological: Negative for dizziness and light-headedness.  Hematological: Negative for adenopathy. Does not bruise/bleed easily.  Psychiatric/Behavioral: Negative for dysphoric mood and sleep disturbance (wearing oxygen @2L  and bipap). The patient is not nervous/anxious.    Vitals:   01/24/18 0931  BP: 121/70  Pulse: 79  Resp: 18  SpO2: 98%  Weight: 230 lb (104.3 kg)  Height: 5\' 4"  (1.626 m)   Wt Readings from Last 3 Encounters:  01/24/18 230 lb (104.3 kg)  01/10/18 243 lb 8 oz (110.5 kg)  12/12/17 250 lb 4 oz (113.5 kg)   Lab Results  Component Value Date   CREATININE 0.99 01/10/2018   CREATININE 1.28 (H) 01/08/2018   CREATININE 1.04 (H) 01/07/2018   Physical Exam  Constitutional: She is oriented to person, place, and time. She appears well-developed and well-nourished.  HENT:  Head: Normocephalic and atraumatic.  Neck: Normal range of motion. Neck supple. No JVD present.  Cardiovascular: Normal rate and regular rhythm.  Pulmonary/Chest: Effort normal. No respiratory distress. She has no wheezes. She has no rales.  Abdominal: Soft. She exhibits no distension.  Musculoskeletal:       Right lower leg: She exhibits edema (1+pitting ). She exhibits no tenderness.       Left lower leg: She exhibits edema (1+ pitting). She exhibits no tenderness.  Neurological: She is oriented to person, place, and time.  Skin: Skin is warm and dry.  Psychiatric: Her mood appears not anxious. She is not agitated.  Nursing note and vitals reviewed.   Assessment & Plan:  1: Chronic heart failure  with mildly reduced ejection fraction- - NYHA class II - minimally fluid overloaded today - weighing daily and she says that her weight has been stable. Reviewed the importance of calling for an overnight weight gain of >2 pounds or a weekly weight gain of >5 pounds - weight down 20 pounds from last visit 6 weeks ago - not adding salt to her food. Reviewed the importance of closely following a 2000mg  sodium diet  - EF mildly reduced so would not qualify for entresto - elevate legs when sitting for long periods of time  - wearing compression socks daily with removal at bedtime - she says that she's received her flu vaccine already - BNP 01/06/18 was 816.0 - PharmD reconciled medications with patient  2: HTN- - BP looks good today - BMP done 01/10/18 reviewed and showed sodium 142, potassium 4.0, creatinine 0.99 and GFR >60 - saw PCP Alwyn Ren) 01/17/18  3: DM-  - glucose this morning at home was 98 - A1c 01/05/18 was 7.4%  4: COPD- - wearing oxygen at 2L around the clock  - wearing bipap at night - saw pulmonologist Su Monks) 09/20/17  Medication list was reviewed.   Return in 3 months or sooner for any questions/problems before then.

## 2018-01-24 ENCOUNTER — Ambulatory Visit: Payer: Medicare HMO | Attending: Family | Admitting: Family

## 2018-01-24 ENCOUNTER — Encounter: Payer: Self-pay | Admitting: Family

## 2018-01-24 VITALS — BP 121/70 | HR 79 | Resp 18 | Ht 64.0 in | Wt 230.0 lb

## 2018-01-24 DIAGNOSIS — Z79899 Other long term (current) drug therapy: Secondary | ICD-10-CM | POA: Insufficient documentation

## 2018-01-24 DIAGNOSIS — Z8673 Personal history of transient ischemic attack (TIA), and cerebral infarction without residual deficits: Secondary | ICD-10-CM | POA: Diagnosis not present

## 2018-01-24 DIAGNOSIS — I110 Hypertensive heart disease with heart failure: Secondary | ICD-10-CM | POA: Insufficient documentation

## 2018-01-24 DIAGNOSIS — Z825 Family history of asthma and other chronic lower respiratory diseases: Secondary | ICD-10-CM | POA: Insufficient documentation

## 2018-01-24 DIAGNOSIS — Z8249 Family history of ischemic heart disease and other diseases of the circulatory system: Secondary | ICD-10-CM | POA: Diagnosis not present

## 2018-01-24 DIAGNOSIS — Z883 Allergy status to other anti-infective agents status: Secondary | ICD-10-CM | POA: Diagnosis not present

## 2018-01-24 DIAGNOSIS — I509 Heart failure, unspecified: Secondary | ICD-10-CM | POA: Diagnosis present

## 2018-01-24 DIAGNOSIS — Z9981 Dependence on supplemental oxygen: Secondary | ICD-10-CM | POA: Diagnosis not present

## 2018-01-24 DIAGNOSIS — Z87891 Personal history of nicotine dependence: Secondary | ICD-10-CM | POA: Insufficient documentation

## 2018-01-24 DIAGNOSIS — Z794 Long term (current) use of insulin: Secondary | ICD-10-CM | POA: Diagnosis not present

## 2018-01-24 DIAGNOSIS — E119 Type 2 diabetes mellitus without complications: Secondary | ICD-10-CM

## 2018-01-24 DIAGNOSIS — Z882 Allergy status to sulfonamides status: Secondary | ICD-10-CM | POA: Insufficient documentation

## 2018-01-24 DIAGNOSIS — I5022 Chronic systolic (congestive) heart failure: Secondary | ICD-10-CM

## 2018-01-24 DIAGNOSIS — J449 Chronic obstructive pulmonary disease, unspecified: Secondary | ICD-10-CM

## 2018-01-24 DIAGNOSIS — R569 Unspecified convulsions: Secondary | ICD-10-CM | POA: Insufficient documentation

## 2018-01-24 DIAGNOSIS — Z833 Family history of diabetes mellitus: Secondary | ICD-10-CM | POA: Insufficient documentation

## 2018-01-24 DIAGNOSIS — Z7982 Long term (current) use of aspirin: Secondary | ICD-10-CM | POA: Diagnosis not present

## 2018-01-24 DIAGNOSIS — I10 Essential (primary) hypertension: Secondary | ICD-10-CM

## 2018-01-24 NOTE — Patient Instructions (Signed)
Continue weighing daily and call for an overnight weight gain of > 2 pounds or a weekly weight gain of >5 pounds. 

## 2018-01-25 ENCOUNTER — Telehealth: Payer: Self-pay | Admitting: Family

## 2018-01-25 ENCOUNTER — Other Ambulatory Visit: Payer: Self-pay | Admitting: Family

## 2018-01-25 MED ORDER — TORSEMIDE 20 MG PO TABS: 80.0000 mg | ORAL_TABLET | Freq: Two times a day (BID) | ORAL | 3 refills | Status: DC

## 2018-01-25 NOTE — Telephone Encounter (Signed)
Sarah from [facility] called to discuss patient's diuretic use. She says that patient has been on furosemide for many years and she continues to have swelling in her legs.   Most recent renal function on 01/10/18 showed potassium 4.0, creatinine 0.99 and GFR >99.  Gaylord Shih to have patient stop taking the furosemide and will begin torsemide 80mg  daily. Will plan on checking BMP in a couple of weeks.

## 2018-01-26 ENCOUNTER — Encounter: Payer: Self-pay | Admitting: Nurse Practitioner

## 2018-01-26 ENCOUNTER — Ambulatory Visit (INDEPENDENT_AMBULATORY_CARE_PROVIDER_SITE_OTHER): Payer: Medicare HMO | Admitting: Nurse Practitioner

## 2018-01-26 VITALS — BP 140/80 | HR 80 | Ht 64.0 in | Wt 229.0 lb

## 2018-01-26 DIAGNOSIS — I428 Other cardiomyopathies: Secondary | ICD-10-CM | POA: Diagnosis not present

## 2018-01-26 DIAGNOSIS — I10 Essential (primary) hypertension: Secondary | ICD-10-CM

## 2018-01-26 DIAGNOSIS — I5022 Chronic systolic (congestive) heart failure: Secondary | ICD-10-CM

## 2018-01-26 DIAGNOSIS — I5042 Chronic combined systolic (congestive) and diastolic (congestive) heart failure: Secondary | ICD-10-CM | POA: Diagnosis not present

## 2018-01-26 MED ORDER — TORSEMIDE 20 MG PO TABS: 40.0000 mg | ORAL_TABLET | Freq: Every day | ORAL | 0 refills | Status: DC

## 2018-01-26 NOTE — Patient Instructions (Signed)
Medication Instructions:  1- torsemide: Take 2 tablets (40 mg total) by mouth ONCE daily. Do not take it as written on bottle.   If you need a refill on your cardiac medications before your next appointment, please call your pharmacy.   Lab work: 1-  Lab work today Designer, jewellery) 2- Your physician recommends that you return for lab work in: 1 week. Please report to medical mall and check in at registration.  If you have labs (blood work) drawn today and your tests are completely normal, you will receive your results only by: [name] MyChart Message (if you have MyChart) OR . A paper copy in the mail If you have any lab test that is abnormal or we need to change your treatment, we will call you to review the results.  Testing/Procedures: None ordered.   Follow-Up: At [facility], you and your health needs are our priority.  As part of our continuing mission to provide you with exceptional heart care, we have created designated Provider Care Teams.  These Care Teams include your primary Cardiologist (physician) and Advanced Practice Providers (APPs -  Physician Assistants and Nurse Practitioners) who all work together to provide you with the care you need, when you need it. You will need a follow up appointment in 2-3 weeks.  You may see Julien Nordmann, MD or one of the following Advanced Practice Providers on your designated Care Team:   Nicolasa Ducking, NP Eula Listen, PA-C . Marisue Ivan, PA-C

## 2018-01-26 NOTE — Progress Notes (Addendum)
Office Visit    Patient Name: Brenda Wu Date of Encounter: 01/26/2018  Primary Care Provider:  Cheron Schaumann., MD Primary Cardiologist:  Julien Nordmann, MD  Chief Complaint    62 year old female with a history of chronic combined systolic and diastolic ingestive heart failure, COPD, depression, diabetes, hypertension, hyperlipidemia, morbid obesity, and stroke, who presents for follow-up related to heart failure.  Past Medical History    Past Medical History:  Diagnosis Date  . Chronic combined systolic (congestive) and diastolic (congestive) heart failure (HCC)    a. 10/2017 Echo: EF 45-50%, diff HK. Mild to mod MR, mildly dil LA. Mod dil RV w/ mildly reduced RV fxn.  [name] COPD (chronic obstructive pulmonary disease) (HCC)   . Depression   . Diabetes mellitus without complication (HCC)   . Hypercholesteremia   . Hypertension   . Morbid obesity (HCC)   . NICM (nonischemic cardiomyopathy) (HCC)   . NSTEMI (non-ST elevated myocardial infarction) (HCC)    a. 12/2017 -->Cath: nl cors. EF 45-50%.  . Seizures (HCC)   . Stroke (HCC)   . Tricuspid regurgitation    a. 10/2017 Echo: Mild to mod TR.   Past Surgical History:  Procedure Laterality Date  . LEFT HEART CATH AND CORONARY ANGIOGRAPHY N/A 01/09/2018   Procedure: LEFT HEART CATH AND CORONARY ANGIOGRAPHY;  Surgeon: Iran Ouch, MD;  Location: [facility] INVASIVE CV LAB;  Service: Cardiovascular;  Laterality: N/A;  . REPLACEMENT TOTAL KNEE      Allergies  Allergies  Allergen Reactions  . Metformin Other (See Comments)    Other reaction(s): Other (See Comments) Constipation, dry mouth, dizziness  . Bismuth Subsalicylate Rash    Pepto Bismol    History of Present Illness    62 year old female with the above complex past medical history including COPD, depression, diabetes, hypertension, hyperlipidemia, morbid obesity, and stroke.  She has had multiple admissions this fall in the setting of respiratory  failure and COPD.  In mid-November, she was readmitted due to respiratory failure and on further questioning, she reported that she experienced dyspnea and chest pain associated with disorientation.  This prompted her to call her neighbor and then EMS.  CT Angie of the chest was negative for PE and chest x-ray showed mild vascular congestion/early pulmonary edema.  She was admitted to ICU and treated for COPD.  In the setting of respiratory failure she bumped her troponin to 1.57 though trend was flat.  She had previously known mild LV dysfunction with an EF of 45 to 50% by echo in September.  Once respiratory status improved, we proceeded with diagnostic catheterization which showed normal coronary arteries.  Volume status was stable and she was subsequently discharged.  She has since been following heart failure clinic and she reports relatively stable volume status and chronic dyspnea on exertion.  She is wearing oxygen.  She says she has good days and bad days but overall, her activity is limited.  She occasionally notes mild lower extremity edema, especially if she has been sitting for long periods of time.  Yesterday, her home health nurse called the heart failure clinic and noted that she has been having some swelling and she was switched from Lasix 80 mg daily to torsemide 80 mg twice daily (phone note indicates plan was for 80 mg daily however prescription was sent in as 80 mg twice daily).  Patient says she has been having less response to Lasix but has not necessarily noticed more swelling.  She denies  chest pain, palpitations, PND, orthopnea, dizziness, syncope, or early satiety.  She has yet to pick up her torsemide prescription.  Home Medications    Prior to Admission medications   Medication Sig Start Date End Date Taking? Authorizing Provider  albuterol (PROVENTIL HFA;VENTOLIN HFA) 108 (90 Base) MCG/ACT inhaler Inhale 2 puffs into the lungs 4 (four) times daily as needed for wheezing or  shortness of breath.  01/05/16   [provider]  albuterol (PROVENTIL) (2.5 MG/3ML) 0.083% nebulizer solution Inhale 2.5 mg into the lungs every 6 (six) hours as needed for wheezing or shortness of breath.  10/01/16   [provider]  aspirin (GOODSENSE ASPIRIN) 325 MG tablet Take 325 mg by mouth daily. 01/31/08   [provider]  atorvastatin (LIPITOR) 40 MG tablet Take 40 mg by mouth every evening.     [provider]  budesonide-formoterol (SYMBICORT) 160-4.5 MCG/ACT inhaler Inhale 2 puffs into the lungs 2 (two) times daily. 05/22/15   [provider]  diltiazem (CARDIZEM CD) 180 MG 24 hr capsule Take 180 mg by mouth daily.    [provider]  ferrous sulfate 325 (65 FE) MG tablet Take 325 mg by mouth daily with breakfast.    [provider]  glipiZIDE (GLUCOTROL) 10 MG tablet Take 10 mg by mouth 2 (two) times daily before a meal.     [provider]  guaiFENesin (MUCINEX) 600 MG 12 hr tablet Take 600 mg by mouth 2 (two) times daily as needed.     [provider]  Insulin Glargine (LANTUS SOLOSTAR) 100 UNIT/ML Solostar Pen Inject 15 Units into the skin daily. 01/21/17   [provider]  lamoTRIgine (LAMICTAL) 200 MG tablet Take 200 mg by mouth 2 (two) times daily.     [provider]  losartan (COZAAR) 50 MG tablet Take 50 mg by mouth daily.    [provider]  Multiple Vitamin (MULTI-VITAMINS) TABS Take 1 tablet by mouth daily.    [provider]  niacin 500 MG tablet Take 500 mg by mouth daily.    [provider]  potassium chloride SA (K-DUR,KLOR-CON) 20 MEQ tablet Take 1 tablet (20 mEq total) by mouth daily. 02/12/17   [name], MD  pregabalin (LYRICA) 75 MG capsule Take 75 mg by mouth 2 (two) times daily.    [provider]  senna (SENOKOT) 8.6 MG TABS tablet Take 1 tablet by mouth daily.     [provider]  sitaGLIPtin (JANUVIA) 50 MG  tablet Take 50 mg by mouth daily.    [provider]  tiotropium (SPIRIVA) 18 MCG inhalation capsule Place 18 mcg into inhaler and inhale daily.    [provider]  topiramate (TOPAMAX) 200 MG tablet Take 200 mg by mouth 2 (two) times daily.    [provider]  torsemide (DEMADEX) 20 MG tablet Take 4 tablets (80 mg total) by mouth 2 (two) times daily. 01/25/18 04/25/18  Delma Freeze, FNP  vitamin B-12 (CYANOCOBALAMIN) 500 MCG tablet Take 500 mcg by mouth daily.    [provider]    Review of Systems    Chronic, relatively stable dyspnea on exertion as well as dependent ankle edema.  Her weight has been coming down at home and she is 229 today.  She was in the 270s in September.  She denies chest pain, palpitations, PND, orthopnea, dizziness, syncope, or early satiety.  All other systems reviewed and are otherwise negative except as noted above.  Physical Exam    VS:  BP 140/80 (BP Location: Left Arm, Patient Position: Sitting, Cuff Size: Normal)   Pulse 80   Ht 5\' 4"  (1.626 m)   Wt 229 lb (103.9 kg)   SpO2 95%   BMI 39.31 kg/m  , BMI Body mass index is 39.31 kg/m. GEN: Obese, in no acute distress. HEENT: normal. Neck: Supple, obese, difficult to gauge JVP, no  carotid bruits, or masses. Cardiac: RRR, no murmurs, rubs, or gallops. No clubbing, cyanosis, 1+ bilateral ankle edema.  Radials/DP/PT 2+ and equal bilaterally.  Respiratory:  Respirations regular and unlabored, clear to auscultation bilaterally. GI: Soft, nontender, nondistended, BS + x 4. MS: no deformity or atrophy. Skin: warm and dry, no rash. Neuro:  Strength and sensation are intact. Psych: Normal affect.  Accessory Clinical Findings    ECG personally reviewed by me today -regular sinus rhythm, 80, left atrial enlargement - no acute changes.  Assessment & Plan    1.  Chronic mild systolic diastolic congestive heart failure/NICM: EF 45 to 50% by echo in September and again by left  ventriculogram in November.  Weight has been coming down ever since September, at which time she was in the 270s, and now 229 pounds.  She has chronic dyspnea on exertion in the setting of COPD and wears oxygen.  This is been relatively stable.  She has mild ankle edema, which she says is predominantly dependent in nature and not present all day.  Yesterday, her home health nurse called the heart failure clinic as there was a sense that she was not responding well to oral Lasix anymore.  She was switched to torsemide 80 mg twice daily.  Patient was previously on Lasix 80 daily.  Given significant weight loss over the past few months on diuretic therapy, I will follow-up a basic metabolic panel today.  For the time being, I have asked that when she picks up her torsemide, she take only 40 mg daily.  She otherwise remains on ARB therapy.  She is not on a beta-blocker in the setting of multiple COPD admissions.  2.  Recent non-STEMI: This was likely demand ischemia in the setting of respiratory failure as she had normal coronary arteries.  She remains on aspirin (high dose in the setting of prior stroke) and statin therapy.  3.  Essential hypertension: Blood pressure elevated today at 140/80.  She has not taken her diuretic today and I will not make any changes at this point.  She will be switching over to torsemide 40 mg daily from Lasix.  4.  Hyperlipidemia: On statin.  5.  Type 2 diabetes mellitus: Followed by primary care and on insulin.  A1c was 7.4 in July.  6.  Disposition: Follow-up with Dr. Mariah Milling in 2 weeks. F/u bmet today and in 1 wk w/ change to diuretic.  Nicolasa Ducking, NP 01/26/2018, 5:34 PM

## 2018-01-27 ENCOUNTER — Telehealth: Payer: Self-pay

## 2018-01-27 LAB — BASIC METABOLIC PANEL
BUN / CREAT RATIO: 21 (ref 12–28)
BUN: 21 mg/dL (ref 8–27)
CO2: 26 mmol/L (ref 20–29)
Calcium: 9.1 mg/dL (ref 8.7–10.3)
Chloride: 103 mmol/L (ref 96–106)
Creatinine, Ser: 0.99 mg/dL (ref 0.57–1.00)
GFR calc non Af Amer: 62 mL/min/{1.73_m2} (ref >59)
GFR, EST AFRICAN AMERICAN: 71 mL/min/{1.73_m2} (ref >59)
GLUCOSE: 264 mg/dL — AB (ref 65–99)
Potassium: 3.9 mmol/L (ref 3.5–5.2)
SODIUM: 145 mmol/L — AB (ref 134–144)

## 2018-01-27 NOTE — Telephone Encounter (Signed)
Telephone call made to patient based on result notes from Ward Givens, NP,   "Notes recorded by Creig Hines, NP on 01/27/2018 at 7:42 AM EST Kidney fxn and lytes ok. Ok to start taking torsemide 40mg  daily as we discussed. As this is a change in diuretics for her, she should contact us asap for any wt gain (2 lbs in 24 hrs, 5 lbs over course of a week), and also f/u bmet next week as already planned."  Pt reports that she has a scale at home and is checking daily weights. Pt agreeable to POC and is going to medical mall next week for blood redraw.   Advised pt to call for any further questions or concerns

## 2018-02-02 ENCOUNTER — Other Ambulatory Visit
Admission: RE | Admit: 2018-02-02 | Discharge: 2018-02-02 | Disposition: A | Discharge status: Home / Self Care | Payer: Medicare HMO | Attending: Nurse Practitioner | Admitting: Nurse Practitioner

## 2018-02-02 DIAGNOSIS — I5042 Chronic combined systolic (congestive) and diastolic (congestive) heart failure: Secondary | ICD-10-CM | POA: Diagnosis present

## 2018-02-02 LAB — BASIC METABOLIC PANEL
ANION GAP: 8 (ref 5–15)
BUN: 29 mg/dL — AB (ref 8–23)
CHLORIDE: 102 mmol/L (ref 98–111)
CO2: 31 mmol/L (ref 22–32)
CREATININE: 1.1 mg/dL — AB (ref 0.44–1.00)
Calcium: 9.3 mg/dL (ref 8.9–10.3)
GFR calc Af Amer: >60 mL/min (ref >60)
GFR calc non Af Amer: 54 mL/min — ABNORMAL LOW (ref >60)
Glucose, Bld: 106 mg/dL — ABNORMAL HIGH (ref 70–99)
POTASSIUM: 3.6 mmol/L (ref 3.5–5.1)
SODIUM: 141 mmol/L (ref 135–145)

## 2018-02-06 ENCOUNTER — Telehealth: Payer: Self-pay

## 2018-02-06 NOTE — Telephone Encounter (Signed)
Call to patient to relay information from provider,   "Notes recorded by Creig Hines, NP on 02/03/2018 at 7:57 AM EST Kidney fxn relatively stable. K low end of normal. Cont torsemide 40 daily and kdur 20 daily. Has wt/edema been stable?"   Pt reports that weight and swelling has remained stable and no issues of SOB at this time.    She is agreeable to continue normal POC>    Advised pt to call for any further questions or concerns

## 2018-02-07 ENCOUNTER — Ambulatory Visit: Payer: Medicare HMO | Admitting: Family

## 2018-02-12 NOTE — Progress Notes (Signed)
Cardiology Office Note  Date:  02/13/2018   ID:  Brenda Wu, DOB Nov 14, 1955, MRN [id number]  PCP:  Cheron SchaumannVelazquez, Brenda Y., MD   Chief Complaint  Patient presents with  . other    2 week f/u. bilateral arm pain left at night.Medications reviewed verbally.     HPI:  62 year old female with  COPD,  depression,  Diabetes,  hypertension,  Hyperlipidemia, morbid obesity,   stroke.   Who presents for follow-up of her shortness of breath, diastolic and systolic CHF  multiple admissions for respiratory failure and COPD.    In mid-November,  respiratory failure  CT chest was negative for PE  chest x-ray showed mild vascular congestion/early pulmonary edema.    treated for COPD.   troponin to 1.57 though trend was flat.   EF of 45 to 50% by echo in September.     diagnostic catheterization which showed normal coronary arteries.     followed in the heart failure clinic  activity is limited secondary to shortness of breat   mild lower extremity edema, especially if she has been sitting for long periods of time.    switched from Lasix 80 mg daily to torsemide  40 daily Feels that she is doing well, euvolemic  Shoulder pain, had surgery on one shoulder, wonders if she could have similar problem on other shoulder  EKG personally reviewed by myself on todays visit Shows normal sinus rhythm rate 85 bpm nonspecific ST abnormality precordial leads  PMH:   has a past medical history of Chronic combined systolic (congestive) and diastolic (congestive) heart failure (HCC), COPD (chronic obstructive pulmonary disease) (HCC), Depression, Diabetes mellitus without complication (HCC), Hypercholesteremia, Hypertension, Morbid obesity (HCC), NICM (nonischemic cardiomyopathy) (HCC), NSTEMI (non-ST elevated myocardial infarction) (HCC), Seizures (HCC), Stroke (HCC), and Tricuspid regurgitation.  PSH:    Past Surgical History:  Procedure Laterality Date  . LEFT HEART CATH AND CORONARY  ANGIOGRAPHY N/A 01/09/2018   Procedure: LEFT HEART CATH AND CORONARY ANGIOGRAPHY;  Surgeon: Iran OuchArida, Brenda A, MD;  Location: [facility] INVASIVE CV LAB;  Service: Cardiovascular;  Laterality: N/A;  . REPLACEMENT TOTAL KNEE      Current Outpatient Medications  Medication Sig Dispense Refill  . albuterol (PROVENTIL HFA;VENTOLIN HFA) 108 (90 Base) MCG/ACT inhaler Inhale 2 puffs into the lungs 4 (four) times daily as needed for wheezing or shortness of breath.     [name]. albuterol (PROVENTIL) (2.5 MG/3ML) 0.083% nebulizer solution Inhale 2.5 mg into the lungs every 6 (six) hours as needed for wheezing or shortness of breath.     [name]. aspirin (GOODSENSE ASPIRIN) 325 MG tablet Take 325 mg by mouth daily.    [name]. atorvastatin (LIPITOR) 40 MG tablet Take 40 mg by mouth every evening.     . budesonide-formoterol (SYMBICORT) 160-4.5 MCG/ACT inhaler Inhale 2 puffs into the lungs 2 (two) times daily.    [name]. diltiazem (CARDIZEM CD) 180 MG 24 hr capsule Take 180 mg by mouth daily.    . ferrous sulfate 325 (65 FE) MG tablet Take 325 mg by mouth daily with breakfast.    . glipiZIDE (GLUCOTROL) 10 MG tablet Take 10 mg by mouth 2 (two) times daily before a meal.     . guaiFENesin (MUCINEX) 600 MG 12 hr tablet Take 600 mg by mouth 2 (two) times daily as needed.     . Insulin Glargine (LANTUS SOLOSTAR) 100 UNIT/ML Solostar Pen Inject 20-25 Units into the skin daily.     [name]. lamoTRIgine (LAMICTAL) 200 MG tablet Take 200  mg by mouth 2 (two) times daily.     [name] losartan (COZAAR) 50 MG tablet Take 50 mg by mouth daily.    . Multiple Vitamin (MULTI-VITAMINS) TABS Take 1 tablet by mouth daily.    . niacin 500 MG tablet Take 500 mg by mouth daily.    . potassium chloride SA (K-DUR,KLOR-CON) 20 MEQ tablet Take 1 tablet (20 mEq total) by mouth daily. 30 tablet 2  . pregabalin (LYRICA) 75 MG capsule Take 75 mg by mouth 2 (two) times daily.    [name] senna (SENOKOT) 8.6 MG TABS tablet Take 1 tablet by mouth daily.     . sitaGLIPtin (JANUVIA) 50 MG  tablet Take 50 mg by mouth daily.    [name] tiotropium (SPIRIVA) 18 MCG inhalation capsule Place 18 mcg into inhaler and inhale daily.    [name] topiramate (TOPAMAX) 200 MG tablet Take 200 mg by mouth 2 (two) times daily.    [name] torsemide (DEMADEX) 20 MG tablet Take 2 tablets (40 mg total) by mouth daily. 90 tablet 0  . vitamin B-12 (CYANOCOBALAMIN) 500 MCG tablet Take 500 mcg by mouth daily.     No current facility-administered medications for this visit.      Allergies:   Metformin and Bismuth subsalicylate   Social History:  The patient  reports that she has quit smoking. Her smoking use included cigarettes. She has a 30.00 pack-year smoking history. She has never used smokeless tobacco. She reports that she does not drink alcohol or use drugs.   Family History:   family history includes Asthma in her sister; Diabetes in her sister; Heart murmur in her sister; Hypertension in her father.    Review of Systems: Review of Systems  Constitutional: Negative.   Respiratory: Positive for shortness of breath.   Cardiovascular: Negative.   Gastrointestinal: Negative.   Musculoskeletal: Negative.   Neurological: Negative.   Psychiatric/Behavioral: Negative.   All other systems reviewed and are negative.    PHYSICAL EXAM: VS:  BP 128/78 (BP Location: Left Arm, Patient Position: Sitting, Cuff Size: Normal)   Pulse 85   Ht 5\' 4"  (1.626 m)   Wt 212 lb (96.2 kg)   BMI 36.39 kg/m  , BMI Body mass index is 36.39 kg/m. GEN: Well nourished, well developed, in no acute distress , obese, on is a cannula oxygen HEENT: normal  Neck: no JVD, carotid bruits, or masses Cardiac: RRR; no murmurs, rubs, or gallops,no edema  Respiratory:  clear to auscultation bilaterally, normal work of breathing GI: soft, nontender, nondistended, + BS MS: no deformity or atrophy  Skin: warm and dry, no rash Neuro:  Strength and sensation are intact Psych: euthymic mood, full affect   Recent Labs: 01/06/2018: ALT 18; B  Natriuretic Peptide 816.0; Magnesium 2.0; TSH 0.271 01/10/2018: Hemoglobin 15.4; Platelets 96 02/02/2018: BUN 29; Creatinine, Ser 1.10; Potassium 3.6; Sodium 141    Lipid Panel No results found for: CHOL, HDL, LDLCALC, TRIG    Wt Readings from Last 3 Encounters:  02/13/18 212 lb (96.2 kg)  01/26/18 229 lb (103.9 kg)  01/24/18 230 lb (104.3 kg)       ASSESSMENT AND PLAN:  Chronic combined systolic and diastolic heart failure (HCC) - Plan: EKG 12-Lead Appears euvolemic on today's visit On losartan, torsemide,  Monitors her weight closely at home  NICM (nonischemic cardiomyopathy) (HCC) - Plan: EKG 12-Lead Recommended she decrease aspirin down to 81 mg daily, for years has been taking 325 mg for unclear reasons  Essential hypertension  No medication changes made, blood pressure stable  Type 2 diabetes mellitus without complication, with long-term current use of insulin (HCC)  COPD with asthma (HCC) Prior history of COPD exacerbation Avoid sick contacts, smoking cessation  Hypertension associated with diabetes (HCC) Diltiazem, losartan and torsemide  Paroxysmal atrial fibrillation (HCC) Notes indicate history of paroxysmal atrial fibrillation Details unclear, she is unaware  Disposition:   F/U  12 months   Total encounter time more than 25 minutes  Greater than 50% was spent in counseling and coordination of care with the patient    Orders Placed This Encounter  Procedures  . EKG 12-Lead     Signed, Dossie Arbour, M.D., Ph.D. 02/13/2018  [facility] [place], [place] [phone]

## 2018-02-13 ENCOUNTER — Other Ambulatory Visit: Payer: Self-pay | Admitting: *Deleted

## 2018-02-13 ENCOUNTER — Encounter: Payer: Self-pay | Admitting: Cardiovascular Disease

## 2018-02-13 ENCOUNTER — Ambulatory Visit: Payer: Medicare HMO | Admitting: Cardiovascular Disease

## 2018-02-13 VITALS — BP 128/78 | HR 85 | Ht 64.0 in | Wt 212.0 lb

## 2018-02-13 DIAGNOSIS — I428 Other cardiomyopathies: Secondary | ICD-10-CM

## 2018-02-13 DIAGNOSIS — I5042 Chronic combined systolic (congestive) and diastolic (congestive) heart failure: Secondary | ICD-10-CM | POA: Diagnosis not present

## 2018-02-13 DIAGNOSIS — I10 Essential (primary) hypertension: Secondary | ICD-10-CM

## 2018-02-13 DIAGNOSIS — I152 Hypertension secondary to endocrine disorders: Secondary | ICD-10-CM

## 2018-02-13 DIAGNOSIS — J449 Chronic obstructive pulmonary disease, unspecified: Secondary | ICD-10-CM

## 2018-02-13 DIAGNOSIS — E119 Type 2 diabetes mellitus without complications: Secondary | ICD-10-CM | POA: Diagnosis not present

## 2018-02-13 DIAGNOSIS — Z794 Long term (current) use of insulin: Secondary | ICD-10-CM

## 2018-02-13 DIAGNOSIS — E1159 Type 2 diabetes mellitus with other circulatory complications: Secondary | ICD-10-CM

## 2018-02-13 DIAGNOSIS — I480 Paroxysmal atrial fibrillation: Secondary | ICD-10-CM

## 2018-02-13 DIAGNOSIS — I639 Cerebral infarction, unspecified: Secondary | ICD-10-CM

## 2018-02-13 MED ORDER — ASPIRIN EC 81 MG PO TBEC: 81.0000 mg | DELAYED_RELEASE_TABLET | Freq: Every day | ORAL | 0 refills | Status: AC

## 2018-02-13 NOTE — Patient Instructions (Addendum)
Phone number to Dr. Yves Dill, for shoulder- [phone]  We will place a referral to cardiac rehab- they will contact you directly to schedule an orientation  Medication Instructions:   - Decrease aspirin to 81 mg enteric coated- take 1 tablet by mouth once daily  If you need a refill on your cardiac medications before your next appointment, please call your pharmacy.    Lab work: No new labs needed   If you have labs (blood work) drawn today and your tests are completely normal, you will receive your results only by: [name] MyChart Message (if you have MyChart) OR . A paper copy in the mail If you have any lab test that is abnormal or we need to change your treatment, we will call you to review the results.   Testing/Procedures: No new testing needed   Follow-Up: At [facility], you and your health needs are our priority.  As part of our continuing mission to provide you with exceptional heart care, we have created designated Provider Care Teams.  These Care Teams include your primary Cardiologist (physician) and Advanced Practice Providers (APPs -  Physician Assistants and Nurse Practitioners) who all work together to provide you with the care you need, when you need it.  . You will need a follow up appointment in 12 months .   Please call our office 2 months in advance to schedule this appointment.    . Providers on your designated Care Team:   . Nicolasa Ducking, NP . Eula Listen, PA-C . Marisue Ivan, PA-C  Any Other Special Instructions Will Be Listed Below (If Applicable).  For educational health videos Log in to : [email or web address] Or : [email or web address], password : [email or web address]

## 2018-03-14 ENCOUNTER — Encounter: Payer: Medicare Other | Attending: Cardiovascular Disease

## 2018-03-14 ENCOUNTER — Other Ambulatory Visit: Payer: Self-pay

## 2018-03-14 VITALS — Ht 63.25 in | Wt 206.8 lb

## 2018-03-14 DIAGNOSIS — Z794 Long term (current) use of insulin: Secondary | ICD-10-CM | POA: Insufficient documentation

## 2018-03-14 DIAGNOSIS — Z87891 Personal history of nicotine dependence: Secondary | ICD-10-CM | POA: Diagnosis not present

## 2018-03-14 DIAGNOSIS — Z8673 Personal history of transient ischemic attack (TIA), and cerebral infarction without residual deficits: Secondary | ICD-10-CM | POA: Insufficient documentation

## 2018-03-14 DIAGNOSIS — I110 Hypertensive heart disease with heart failure: Secondary | ICD-10-CM | POA: Diagnosis not present

## 2018-03-14 DIAGNOSIS — I071 Rheumatic tricuspid insufficiency: Secondary | ICD-10-CM | POA: Diagnosis not present

## 2018-03-14 DIAGNOSIS — I5042 Chronic combined systolic (congestive) and diastolic (congestive) heart failure: Secondary | ICD-10-CM

## 2018-03-14 DIAGNOSIS — E7800 Pure hypercholesterolemia, unspecified: Secondary | ICD-10-CM | POA: Diagnosis not present

## 2018-03-14 DIAGNOSIS — I252 Old myocardial infarction: Secondary | ICD-10-CM | POA: Insufficient documentation

## 2018-03-14 DIAGNOSIS — I428 Other cardiomyopathies: Secondary | ICD-10-CM | POA: Diagnosis not present

## 2018-03-14 DIAGNOSIS — E119 Type 2 diabetes mellitus without complications: Secondary | ICD-10-CM | POA: Diagnosis not present

## 2018-03-14 DIAGNOSIS — Z79899 Other long term (current) drug therapy: Secondary | ICD-10-CM | POA: Diagnosis not present

## 2018-03-14 DIAGNOSIS — Z7982 Long term (current) use of aspirin: Secondary | ICD-10-CM | POA: Insufficient documentation

## 2018-03-14 DIAGNOSIS — J449 Chronic obstructive pulmonary disease, unspecified: Secondary | ICD-10-CM | POA: Diagnosis not present

## 2018-03-14 NOTE — Progress Notes (Signed)
Daily Session Note  Patient Details  Name: Brenda Wu MRN: [id number] Date of Birth: 1955/12/27 Referring Provider:     Pulmonary Rehab from 03/14/2018 in [facility]  Referring Provider  [place]      Encounter Date: 03/14/2018  Check In: Session Check In - 03/14/18 [place]      Check-In   Supervising physician immediately available to respond to emergencies  [facility] immediately available ER MD    Physician(s)  Dr. Quentin Cornwall and Mariea Clonts    Location  [facility]    Staff Present  Justin Mend RCP,RRT,BSRT;Amanda Oletta Darter, [place], ACSM CEP, Exercise Physiologist    Medication changes reported      No    Fall or balance concerns reported     No    Warm-up and Cool-down  Not performed (comment)   medical evaluation   Resistance Training Performed  No    VAD Patient?  No      Pain Assessment   Currently in Pain?  No/denies          Social History   Tobacco Use  Smoking Status Former Smoker  . Packs/day: 1.00  . Years: 30.00  . Pack years: 30.00  . Types: Cigarettes  . Last attempt to quit: 02/22/2002  . Years since quitting: 16.0  Smokeless Tobacco Never Used    Goals Met:  Exercise tolerated well Personal goals reviewed Queuing for purse lip breathing No report of cardiac concerns or symptoms Strength training completed today  Goals Unmet:  Not Applicable  Comments:  6 Minute Walk    Row Name 03/14/18 1326         6 Minute Walk   Phase  Initial     Distance  765 feet     Walk Time  5.25 minutes     # of Rest Breaks  1     MPH  1.65     METS  2.2     RPE  17     Perceived Dyspnea   2.5     VO2 Peak  7.7     Symptoms  No     Resting HR  84 bpm     Resting BP  94/60     Resting Oxygen Saturation   96 %     Exercise Oxygen Saturation  during 6 min walk  83 %     Max Ex. HR  139 bpm     Max Ex. BP  120/64     2 Minute Post BP  116/58       Interval HR   1 Minute HR  84     3 Minute HR  116     4 Minute HR  131      5 Minute HR  129     6 Minute HR  139     2 Minute Post HR  96     Interval Heart Rate?  Yes       Interval Oxygen   Interval Oxygen?  Yes     Baseline Oxygen Saturation %  96 %     1 Minute Oxygen Saturation %  91 %     1 Minute Liters of Oxygen  2 L     2 Minute Liters of Oxygen  2 L     3 Minute Oxygen Saturation %  90 %     3 Minute Liters of Oxygen  2 L     4 Minute  Oxygen Saturation %  87 %     4 Minute Liters of Oxygen  2 L     5 Minute Oxygen Saturation %  85 %     5 Minute Liters of Oxygen  2 L     6 Minute Oxygen Saturation %  83 %     6 Minute Liters of Oxygen  2 L     2 Minute Post Oxygen Saturation %  93 %     2 Minute Post Liters of Oxygen  2 L      Service Time 1130-1240   Dr. Emily Filbert is Medical Director for [place] and [facility].

## 2018-03-14 NOTE — Progress Notes (Signed)
Pulmonary Individual Treatment Plan  Patient Details   Name: Brenda Wu MRN: [id number] Date of Birth: 01/31/1956 Referring Provider:     Pulmonary Rehab from 03/14/2018 in [facility] Cardiac and Pulmonary Rehab  Referring Provider  Gollan      Initial Encounter Date:    Pulmonary Rehab from 03/14/2018 in [facility] Cardiac and Pulmonary Rehab  Date  03/14/18      Visit Diagnosis: Heart failure, systolic and diastolic, chronic ([place])  Patient's Home Medications on Admission:  Current Outpatient Medications:  .  albuterol (PROVENTIL HFA;VENTOLIN HFA) 108 (90 Base) MCG/ACT inhaler, Inhale 2 puffs into the lungs 4 (four) times daily as needed for wheezing or shortness of breath. , Disp: , Rfl:  .  albuterol (PROVENTIL) (2.5 MG/3ML) 0.083% nebulizer solution, Inhale 2.5 mg into the lungs every 6 (six) hours as needed for wheezing or shortness of breath. , Disp: , Rfl:  .  aspirin EC 81 MG tablet, Take 1 tablet (81 mg total) by mouth daily., Disp: 32 tablet, Rfl: 0 .  atorvastatin (LIPITOR) 40 MG tablet, Take 40 mg by mouth every evening. , Disp: , Rfl:  .  budesonide-formoterol (SYMBICORT) 160-4.5 MCG/ACT inhaler, Inhale 2 puffs into the lungs 2 (two) times daily., Disp: , Rfl:  .  diltiazem (CARDIZEM CD) 180 MG 24 hr capsule, Take 180 mg by mouth daily., Disp: , Rfl:  .  ferrous sulfate 325 (65 FE) MG tablet, Take 325 mg by mouth daily with breakfast., Disp: , Rfl:  .  glipiZIDE (GLUCOTROL) 10 MG tablet, Take 10 mg by mouth 2 (two) times daily before a meal. , Disp: , Rfl:  .  guaiFENesin (MUCINEX) 600 MG 12 hr tablet, Take 600 mg by mouth 2 (two) times daily as needed. , Disp: , Rfl:  .  Insulin Glargine (LANTUS SOLOSTAR) 100 UNIT/ML Solostar Pen, Inject 20-25 Units into the skin daily. , Disp: , Rfl:  .  lamoTRIgine (LAMICTAL) 200 MG tablet, Take 200 mg by mouth 2 (two) times daily. , Disp: , Rfl:  .  losartan (COZAAR) 50 MG tablet, Take 50 mg by mouth daily., Disp: , Rfl:  .  Multiple Vitamin  (MULTI-VITAMINS) TABS, Take 1 tablet by mouth daily., Disp: , Rfl:  .  niacin 500 MG tablet, Take 500 mg by mouth daily., Disp: , Rfl:  .  potassium chloride SA (K-DUR,KLOR-CON) 20 MEQ tablet, Take 1 tablet (20 mEq total) by mouth daily., Disp: 30 tablet, Rfl: 2 .  pregabalin (LYRICA) 75 MG capsule, Take 75 mg by mouth 2 (two) times daily., Disp: , Rfl:  .  senna (SENOKOT) 8.6 MG TABS tablet, Take 1 tablet by mouth daily. , Disp: , Rfl:  .  sitaGLIPtin (JANUVIA) 50 MG tablet, Take 50 mg by mouth daily., Disp: , Rfl:  .  tiotropium (SPIRIVA) 18 MCG inhalation capsule, Place 18 mcg into inhaler and inhale daily., Disp: , Rfl:  .  topiramate (TOPAMAX) 200 MG tablet, Take 200 mg by mouth 2 (two) times daily., Disp: , Rfl:  .  torsemide (DEMADEX) 20 MG tablet, Take 2 tablets (40 mg total) by mouth daily., Disp: 90 tablet, Rfl: 0 .  vitamin B-12 (CYANOCOBALAMIN) 500 MCG tablet, Take 500 mcg by mouth daily., Disp: , Rfl:   Past Medical History: Past Medical History:  Diagnosis Date  . Chronic combined systolic (congestive) and diastolic (congestive) heart failure ([place])    a. 10/2017 Echo: EF 45-50%, diff HK. Mild to mod MR, mildly dil LA. Mod dil RV w/  mildly reduced RV fxn.  [name] COPD (chronic obstructive pulmonary disease) ([place])   . Depression   . Diabetes mellitus without complication ([place])   . Hypercholesteremia   . Hypertension   . Morbid obesity ([place])   . NICM (nonischemic cardiomyopathy) ([place])   . NSTEMI (non-ST elevated myocardial infarction) ([place])    a. 12/2017 -->Cath: nl cors. EF 45-50%.  . Seizures ([place])   . Stroke ([place])   . Tricuspid regurgitation    a. 10/2017 Echo: Mild to mod TR.    Tobacco Use: Social History   Tobacco Use  Smoking Status Former Smoker  . Packs/day: 1.00  . Years: 30.00  . Pack years: 30.00  . Types: Cigarettes  . Last attempt to quit: 02/22/2002  . Years since quitting: 16.0  Smokeless Tobacco Never Used    Labs: Recent Chemical engineer    Labs  for ITP Cardiac and Pulmonary Rehab Latest Ref Rng & Units 11/23/2017 01/05/2018 01/05/2018 01/05/2018 01/06/2018   Hemoglobin A1c 4.8 - 5.6 % 8.2(H) - - 7.4(H) -   PHART 7.350 - 7.450 - - 7.24(L) - 7.32(L)   PCO2ART 32.0 - 48.0 mmHg - - 84(HH) - 65(H)   HCO3 20.0 - 28.0 mmol/L 39.0(H) 34.4(H) 35.2(H) - 33.5(H)   O2SAT % - 99.2 90.2 - 81.6       Pulmonary Assessment Scores: Pulmonary Assessment Scores    Row Name 03/14/18 1222         ADL UCSD   ADL Phase  Entry     SOB Score total  54     Rest  0     Walk  3     Stairs  5     Bath  2     Dress  1     Shop  4       CAT Score   CAT Score  12       mMRC Score   mMRC Score  3        Pulmonary Function Assessment: Pulmonary Function Assessment - 03/14/18 1150      Breath   Bilateral Breath Sounds  Clear    Shortness of Breath  Yes;Fear of Shortness of Breath;Limiting activity;Panic with Shortness of Breath       Exercise Target Goals: Exercise Program Goal: Individual exercise prescription set using results from initial 6 min walk test and THRR while considering  patient's activity barriers and safety.   Exercise Prescription Goal: Initial exercise prescription builds to 30-45 minutes a day of aerobic activity, 2-3 days per week.  Home exercise guidelines will be given to patient during program as part of exercise prescription that the participant will acknowledge.  Activity Barriers & Risk Stratification:   6 Minute Walk: 6 Minute Walk    Row Name 03/14/18 1326         6 Minute Walk   Phase  Initial     Distance  765 feet     Walk Time  5.25 minutes     # of Rest Breaks  1     MPH  1.65     METS  2.2     RPE  17     Perceived Dyspnea   2.5     VO2 Peak  7.7     Symptoms  No     Resting HR  84 bpm     Resting BP  94/60     Resting Oxygen Saturation   96 %  Exercise Oxygen Saturation  during 6 min walk  83 %     Max Ex. HR  139 bpm     Max Ex. BP  120/64     2 Minute Post BP  116/58        Interval HR   1 Minute HR  84     3 Minute HR  116     4 Minute HR  131     5 Minute HR  129     6 Minute HR  139     2 Minute Post HR  96     Interval Heart Rate?  Yes       Interval Oxygen   Interval Oxygen?  Yes     Baseline Oxygen Saturation %  96 %     1 Minute Oxygen Saturation %  91 %     1 Minute Liters of Oxygen  2 L     2 Minute Liters of Oxygen  2 L     3 Minute Oxygen Saturation %  90 %     3 Minute Liters of Oxygen  2 L     4 Minute Oxygen Saturation %  87 %     4 Minute Liters of Oxygen  2 L     5 Minute Oxygen Saturation %  85 %     5 Minute Liters of Oxygen  2 L     6 Minute Oxygen Saturation %  83 %     6 Minute Liters of Oxygen  2 L     2 Minute Post Oxygen Saturation %  93 %     2 Minute Post Liters of Oxygen  2 L       Oxygen Initial Assessment: Oxygen Initial Assessment - 03/14/18 1149      Home Oxygen   Home Oxygen Device  Home Concentrator;E-Tanks    Sleep Oxygen Prescription  Continuous;BiPAP    Liters per minute  2    Home Exercise Oxygen Prescription  Continuous    Liters per minute  2    Home at Rest Exercise Oxygen Prescription  Continuous    Liters per minute  2    Compliance with Home Oxygen Use  Yes      Initial 6 min Walk   Oxygen Used  Continuous;E-Tanks    Liters per minute  2      Program Oxygen Prescription   Program Oxygen Prescription  Continuous;E-Tanks    Liters per minute  2      Intervention   Short Term Goals  To learn and exhibit compliance with exercise, home and travel O2 prescription;To learn and understand importance of monitoring SPO2 with pulse oximeter and demonstrate accurate use of the pulse oximeter.;To learn and understand importance of maintaining oxygen saturations>88%;To learn and demonstrate proper pursed lip breathing techniques or other breathing techniques.;To learn and demonstrate proper use of respiratory medications    Long  Term Goals  Exhibits compliance with exercise, home and travel O2  prescription;Verbalizes importance of monitoring SPO2 with pulse oximeter and return demonstration;Maintenance of O2 saturations>88%;Exhibits proper breathing techniques, such as pursed lip breathing or other method taught during program session;Compliance with respiratory medication;Demonstrates proper use of MDI's       Oxygen Re-Evaluation:   Oxygen Discharge (Final Oxygen Re-Evaluation):   Initial Exercise Prescription: Initial Exercise Prescription - 03/14/18 1300      Date of Initial Exercise RX and Referring Provider   Date  03/14/18  Referring Provider  Gollan      Treadmill   MPH  1.5    Grade  0    Minutes  15    METs  2.15      NuStep   Level  2    SPM  80    Minutes  15    METs  2      Biostep-RELP   Level  2    SPM  50    Minutes  15    METs  2      Prescription Details   Frequency (times per week)  3    Duration  Progress to 45 minutes of aerobic exercise without signs/symptoms of physical distress      Intensity   THRR 40-80% of Max Heartrate  114-144    Ratings of Perceived Exertion  11-15    Perceived Dyspnea  0-4      Resistance Training   Training Prescription  Yes    Weight  3 lb    Reps  10-15       Perform Capillary Blood Glucose checks as needed.  Exercise Prescription Changes:   Exercise Comments:   Exercise Goals and Review: Exercise Goals    Row Name 03/14/18 1325             Exercise Goals   Increase Physical Activity  Yes       Intervention  Provide advice, education, support and counseling about physical activity/exercise needs.;Develop an individualized exercise prescription for aerobic and resistive training based on initial evaluation findings, risk stratification, comorbidities and participant's personal goals.       Expected Outcomes  Short Term: Attend rehab on a regular basis to increase amount of physical activity.;Long Term: Add in home exercise to make exercise part of routine and to increase amount of  physical activity.;Long Term: Exercising regularly at least 3-5 days a week.       Increase Strength and Stamina  Yes       Intervention  Provide advice, education, support and counseling about physical activity/exercise needs.;Develop an individualized exercise prescription for aerobic and resistive training based on initial evaluation findings, risk stratification, comorbidities and participant's personal goals.       Expected Outcomes  Short Term: Increase workloads from initial exercise prescription for resistance, speed, and METs.;Short Term: Perform resistance training exercises routinely during rehab and add in resistance training at home;Long Term: Improve cardiorespiratory fitness, muscular endurance and strength as measured by increased METs and functional capacity (6MWT)       Able to understand and use rate of perceived exertion (RPE) scale  Yes       Intervention  Provide education and explanation on how to use RPE scale       Expected Outcomes  Short Term: Able to use RPE daily in rehab to express subjective intensity level;Long Term:  Able to use RPE to guide intensity level when exercising independently       Able to understand and use Dyspnea scale  Yes       Intervention  Provide education and explanation on how to use Dyspnea scale       Expected Outcomes  Short Term: Able to use Dyspnea scale daily in rehab to express subjective sense of shortness of breath during exertion;Long Term: Able to use Dyspnea scale to guide intensity level when exercising independently       Knowledge and understanding of Target Heart Rate Range (THRR)  Yes  Intervention  Provide education and explanation of THRR including how the numbers were predicted and where they are located for reference       Expected Outcomes  Short Term: Able to state/look up THRR;Short Term: Able to use daily as guideline for intensity in rehab;Long Term: Able to use THRR to govern intensity when exercising independently        Able to check pulse independently  Yes       Intervention  Provide education and demonstration on how to check pulse in carotid and radial arteries.;Review the importance of being able to check your own pulse for safety during independent exercise       Expected Outcomes  Short Term: Able to explain why pulse checking is important during independent exercise;Long Term: Able to check pulse independently and accurately       Understanding of Exercise Prescription  Yes       Intervention  Provide education, explanation, and written materials on patient's individual exercise prescription       Expected Outcomes  Short Term: Able to explain program exercise prescription;Long Term: Able to explain home exercise prescription to exercise independently          Exercise Goals Re-Evaluation :   Discharge Exercise Prescription (Final Exercise Prescription Changes):   Nutrition:  Target Goals: Understanding of nutrition guidelines, daily intake of sodium '1500mg'$ , cholesterol '200mg'$ , calories 30% from fat and 7% or less from saturated fats, daily to have 5 or more servings of fruits and vegetables.  Biometrics: Pre Biometrics - 03/14/18 1323      Pre Biometrics   Height  5' 3.25" (1.607 m)    Weight  206 lb 12.8 oz (93.8 kg)    Waist Circumference  40.5 inches    Hip Circumference  49 inches    Waist to Hip Ratio  0.83 %    BMI (Calculated)  36.32    Single Leg Stand  2.5 seconds        Nutrition Therapy Plan and Nutrition Goals: Nutrition Therapy & Goals - 03/14/18 1153      Personal Nutrition Goals   Nutrition Goal  weight loss    Personal Goal #2  Meet with the dietician      Intervention Plan   Intervention  Prescribe, educate and counsel regarding individualized specific dietary modifications aiming towards targeted core components such as weight, hypertension, lipid management, diabetes, heart failure and other comorbidities.;Nutrition handout(s) given to patient.    Expected  Outcomes  Short Term Goal: Understand basic principles of dietary content, such as calories, fat, sodium, cholesterol and nutrients.;Long Term Goal: Adherence to prescribed nutrition plan.       Nutrition Assessments: Nutrition Assessments - 03/14/18 1225      MEDFICTS Scores   Pre Score  6       Nutrition Goals Re-Evaluation:   Nutrition Goals Discharge (Final Nutrition Goals Re-Evaluation):   Psychosocial: Target Goals: Acknowledge presence or absence of significant depression and/or stress, maximize coping skills, provide positive support system. Participant is able to verbalize types and ability to use techniques and skills needed for reducing stress and depression.   Initial Review & Psychosocial Screening: Initial Psych Review & Screening - 03/14/18 1151      Initial Review   Current issues with  History of Depression;Current Stress Concerns    Source of Stress Concerns  Chronic Illness    Comments  Her illness was making her depressed and has since brought herself out of it. She thought  of her mom and it made her want to get better.       Family Dynamics   Good Support System?  Yes    Comments  She can look to her husband and her kids.      Barriers   Psychosocial barriers to participate in program  The patient should benefit from training in stress management and relaxation.      Screening Interventions   Interventions  Program counselor consult;Encouraged to exercise;To provide support and resources with identified psychosocial needs;Provide feedback about the scores to participant    Expected Outcomes  Short Term goal: Utilizing psychosocial counselor, staff and physician to assist with identification of specific Stressors or current issues interfering with healing process. Setting desired goal for each stressor or current issue identified.;Long Term Goal: Stressors or current issues are controlled or eliminated.;Short Term goal: Identification and review with  participant of any Quality of Life or Depression concerns found by scoring the questionnaire.;Long Term goal: The participant improves quality of Life and PHQ9 Scores as seen by post scores and/or verbalization of changes       Quality of Life Scores:  Scores of 19 and below usually indicate a poorer quality of life in these areas.  A difference of  2-3 points is a clinically meaningful difference.  A difference of 2-3 points in the total score of the Quality of Life Index has been associated with significant improvement in overall quality of life, self-image, physical symptoms, and general health in studies assessing change in quality of life.  PHQ-9: Recent Review Flowsheet Data    Depression screen [facility] 2/9 03/14/2018 12/12/2017   Decreased Interest 2 0   Down, Depressed, Hopeless 1 0   PHQ - 2 Score 3 0   Altered sleeping 3 -   Tired, decreased energy 3 -   Change in appetite 2 -   Feeling bad or failure about yourself  1 -   Trouble concentrating 1 -   Moving slowly or fidgety/restless 0 -   Suicidal thoughts 0 -   PHQ-9 Score 13 -   Difficult doing work/chores Somewhat difficult -     Interpretation of Total Score  Total Score Depression Severity:  1-4 = Minimal depression, 5-9 = Mild depression, 10-14 = Moderate depression, 15-19 = Moderately severe depression, 20-27 = Severe depression   Psychosocial Evaluation and Intervention:   Psychosocial Re-Evaluation:   Psychosocial Discharge (Final Psychosocial Re-Evaluation):   Education: Education Goals: Education classes will be provided on a weekly basis, covering required topics. Participant will state understanding/return demonstration of topics presented.  Learning Barriers/Preferences: Learning Barriers/Preferences - 03/14/18 1155      Learning Barriers/Preferences   Learning Barriers  Sight   wears glasses   Learning Preferences  None       Education Topics:  Initial Evaluation Education: - Verbal, written  and demonstration of respiratory meds, oximetry and breathing techniques. Instruction on use of nebulizers and MDIs and importance of monitoring MDI activations.   Pulmonary Rehab from 03/14/2018 in [facility] Cardiac and Pulmonary Rehab  Date  03/14/18  Educator  [facility]  Instruction Review Code  1- Verbalizes Understanding      General Nutrition Guidelines/Fats and Fiber: -Group instruction provided by verbal, written material, models and posters to present the general guidelines for heart healthy nutrition. Gives an explanation and review of dietary fats and fiber.   Controlling Sodium/Reading Food Labels: -Group verbal and written material supporting the discussion of sodium use in heart healthy nutrition.  Review and explanation with models, verbal and written materials for utilization of the food label.   Exercise Physiology & General Exercise Guidelines: - Group verbal and written instruction with models to review the exercise physiology of the cardiovascular system and associated critical values. Provides general exercise guidelines with specific guidelines to those with heart or lung disease.    Aerobic Exercise & Resistance Training: - Gives group verbal and written instruction on the various components of exercise. Focuses on aerobic and resistive training programs and the benefits of this training and how to safely progress through these programs.   Flexibility, Balance, Mind/Body Relaxation: Provides group verbal/written instruction on the benefits of flexibility and balance training, including mind/body exercise modes such as yoga, pilates and tai chi.  Demonstration and skill practice provided.   Stress and Anxiety: - Provides group verbal and written instruction about the health risks of elevated stress and causes of high stress.  Discuss the correlation between heart/lung disease and anxiety and treatment options. Review healthy ways to manage with stress and  anxiety.   Depression: - Provides group verbal and written instruction on the correlation between heart/lung disease and depressed mood, treatment options, and the stigmas associated with seeking treatment.   Exercise & Equipment Safety: - Individual verbal instruction and demonstration of equipment use and safety with use of the equipment.   Pulmonary Rehab from 03/14/2018 in [facility] Cardiac and Pulmonary Rehab  Date  03/14/18  Educator  [facility]  Instruction Review Code  1- Verbalizes Understanding      Infection Prevention: - Provides verbal and written material to individual with discussion of infection control including proper hand washing and proper equipment cleaning during exercise session.   Pulmonary Rehab from 03/14/2018 in [facility] Cardiac and Pulmonary Rehab  Date  03/14/18  Educator  [facility]  Instruction Review Code  1- Verbalizes Understanding      Falls Prevention: - Provides verbal and written material to individual with discussion of falls prevention and safety.   Pulmonary Rehab from 03/14/2018 in [facility] Cardiac and Pulmonary Rehab  Date  03/14/18  Educator  [facility]  Instruction Review Code  1- Verbalizes Understanding      Diabetes: - Individual verbal and written instruction to review signs/symptoms of diabetes, desired ranges of glucose level fasting, after meals and with exercise. Advice that pre and post exercise glucose checks will be done for 3 sessions at entry of program.   Pulmonary Rehab from 03/14/2018 in [facility] Cardiac and Pulmonary Rehab  Date  03/14/18  Educator  [facility]  Instruction Review Code  1- Verbalizes Understanding      Chronic Lung Diseases: - Group verbal and written instruction to review updates, respiratory medications, advancements in procedures and treatments. Discuss use of supplemental oxygen including available portable oxygen systems, continuous and intermittent flow rates, concentrators, personal use and safety guidelines. Review proper use of inhaler and  spacers. Provide informative websites for self-education.    Energy Conservation: - Provide group verbal and written instruction for methods to conserve energy, plan and organize activities. Instruct on pacing techniques, use of adaptive equipment and posture/positioning to relieve shortness of breath.   Triggers and Exacerbations: - Group verbal and written instruction to review types of environmental triggers and ways to prevent exacerbations. Discuss weather changes, air quality and the benefits of nasal washing. Review warning signs and symptoms to help prevent infections. Discuss techniques for effective airway clearance, coughing, and vibrations.   AED/CPR: - Group verbal and written instruction with the use of  models to demonstrate the basic use of the AED with the basic ABC's of resuscitation.   Anatomy and Physiology of the Lungs: - Group verbal and written instruction with the use of models to provide basic lung anatomy and physiology related to function, structure and complications of lung disease.   Anatomy & Physiology of the Heart: - Group verbal and written instruction and models provide basic cardiac anatomy and physiology, with the coronary electrical and arterial systems. Review of Valvular disease and Heart Failure   Cardiac Medications: - Group verbal and written instruction to review commonly prescribed medications for heart disease. Reviews the medication, class of the drug, and side effects.   Know Your Numbers and Risk Factors: -Group verbal and written instruction about important numbers in your health.  Discussion of what are risk factors and how they play a role in the disease process.  Review of Cholesterol, Blood Pressure, Diabetes, and BMI and the role they play in your overall health.   Sleep Hygiene: -Provides group verbal and written instruction about how sleep can affect your health.  Define sleep hygiene, discuss sleep cycles and impact of sleep  habits. Review good sleep hygiene tips.    Other: -Provides group and verbal instruction on various topics (see comments)    Knowledge Questionnaire Score: Knowledge Questionnaire Score - 03/14/18 1155      Knowledge Questionnaire Score   Pre Score  15/18   reviewed with patient       Core Components/Risk Factors/Patient Goals at Admission: Personal Goals and Risk Factors at Admission - 03/14/18 1156      Core Components/Risk Factors/Patient Goals on Admission    Weight Management  Yes;Weight Loss;Obesity    Intervention  Weight Management: Develop a combined nutrition and exercise program designed to reach desired caloric intake, while maintaining appropriate intake of nutrient and fiber, sodium and fats, and appropriate energy expenditure required for the weight goal.;Weight Management: Provide education and appropriate resources to help participant work on and attain dietary goals.;Weight Management/Obesity: Establish reasonable short term and long term weight goals.    Admit Weight  206 lb 12.8 oz (93.8 kg)    Goal Weight: Short Term  201 lb (91.2 kg)    Goal Weight: Long Term  160 lb (72.6 kg)    Expected Outcomes  Short Term: Continue to assess and modify interventions until short term weight is achieved;Long Term: Adherence to nutrition and physical activity/exercise program aimed toward attainment of established weight goal;Weight Maintenance: Understanding of the daily nutrition guidelines, which includes 25-35% calories from fat, 7% or less cal from saturated fats, less than '200mg'$  cholesterol, less than 1.5gm of sodium, & 5 or more servings of fruits and vegetables daily;Weight Loss: Understanding of general recommendations for a balanced deficit meal plan, which promotes 1-2 lb weight loss per week and includes a negative energy balance of [phone] kcal/d    Improve shortness of breath with ADL's  Yes    Intervention  Provide education, individualized exercise plan and daily  activity instruction to help decrease symptoms of SOB with activities of daily living.    Expected Outcomes  Short Term: Improve cardiorespiratory fitness to achieve a reduction of symptoms when performing ADLs;Long Term: Be able to perform more ADLs without symptoms or delay the onset of symptoms    Diabetes  Yes    Intervention  Provide education about signs/symptoms and action to take for hypo/hyperglycemia.;Provide education about proper nutrition, including hydration, and aerobic/resistive exercise prescription along with prescribed medications to  achieve blood glucose in normal ranges: Fasting glucose 65-99 mg/dL    Expected Outcomes  Short Term: Participant verbalizes understanding of the signs/symptoms and immediate care of hyper/hypoglycemia, proper foot care and importance of medication, aerobic/resistive exercise and nutrition plan for blood glucose control.;Long Term: Attainment of HbA1C < 7%.    Heart Failure  Yes    Intervention  Provide a combined exercise and nutrition program that is supplemented with education, support and counseling about heart failure. Directed toward relieving symptoms such as shortness of breath, decreased exercise tolerance, and extremity edema.    Expected Outcomes  Improve functional capacity of life;Short term: Attendance in program 2-3 days a week with increased exercise capacity. Reported lower sodium intake. Reported increased fruit and vegetable intake. Reports medication compliance.;Short term: Daily weights obtained and reported for increase. Utilizing diuretic protocols set by physician.;Long term: Adoption of self-care skills and reduction of barriers for early signs and symptoms recognition and intervention leading to self-care maintenance.    Hypertension  Yes    Intervention  Provide education on lifestyle modifcations including regular physical activity/exercise, weight management, moderate sodium restriction and increased consumption of fresh fruit,  vegetables, and low fat dairy, alcohol moderation, and smoking cessation.;Monitor prescription use compliance.    Expected Outcomes  Short Term: Continued assessment and intervention until BP is < 140/8m HG in hypertensive participants. < 130/820mHG in hypertensive participants with diabetes, heart failure or chronic kidney disease.;Long Term: Maintenance of blood pressure at goal levels.    Lipids  Yes    Intervention  Provide education and support for participant on nutrition & aerobic/resistive exercise along with prescribed medications to achieve LDL '70mg'$ , HDL >'40mg'$ .    Expected Outcomes  Short Term: Participant states understanding of desired cholesterol values and is compliant with medications prescribed. Participant is following exercise prescription and nutrition guidelines.;Long Term: Cholesterol controlled with medications as prescribed, with individualized exercise RX and with personalized nutrition plan. Value goals: LDL < '70mg'$ , HDL > 40 mg.       Core Components/Risk Factors/Patient Goals Review:    Core Components/Risk Factors/Patient Goals at Discharge (Final Review):    ITP Comments: ITP Comments    Row Name 03/14/18 1220           ITP Comments  Medical Evaluation completed. Chart sent for review and changes to Dr. MaEmily Filbertirector of [place]          Comments: Initial ITP

## 2018-03-14 NOTE — Patient Instructions (Signed)
Patient Instructions  Patient Details  Name: Brenda Wu MRN: [id number] Date of Birth: 27-Jun-1955 Referring Provider:  [name], MD Below are your personal goals for exercise, nutrition, and risk factors. Our goal is to help you stay on track towards obtaining and maintaining these goals. We will be discussing your progress on these goals with you throughout the program. Initial Exercise Prescription: Initial Exercise Prescription - 03/14/18 1300      Date of Initial Exercise RX and Referring Provider   Date  03/14/18    Referring Provider  Gollan      Treadmill   MPH  1.5    Grade  0    Minutes  15    METs  2.15      NuStep   Level  2    SPM  80    Minutes  15    METs  2      Biostep-RELP   Level  2    SPM  50    Minutes  15    METs  2      Prescription Details   Frequency (times per week)  3    Duration  Progress to 45 minutes of aerobic exercise without signs/symptoms of physical distress      Intensity   THRR 40-80% of Max Heartrate  114-144    Ratings of Perceived Exertion  11-15    Perceived Dyspnea  0-4      Resistance Training   Training Prescription  Yes    Weight  3 lb    Reps  10-15      Exercise Goals: Frequency: Be able to perform aerobic exercise two to three times per week in program working toward 2-5 days per week of home exercise. Intensity: Work with a perceived exertion of 11 (fairly light) - 15 (hard) while following your exercise prescription.  We will make changes to your prescription with you as you progress through the program.  Duration: Be able to do 30 to 45 minutes of continuous aerobic exercise in addition to a 5 minute warm-up and a 5 minute cool-down routine. Nutrition Goals: Your personal nutrition goals will be established when you do your nutrition analysis with the dietician. The following are general nutrition guidelines to follow: Cholesterol < 200mg /day Sodium < 1500mg /day Fiber: Women over 50 yrs - 21 grams  per day Personal Goals: Personal Goals and Risk Factors at Admission - 03/14/18 1156      Core Components/Risk Factors/Patient Goals on Admission    Weight Management  Yes;Weight Loss;Obesity    Intervention  Weight Management: Develop a combined nutrition and exercise program designed to reach desired caloric intake, while maintaining appropriate intake of nutrient and fiber, sodium and fats, and appropriate energy expenditure required for the weight goal.;Weight Management: Provide education and appropriate resources to help participant work on and attain dietary goals.;Weight Management/Obesity: Establish reasonable short term and long term weight goals.    Admit Weight  206 lb 12.8 oz (93.8 kg)    Goal Weight: Short Term  201 lb (91.2 kg)    Goal Weight: Long Term  160 lb (72.6 kg)    Expected Outcomes  Short Term: Continue to assess and modify interventions until short term weight is achieved;Long Term: Adherence to nutrition and physical activity/exercise program aimed toward attainment of established weight goal;Weight Maintenance: Understanding of the daily nutrition guidelines, which includes 25-35% calories from fat, 7% or less cal from saturated fats, less than 200mg  cholesterol, less than  1.5gm of sodium, & 5 or more servings of fruits and vegetables daily;Weight Loss: Understanding of general recommendations for a balanced deficit meal plan, which promotes 1-2 lb weight loss per week and includes a negative energy balance of [phone] kcal/d    Improve shortness of breath with ADL's  Yes    Intervention  Provide education, individualized exercise plan and daily activity instruction to help decrease symptoms of SOB with activities of daily living.    Expected Outcomes  Short Term: Improve cardiorespiratory fitness to achieve a reduction of symptoms when performing ADLs;Long Term: Be able to perform more ADLs without symptoms or delay the onset of symptoms    Diabetes  Yes    Intervention   Provide education about signs/symptoms and action to take for hypo/hyperglycemia.;Provide education about proper nutrition, including hydration, and aerobic/resistive exercise prescription along with prescribed medications to achieve blood glucose in normal ranges: Fasting glucose 65-99 mg/dL    Expected Outcomes  Short Term: Participant verbalizes understanding of the signs/symptoms and immediate care of hyper/hypoglycemia, proper foot care and importance of medication, aerobic/resistive exercise and nutrition plan for blood glucose control.;Long Term: Attainment of HbA1C < 7%.    Heart Failure  Yes    Intervention  Provide a combined exercise and nutrition program that is supplemented with education, support and counseling about heart failure. Directed toward relieving symptoms such as shortness of breath, decreased exercise tolerance, and extremity edema.    Expected Outcomes  Improve functional capacity of life;Short term: Attendance in program 2-3 days a week with increased exercise capacity. Reported lower sodium intake. Reported increased fruit and vegetable intake. Reports medication compliance.;Short term: Daily weights obtained and reported for increase. Utilizing diuretic protocols set by physician.;Long term: Adoption of self-care skills and reduction of barriers for early signs and symptoms recognition and intervention leading to self-care maintenance.    Hypertension  Yes    Intervention  Provide education on lifestyle modifcations including regular physical activity/exercise, weight management, moderate sodium restriction and increased consumption of fresh fruit, vegetables, and low fat dairy, alcohol moderation, and smoking cessation.;Monitor prescription use compliance.    Expected Outcomes  Short Term: Continued assessment and intervention until BP is < 140/71mm HG in hypertensive participants. < 130/56mm HG in hypertensive participants with diabetes, heart failure or chronic kidney  disease.;Long Term: Maintenance of blood pressure at goal levels.    Lipids  Yes    Intervention  Provide education and support for participant on nutrition & aerobic/resistive exercise along with prescribed medications to achieve LDL 70mg , HDL >40mg .    Expected Outcomes  Short Term: Participant states understanding of desired cholesterol values and is compliant with medications prescribed. Participant is following exercise prescription and nutrition guidelines.;Long Term: Cholesterol controlled with medications as prescribed, with individualized exercise RX and with personalized nutrition plan. Value goals: LDL < 70mg , HDL > 40 mg.      Tobacco Use Initial Evaluation: Social History   Tobacco Use  Smoking Status Former Smoker  . Packs/day: 1.00  . Years: 30.00  . Pack years: 30.00  . Types: Cigarettes  . Last attempt to quit: 02/22/2002  . Years since quitting: 16.0  Smokeless Tobacco Never Used   Exercise Goals and Review: Exercise Goals    Row Name 03/14/18 1325             Exercise Goals   Increase Physical Activity  Yes       Intervention  Provide advice, education, support and counseling about physical activity/exercise needs.;Develop an  individualized exercise prescription for aerobic and resistive training based on initial evaluation findings, risk stratification, comorbidities and participant's personal goals.       Expected Outcomes  Short Term: Attend rehab on a regular basis to increase amount of physical activity.;Long Term: Add in home exercise to make exercise part of routine and to increase amount of physical activity.;Long Term: Exercising regularly at least 3-5 days a week.       Increase Strength and Stamina  Yes       Intervention  Provide advice, education, support and counseling about physical activity/exercise needs.;Develop an individualized exercise prescription for aerobic and resistive training based on initial evaluation findings, risk stratification,  comorbidities and participant's personal goals.       Expected Outcomes  Short Term: Increase workloads from initial exercise prescription for resistance, speed, and METs.;Short Term: Perform resistance training exercises routinely during rehab and add in resistance training at home;Long Term: Improve cardiorespiratory fitness, muscular endurance and strength as measured by increased METs and functional capacity ( )       Able to understand and use rate of perceived exertion (RPE) scale  Yes       Intervention  Provide education and explanation on how to use RPE scale       Expected Outcomes  Short Term: Able to use RPE daily in rehab to express subjective intensity level;Long Term:  Able to use RPE to guide intensity level when exercising independently       Able to understand and use Dyspnea scale  Yes       Intervention  Provide education and explanation on how to use Dyspnea scale       Expected Outcomes  Short Term: Able to use Dyspnea scale daily in rehab to express subjective sense of shortness of breath during exertion;Long Term: Able to use Dyspnea scale to guide intensity level when exercising independently       Knowledge and understanding of Target Heart Rate Range (THRR)  Yes       Intervention  Provide education and explanation of THRR including how the numbers were predicted and where they are located for reference       Expected Outcomes  Short Term: Able to state/look up THRR;Short Term: Able to use daily as guideline for intensity in rehab;Long Term: Able to use THRR to govern intensity when exercising independently       Able to check pulse independently  Yes       Intervention  Provide education and demonstration on how to check pulse in carotid and radial arteries.;Review the importance of being able to check your own pulse for safety during independent exercise       Expected Outcomes  Short Term: Able to explain why pulse checking is important during independent exercise;Long  Term: Able to check pulse independently and accurately       Understanding of Exercise Prescription  Yes       Intervention  Provide education, explanation, and written materials on patient's individual exercise prescription       Expected Outcomes  Short Term: Able to explain program exercise prescription;Long Term: Able to explain home exercise prescription to exercise independently         Copy of goals given to participant.

## 2018-03-20 ENCOUNTER — Encounter: Payer: Medicare Other | Admitting: *Deleted

## 2018-03-20 DIAGNOSIS — I110 Hypertensive heart disease with heart failure: Secondary | ICD-10-CM | POA: Diagnosis not present

## 2018-03-20 DIAGNOSIS — I5042 Chronic combined systolic (congestive) and diastolic (congestive) heart failure: Secondary | ICD-10-CM

## 2018-03-20 LAB — GLUCOSE, CAPILLARY
GLUCOSE-CAPILLARY: 188 mg/dL — AB (ref 70–99)
GLUCOSE-CAPILLARY: 133 mg/dL — AB (ref 70–99)

## 2018-03-20 NOTE — Progress Notes (Signed)
Daily Session Note  Patient Details  Name: Brenda Wu MRN: [id number] Date of Birth: 04-24-1955 Referring Provider:     Pulmonary Rehab from 03/14/2018 in [facility] Pulmonary Rehab  Referring Provider  Gollan      Encounter Date: 03/20/2018  Check In: Session Check In - 03/20/18 1017      Check-In   Supervising physician immediately available to respond to emergencies  [facility] immediately available ER MD    Physician(s)  Dr. Jodell Cipro and Dr. Quentin Cornwall    Location  [facility]-Cardiac & Pulmonary Rehab    Staff Present  Earlean Shawl, BS, ACSM CEP, Exercise Physiologist;[facility], [place], ACSM CEP, Exercise Physiologist    Medication changes reported      No    Fall or balance concerns reported     No    Tobacco Cessation  No Change    Warm-up and Cool-down  Performed as group-led instruction    Resistance Training Performed  Yes    VAD Patient?  No    PAD/SET Patient?  No      Pain Assessment   Currently in Pain?  No/denies    Multiple Pain Sites  No          Social History   Tobacco Use  Smoking Status Former Smoker  . Packs/day: 1.00  . Years: 30.00  . Pack years: 30.00  . Types: Cigarettes  . Last attempt to quit: 02/22/2002  . Years since quitting: 16.0  Smokeless Tobacco Never Used    Goals Met:  Proper associated with RPD/PD & O2 Sat Exercise tolerated well Personal goals reviewed No report of cardiac concerns or symptoms Strength training completed today  Goals Unmet:  Not Applicable  Comments: First full day of exercise!  Patient was oriented to gym and equipment including functions, settings, policies, and procedures.  Patient's individual exercise prescription and treatment plan were reviewed.  All starting workloads were established based on the results of the 6 minute walk test done at initial orientation visit.  The plan for exercise progression was also introduced and progression will be customized based on patient's  performance and goals.    Dr. Emily Filbert is Medical Director for [facility] and [facility].

## 2018-03-22 DIAGNOSIS — I110 Hypertensive heart disease with heart failure: Secondary | ICD-10-CM | POA: Diagnosis not present

## 2018-03-22 DIAGNOSIS — I5042 Chronic combined systolic (congestive) and diastolic (congestive) heart failure: Secondary | ICD-10-CM

## 2018-03-22 LAB — GLUCOSE, CAPILLARY
GLUCOSE-CAPILLARY: 274 mg/dL — AB (ref 70–99)
GLUCOSE-CAPILLARY: 207 mg/dL — AB (ref 70–99)

## 2018-03-22 NOTE — Progress Notes (Signed)
Daily Session Note  Patient Details  Name: Brenda Wu MRN: [id number] Date of Birth: 1956-01-08 Referring Provider:     Pulmonary Rehab from 03/14/2018 in [facility] Cardiac and Pulmonary Rehab  Referring Provider  Gollan      Encounter Date: 03/22/2018  Check In: Session Check In - 03/22/18 1032      Check-In   Supervising physician immediately available to respond to emergencies  See telemetry face sheet for immediately available ER MD    Physician(s)  Jimmye Norman and [facility]    Location  [facility]-Cardiac & Pulmonary Rehab    Staff Present  Alberteen Sam, MA, RCEP, CCRP, Exercise Physiologist;Joseph Foy [place], [place], ACSM CEP, Exercise Physiologist    Medication changes reported      No    Fall or balance concerns reported     No    Warm-up and Cool-down  Performed as group-led instruction    Resistance Training Performed  Yes    VAD Patient?  No    PAD/SET Patient?  No      Pain Assessment   Currently in Pain?  No/denies    Multiple Pain Sites  No          Social History   Tobacco Use  Smoking Status Former Smoker  . Packs/day: 1.00  . Years: 30.00  . Pack years: 30.00  . Types: Cigarettes  . Last attempt to quit: 02/22/2002  . Years since quitting: 16.0  Smokeless Tobacco Never Used    Goals Met:  Independence with exercise equipment Exercise tolerated well No report of cardiac concerns or symptoms Strength training completed today  Goals Unmet:  Not Applicable  Comments: Pt able to follow exercise prescription today without complaint.  Will continue to monitor for progression.    Dr. Emily Filbert is Medical Director for [facility].

## 2018-03-24 DIAGNOSIS — I110 Hypertensive heart disease with heart failure: Secondary | ICD-10-CM | POA: Diagnosis not present

## 2018-03-24 DIAGNOSIS — I5042 Chronic combined systolic (congestive) and diastolic (congestive) heart failure: Secondary | ICD-10-CM

## 2018-03-24 LAB — GLUCOSE, CAPILLARY
Glucose-Capillary: 174 mg/dL — ABNORMAL HIGH (ref 70–99)
Glucose-Capillary: 162 mg/dL — ABNORMAL HIGH (ref 70–99)

## 2018-03-24 NOTE — Progress Notes (Signed)
Daily Session Note  Patient Details  Name: Brenda Wu MRN: [id number] Date of Birth: Jun 15, 1955 Referring Provider:     Pulmonary Rehab from 03/14/2018 in [facility] Cardiac and Pulmonary Rehab  Referring Provider  Gollan      Encounter Date: 03/24/2018  Check In:      Social History   Tobacco Use  Smoking Status Former Smoker  . Packs/day: 1.00  . Years: 30.00  . Pack years: 30.00  . Types: Cigarettes  . Last attempt to quit: 02/22/2002  . Years since quitting: 16.0  Smokeless Tobacco Never Used    Goals Met:  Proper associated with RPD/PD & O2 Sat Independence with exercise equipment Exercise tolerated well Strength training completed today  Goals Unmet:  Not Applicable  Comments: Pt able to follow exercise prescription today without complaint.  Will continue to monitor for progression.   Dr. Emily Filbert is Medical Director for [place] and [facility].

## 2018-03-27 ENCOUNTER — Encounter: Payer: Medicare HMO | Attending: Cardiovascular Disease | Admitting: *Deleted

## 2018-03-27 DIAGNOSIS — I110 Hypertensive heart disease with heart failure: Secondary | ICD-10-CM | POA: Insufficient documentation

## 2018-03-27 DIAGNOSIS — Z794 Long term (current) use of insulin: Secondary | ICD-10-CM | POA: Insufficient documentation

## 2018-03-27 DIAGNOSIS — I252 Old myocardial infarction: Secondary | ICD-10-CM | POA: Insufficient documentation

## 2018-03-27 DIAGNOSIS — I428 Other cardiomyopathies: Secondary | ICD-10-CM | POA: Insufficient documentation

## 2018-03-27 DIAGNOSIS — Z8673 Personal history of transient ischemic attack (TIA), and cerebral infarction without residual deficits: Secondary | ICD-10-CM | POA: Diagnosis not present

## 2018-03-27 DIAGNOSIS — E119 Type 2 diabetes mellitus without complications: Secondary | ICD-10-CM | POA: Insufficient documentation

## 2018-03-27 DIAGNOSIS — I5042 Chronic combined systolic (congestive) and diastolic (congestive) heart failure: Secondary | ICD-10-CM | POA: Diagnosis present

## 2018-03-27 DIAGNOSIS — I071 Rheumatic tricuspid insufficiency: Secondary | ICD-10-CM | POA: Insufficient documentation

## 2018-03-27 DIAGNOSIS — E7800 Pure hypercholesterolemia, unspecified: Secondary | ICD-10-CM | POA: Insufficient documentation

## 2018-03-27 DIAGNOSIS — J449 Chronic obstructive pulmonary disease, unspecified: Secondary | ICD-10-CM | POA: Insufficient documentation

## 2018-03-27 DIAGNOSIS — Z87891 Personal history of nicotine dependence: Secondary | ICD-10-CM | POA: Diagnosis not present

## 2018-03-27 DIAGNOSIS — Z7982 Long term (current) use of aspirin: Secondary | ICD-10-CM | POA: Insufficient documentation

## 2018-03-27 DIAGNOSIS — Z79899 Other long term (current) drug therapy: Secondary | ICD-10-CM | POA: Diagnosis not present

## 2018-03-27 NOTE — Progress Notes (Signed)
Daily Session Note  Patient Details  Name: Brenda Wu MRN: [id number] Date of Birth: [birth year] Referring Provider:     Pulmonary Rehab from 03/14/2018 in [facility] Card[facility] and Pulmonary Rehab  Referring Provider  Gollan      Encounter Date: 03/27/2018  Check In: Session Check In - 03/27/18 1017      Check-In   Supervising physician immediately available to respond to emergencies  [facility] immediately available ER MD    Physician(s)  Quentin Cornwall and Joni Fears    Location  [facility]-Cardiac & Pulmonary Rehab    Staff Present  Earlean Shawl, BS, ACSM CEP, Exercise Physiologist;Amanda Oletta Darter, BA, ACSM CEP, Exercise Physiologist;Jessica Cicero, MA, RCEP, CCRP, Exercise Physiologist    Medication changes reported      No    Fall or balance concerns reported     Yes    Comments  Patient reported tripping over a cord over the weekend and falling. She stated that she was a little sore but otherwise reported no injuries and also stated that she felt fine to exercise today.     Tobacco Cessation  No Change    Warm-up and Cool-down  Performed as group-led instruction    Resistance Training Performed  Yes    VAD Patient?  No    PAD/SET Patient?  No      Pain Assessment   Currently in Pain?  No/denies    Multiple Pain Sites  No          Social History   Tobacco Use  Smoking Status Former Smoker  . Packs/day: 1.00  . Years: 30.00  . Pack years: 30.00  . Types: Cigarettes  . Last attempt to quit: 02/22/2002  . Years since quitting: 16.1  Smokeless Tobacco Never Used    Goals Met:  Proper associated with RPD/PD & O2 Sat Independence with exercise equipment Exercise tolerated well No report of cardiac concerns or symptoms Strength training completed today  Goals Unmet:  Not Applicable  Comments: Pt able to follow exercise prescription today without complaint.  Will continue to monitor for progression.    Dr. Emily Filbert is Medical Director for [place] and  [facility].

## 2018-03-29 DIAGNOSIS — I5042 Chronic combined systolic (congestive) and diastolic (congestive) heart failure: Secondary | ICD-10-CM

## 2018-03-29 DIAGNOSIS — I110 Hypertensive heart disease with heart failure: Secondary | ICD-10-CM | POA: Diagnosis not present

## 2018-03-29 NOTE — Progress Notes (Signed)
Daily Session Note  Patient Details  Name: Brenda Wu MRN: [id number] Date of Birth: 10/13/1955 Referring Provider:     Pulmonary Rehab from 03/14/2018 in [facility] Cardiac and Pulmonary Rehab  Referring Provider  Gollan      Encounter Date: 03/29/2018  Check In: Session Check In - 03/29/18 1131      Check-In   Supervising physician immediately available to respond to emergencies  [facility] immediately available ER MD    Physician(s)  Jimmye Norman and Alfred Levins    Location  [facility]-Cardiac & Pulmonary Rehab    Staff Present  Alberteen Sam, MA, RCEP, CCRP, Exercise Physiologist;Joseph Foy [place], [place], ACSM CEP, Exercise Physiologist    Medication changes reported      No    Fall or balance concerns reported     No    Warm-up and Cool-down  Performed as group-led instruction    Resistance Training Performed  Yes    VAD Patient?  No    PAD/SET Patient?  No      Pain Assessment   Currently in Pain?  No/denies    Multiple Pain Sites  No          Social History   Tobacco Use  Smoking Status Former Smoker  . Packs/day: 1.00  . Years: 30.00  . Pack years: 30.00  . Types: Cigarettes  . Last attempt to quit: 02/22/2002  . Years since quitting: 16.1  Smokeless Tobacco Never Used    Goals Met:  Proper associated with RPD/PD & O2 Sat Independence with exercise equipment Exercise tolerated well Strength training completed today  Goals Unmet:  Not Applicable  Comments: Pt able to follow exercise prescription today without complaint.  Will continue to monitor for progression.    Dr. Emily Filbert is Medical Director for [place] and [facility].

## 2018-03-31 ENCOUNTER — Encounter: Payer: Medicare HMO | Admitting: *Deleted

## 2018-03-31 DIAGNOSIS — I5042 Chronic combined systolic (congestive) and diastolic (congestive) heart failure: Secondary | ICD-10-CM

## 2018-03-31 DIAGNOSIS — I110 Hypertensive heart disease with heart failure: Secondary | ICD-10-CM | POA: Diagnosis not present

## 2018-03-31 NOTE — Progress Notes (Signed)
Daily Session Note  Patient Details  Name: Brenda Wu MRN: [id number] Date of Birth: 11/14/1955 Referring Provider:     Pulmonary Rehab from 03/14/2018 in [facility]  Referring Provider  [place]      Encounter Date: 03/31/2018  Check In: Session Check In - 03/31/18 1015      Check-In   Supervising physician immediately available to respond to emergencies  [facility] immediately available ER MD    Physician(s)  Dr. Jimmye Norman and Corky Downs    Location  [facility]    Staff Present  Renita Papa, RN BSN;[facility], [place], ACSM CEP, Exercise Physiologist    Medication changes reported      No    Fall or balance concerns reported     No    Tobacco Cessation  No Change    Warm-up and Cool-down  Performed as group-led instruction    Resistance Training Performed  Yes    VAD Patient?  No    PAD/SET Patient?  No      Pain Assessment   Currently in Pain?  No/denies          Social History   Tobacco Use  Smoking Status Former Smoker  . Packs/day: 1.00  . Years: 30.00  . Pack years: 30.00  . Types: Cigarettes  . Last attempt to quit: 02/22/2002  . Years since quitting: 16.1  Smokeless Tobacco Never Used    Goals Met:  Proper associated with RPD/PD & O2 Sat Independence with exercise equipment Exercise tolerated well No report of cardiac concerns or symptoms Strength training completed today  Goals Unmet:  Not Applicable  Comments: Pt able to follow exercise prescription today without complaint.  Will continue to monitor for progression.    Dr. Emily Filbert is Medical Director for [facility].

## 2018-04-03 ENCOUNTER — Other Ambulatory Visit: Payer: Self-pay | Admitting: Pulmonary Disease

## 2018-04-03 DIAGNOSIS — I5042 Chronic combined systolic (congestive) and diastolic (congestive) heart failure: Secondary | ICD-10-CM

## 2018-04-03 NOTE — Progress Notes (Signed)
Pulmonary Individual Treatment Plan  Patient Details  Name: Brenda Wu MRN: [id number] Date of Birth: 23-Nov-1955 Referring Provider:     Pulmonary Rehab from 03/14/2018 in [facility] Cardiac and Pulmonary Rehab  Referring Provider  Gollan      Initial Encounter Date:    Pulmonary Rehab from 03/14/2018 in [facility] Cardiac and Pulmonary Rehab  Date  03/14/18      Visit Diagnosis: Heart failure, systolic and diastolic, chronic ([place])  Patient's Home Medications on Admission:  Current Outpatient Medications:  .  albuterol (PROVENTIL HFA;VENTOLIN HFA) 108 (90 Base) MCG/ACT inhaler, Inhale 2 puffs into the lungs 4 (four) times daily as needed for wheezing or shortness of breath. , Disp: , Rfl:  .  albuterol (PROVENTIL) (2.5 MG/3ML) 0.083% nebulizer solution, Inhale 2.5 mg into the lungs every 6 (six) hours as needed for wheezing or shortness of breath. , Disp: , Rfl:  .  aspirin EC 81 MG tablet, Take 1 tablet (81 mg total) by mouth daily., Disp: 32 tablet, Rfl: 0 .  atorvastatin (LIPITOR) 40 MG tablet, Take 40 mg by mouth every evening. , Disp: , Rfl:  .  budesonide-formoterol (SYMBICORT) 160-4.5 MCG/ACT inhaler, Inhale 2 puffs into the lungs 2 (two) times daily., Disp: , Rfl:  .  diltiazem (CARDIZEM CD) 180 MG 24 hr capsule, Take 180 mg by mouth daily., Disp: , Rfl:  .  ferrous sulfate 325 (65 FE) MG tablet, Take 325 mg by mouth daily with breakfast., Disp: , Rfl:  .  glipiZIDE (GLUCOTROL) 10 MG tablet, Take 10 mg by mouth 2 (two) times daily before a meal. , Disp: , Rfl:  .  guaiFENesin (MUCINEX) 600 MG 12 hr tablet, Take 600 mg by mouth 2 (two) times daily as needed. , Disp: , Rfl:  .  Insulin Glargine (LANTUS SOLOSTAR) 100 UNIT/ML Solostar Pen, Inject 20-25 Units into the skin daily. , Disp: , Rfl:  .  lamoTRIgine (LAMICTAL) 200 MG tablet, Take 200 mg by mouth 2 (two) times daily. , Disp: , Rfl:  .  losartan (COZAAR) 50 MG tablet, Take 50 mg by mouth daily., Disp: , Rfl:  .  Multiple Vitamin  (MULTI-VITAMINS) TABS, Take 1 tablet by mouth daily., Disp: , Rfl:  .  niacin 500 MG tablet, Take 500 mg by mouth daily., Disp: , Rfl:  .  potassium chloride SA (K-DUR,KLOR-CON) 20 MEQ tablet, Take 1 tablet (20 mEq total) by mouth daily., Disp: 30 tablet, Rfl: 2 .  pregabalin (LYRICA) 75 MG capsule, Take 75 mg by mouth 2 (two) times daily., Disp: , Rfl:  .  senna (SENOKOT) 8.6 MG TABS tablet, Take 1 tablet by mouth daily. , Disp: , Rfl:  .  sitaGLIPtin (JANUVIA) 50 MG tablet, Take 50 mg by mouth daily., Disp: , Rfl:  .  tiotropium (SPIRIVA) 18 MCG inhalation capsule, Place 18 mcg into inhaler and inhale daily., Disp: , Rfl:  .  topiramate (TOPAMAX) 200 MG tablet, Take 200 mg by mouth 2 (two) times daily., Disp: , Rfl:  .  torsemide (DEMADEX) 20 MG tablet, Take 2 tablets (40 mg total) by mouth daily., Disp: 90 tablet, Rfl: 0 .  vitamin B-12 (CYANOCOBALAMIN) 500 MCG tablet, Take 500 mcg by mouth daily., Disp: , Rfl:   Past Medical History: Past Medical History:  Diagnosis Date  . Chronic combined systolic (congestive) and diastolic (congestive) heart failure ([place])    a. 10/2017 Echo: EF 45-50%, diff HK. Mild to mod MR, mildly dil LA. Mod dil RV w/ mildly  reduced RV fxn.  [name] COPD (chronic obstructive pulmonary disease) ([place])   . Depression   . Diabetes mellitus without complication ([place])   . Hypercholesteremia   . Hypertension   . Morbid obesity ([place])   . NICM (nonischemic cardiomyopathy) ([place])   . NSTEMI (non-ST elevated myocardial infarction) ([place])    a. 12/2017 -->Cath: nl cors. EF 45-50%.  . Seizures ([place])   . Stroke ([place])   . Tricuspid regurgitation    a. 10/2017 Echo: Mild to mod TR.    Tobacco Use: Social History   Tobacco Use  Smoking Status Former Smoker  . Packs/day: 1.00  . Years: 30.00  . Pack years: 30.00  . Types: Cigarettes  . Last attempt to quit: 02/22/2002  . Years since quitting: 16.1  Smokeless Tobacco Never Used    Labs: Recent Chemical engineer    Labs  for ITP Cardiac and Pulmonary Rehab Latest Ref Rng & Units 11/23/2017 01/05/2018 01/05/2018 01/05/2018 01/06/2018   Hemoglobin A1c 4.8 - 5.6 % 8.2(H) - - 7.4(H) -   PHART 7.350 - 7.450 - - 7.24(L) - 7.32(L)   PCO2ART 32.0 - 48.0 mmHg - - 84(HH) - 65(H)   HCO3 20.0 - 28.0 mmol/L 39.0(H) 34.4(H) 35.2(H) - 33.5(H)   O2SAT % - 99.2 90.2 - 81.6       Pulmonary Assessment Scores: Pulmonary Assessment Scores    Row Name 03/14/18 1222         ADL UCSD   ADL Phase  Entry     SOB Score total  54     Rest  0     Walk  3     Stairs  5     Bath  2     Dress  1     Shop  4       CAT Score   CAT Score  12       mMRC Score   mMRC Score  3        Pulmonary Function Assessment: Pulmonary Function Assessment - 03/14/18 1150      Breath   Bilateral Breath Sounds  Clear    Shortness of Breath  Yes;Fear of Shortness of Breath;Limiting activity;Panic with Shortness of Breath       Exercise Target Goals: Exercise Program Goal: Individual exercise prescription set using results from initial 6 min walk test and THRR while considering  patient's activity barriers and safety.   Exercise Prescription Goal: Initial exercise prescription builds to 30-45 minutes a day of aerobic activity, 2-3 days per week.  Home exercise guidelines will be given to patient during program as part of exercise prescription that the participant will acknowledge.  Activity Barriers & Risk Stratification:   6 Minute Walk: 6 Minute Walk    Row Name 03/14/18 1326         6 Minute Walk   Phase  Initial     Distance  765 feet     Walk Time  5.25 minutes     # of Rest Breaks  1     MPH  1.65     METS  2.2     RPE  17     Perceived Dyspnea   2.5     VO2 Peak  7.7     Symptoms  No     Resting HR  84 bpm     Resting BP  94/60     Resting Oxygen Saturation   96 %  Exercise Oxygen Saturation  during 6 min walk  83 %     Max Ex. HR  139 bpm     Max Ex. BP  120/64     2 Minute Post BP  116/58        Interval HR   1 Minute HR  84     3 Minute HR  116     4 Minute HR  131     5 Minute HR  129     6 Minute HR  139     2 Minute Post HR  96     Interval Heart Rate?  Yes       Interval Oxygen   Interval Oxygen?  Yes     Baseline Oxygen Saturation %  96 %     1 Minute Oxygen Saturation %  91 %     1 Minute Liters of Oxygen  2 L     2 Minute Liters of Oxygen  2 L     3 Minute Oxygen Saturation %  90 %     3 Minute Liters of Oxygen  2 L     4 Minute Oxygen Saturation %  87 %     4 Minute Liters of Oxygen  2 L     5 Minute Oxygen Saturation %  85 %     5 Minute Liters of Oxygen  2 L     6 Minute Oxygen Saturation %  83 %     6 Minute Liters of Oxygen  2 L     2 Minute Post Oxygen Saturation %  93 %     2 Minute Post Liters of Oxygen  2 L       Oxygen Initial Assessment: Oxygen Initial Assessment - 03/14/18 1149      Home Oxygen   Home Oxygen Device  Home Concentrator;E-Tanks    Sleep Oxygen Prescription  Continuous;BiPAP    Liters per minute  2    Home Exercise Oxygen Prescription  Continuous    Liters per minute  2    Home at Rest Exercise Oxygen Prescription  Continuous    Liters per minute  2    Compliance with Home Oxygen Use  Yes      Initial 6 min Walk   Oxygen Used  Continuous;E-Tanks    Liters per minute  2      Program Oxygen Prescription   Program Oxygen Prescription  Continuous;E-Tanks    Liters per minute  2      Intervention   Short Term Goals  To learn and exhibit compliance with exercise, home and travel O2 prescription;To learn and understand importance of monitoring SPO2 with pulse oximeter and demonstrate accurate use of the pulse oximeter.;To learn and understand importance of maintaining oxygen saturations>88%;To learn and demonstrate proper pursed lip breathing techniques or other breathing techniques.;To learn and demonstrate proper use of respiratory medications    Long  Term Goals  Exhibits compliance with exercise, home and travel O2  prescription;Verbalizes importance of monitoring SPO2 with pulse oximeter and return demonstration;Maintenance of O2 saturations>88%;Exhibits proper breathing techniques, such as pursed lip breathing or other method taught during program session;Compliance with respiratory medication;Demonstrates proper use of MDI's       Oxygen Re-Evaluation: Oxygen Re-Evaluation    Row Name 03/20/18 1025             Program Oxygen Prescription   Program Oxygen Prescription  Continuous;E-Tanks  Liters per minute  2         Home Oxygen   Home Oxygen Device  Home Concentrator;E-Tanks       Sleep Oxygen Prescription  Continuous;BiPAP       Liters per minute  2       Home Exercise Oxygen Prescription  Continuous       Liters per minute  2       Home at Rest Exercise Oxygen Prescription  Continuous       Liters per minute  2       Compliance with Home Oxygen Use  Yes         Goals/Expected Outcomes   Short Term Goals  To learn and exhibit compliance with exercise, home and travel O2 prescription;To learn and understand importance of monitoring SPO2 with pulse oximeter and demonstrate accurate use of the pulse oximeter.;To learn and understand importance of maintaining oxygen saturations>88%;To learn and demonstrate proper pursed lip breathing techniques or other breathing techniques.;To learn and demonstrate proper use of respiratory medications       Long  Term Goals  Exhibits compliance with exercise, home and travel O2 prescription;Verbalizes importance of monitoring SPO2 with pulse oximeter and return demonstration;Maintenance of O2 saturations>88%;Exhibits proper breathing techniques, such as pursed lip breathing or other method taught during program session;Compliance with respiratory medication;Demonstrates proper use of MDI's       Comments  Reviewed PLB technique with pt.  Talked about how it work and it's important to maintaining his exercise saturations.         Goals/Expected Outcomes   Short: Become more profiecient at using PLB.   Long: Become independent at using PLB.          Oxygen Discharge (Final Oxygen Re-Evaluation): Oxygen Re-Evaluation - 03/20/18 1025      Program Oxygen Prescription   Program Oxygen Prescription  Continuous;E-Tanks    Liters per minute  2      Home Oxygen   Home Oxygen Device  Home Concentrator;E-Tanks    Sleep Oxygen Prescription  Continuous;BiPAP    Liters per minute  2    Home Exercise Oxygen Prescription  Continuous    Liters per minute  2    Home at Rest Exercise Oxygen Prescription  Continuous    Liters per minute  2    Compliance with Home Oxygen Use  Yes      Goals/Expected Outcomes   Short Term Goals  To learn and exhibit compliance with exercise, home and travel O2 prescription;To learn and understand importance of monitoring SPO2 with pulse oximeter and demonstrate accurate use of the pulse oximeter.;To learn and understand importance of maintaining oxygen saturations>88%;To learn and demonstrate proper pursed lip breathing techniques or other breathing techniques.;To learn and demonstrate proper use of respiratory medications    Long  Term Goals  Exhibits compliance with exercise, home and travel O2 prescription;Verbalizes importance of monitoring SPO2 with pulse oximeter and return demonstration;Maintenance of O2 saturations>88%;Exhibits proper breathing techniques, such as pursed lip breathing or other method taught during program session;Compliance with respiratory medication;Demonstrates proper use of MDI's    Comments  Reviewed PLB technique with pt.  Talked about how it work and it's important to maintaining his exercise saturations.      Goals/Expected Outcomes  Short: Become more profiecient at using PLB.   Long: Become independent at using PLB.       Initial Exercise Prescription: Initial Exercise Prescription - 03/14/18 1300      Date of  Initial Exercise RX and Referring Provider   Date  03/14/18    Referring  Provider  Gollan      Treadmill   MPH  1.5    Grade  0    Minutes  15    METs  2.15      NuStep   Level  2    SPM  80    Minutes  15    METs  2      Biostep-RELP   Level  2    SPM  50    Minutes  15    METs  2      Prescription Details   Frequency (times per week)  3    Duration  Progress to 45 minutes of aerobic exercise without signs/symptoms of physical distress      Intensity   THRR 40-80% of Max Heartrate  114-144    Ratings of Perceived Exertion  11-15    Perceived Dyspnea  0-4      Resistance Training   Training Prescription  Yes    Weight  3 lb    Reps  10-15       Perform Capillary Blood Glucose checks as needed.  Exercise Prescription Changes: Exercise Prescription Changes    Row Name 03/23/18 0800             Response to Exercise   Blood Pressure (Admit)  118/66       Blood Pressure (Exercise)  124/62       Blood Pressure (Exit)  132/64       Heart Rate (Admit)  95 bpm       Heart Rate (Exercise)  123 bpm       Heart Rate (Exit)  91 bpm       Oxygen Saturation (Admit)  92 %       Oxygen Saturation (Exercise)  89 %       Oxygen Saturation (Exit)  95 %       Rating of Perceived Exertion (Exercise)  13       Perceived Dyspnea (Exercise)  2       Symptoms  none       Comments  second day exercise       Duration  Progress to 45 minutes of aerobic exercise without signs/symptoms of physical distress       Intensity  THRR unchanged         Progression   Progression  Continue to progress workloads to maintain intensity without signs/symptoms of physical distress.       Average METs  2.4         Resistance Training   Training Prescription  Yes       Weight  3 lb       Reps  10-15         Treadmill   MPH  2       Grade  0.5       Minutes  15       METs  2.67         NuStep   Level  2       SPM  80       Minutes  15       METs  2.1          Exercise Comments: Exercise Comments    Row Name 03/20/18 1023           Exercise  Comments   First  full day of exercise!  Patient was oriented to gym and equipment including functions, settings, policies, and procedures.  Patient's individual exercise prescription and treatment plan were reviewed.  All starting workloads were established based on the results of the 6 minute walk test done at initial orientation visit.  The plan for exercise progression was also introduced and progression will be customized based on patient's performance and goals.          Exercise Goals and Review: Exercise Goals    Row Name 03/14/18 1325             Exercise Goals   Increase Physical Activity  Yes       Intervention  Provide advice, education, support and counseling about physical activity/exercise needs.;Develop an individualized exercise prescription for aerobic and resistive training based on initial evaluation findings, risk stratification, comorbidities and participant's personal goals.       Expected Outcomes  Short Term: Attend rehab on a regular basis to increase amount of physical activity.;Long Term: Add in home exercise to make exercise part of routine and to increase amount of physical activity.;Long Term: Exercising regularly at least 3-5 days a week.       Increase Strength and Stamina  Yes       Intervention  Provide advice, education, support and counseling about physical activity/exercise needs.;Develop an individualized exercise prescription for aerobic and resistive training based on initial evaluation findings, risk stratification, comorbidities and participant's personal goals.       Expected Outcomes  Short Term: Increase workloads from initial exercise prescription for resistance, speed, and METs.;Short Term: Perform resistance training exercises routinely during rehab and add in resistance training at home;Long Term: Improve cardiorespiratory fitness, muscular endurance and strength as measured by increased METs and functional capacity (6MWT)       Able to understand  and use rate of perceived exertion (RPE) scale  Yes       Intervention  Provide education and explanation on how to use RPE scale       Expected Outcomes  Short Term: Able to use RPE daily in rehab to express subjective intensity level;Long Term:  Able to use RPE to guide intensity level when exercising independently       Able to understand and use Dyspnea scale  Yes       Intervention  Provide education and explanation on how to use Dyspnea scale       Expected Outcomes  Short Term: Able to use Dyspnea scale daily in rehab to express subjective sense of shortness of breath during exertion;Long Term: Able to use Dyspnea scale to guide intensity level when exercising independently       Knowledge and understanding of Target Heart Rate Range (THRR)  Yes       Intervention  Provide education and explanation of THRR including how the numbers were predicted and where they are located for reference       Expected Outcomes  Short Term: Able to state/look up THRR;Short Term: Able to use daily as guideline for intensity in rehab;Long Term: Able to use THRR to govern intensity when exercising independently       Able to check pulse independently  Yes       Intervention  Provide education and demonstration on how to check pulse in carotid and radial arteries.;Review the importance of being able to check your own pulse for safety during independent exercise       Expected Outcomes  Short Term: Able to  explain why pulse checking is important during independent exercise;Long Term: Able to check pulse independently and accurately       Understanding of Exercise Prescription  Yes       Intervention  Provide education, explanation, and written materials on patient's individual exercise prescription       Expected Outcomes  Short Term: Able to explain program exercise prescription;Long Term: Able to explain home exercise prescription to exercise independently          Exercise Goals Re-Evaluation : Exercise Goals  Re-Evaluation    Row Name 03/20/18 1023 03/23/18 0846           Exercise Goal Re-Evaluation   Exercise Goals Review  Increase Physical Activity;Increase Strength and Stamina;Able to understand and use rate of perceived exertion (RPE) scale;Knowledge and understanding of Target Heart Rate Range (THRR);Understanding of Exercise Prescription;Able to understand and use Dyspnea scale  Increase Physical Activity;Increase Strength and Stamina;Able to understand and use rate of perceived exertion (RPE) scale;Able to understand and use Dyspnea scale;Knowledge and understanding of Target Heart Rate Range (THRR)      Comments  Reviewed RPE scale, THR and program prescription with pt today.  Pt voiced understanding and was given a copy of goals to take home.   Lilliane has tolerated exercise well and increased speed and grade on TM.  Staff will monitor progress.      Expected Outcomes  Short: Use RPE daily to regulate intensity. Long: Follow program prescription in THR.  Short - attend LW consistently Long - increase MET level         Discharge Exercise Prescription (Final Exercise Prescription Changes): Exercise Prescription Changes - 03/23/18 0800      Response to Exercise   Blood Pressure (Admit)  118/66    Blood Pressure (Exercise)  124/62    Blood Pressure (Exit)  132/64    Heart Rate (Admit)  95 bpm    Heart Rate (Exercise)  123 bpm    Heart Rate (Exit)  91 bpm    Oxygen Saturation (Admit)  92 %    Oxygen Saturation (Exercise)  89 %    Oxygen Saturation (Exit)  95 %    Rating of Perceived Exertion (Exercise)  13    Perceived Dyspnea (Exercise)  2    Symptoms  none    Comments  second day exercise    Duration  Progress to 45 minutes of aerobic exercise without signs/symptoms of physical distress    Intensity  THRR unchanged      Progression   Progression  Continue to progress workloads to maintain intensity without signs/symptoms of physical distress.    Average METs  2.4      Resistance  Training   Training Prescription  Yes    Weight  3 lb    Reps  10-15      Treadmill   MPH  2    Grade  0.5    Minutes  15    METs  2.67      NuStep   Level  2    SPM  80    Minutes  15    METs  2.1       Nutrition:  Target Goals: Understanding of nutrition guidelines, daily intake of sodium <1525m, cholesterol <2099m calories 30% from fat and 7% or less from saturated fats, daily to have 5 or more servings of fruits and vegetables.  Biometrics: Pre Biometrics - 03/14/18 1323      Pre Biometrics  Height  5' 3.25" (1.607 m)    Weight  206 lb 12.8 oz (93.8 kg)    Waist Circumference  40.5 inches    Hip Circumference  49 inches    Waist to Hip Ratio  0.83 %    BMI (Calculated)  36.32    Single Leg Stand  2.5 seconds        Nutrition Therapy Plan and Nutrition Goals: Nutrition Therapy & Goals - 03/14/18 1153      Personal Nutrition Goals   Nutrition Goal  weight loss    Personal Goal #2  Meet with the dietician      Intervention Plan   Intervention  Prescribe, educate and counsel regarding individualized specific dietary modifications aiming towards targeted core components such as weight, hypertension, lipid management, diabetes, heart failure and other comorbidities.;Nutrition handout(s) given to patient.    Expected Outcomes  Short Term Goal: Understand basic principles of dietary content, such as calories, fat, sodium, cholesterol and nutrients.;Long Term Goal: Adherence to prescribed nutrition plan.       Nutrition Assessments: Nutrition Assessments - 03/14/18 1225      MEDFICTS Scores   Pre Score  6       Nutrition Goals Re-Evaluation:   Nutrition Goals Discharge (Final Nutrition Goals Re-Evaluation):   Psychosocial: Target Goals: Acknowledge presence or absence of significant depression and/or stress, maximize coping skills, provide positive support system. Participant is able to verbalize types and ability to use techniques and skills needed for  reducing stress and depression.   Initial Review & Psychosocial Screening: Initial Psych Review & Screening - 03/14/18 1151      Initial Review   Current issues with  History of Depression;Current Stress Concerns    Source of Stress Concerns  Chronic Illness    Comments  Her illness was making her depressed and has since brought herself out of it. She thought of her mom and it made her want to get better.       Family Dynamics   Good Support System?  Yes    Comments  She can look to her husband and her kids.      Barriers   Psychosocial barriers to participate in program  The patient should benefit from training in stress management and relaxation.      Screening Interventions   Interventions  Program counselor consult;Encouraged to exercise;To provide support and resources with identified psychosocial needs;Provide feedback about the scores to participant    Expected Outcomes  Short Term goal: Utilizing psychosocial counselor, staff and physician to assist with identification of specific Stressors or current issues interfering with healing process. Setting desired goal for each stressor or current issue identified.;Long Term Goal: Stressors or current issues are controlled or eliminated.;Short Term goal: Identification and review with participant of any Quality of Life or Depression concerns found by scoring the questionnaire.;Long Term goal: The participant improves quality of Life and PHQ9 Scores as seen by post scores and/or verbalization of changes       Quality of Life Scores:  Scores of 19 and below usually indicate a poorer quality of life in these areas.  A difference of  2-3 points is a clinically meaningful difference.  A difference of 2-3 points in the total score of the Quality of Life Index has been associated with significant improvement in overall quality of life, self-image, physical symptoms, and general health in studies assessing change in quality of  life.  PHQ-9: Recent Review Flowsheet Data    Depression  screen [facility] 2/9 03/20/2018 03/14/2018 12/12/2017   Decreased Interest 2 2 0   Down, Depressed, Hopeless 2 1 0   PHQ - 2 Score 4 3 0   Altered sleeping 2 3 -   Tired, decreased energy 3 3 -   Change in appetite 2 2 -   Feeling bad or failure about yourself  0 1 -   Trouble concentrating 1 1 -   Moving slowly or fidgety/restless 0 0 -   Suicidal thoughts 0 0 -   PHQ-9 Score 12 13 -   Difficult doing work/chores Somewhat difficult Somewhat difficult -     Interpretation of Total Score  Total Score Depression Severity:  1-4 = Minimal depression, 5-9 = Mild depression, 10-14 = Moderate depression, 15-19 = Moderately severe depression, 20-27 = Severe depression   Psychosocial Evaluation and Intervention: Psychosocial Evaluation - 03/20/18 1153      Psychosocial Evaluation & Interventions   Interventions  Stress management education;Encouraged to exercise with the program and follow exercise prescription    Comments  Counselor met with Ms. Ivan Croft Dewaine Oats) today for initial psychosocial evaluation.  She is a 63 year old who suffers with both COPD and Congestive Heart Failure.  Shadaya has a strong support system with a spouse of 24 years and (4) adult children - (3) who live locally.  She is also actively involved in her local church.  Rion also is challenged with other health issues such as diabetes, HBP and high cholesterol.  She reports sleeping better recently (after training a new puppy to be quiet at night) and her appetite is adjusting to a new diet that she is on - and is being effective with some weight loss currently reported.  Billiejo admits to a history of depression following the death of her mother approximately 3 years ago.  She has recovered and reports currently being in a positive mood most of the time with minimal stress other than her health conditions.  She has goals to lose weight; eat healthier; and stabilize her  blood sugars while in this program.  Staff will be following with Dewaine Oats throughout the course of this program.     Expected Outcomes  Short:  Kerri will meet with the dietician to address her dietary and weight loss goals.  She will exercise consistently to help her health and mental health - stress management with all of her medical conditions.  Long:  Ellamay will develop healthy lifestyle habits of diet and exercise for her health and mental health.    Continue Psychosocial Services   Follow up required by staff       Psychosocial Re-Evaluation:   Psychosocial Discharge (Final Psychosocial Re-Evaluation):   Education: Education Goals: Education classes will be provided on a weekly basis, covering required topics. Participant will state understanding/return demonstration of topics presented.  Learning Barriers/Preferences: Learning Barriers/Preferences - 03/14/18 1155      Learning Barriers/Preferences   Learning Barriers  Sight   wears glasses   Learning Preferences  None       Education Topics:  Initial Evaluation Education: - Verbal, written and demonstration of respiratory meds, oximetry and breathing techniques. Instruction on use of nebulizers and MDIs and importance of monitoring MDI activations.   Pulmonary Rehab from 03/31/2018 in [facility] Cardiac and Pulmonary Rehab  Date  03/14/18  Educator  [facility]  Instruction Review Code  1- Verbalizes Understanding      General Nutrition Guidelines/Fats and Fiber: -Group instruction provided by verbal, written material, models  and posters to present the general guidelines for heart healthy nutrition. Gives an explanation and review of dietary fats and fiber.   Controlling Sodium/Reading Food Labels: -Group verbal and written material supporting the discussion of sodium use in heart healthy nutrition. Review and explanation with models, verbal and written materials for utilization of the food label.   Exercise Physiology & General  Exercise Guidelines: - Group verbal and written instruction with models to review the exercise physiology of the cardiovascular system and associated critical values. Provides general exercise guidelines with specific guidelines to those with heart or lung disease.    Aerobic Exercise & Resistance Training: - Gives group verbal and written instruction on the various components of exercise. Focuses on aerobic and resistive training programs and the benefits of this training and how to safely progress through these programs.   Flexibility, Balance, Mind/Body Relaxation: Provides group verbal/written instruction on the benefits of flexibility and balance training, including mind/body exercise modes such as yoga, pilates and tai chi.  Demonstration and skill practice provided.   Pulmonary Rehab from 03/31/2018 in [facility] Cardiac and Pulmonary Rehab  Date  03/22/18  Educator  [facility]  Instruction Review Code  1- Verbalizes Understanding      Stress and Anxiety: - Provides group verbal and written instruction about the health risks of elevated stress and causes of high stress.  Discuss the correlation between heart/lung disease and anxiety and treatment options. Review healthy ways to manage with stress and anxiety.   Pulmonary Rehab from 03/31/2018 in [facility] Cardiac and Pulmonary Rehab  Date  03/29/18  Educator  [facility]  Instruction Review Code  1- Verbalizes Understanding      Depression: - Provides group verbal and written instruction on the correlation between heart/lung disease and depressed mood, treatment options, and the stigmas associated with seeking treatment.   Exercise & Equipment Safety: - Individual verbal instruction and demonstration of equipment use and safety with use of the equipment.   Pulmonary Rehab from 03/31/2018 in [facility] Cardiac and Pulmonary Rehab  Date  03/14/18  Educator  [facility]  Instruction Review Code  1- Verbalizes Understanding      Infection Prevention: - Provides verbal and  written material to individual with discussion of infection control including proper hand washing and proper equipment cleaning during exercise session.   Pulmonary Rehab from 03/31/2018 in [facility] Cardiac and Pulmonary Rehab  Date  03/14/18  Educator  [facility]  Instruction Review Code  1- Verbalizes Understanding      Falls Prevention: - Provides verbal and written material to individual with discussion of falls prevention and safety.   Pulmonary Rehab from 03/31/2018 in [facility] Cardiac and Pulmonary Rehab  Date  03/14/18  Educator  [facility]  Instruction Review Code  1- Verbalizes Understanding      Diabetes: - Individual verbal and written instruction to review signs/symptoms of diabetes, desired ranges of glucose level fasting, after meals and with exercise. Advice that pre and post exercise glucose checks will be done for 3 sessions at entry of program.   Pulmonary Rehab from 03/31/2018 in [facility] Cardiac and Pulmonary Rehab  Date  03/14/18  Educator  [facility]  Instruction Review Code  1- Verbalizes Understanding      Chronic Lung Diseases: - Group verbal and written instruction to review updates, respiratory medications, advancements in procedures and treatments. Discuss use of supplemental oxygen including available portable oxygen systems, continuous and intermittent flow rates, concentrators, personal use and safety guidelines. Review proper use of inhaler and spacers. Provide  informative websites for self-education.    Pulmonary Rehab from 03/31/2018 in [facility] Cardiac and Pulmonary Rehab  Date  03/31/18  Educator  [facility]  Instruction Review Code  1- Verbalizes Understanding      Energy Conservation: - Provide group verbal and written instruction for methods to conserve energy, plan and organize activities. Instruct on pacing techniques, use of adaptive equipment and posture/positioning to relieve shortness of breath.   Triggers and Exacerbations: - Group verbal and written instruction to review types of  environmental triggers and ways to prevent exacerbations. Discuss weather changes, air quality and the benefits of nasal washing. Review warning signs and symptoms to help prevent infections. Discuss techniques for effective airway clearance, coughing, and vibrations.   AED/CPR: - Group verbal and written instruction with the use of models to demonstrate the basic use of the AED with the basic ABC's of resuscitation.   Anatomy and Physiology of the Lungs: - Group verbal and written instruction with the use of models to provide basic lung anatomy and physiology related to function, structure and complications of lung disease.   Anatomy & Physiology of the Heart: - Group verbal and written instruction and models provide basic cardiac anatomy and physiology, with the coronary electrical and arterial systems. Review of Valvular disease and Heart Failure   Cardiac Medications: - Group verbal and written instruction to review commonly prescribed medications for heart disease. Reviews the medication, class of the drug, and side effects.   Know Your Numbers and Risk Factors: -Group verbal and written instruction about important numbers in your health.  Discussion of what are risk factors and how they play a role in the disease process.  Review of Cholesterol, Blood Pressure, Diabetes, and BMI and the role they play in your overall health.   Sleep Hygiene: -Provides group verbal and written instruction about how sleep can affect your health.  Define sleep hygiene, discuss sleep cycles and impact of sleep habits. Review good sleep hygiene tips.    Other: -Provides group and verbal instruction on various topics (see comments)    Knowledge Questionnaire Score: Knowledge Questionnaire Score - 03/14/18 1155      Knowledge Questionnaire Score   Pre Score  15/18   reviewed with patient       Core Components/Risk Factors/Patient Goals at Admission: Personal Goals and Risk Factors at  Admission - 03/14/18 1156      Core Components/Risk Factors/Patient Goals on Admission    Weight Management  Yes;Weight Loss;Obesity    Intervention  Weight Management: Develop a combined nutrition and exercise program designed to reach desired caloric intake, while maintaining appropriate intake of nutrient and fiber, sodium and fats, and appropriate energy expenditure required for the weight goal.;Weight Management: Provide education and appropriate resources to help participant work on and attain dietary goals.;Weight Management/Obesity: Establish reasonable short term and long term weight goals.    Admit Weight  206 lb 12.8 oz (93.8 kg)    Goal Weight: Short Term  201 lb (91.2 kg)    Goal Weight: Long Term  160 lb (72.6 kg)    Expected Outcomes  Short Term: Continue to assess and modify interventions until short term weight is achieved;Long Term: Adherence to nutrition and physical activity/exercise program aimed toward attainment of established weight goal;Weight Maintenance: Understanding of the daily nutrition guidelines, which includes 25-35% calories from fat, 7% or less cal from saturated fats, less than 21m cholesterol, less than 1.5gm of sodium, & 5 or more servings of fruits and vegetables daily;Weight  Loss: Understanding of general recommendations for a balanced deficit meal plan, which promotes 1-2 lb weight loss per week and includes a negative energy balance of [phone] kcal/d    Improve shortness of breath with ADL's  Yes    Intervention  Provide education, individualized exercise plan and daily activity instruction to help decrease symptoms of SOB with activities of daily living.    Expected Outcomes  Short Term: Improve cardiorespiratory fitness to achieve a reduction of symptoms when performing ADLs;Long Term: Be able to perform more ADLs without symptoms or delay the onset of symptoms    Diabetes  Yes    Intervention  Provide education about signs/symptoms and action to take for  hypo/hyperglycemia.;Provide education about proper nutrition, including hydration, and aerobic/resistive exercise prescription along with prescribed medications to achieve blood glucose in normal ranges: Fasting glucose 65-99 mg/dL    Expected Outcomes  Short Term: Participant verbalizes understanding of the signs/symptoms and immediate care of hyper/hypoglycemia, proper foot care and importance of medication, aerobic/resistive exercise and nutrition plan for blood glucose control.;Long Term: Attainment of HbA1C < 7%.    Heart Failure  Yes    Intervention  Provide a combined exercise and nutrition program that is supplemented with education, support and counseling about heart failure. Directed toward relieving symptoms such as shortness of breath, decreased exercise tolerance, and extremity edema.    Expected Outcomes  Improve functional capacity of life;Short term: Attendance in program 2-3 days a week with increased exercise capacity. Reported lower sodium intake. Reported increased fruit and vegetable intake. Reports medication compliance.;Short term: Daily weights obtained and reported for increase. Utilizing diuretic protocols set by physician.;Long term: Adoption of self-care skills and reduction of barriers for early signs and symptoms recognition and intervention leading to self-care maintenance.    Hypertension  Yes    Intervention  Provide education on lifestyle modifcations including regular physical activity/exercise, weight management, moderate sodium restriction and increased consumption of fresh fruit, vegetables, and low fat dairy, alcohol moderation, and smoking cessation.;Monitor prescription use compliance.    Expected Outcomes  Short Term: Continued assessment and intervention until BP is < 140/6m HG in hypertensive participants. < 130/870mHG in hypertensive participants with diabetes, heart failure or chronic kidney disease.;Long Term: Maintenance of blood pressure at goal levels.     Lipids  Yes    Intervention  Provide education and support for participant on nutrition & aerobic/resistive exercise along with prescribed medications to achieve LDL <7049mHDL >91m19m  Expected Outcomes  Short Term: Participant states understanding of desired cholesterol values and is compliant with medications prescribed. Participant is following exercise prescription and nutrition guidelines.;Long Term: Cholesterol controlled with medications as prescribed, with individualized exercise RX and with personalized nutrition plan. Value goals: LDL < 70mg29mL > 40 mg.       Core Components/Risk Factors/Patient Goals Review:    Core Components/Risk Factors/Patient Goals at Discharge (Final Review):    ITP Comments: ITP Comments    Row Name 03/14/18 1220 04/03/18 0823         ITP Comments  Medical Evaluation completed. Chart sent for review and changes to Dr. Mark Emily Filbertctor of [facility] Number Onegnosis can be found in [facility] encounter 02/13/2018  30 day review completed. ITP sent to Dr. Mark Emily Filbertctor of [facility] with ITP unless changes are made by physician.         Comments: 30 day review

## 2018-04-05 ENCOUNTER — Encounter: Payer: Medicare HMO | Admitting: *Deleted

## 2018-04-05 DIAGNOSIS — I5042 Chronic combined systolic (congestive) and diastolic (congestive) heart failure: Secondary | ICD-10-CM

## 2018-04-05 DIAGNOSIS — I110 Hypertensive heart disease with heart failure: Secondary | ICD-10-CM | POA: Diagnosis not present

## 2018-04-05 NOTE — Progress Notes (Signed)
Daily Session Note  Patient Details  Name: Brenda Wu MRN: [id number] Date of Birth: 28-Nov-1955 Referring Provider:     Pulmonary Rehab from 03/14/2018 in [facility] Cardiac and Pulmonary Rehab  Referring Provider  Gollan      Encounter Date: 04/05/2018  Check In: Session Check In - 04/05/18 1011      Check-In   Supervising physician immediately available to respond to emergencies  [facility] immediately available ER MD    Physician(s)  Dr. Kerman Passey and Mariea Clonts    Location  [facility]-Cardiac & Pulmonary Rehab    Staff Present  Renita Papa, RN BSN;Jessica Luan Pulling, MA, RCEP, CCRP, Exercise Physiologist;Joseph Tessie Fass RCP,RRT,BSRT    Medication changes reported      No    Fall or balance concerns reported     No    Tobacco Cessation  No Change    Warm-up and Cool-down  Performed as group-led instruction    Resistance Training Performed  Yes    VAD Patient?  No    PAD/SET Patient?  No      Pain Assessment   Currently in Pain?  No/denies          Social History   Tobacco Use  Smoking Status Former Smoker  . Packs/day: 1.00  . Years: 30.00  . Pack years: 30.00  . Types: Cigarettes  . Last attempt to quit: 02/22/2002  . Years since quitting: 16.1  Smokeless Tobacco Never Used    Goals Met:  Proper associated with RPD/PD & O2 Sat Independence with exercise equipment Exercise tolerated well No report of cardiac concerns or symptoms Strength training completed today  Goals Unmet:  Not Applicable  Comments: Pt able to follow exercise prescription today without complaint.  Will continue to monitor for progression. Reviewed home exercise with pt today.  Pt plans to walk and use elliptical and bike at home for exercise.  Reviewed THR, pulse, RPE, sign and symptoms, and when to call 911 or MD.  Also discussed weather considerations and indoor options.  Pt voiced understanding.   Dr. Emily Filbert is Medical Director for [place] and [facility].

## 2018-04-07 ENCOUNTER — Encounter: Payer: Medicare HMO | Admitting: *Deleted

## 2018-04-07 DIAGNOSIS — I5042 Chronic combined systolic (congestive) and diastolic (congestive) heart failure: Secondary | ICD-10-CM

## 2018-04-07 DIAGNOSIS — I110 Hypertensive heart disease with heart failure: Secondary | ICD-10-CM | POA: Diagnosis not present

## 2018-04-07 NOTE — Progress Notes (Signed)
Daily Session Note  Patient Details  Name: Brenda Wu MRN: [id number] Date of Birth: 20-Sep-1955 Referring Provider:     Pulmonary Rehab from 03/14/2018 in [facility] Cardiac and Pulmonary Rehab  Referring Provider  Gollan      Encounter Date: 04/07/2018  Check In: Session Check In - 04/07/18 1014      Check-In   Supervising physician immediately available to respond to emergencies  [facility] immediately available ER MD    Physician(s)  Dr. Cherylann Banas and Clearnce Hasten    Location  [facility]-Cardiac & Pulmonary Rehab    Staff Present  Renita Papa, RN BSN;Jeanna Durrell BS, Exercise Physiologist;Joseph Tessie Fass RCP,RRT,BSRT    Medication changes reported      No    Fall or balance concerns reported     No    Tobacco Cessation  No Change    Warm-up and Cool-down  Performed as group-led instruction    Resistance Training Performed  Yes    VAD Patient?  No    PAD/SET Patient?  No      Pain Assessment   Currently in Pain?  No/denies          Social History   Tobacco Use  Smoking Status Former Smoker  . Packs/day: 1.00  . Years: 30.00  . Pack years: 30.00  . Types: Cigarettes  . Last attempt to quit: 02/22/2002  . Years since quitting: 16.1  Smokeless Tobacco Never Used    Goals Met:  Proper associated with RPD/PD & O2 Sat Independence with exercise equipment Exercise tolerated well No report of cardiac concerns or symptoms Strength training completed today  Goals Unmet:  Not Applicable  Comments: Pt able to follow exercise prescription today without complaint.  Will continue to monitor for progression.    Dr. Emily Filbert is Medical Director for [place] and [facility].

## 2018-04-10 ENCOUNTER — Encounter: Payer: Medicare HMO | Admitting: *Deleted

## 2018-04-10 DIAGNOSIS — I110 Hypertensive heart disease with heart failure: Secondary | ICD-10-CM | POA: Diagnosis not present

## 2018-04-10 DIAGNOSIS — I5042 Chronic combined systolic (congestive) and diastolic (congestive) heart failure: Secondary | ICD-10-CM

## 2018-04-10 NOTE — Progress Notes (Signed)
Daily Session Note  Patient Details  Name: Brenda Wu MRN: [id number] Date of Birth: 11-Dec-1955 Referring Provider:     Pulmonary Rehab from 03/14/2018 in [facility] Cardiac and Pulmonary Rehab  Referring Provider  Gollan      Encounter Date: 04/10/2018  Check In: Session Check In - 04/10/18 1023      Check-In   Supervising physician immediately available to respond to emergencies  [facility] immediately available ER MD    Physician(s)  Dr. Cinda Quest and Dr. Alfred Levins    Location  [facility]-Cardiac & Pulmonary Rehab    Staff Present  Earlean Shawl, BS, ACSM CEP, Exercise Physiologist;Amanda Oletta Darter, BA, ACSM CEP, Exercise Physiologist;Joseph Tessie Fass RCP,RRT,BSRT    Medication changes reported      No    Fall or balance concerns reported     No    Tobacco Cessation  No Change    Warm-up and Cool-down  Performed as group-led instruction    Resistance Training Performed  Yes    VAD Patient?  No    PAD/SET Patient?  No      Pain Assessment   Currently in Pain?  No/denies    Multiple Pain Sites  No          Social History   Tobacco Use  Smoking Status Former Smoker  . Packs/day: 1.00  . Years: 30.00  . Pack years: 30.00  . Types: Cigarettes  . Last attempt to quit: 02/22/2002  . Years since quitting: 16.1  Smokeless Tobacco Never Used    Goals Met:  Proper associated with RPD/PD & O2 Sat Independence with exercise equipment Exercise tolerated well No report of cardiac concerns or symptoms Strength training completed today  Goals Unmet:  Not Applicable  Comments: Pt able to follow exercise prescription today without complaint.  Will continue to monitor for progression.    Dr. Emily Filbert is Medical Director for [place] and [facility].

## 2018-04-12 DIAGNOSIS — I110 Hypertensive heart disease with heart failure: Secondary | ICD-10-CM | POA: Diagnosis not present

## 2018-04-12 DIAGNOSIS — I5042 Chronic combined systolic (congestive) and diastolic (congestive) heart failure: Secondary | ICD-10-CM

## 2018-04-12 NOTE — Progress Notes (Signed)
Daily Session Note  Patient Details  Name: Brenda Wu MRN: [id number] Date of Birth: 05/17/1955 Referring Provider:     Pulmonary Rehab from 03/14/2018 in [facility] Cardiac and Pulmonary Rehab  Referring Provider  Gollan      Encounter Date: 04/12/2018  Check In: Session Check In - 04/12/18 1018      Check-In   Supervising physician immediately available to respond to emergencies  [facility] immediately available ER MD    Physician(s)  [place] and Veronese    Location  [facility]-Cardiac & Pulmonary Rehab    Staff Present  Jessica Hawkins, MA, RCEP, CCRP, Exercise Physiologist;Joseph Hood RCP,RRT,BSRT;Amanda Sommer, BA, ACSM CEP, Exercise Physiologist    Medication changes reported      No    Fall or balance concerns reported     No    Warm-up and Cool-down  Performed as group-led instruction    Resistance Training Performed  Yes    VAD Patient?  No    PAD/SET Patient?  No      Pain Assessment   Currently in Pain?  No/denies    Multiple Pain Sites  No          Social History   Tobacco Use  Smoking Status Former Smoker  . Packs/day: 1.00  . Years: 30.00  . Pack years: 30.00  . Types: Cigarettes  . Last attempt to quit: 02/22/2002  . Years since quitting: 16.1  Smokeless Tobacco Never Used    Goals Met:  Proper associated with RPD/PD & O2 Sat Independence with exercise equipment Exercise tolerated well Strength training completed today  Goals Unmet:  Not Applicable  Comments: Pt able to follow exercise prescription today without complaint.  Will continue to monitor for progression.    Dr. Mark Miller is Medical Director for [facility] and [facility]. 

## 2018-04-23 ENCOUNTER — Other Ambulatory Visit: Payer: Self-pay

## 2018-04-23 ENCOUNTER — Ambulatory Visit (INDEPENDENT_AMBULATORY_CARE_PROVIDER_SITE_OTHER): Payer: Medicare HMO

## 2018-04-23 ENCOUNTER — Ambulatory Visit
Admission: EM | Admit: 2018-04-23 | Discharge: 2018-04-23 | Disposition: A | Discharge status: Home / Self Care | Payer: Medicare HMO | Attending: Family Medicine | Admitting: Family Medicine

## 2018-04-23 ENCOUNTER — Encounter: Payer: Self-pay | Admitting: Emergency Medicine

## 2018-04-23 DIAGNOSIS — A084 Viral intestinal infection, unspecified: Secondary | ICD-10-CM | POA: Diagnosis not present

## 2018-04-23 MED ORDER — ONDANSETRON 8 MG PO TBDP: 8.0000 mg | ORAL_TABLET | Freq: Three times a day (TID) | ORAL | 0 refills | Status: DC | PRN

## 2018-04-23 MED ORDER — ONDANSETRON 8 MG PO TBDP
8.0000 mg | ORAL_TABLET | Freq: Once | ORAL | Status: AC
  Administered 2018-04-23: 8 mg via ORAL

## 2018-04-23 NOTE — ED Triage Notes (Signed)
Patient c/o nausea and diarrhea that started 4 days ago.  Patient denies fevers.

## 2018-04-23 NOTE — ED Provider Notes (Signed)
[facility]    CSN: [id number] Arrival date & time: 04/23/18  1221     History   Chief Complaint Chief Complaint  Patient presents with  . Nausea  . Diarrhea    HPI Ura Yingling is a 63 y.o. female.   63 yo female with a c/o nausea, vomiting (x 2) and diarrhea for the last 3-4 days. Denies any fevers, chills, melena, hematochezia, suspicious foods, known sick contacts.   The history is provided by the patient.  Diarrhea    Past Medical History:  Diagnosis Date  . Chronic combined systolic (congestive) and diastolic (congestive) heart failure (HCC)    a. 10/2017 Echo: EF 45-50%, diff HK. Mild to mod MR, mildly dil LA. Mod dil RV w/ mildly reduced RV fxn.  [name] COPD (chronic obstructive pulmonary disease) (HCC)   . Depression   . Diabetes mellitus without complication (HCC)   . Hypercholesteremia   . Hypertension   . Morbid obesity (HCC)   . NICM (nonischemic cardiomyopathy) (HCC)   . NSTEMI (non-ST elevated myocardial infarction) (HCC)    a. 12/2017 -->Cath: nl cors. EF 45-50%.  . Seizures (HCC)   . Stroke (HCC)   . Tricuspid regurgitation    a. 10/2017 Echo: Mild to mod TR.    Patient Active Problem List   Diagnosis Date Noted  . NICM (nonischemic cardiomyopathy) (HCC) 02/13/2018  . Non-ST elevation (NSTEMI) myocardial infarction (HCC)   . Chronic combined systolic and diastolic heart failure (HCC) 12/12/2017  . HTN (hypertension) 12/12/2017  . Diabetes (HCC) 12/12/2017  . COPD exacerbation (HCC) 02/07/2017  . History of cerebellar stroke 03/12/2016  . History of seizures 03/12/2016  . Pulmonary nodule, left 01/05/2016  . Paroxysmal atrial fibrillation (HCC) 10/17/2015  . Cerebellar stroke (HCC) 06/26/2015  . Transient diplopia 06/09/2015  . Asthma-chronic obstructive pulmonary disease overlap syndrome (HCC) 05/30/2015  . COPD with asthma (HCC) 05/30/2015  . Hypoxemia 05/30/2015  . Obstructive sleep apnea syndrome 05/30/2015  . OSA on CPAP  05/30/2015  . Obesity (BMI 30-39.9) 03/12/2015  . Bilateral hearing loss 01/22/2015  . Generalized seizure disorder (HCC) 01/22/2015  . Hyperlipidemia associated with type 2 diabetes mellitus (HCC) 01/22/2015  . Hypertension associated with diabetes (HCC) 01/22/2015  . Morbid obesity due to excess calories (HCC) 01/22/2015  . Primary osteoarthritis involving multiple joints 01/22/2015  . Severe episode of recurrent major depressive disorder, without psychotic features (HCC) 01/22/2015    Past Surgical History:  Procedure Laterality Date  . ABDOMINAL HYSTERECTOMY    . LEFT HEART CATH AND CORONARY ANGIOGRAPHY N/A 01/09/2018   Procedure: LEFT HEART CATH AND CORONARY ANGIOGRAPHY;  Surgeon: Iran Ouch, MD;  Location: [facility] INVASIVE CV LAB;  Service: Cardiovascular;  Laterality: N/A;  . REPLACEMENT TOTAL KNEE      OB History   No obstetric history on file.      Home Medications    Prior to Admission medications   Medication Sig Start Date End Date Taking? Authorizing Provider  albuterol (PROVENTIL HFA;VENTOLIN HFA) 108 (90 Base) MCG/ACT inhaler Inhale 2 puffs into the lungs 4 (four) times daily as needed for wheezing or shortness of breath.  01/05/16  Yes [provider]  albuterol (PROVENTIL) (2.5 MG/3ML) 0.083% nebulizer solution Inhale 2.5 mg into the lungs every 6 (six) hours as needed for wheezing or shortness of breath.  10/01/16  Yes [provider]  aspirin EC 81 MG tablet Take 1 tablet (81 mg total) by mouth daily. 02/13/18  Yes Julien Nordmann  J, MD  atorvastatin (LIPITOR) 40 MG tablet Take 40 mg by mouth every evening.    Yes [provider]  budesonide-formoterol (SYMBICORT) 160-4.5 MCG/ACT inhaler Inhale 2 puffs into the lungs 2 (two) times daily. 05/22/15  Yes [provider]  diltiazem (CARDIZEM CD) 180 MG 24 hr capsule Take 180 mg by mouth daily.   Yes [provider]  ferrous sulfate 325 (65 FE) MG tablet Take 325 mg by  mouth daily with breakfast.   Yes [provider]  glipiZIDE (GLUCOTROL) 10 MG tablet Take 10 mg by mouth 2 (two) times daily before a meal.    Yes [provider]  guaiFENesin (MUCINEX) 600 MG 12 hr tablet Take 600 mg by mouth 2 (two) times daily as needed.    Yes [provider]  Insulin Glargine (LANTUS SOLOSTAR) 100 UNIT/ML Solostar Pen Inject 20-25 Units into the skin daily.  01/21/17  Yes [provider]  lamoTRIgine (LAMICTAL) 200 MG tablet Take 200 mg by mouth 2 (two) times daily.    Yes [provider]  losartan (COZAAR) 50 MG tablet Take 50 mg by mouth daily.   Yes [provider]  Multiple Vitamin (MULTI-VITAMINS) TABS Take 1 tablet by mouth daily.   Yes [provider]  niacin 500 MG tablet Take 500 mg by mouth daily.   Yes [provider]  potassium chloride SA (K-DUR,KLOR-CON) 20 MEQ tablet Take 1 tablet (20 mEq total) by mouth daily. 02/12/17  Yes [name], MD  pregabalin (LYRICA) 75 MG capsule Take 75 mg by mouth 2 (two) times daily.   Yes [provider]  sitaGLIPtin (JANUVIA) 50 MG tablet Take 50 mg by mouth daily.   Yes [provider]  tiotropium (SPIRIVA) 18 MCG inhalation capsule Place 18 mcg into inhaler and inhale daily.   Yes [provider]  topiramate (TOPAMAX) 200 MG tablet Take 200 mg by mouth 2 (two) times daily.   Yes [provider]  torsemide (DEMADEX) 20 MG tablet Take 2 tablets (40 mg total) by mouth daily. 01/26/18  Yes Creig Hines, NP  vitamin B-12 (CYANOCOBALAMIN) 500 MCG tablet Take 500 mcg by mouth daily.   Yes [provider]  ondansetron (ZOFRAN ODT) 8 MG disintegrating tablet Take 1 tablet (8 mg total) by mouth every 8 (eight) hours as needed. 04/23/18   Payton Mccallum, MD  senna (SENOKOT) 8.6 MG TABS tablet Take 1 tablet by mouth daily.     [provider]    Family History Family History  Problem Relation  Age of Onset  . Hypertension Father   . Asthma Sister   . Heart murmur Sister   . Diabetes Sister     Social History Social History   Tobacco Use  . Smoking status: Former Smoker    Packs/day: 1.00    Years: 30.00    Pack years: 30.00    Types: Cigarettes    Last attempt to quit: 02/22/2002    Years since quitting: 16.1  . Smokeless tobacco: Never Used  Substance Use Topics  . Alcohol use: No  . Drug use: No     Allergies   Metformin and Bismuth subsalicylate   Review of Systems Review of Systems  Gastrointestinal: Positive for diarrhea.     Physical Exam Triage Vital Signs ED Triage Vitals  Enc Vitals Group     BP 04/23/18 1303 109/73     Pulse Rate 04/23/18 1303 86     Resp 04/23/18  1303 16     Temp 04/23/18 1303 98 F (36.7 C)     Temp Source 04/23/18 1303 Oral     SpO2 04/23/18 1303 95 %     Weight 04/23/18 1257 198 lb (89.8 kg)     Height 04/23/18 1257 5\' 3"  (1.6 m)     Head Circumference --      Peak Flow --      Pain Score 04/23/18 1257 8     Pain Loc --      Pain Edu? --      Excl. in GC? --    No data found.  Updated Vital Signs BP 109/73 (BP Location: Left Arm)   Pulse 86   Temp 98 F (36.7 C) (Oral)   Resp 16   Ht 5\' 3"  (1.6 m)   Wt 89.8 kg   SpO2 95%   BMI 35.07 kg/m   Visual Acuity Right Eye Distance:   Left Eye Distance:   Bilateral Distance:    Right Eye Near:   Left Eye Near:    Bilateral Near:     Physical Exam Vitals signs and nursing note reviewed.  Constitutional:      General: She is not in acute distress.    Appearance: She is well-developed. She is not toxic-appearing or diaphoretic.  Abdominal:     General: Bowel sounds are normal. There is no distension.     Palpations: Abdomen is soft. There is no mass.     Tenderness: There is abdominal tenderness (diffuse, mild). There is no right CVA tenderness, left CVA tenderness, guarding or rebound.     Hernia: No hernia is present.  Neurological:     Mental  Status: She is alert.      UC Treatments / Results  Labs (all labs ordered are listed, but only abnormal results are displayed) Labs Reviewed - No data to display  EKG None  Radiology Dg Abd 2 Views  Result Date: 04/23/2018 CLINICAL DATA:  Left lower quadrant pain for several days EXAM: ABDOMEN - 2 VIEW COMPARISON:  11/18/15 FINDINGS: Scattered large and small bowel gas is noted. No free air is seen. No obstructive changes are noted. No abnormal mass or abnormal calcifications are noted. No bony abnormality is seen. IMPRESSION: No acute abnormality noted. Electronically Signed   By: Alcide Clever M.D.   On: 04/23/2018 14:31    Procedures Procedures (including critical care time)  Medications Ordered in UC Medications  ondansetron (ZOFRAN-ODT) disintegrating tablet 8 mg (8 mg Oral Given 04/23/18 1352)    Initial Impression / Assessment and Plan / UC Course  I have reviewed the triage vital signs and the nursing notes.  Pertinent labs & imaging results that were available during my care of the patient were reviewed by me and considered in my medical decision making (see chart for details).     Final Clinical Impressions(s) / UC Diagnoses   Final diagnoses:  Viral gastroenteritis    ED Prescriptions    Medication Sig Dispense Auth. Provider   ondansetron (ZOFRAN ODT) 8 MG disintegrating tablet Take 1 tablet (8 mg total) by mouth every 8 (eight) hours as needed. 6 tablet Payton Mccallum, MD     1. x-ray results and diagnosis reviewed with patient; given zofran 8mg  odt 2. rx as per orders above; reviewed possible side effects, interactions, risks and benefits  3. Recommend supportive treatment clear liquids then advance diet slowly as tolerated 4. Follow-up prn if symptoms worsen or don't improve  Controlled Substance Prescriptions [place] Controlled Substance Registry consulted? Not Applicable   Payton Mccallum, MD 04/23/18 1444

## 2018-04-23 NOTE — Discharge Instructions (Signed)
Clear liquid diet then advance diet to BRAT diet and slowly as tolerated Go to Emergency Department if symptoms worsen

## 2018-04-23 NOTE — ED Notes (Signed)
Patient tolerated ginger ale by mouth.

## 2018-04-24 ENCOUNTER — Encounter: Payer: Medicare HMO | Attending: Cardiovascular Disease

## 2018-04-24 DIAGNOSIS — E119 Type 2 diabetes mellitus without complications: Secondary | ICD-10-CM | POA: Insufficient documentation

## 2018-04-24 DIAGNOSIS — I428 Other cardiomyopathies: Secondary | ICD-10-CM | POA: Insufficient documentation

## 2018-04-24 DIAGNOSIS — I071 Rheumatic tricuspid insufficiency: Secondary | ICD-10-CM | POA: Insufficient documentation

## 2018-04-24 DIAGNOSIS — I5042 Chronic combined systolic (congestive) and diastolic (congestive) heart failure: Secondary | ICD-10-CM | POA: Insufficient documentation

## 2018-04-24 DIAGNOSIS — J449 Chronic obstructive pulmonary disease, unspecified: Secondary | ICD-10-CM | POA: Insufficient documentation

## 2018-04-24 DIAGNOSIS — Z87891 Personal history of nicotine dependence: Secondary | ICD-10-CM | POA: Insufficient documentation

## 2018-04-24 DIAGNOSIS — I110 Hypertensive heart disease with heart failure: Secondary | ICD-10-CM | POA: Insufficient documentation

## 2018-04-24 DIAGNOSIS — E7800 Pure hypercholesterolemia, unspecified: Secondary | ICD-10-CM | POA: Insufficient documentation

## 2018-04-24 DIAGNOSIS — Z8673 Personal history of transient ischemic attack (TIA), and cerebral infarction without residual deficits: Secondary | ICD-10-CM | POA: Insufficient documentation

## 2018-04-24 DIAGNOSIS — Z794 Long term (current) use of insulin: Secondary | ICD-10-CM | POA: Insufficient documentation

## 2018-04-24 DIAGNOSIS — Z7982 Long term (current) use of aspirin: Secondary | ICD-10-CM | POA: Insufficient documentation

## 2018-04-24 DIAGNOSIS — Z79899 Other long term (current) drug therapy: Secondary | ICD-10-CM | POA: Insufficient documentation

## 2018-04-24 DIAGNOSIS — I252 Old myocardial infarction: Secondary | ICD-10-CM | POA: Insufficient documentation

## 2018-04-25 ENCOUNTER — Emergency Department
Admission: EM | Admit: 2018-04-25 | Discharge: 2018-04-25 | Disposition: A | Discharge status: Home / Self Care | Payer: Medicare HMO | Attending: Emergency Medicine | Admitting: Emergency Medicine

## 2018-04-25 ENCOUNTER — Other Ambulatory Visit: Payer: Self-pay

## 2018-04-25 ENCOUNTER — Emergency Department: Payer: Medicare HMO

## 2018-04-25 ENCOUNTER — Encounter: Payer: Self-pay | Admitting: Emergency Medicine

## 2018-04-25 DIAGNOSIS — Z7982 Long term (current) use of aspirin: Secondary | ICD-10-CM | POA: Diagnosis not present

## 2018-04-25 DIAGNOSIS — Z8673 Personal history of transient ischemic attack (TIA), and cerebral infarction without residual deficits: Secondary | ICD-10-CM | POA: Diagnosis not present

## 2018-04-25 DIAGNOSIS — N179 Acute kidney failure, unspecified: Secondary | ICD-10-CM | POA: Diagnosis not present

## 2018-04-25 DIAGNOSIS — R1032 Left lower quadrant pain: Secondary | ICD-10-CM | POA: Insufficient documentation

## 2018-04-25 DIAGNOSIS — R197 Diarrhea, unspecified: Secondary | ICD-10-CM | POA: Diagnosis present

## 2018-04-25 DIAGNOSIS — E860 Dehydration: Secondary | ICD-10-CM | POA: Diagnosis not present

## 2018-04-25 DIAGNOSIS — Z87891 Personal history of nicotine dependence: Secondary | ICD-10-CM | POA: Diagnosis not present

## 2018-04-25 DIAGNOSIS — I252 Old myocardial infarction: Secondary | ICD-10-CM | POA: Insufficient documentation

## 2018-04-25 DIAGNOSIS — J449 Chronic obstructive pulmonary disease, unspecified: Secondary | ICD-10-CM | POA: Insufficient documentation

## 2018-04-25 DIAGNOSIS — E119 Type 2 diabetes mellitus without complications: Secondary | ICD-10-CM | POA: Diagnosis not present

## 2018-04-25 DIAGNOSIS — Z79899 Other long term (current) drug therapy: Secondary | ICD-10-CM | POA: Insufficient documentation

## 2018-04-25 DIAGNOSIS — J45909 Unspecified asthma, uncomplicated: Secondary | ICD-10-CM | POA: Diagnosis not present

## 2018-04-25 DIAGNOSIS — Z794 Long term (current) use of insulin: Secondary | ICD-10-CM | POA: Insufficient documentation

## 2018-04-25 DIAGNOSIS — F329 Major depressive disorder, single episode, unspecified: Secondary | ICD-10-CM | POA: Diagnosis not present

## 2018-04-25 DIAGNOSIS — I5042 Chronic combined systolic (congestive) and diastolic (congestive) heart failure: Secondary | ICD-10-CM | POA: Insufficient documentation

## 2018-04-25 DIAGNOSIS — I110 Hypertensive heart disease with heart failure: Secondary | ICD-10-CM | POA: Insufficient documentation

## 2018-04-25 LAB — URINALYSIS, COMPLETE (UACMP) WITH MICROSCOPIC
BILIRUBIN URINE: NEGATIVE
GLUCOSE, UA: NEGATIVE mg/dL
Hgb urine dipstick: NEGATIVE
KETONES UR: NEGATIVE mg/dL
LEUKOCYTE UA: NEGATIVE
NITRITE: NEGATIVE
PROTEIN: NEGATIVE mg/dL
Specific Gravity, Urine: 1.016 (ref 1.005–1.030)
pH: 5 (ref 5.0–8.0)

## 2018-04-25 LAB — COMPREHENSIVE METABOLIC PANEL
ALK PHOS: 104 U/L (ref 38–126)
ALT: 18 U/L (ref 0–44)
AST: 24 U/L (ref 15–41)
Albumin: 4.4 g/dL (ref 3.5–5.0)
Anion gap: 12 (ref 5–15)
BILIRUBIN TOTAL: 0.5 mg/dL (ref 0.3–1.2)
BUN: 32 mg/dL — AB (ref 8–23)
CALCIUM: 9.2 mg/dL (ref 8.9–10.3)
CO2: 30 mmol/L (ref 22–32)
CREATININE: 2.07 mg/dL — AB (ref 0.44–1.00)
Chloride: 94 mmol/L — ABNORMAL LOW (ref 98–111)
GFR, EST AFRICAN AMERICAN: 29 mL/min — AB (ref >60)
GFR, EST NON AFRICAN AMERICAN: 25 mL/min — AB (ref >60)
Glucose, Bld: 117 mg/dL — ABNORMAL HIGH (ref 70–99)
Potassium: 3.6 mmol/L (ref 3.5–5.1)
Sodium: 136 mmol/L (ref 135–145)
Total Protein: 7.5 g/dL (ref 6.5–8.1)

## 2018-04-25 LAB — CBC
HEMATOCRIT: 44.7 % (ref 36.0–46.0)
Hemoglobin: 14.2 g/dL (ref 12.0–15.0)
MCH: 29.4 pg (ref 26.0–34.0)
MCHC: 31.8 g/dL (ref 30.0–36.0)
MCV: 92.5 fL (ref 80.0–100.0)
NRBC: 0 % (ref 0.0–0.2)
PLATELETS: 209 10*3/uL (ref 150–400)
RBC: 4.83 MIL/uL (ref 3.87–5.11)
RDW: 14.2 % (ref 11.5–15.5)
WBC: 9.2 10*3/uL (ref 4.0–10.5)

## 2018-04-25 LAB — LIPASE, BLOOD: Lipase: 35 U/L (ref 11–51)

## 2018-04-25 MED ORDER — SODIUM CHLORIDE 0.9 % IV BOLUS
500.0000 mL | Freq: Once | INTRAVENOUS | Status: AC
  Administered 2018-04-25: 500 mL via INTRAVENOUS

## 2018-04-25 MED ORDER — IOPAMIDOL (ISOVUE-300) INJECTION 61%
15.0000 mL | INTRAVENOUS | Status: AC
  Administered 2018-04-25: 15 mL via ORAL

## 2018-04-25 NOTE — ED Notes (Signed)
Resumed care from ally rn.  Pt alert  Pt drinking po contrast.  Pt alert and watching tv

## 2018-04-25 NOTE — ED Provider Notes (Signed)
CT scan without signs of diverticulitis or other acute intra-abdominal pathology.  Discussed this with the patient.  Patient had received 500 mL's of fluid for her acute kidney injury.  I did discuss with patient the concern with the acute kidney injury and did offer admission.  Patient however was adamant that she would not be admitted to the hospital.  Did discuss with patient then that she should focus on hydration and that it would be very important she follows up with her primary care to have repeat blood work drawn.  Patient was not able to provide a stool sample here and stated she did not think she would be able to.  Will give patient sample cup that she could potentially grab a sample at home take to primary care.   Phineas Semen, MD 04/25/18 [phone]

## 2018-04-25 NOTE — ED Notes (Signed)
Lab states that they do not have labs that were sent in triage.

## 2018-04-25 NOTE — Discharge Instructions (Addendum)
As we discussed it is very important that you follow up with your primary care provider in the next couple of days to have your kidney values rechecked. Please seek medical attention for any high fevers, chest pain, shortness of breath, change in behavior, persistent vomiting, bloody stool or any other new or concerning symptoms.

## 2018-04-25 NOTE — ED Provider Notes (Signed)
[facility] Emergency Department Provider Note  ____________________________________________   I have reviewed the triage vital signs and the nursing notes. Where available I have reviewed prior notes and, if possible and indicated, outside hospital notes.    HISTORY  Chief Complaint Emesis and Dizziness    HPI Brenda Wu is a 63 y.o. female history of multiple medical problems including home oxygen presents today with diarrhea nonbloody no nausea, no vomiting, she has had this diarrhea for 1 week.  Nothing makes better nothing makes it worse went to an outpatient facility and was found to have likely viral GI pathology and went home.  Over the last couple days however she started developed some left lower quadrant pain.  No pain on driving over here.  The pain is nonradiating left lower quadrant abdominal pain seems to be worse when she actually has a diarrhea.  She states that she has had no recent travel no recent antibiotics and she otherwise feels well although she is a little head-to-head after she stands up in the toilet after having multiple episodes of diarrhea.  She cannot give me a sample at this time.,   Past Medical History:  Diagnosis Date  . Chronic combined systolic (congestive) and diastolic (congestive) heart failure (HCC)    a. 10/2017 Echo: EF 45-50%, diff HK. Mild to mod MR, mildly dil LA. Mod dil RV w/ mildly reduced RV fxn.  [name] COPD (chronic obstructive pulmonary disease) (HCC)   . Depression   . Diabetes mellitus without complication (HCC)   . Hypercholesteremia   . Hypertension   . Morbid obesity (HCC)   . NICM (nonischemic cardiomyopathy) (HCC)   . NSTEMI (non-ST elevated myocardial infarction) (HCC)    a. 12/2017 -->Cath: nl cors. EF 45-50%.  . Seizures (HCC)   . Stroke (HCC)   . Tricuspid regurgitation    a. 10/2017 Echo: Mild to mod TR.    Patient Active Problem List   Diagnosis Date Noted  . NICM (nonischemic  cardiomyopathy) (HCC) 02/13/2018  . Non-ST elevation (NSTEMI) myocardial infarction (HCC)   . Chronic combined systolic and diastolic heart failure (HCC) 12/12/2017  . HTN (hypertension) 12/12/2017  . Diabetes (HCC) 12/12/2017  . COPD exacerbation (HCC) 02/07/2017  . History of cerebellar stroke 03/12/2016  . History of seizures 03/12/2016  . Pulmonary nodule, left 01/05/2016  . Paroxysmal atrial fibrillation (HCC) 10/17/2015  . Cerebellar stroke (HCC) 06/26/2015  . Transient diplopia 06/09/2015  . Asthma-chronic obstructive pulmonary disease overlap syndrome (HCC) 05/30/2015  . COPD with asthma (HCC) 05/30/2015  . Hypoxemia 05/30/2015  . Obstructive sleep apnea syndrome 05/30/2015  . OSA on CPAP 05/30/2015  . Obesity (BMI 30-39.9) 03/12/2015  . Bilateral hearing loss 01/22/2015  . Generalized seizure disorder (HCC) 01/22/2015  . Hyperlipidemia associated with type 2 diabetes mellitus (HCC) 01/22/2015  . Hypertension associated with diabetes (HCC) 01/22/2015  . Morbid obesity due to excess calories (HCC) 01/22/2015  . Primary osteoarthritis involving multiple joints 01/22/2015  . Severe episode of recurrent major depressive disorder, without psychotic features (HCC) 01/22/2015    Past Surgical History:  Procedure Laterality Date  . ABDOMINAL HYSTERECTOMY    . LEFT HEART CATH AND CORONARY ANGIOGRAPHY N/A 01/09/2018   Procedure: LEFT HEART CATH AND CORONARY ANGIOGRAPHY;  Surgeon: Iran Ouch, MD;  Location: [facility] INVASIVE CV LAB;  Service: Cardiovascular;  Laterality: N/A;  . REPLACEMENT TOTAL KNEE      Prior to Admission medications   Medication Sig Start Date End  Date Taking? Authorizing Provider  albuterol (PROVENTIL HFA;VENTOLIN HFA) 108 (90 Base) MCG/ACT inhaler Inhale 2 puffs into the lungs 4 (four) times daily as needed for wheezing or shortness of breath.  01/05/16   [provider]  albuterol (PROVENTIL) (2.5 MG/3ML) 0.083% nebulizer solution Inhale 2.5 mg  into the lungs every 6 (six) hours as needed for wheezing or shortness of breath.  10/01/16   [provider]  aspirin EC 81 MG tablet Take 1 tablet (81 mg total) by mouth daily. 02/13/18   [name], MD  atorvastatin (LIPITOR) 40 MG tablet Take 40 mg by mouth every evening.     [provider]  budesonide-formoterol (SYMBICORT) 160-4.5 MCG/ACT inhaler Inhale 2 puffs into the lungs 2 (two) times daily. 05/22/15   [provider]  diltiazem (CARDIZEM CD) 180 MG 24 hr capsule Take 180 mg by mouth daily.    [provider]  ferrous sulfate 325 (65 FE) MG tablet Take 325 mg by mouth daily with breakfast.    [provider]  glipiZIDE (GLUCOTROL) 10 MG tablet Take 10 mg by mouth 2 (two) times daily before a meal.     [provider]  guaiFENesin (MUCINEX) 600 MG 12 hr tablet Take 600 mg by mouth 2 (two) times daily as needed.     [provider]  Insulin Glargine (LANTUS SOLOSTAR) 100 UNIT/ML Solostar Pen Inject 20-25 Units into the skin daily.  01/21/17   [provider]  lamoTRIgine (LAMICTAL) 200 MG tablet Take 200 mg by mouth 2 (two) times daily.     [provider]  losartan (COZAAR) 50 MG tablet Take 50 mg by mouth daily.    [provider]  Multiple Vitamin (MULTI-VITAMINS) TABS Take 1 tablet by mouth daily.    [provider]  niacin 500 MG tablet Take 500 mg by mouth daily.    [provider]  ondansetron (ZOFRAN ODT) 8 MG disintegrating tablet Take 1 tablet (8 mg total) by mouth every 8 (eight) hours as needed. 04/23/18   Payton Mccallum, MD  potassium chloride SA (K-DUR,KLOR-CON) 20 MEQ tablet Take 1 tablet (20 mEq total) by mouth daily. 02/12/17   [name], MD  pregabalin (LYRICA) 75 MG capsule Take 75 mg by mouth 2 (two) times daily.    [provider]  senna (SENOKOT) 8.6 MG TABS tablet Take 1 tablet by mouth daily.     [provider]  sitaGLIPtin  (JANUVIA) 50 MG tablet Take 50 mg by mouth daily.    [provider]  tiotropium (SPIRIVA) 18 MCG inhalation capsule Place 18 mcg into inhaler and inhale daily.    [provider]  topiramate (TOPAMAX) 200 MG tablet Take 200 mg by mouth 2 (two) times daily.    [provider]  torsemide (DEMADEX) 20 MG tablet Take 2 tablets (40 mg total) by mouth daily. 01/26/18   Creig Hines, NP  vitamin B-12 (CYANOCOBALAMIN) 500 MCG tablet Take 500 mcg by mouth daily.    [provider]    Allergies Metformin and Bismuth subsalicylate  Family History  Problem Relation Age of Onset  . Hypertension Father   . Asthma Sister   . Heart murmur Sister   . Diabetes Sister     Social History Social History   Tobacco Use  . Smoking status: Former Smoker    Packs/day: 1.00    Years: 30.00    Pack years: 30.00    Types: Cigarettes  Last attempt to quit: 02/22/2002    Years since quitting: 16.1  . Smokeless tobacco: Never Used  Substance Use Topics  . Alcohol use: No  . Drug use: No    Review of Systems Constitutional: No fever/chills Eyes: No visual changes. ENT: No sore throat. No stiff neck no neck pain Cardiovascular: Denies chest pain. Respiratory: Denies shortness of breath. Gastrointestinal:   no vomiting.  + diarrhea.  No constipation. Genitourinary: Negative for dysuria. Musculoskeletal: Negative lower extremity swelling Skin: Negative for rash. Neurological: Negative for severe headaches, focal weakness or numbness.   ____________________________________________   PHYSICAL EXAM:  VITAL SIGNS: ED Triage Vitals  Enc Vitals Group     BP 04/25/18 1143 130/84     Pulse Rate 04/25/18 1400 80     Resp 04/25/18 1400 (!) 23     Temp 04/25/18 1143 98 F (36.7 C)     Temp Source 04/25/18 1143 Oral     SpO2 04/25/18 1143 97 %     Weight 04/25/18 1121 197 lb 15.6 oz (89.8 kg)     Height 04/25/18 1121 5\' 3"  (1.6 m)     Head  Circumference --      Peak Flow --      Pain Score --      Pain Loc --      Pain Edu? --      Excl. in GC? --     Constitutional: Alert and oriented. Well appearing and in no acute distress. Eyes: Conjunctivae are normal Head: Atraumatic HEENT: No congestion/rhinnorhea. Mucous membranes are moist.  Oropharynx non-erythematous Neck:   Nontender with no meningismus, no masses, no stridor Cardiovascular: Normal rate, regular rhythm. Grossly normal heart sounds.  Good peripheral circulation. Respiratory: Normal respiratory effort.  No retractions. Lungs CTAB. Abdominal: Soft and positive left lower quadrant abdominal tenderness, no guarding or rebound. No distention. No guarding no rebound Back:  There is no focal tenderness or step off.  there is no midline tenderness there are no lesions noted. there is no CVA tenderness Musculoskeletal: No lower extremity tenderness, no upper extremity tenderness. No joint effusions, no DVT signs strong distal pulses no edema Neurologic:  Normal speech and language. No gross focal neurologic deficits are appreciated.  Skin:  Skin is warm, dry and intact. No rash noted. Psychiatric: Mood and affect are normal. Speech and behavior are normal.  ____________________________________________   LABS (all labs ordered are listed, but only abnormal results are displayed)  Labs Reviewed  COMPREHENSIVE METABOLIC PANEL - Abnormal; Notable for the following components:      Result Value   Chloride 94 (*)    Glucose, Bld 117 (*)    BUN 32 (*)    Creatinine, Ser 2.07 (*)    GFR calc non Af Amer 25 (*)    GFR calc Af Amer 29 (*)    All other components within normal limits  URINALYSIS, COMPLETE (UACMP) WITH MICROSCOPIC - Abnormal; Notable for the following components:   Color, Urine YELLOW (*)    APPearance CLEAR (*)    Bacteria, UA FEW (*)    All other components within normal limits  C DIFFICILE QUICK SCREEN W PCR REFLEX  GASTROINTESTINAL PANEL BY PCR,  STOOL (REPLACES STOOL CULTURE)  LIPASE, BLOOD  CBC    Pertinent labs  results that were available during my care of the patient were reviewed by me and considered in my medical decision making (see chart for details). ____________________________________________  EKG  I personally interpreted any  EKGs ordered by me or triage  ____________________________________________  RADIOLOGY  Pertinent labs & imaging results that were available during my care of the patient were reviewed by me and considered in my medical decision making (see chart for details). If possible, patient and/or family made aware of any abnormal findings.  No results found. ____________________________________________    PROCEDURES  Procedure(s) performed: None  Procedures  Critical Care performed: None  ____________________________________________   INITIAL IMPRESSION / ASSESSMENT AND PLAN / ED COURSE  Pertinent labs & imaging results that were available during my care of the patient were reviewed by me and considered in my medical decision making (see chart for details).  Patient here with diarrhea and some degree of left lower quadrant abdominal discomfort.  She also has some evidence of elevated creatinine, we are giving her IV fluids here, but she does have a history of CHF so we will do so gingerly.  Given her left lower quadrant pain I will obtain CT scan to rule out diverticulitis, and we will see if she can give [place] a stool sample for C. difficile.  Otherwise, she is alert and in no acute distress. Signed out to dr. Derrill Kay at the end of my shift.   ____________________________________________   FINAL CLINICAL IMPRESSION(S) / ED DIAGNOSES  Final diagnoses:  None      This chart was dictated using voice recognition software.  Despite best efforts to proofread,  errors can occur which can change meaning.     Jeanmarie Plant, MD 04/25/18 [phone]

## 2018-04-25 NOTE — ED Triage Notes (Signed)
Says seen for same few days ago , but no better.  Nausea, vomiting and dizziness.

## 2018-04-25 NOTE — ED Notes (Signed)
Pt watching tv.  md in with pt now

## 2018-04-27 ENCOUNTER — Telehealth: Payer: Self-pay

## 2018-04-27 DIAGNOSIS — I5042 Chronic combined systolic (congestive) and diastolic (congestive) heart failure: Secondary | ICD-10-CM

## 2018-04-27 NOTE — Telephone Encounter (Signed)
Patient called to say that she was having some dehydration issues and hopes to be back next week.

## 2018-04-27 NOTE — Telephone Encounter (Signed)
LM on VM for patient to call back to Pulmonary Rehab.

## 2018-05-01 DIAGNOSIS — Z7982 Long term (current) use of aspirin: Secondary | ICD-10-CM | POA: Diagnosis not present

## 2018-05-01 DIAGNOSIS — I5042 Chronic combined systolic (congestive) and diastolic (congestive) heart failure: Secondary | ICD-10-CM

## 2018-05-01 DIAGNOSIS — I428 Other cardiomyopathies: Secondary | ICD-10-CM | POA: Diagnosis not present

## 2018-05-01 DIAGNOSIS — E119 Type 2 diabetes mellitus without complications: Secondary | ICD-10-CM | POA: Diagnosis not present

## 2018-05-01 DIAGNOSIS — Z79899 Other long term (current) drug therapy: Secondary | ICD-10-CM | POA: Diagnosis not present

## 2018-05-01 DIAGNOSIS — Z87891 Personal history of nicotine dependence: Secondary | ICD-10-CM | POA: Diagnosis not present

## 2018-05-01 DIAGNOSIS — I252 Old myocardial infarction: Secondary | ICD-10-CM | POA: Diagnosis not present

## 2018-05-01 DIAGNOSIS — E7800 Pure hypercholesterolemia, unspecified: Secondary | ICD-10-CM | POA: Diagnosis not present

## 2018-05-01 DIAGNOSIS — I071 Rheumatic tricuspid insufficiency: Secondary | ICD-10-CM | POA: Diagnosis not present

## 2018-05-01 DIAGNOSIS — Z794 Long term (current) use of insulin: Secondary | ICD-10-CM | POA: Diagnosis not present

## 2018-05-01 DIAGNOSIS — Z8673 Personal history of transient ischemic attack (TIA), and cerebral infarction without residual deficits: Secondary | ICD-10-CM | POA: Diagnosis not present

## 2018-05-01 DIAGNOSIS — J449 Chronic obstructive pulmonary disease, unspecified: Secondary | ICD-10-CM | POA: Diagnosis not present

## 2018-05-01 DIAGNOSIS — I110 Hypertensive heart disease with heart failure: Secondary | ICD-10-CM | POA: Diagnosis not present

## 2018-05-01 NOTE — Progress Notes (Signed)
Daily Session Note  Patient Details  Name: Brenda Wu MRN: [id number] Date of Birth: September 03, 1955 Referring Provider:     Pulmonary Rehab from 03/14/2018 in [facility]  Referring Provider  [place]      Encounter Date: 05/01/2018  Check In: Session Check In - 05/01/18 0956      Check-In   Supervising physician immediately available to respond to emergencies  [facility] immediately available ER MD    Physician(s)  Dr. Jimmye Norman and Quentin Cornwall    Location  [facility]    Staff Present  Justin Mend RCP,RRT,BSRT;Amanda Oletta Darter, [place], ACSM CEP, Exercise Physiologist;Kelly Amedeo Plenty, BS, ACSM CEP, Exercise Physiologist    Medication changes reported      No    Fall or balance concerns reported     No    Warm-up and Cool-down  Performed as group-led instruction    Resistance Training Performed  Yes    VAD Patient?  No    PAD/SET Patient?  No      Pain Assessment   Currently in Pain?  No/denies          Social History   Tobacco Use  Smoking Status Former Smoker  . Packs/day: 1.00  . Years: 30.00  . Pack years: 30.00  . Types: Cigarettes  . Last attempt to quit: 02/22/2002  . Years since quitting: 16.1  Smokeless Tobacco Never Used    Goals Met:  Independence with exercise equipment Exercise tolerated well No report of cardiac concerns or symptoms Strength training completed today  Goals Unmet:  Not Applicable  Comments: Pt able to follow exercise prescription today without complaint.  Will continue to monitor for progression.    Dr. Emily Filbert is Medical Director for [facility] and [facility].

## 2018-05-01 NOTE — Progress Notes (Signed)
Pulmonary Individual Treatment Plan  Patient Details  Name: Brenda Wu MRN: [id number] Date of Birth: [birth year] Referring Provider:     Pulmonary Rehab from 03/14/2018 in [facility] Cardiac and Pulmonary Rehab  Referring Provider  Gollan      Initial Encounter Date:    Pulmonary Rehab from 03/14/2018 in [facility] Cardiac and Pulmonary Rehab  Date  03/14/18      Visit Diagnosis: Heart failure, systolic and diastolic, chronic ([place])  Patient's Home Medications on Admission:  Current Outpatient Medications:  .  albuterol (PROVENTIL HFA;VENTOLIN HFA) 108 (90 Base) MCG/ACT inhaler, Inhale 2 puffs into the lungs 4 (four) times daily as needed for wheezing or shortness of breath. , Disp: , Rfl:  .  albuterol (PROVENTIL) (2.5 MG/3ML) 0.083% nebulizer solution, Inhale 2.5 mg into the lungs every 6 (six) hours as needed for wheezing or shortness of breath. , Disp: , Rfl:  .  aspirin EC 81 MG tablet, Take 1 tablet (81 mg total) by mouth daily., Disp: 32 tablet, Rfl: 0 .  atorvastatin (LIPITOR) 40 MG tablet, Take 40 mg by mouth every evening. , Disp: , Rfl:  .  budesonide-formoterol (SYMBICORT) 160-4.5 MCG/ACT inhaler, Inhale 2 puffs into the lungs 2 (two) times daily., Disp: , Rfl:  .  diltiazem (CARDIZEM CD) 180 MG 24 hr capsule, Take 180 mg by mouth daily., Disp: , Rfl:  .  ferrous sulfate 325 (65 FE) MG tablet, Take 325 mg by mouth daily with breakfast., Disp: , Rfl:  .  glipiZIDE (GLUCOTROL) 10 MG tablet, Take 10 mg by mouth 2 (two) times daily before a meal. , Disp: , Rfl:  .  guaiFENesin (MUCINEX) 600 MG 12 hr tablet, Take 600 mg by mouth 2 (two) times daily as needed. , Disp: , Rfl:  .  Insulin Glargine (LANTUS SOLOSTAR) 100 UNIT/ML Solostar Pen, Inject 20-25 Units into the skin daily. , Disp: , Rfl:  .  lamoTRIgine (LAMICTAL) 200 MG tablet, Take 200 mg by mouth 2 (two) times daily. , Disp: , Rfl:  .  losartan (COZAAR) 50 MG tablet, Take 50 mg by mouth daily., Disp: , Rfl:  .  Multiple Vitamin  (MULTI-VITAMINS) TABS, Take 1 tablet by mouth daily., Disp: , Rfl:  .  niacin 500 MG tablet, Take 500 mg by mouth daily., Disp: , Rfl:  .  ondansetron (ZOFRAN ODT) 8 MG disintegrating tablet, Take 1 tablet (8 mg total) by mouth every 8 (eight) hours as needed., Disp: 6 tablet, Rfl: 0 .  potassium chloride SA (K-DUR,KLOR-CON) 20 MEQ tablet, Take 1 tablet (20 mEq total) by mouth daily., Disp: 30 tablet, Rfl: 2 .  pregabalin (LYRICA) 75 MG capsule, Take 75 mg by mouth 2 (two) times daily., Disp: , Rfl:  .  senna (SENOKOT) 8.6 MG TABS tablet, Take 1 tablet by mouth daily. , Disp: , Rfl:  .  sitaGLIPtin (JANUVIA) 50 MG tablet, Take 50 mg by mouth daily., Disp: , Rfl:  .  tiotropium (SPIRIVA) 18 MCG inhalation capsule, Place 18 mcg into inhaler and inhale daily., Disp: , Rfl:  .  topiramate (TOPAMAX) 200 MG tablet, Take 200 mg by mouth 2 (two) times daily., Disp: , Rfl:  .  torsemide (DEMADEX) 20 MG tablet, Take 2 tablets (40 mg total) by mouth daily., Disp: 90 tablet, Rfl: 0 .  vitamin B-12 (CYANOCOBALAMIN) 500 MCG tablet, Take 500 mcg by mouth daily., Disp: , Rfl:   Past Medical History: Past Medical History:  Diagnosis Date  . Chronic combined systolic (congestive)  and diastolic (congestive) heart failure ([place])    a. 10/2017 Echo: EF 45-50%, diff HK. Mild to mod MR, mildly dil LA. Mod dil RV w/ mildly reduced RV fxn.  [name] COPD (chronic obstructive pulmonary disease) ([place])   . Depression   . Diabetes mellitus without complication ([place])   . Hypercholesteremia   . Hypertension   . Morbid obesity ([place])   . NICM (nonischemic cardiomyopathy) ([place])   . NSTEMI (non-ST elevated myocardial infarction) ([employer])    a. 12/2017 -->Cath: nl cors. EF 45-50%.  . Seizures ([place])   . Stroke ([place])   . Tricuspid regurgitation    a. 10/2017 Echo: Mild to mod TR.    Tobacco Use: Social History   Tobacco Use  Smoking Status Former Smoker  . Packs/day: 1.00  . Years: 30.00  . Pack years: 30.00  . Types:  Cigarettes  . Last attempt to quit: 02/22/2002  . Years since quitting: 16.1  Smokeless Tobacco Never Used    Labs: Recent Chemical engineer    Labs for ITP Cardiac and Pulmonary Rehab Latest Ref Rng & Units 11/23/2017 01/05/2018 01/05/2018 01/05/2018 01/06/2018   Hemoglobin A1c 4.8 - 5.6 % 8.2(H) - - 7.4(H) -   PHART 7.350 - 7.450 - - 7.24(L) - 7.32(L)   PCO2ART 32.0 - 48.0 mmHg - - 84(HH) - 65(H)   HCO3 20.0 - 28.0 mmol/L 39.0(H) 34.4(H) 35.2(H) - 33.5(H)   O2SAT % - 99.2 90.2 - 81.6       Pulmonary Assessment Scores: Pulmonary Assessment Scores    Row Name 03/14/18 1222         ADL UCSD   ADL Phase  Entry     SOB Score total  54     Rest  0     Walk  3     Stairs  5     Bath  2     Dress  1     Shop  4       CAT Score   CAT Score  12       mMRC Score   mMRC Score  3        Pulmonary Function Assessment: Pulmonary Function Assessment - 03/14/18 1150      Breath   Bilateral Breath Sounds  Clear    Shortness of Breath  Yes;Fear of Shortness of Breath;Limiting activity;Panic with Shortness of Breath       Exercise Target Goals: Exercise Program Goal: Individual exercise prescription set using results from initial 6 min walk test and THRR while considering  patient's activity barriers and safety.   Exercise Prescription Goal: Initial exercise prescription builds to 30-45 minutes a day of aerobic activity, 2-3 days per week.  Home exercise guidelines will be given to patient during program as part of exercise prescription that the participant will acknowledge.  Activity Barriers & Risk Stratification:   6 Minute Walk: 6 Minute Walk    Row Name 03/14/18 1326         6 Minute Walk   Phase  Initial     Distance  765 feet     Walk Time  5.25 minutes     # of Rest Breaks  1     MPH  1.65     METS  2.2     RPE  17     Perceived Dyspnea   2.5     VO2 Peak  7.7     Symptoms  No  Resting HR  84 bpm     Resting BP  94/60     Resting Oxygen  Saturation   96 %     Exercise Oxygen Saturation  during 6 min walk  83 %     Max Ex. HR  139 bpm     Max Ex. BP  120/64     2 Minute Post BP  116/58       Interval HR   1 Minute HR  84     3 Minute HR  116     4 Minute HR  131     5 Minute HR  129     6 Minute HR  139     2 Minute Post HR  96     Interval Heart Rate?  Yes       Interval Oxygen   Interval Oxygen?  Yes     Baseline Oxygen Saturation %  96 %     1 Minute Oxygen Saturation %  91 %     1 Minute Liters of Oxygen  2 L     2 Minute Liters of Oxygen  2 L     3 Minute Oxygen Saturation %  90 %     3 Minute Liters of Oxygen  2 L     4 Minute Oxygen Saturation %  87 %     4 Minute Liters of Oxygen  2 L     5 Minute Oxygen Saturation %  85 %     5 Minute Liters of Oxygen  2 L     6 Minute Oxygen Saturation %  83 %     6 Minute Liters of Oxygen  2 L     2 Minute Post Oxygen Saturation %  93 %     2 Minute Post Liters of Oxygen  2 L       Oxygen Initial Assessment: Oxygen Initial Assessment - 03/14/18 1149      Home Oxygen   Home Oxygen Device  Home Concentrator;E-Tanks    Sleep Oxygen Prescription  Continuous;BiPAP    Liters per minute  2    Home Exercise Oxygen Prescription  Continuous    Liters per minute  2    Home at Rest Exercise Oxygen Prescription  Continuous    Liters per minute  2    Compliance with Home Oxygen Use  Yes      Initial 6 min Walk   Oxygen Used  Continuous;E-Tanks    Liters per minute  2      Program Oxygen Prescription   Program Oxygen Prescription  Continuous;E-Tanks    Liters per minute  2      Intervention   Short Term Goals  To learn and exhibit compliance with exercise, home and travel O2 prescription;To learn and understand importance of monitoring SPO2 with pulse oximeter and demonstrate accurate use of the pulse oximeter.;To learn and understand importance of maintaining oxygen saturations>88%;To learn and demonstrate proper pursed lip breathing techniques or other breathing  techniques.;To learn and demonstrate proper use of respiratory medications    Long  Term Goals  Exhibits compliance with exercise, home and travel O2 prescription;Verbalizes importance of monitoring SPO2 with pulse oximeter and return demonstration;Maintenance of O2 saturations>88%;Exhibits proper breathing techniques, such as pursed lip breathing or other method taught during program session;Compliance with respiratory medication;Demonstrates proper use of MDI's       Oxygen Re-Evaluation: Oxygen Re-Evaluation    Row Name 03/20/18 1025  Program Oxygen Prescription   Program Oxygen Prescription  Continuous;E-Tanks       Liters per minute  2         Home Oxygen   Home Oxygen Device  Home Concentrator;E-Tanks       Sleep Oxygen Prescription  Continuous;BiPAP       Liters per minute  2       Home Exercise Oxygen Prescription  Continuous       Liters per minute  2       Home at Rest Exercise Oxygen Prescription  Continuous       Liters per minute  2       Compliance with Home Oxygen Use  Yes         Goals/Expected Outcomes   Short Term Goals  To learn and exhibit compliance with exercise, home and travel O2 prescription;To learn and understand importance of monitoring SPO2 with pulse oximeter and demonstrate accurate use of the pulse oximeter.;To learn and understand importance of maintaining oxygen saturations>88%;To learn and demonstrate proper pursed lip breathing techniques or other breathing techniques.;To learn and demonstrate proper use of respiratory medications       Long  Term Goals  Exhibits compliance with exercise, home and travel O2 prescription;Verbalizes importance of monitoring SPO2 with pulse oximeter and return demonstration;Maintenance of O2 saturations>88%;Exhibits proper breathing techniques, such as pursed lip breathing or other method taught during program session;Compliance with respiratory medication;Demonstrates proper use of MDI's       Comments   Reviewed PLB technique with pt.  Talked about how it work and it's important to maintaining his exercise saturations.         Goals/Expected Outcomes  Short: Become more profiecient at using PLB.   Long: Become independent at using PLB.          Oxygen Discharge (Final Oxygen Re-Evaluation): Oxygen Re-Evaluation - 03/20/18 1025      Program Oxygen Prescription   Program Oxygen Prescription  Continuous;E-Tanks    Liters per minute  2      Home Oxygen   Home Oxygen Device  Home Concentrator;E-Tanks    Sleep Oxygen Prescription  Continuous;BiPAP    Liters per minute  2    Home Exercise Oxygen Prescription  Continuous    Liters per minute  2    Home at Rest Exercise Oxygen Prescription  Continuous    Liters per minute  2    Compliance with Home Oxygen Use  Yes      Goals/Expected Outcomes   Short Term Goals  To learn and exhibit compliance with exercise, home and travel O2 prescription;To learn and understand importance of monitoring SPO2 with pulse oximeter and demonstrate accurate use of the pulse oximeter.;To learn and understand importance of maintaining oxygen saturations>88%;To learn and demonstrate proper pursed lip breathing techniques or other breathing techniques.;To learn and demonstrate proper use of respiratory medications    Long  Term Goals  Exhibits compliance with exercise, home and travel O2 prescription;Verbalizes importance of monitoring SPO2 with pulse oximeter and return demonstration;Maintenance of O2 saturations>88%;Exhibits proper breathing techniques, such as pursed lip breathing or other method taught during program session;Compliance with respiratory medication;Demonstrates proper use of MDI's    Comments  Reviewed PLB technique with pt.  Talked about how it work and it's important to maintaining his exercise saturations.      Goals/Expected Outcomes  Short: Become more profiecient at using PLB.   Long: Become independent at using PLB.  Initial Exercise  Prescription: Initial Exercise Prescription - 03/14/18 1300      Date of Initial Exercise RX and Referring Provider   Date  03/14/18    Referring Provider  Gollan      Treadmill   MPH  1.5    Grade  0    Minutes  15    METs  2.15      NuStep   Level  2    SPM  80    Minutes  15    METs  2      Biostep-RELP   Level  2    SPM  50    Minutes  15    METs  2      Prescription Details   Frequency (times per week)  3    Duration  Progress to 45 minutes of aerobic exercise without signs/symptoms of physical distress      Intensity   THRR 40-80% of Max Heartrate  114-144    Ratings of Perceived Exertion  11-15    Perceived Dyspnea  0-4      Resistance Training   Training Prescription  Yes    Weight  3 lb    Reps  10-15       Perform Capillary Blood Glucose checks as needed.  Exercise Prescription Changes: Exercise Prescription Changes    Row Name 03/23/18 0800 04/05/18 1000 04/05/18 1400 04/19/18 1200       Response to Exercise   Blood Pressure (Admit)  118/66  -  134/62  126/72    Blood Pressure (Exercise)  124/62  -  -  -    Blood Pressure (Exit)  132/64  -  132/60  124/62    Heart Rate (Admit)  95 bpm  -  88 bpm  95 bpm    Heart Rate (Exercise)  123 bpm  -  100 bpm  110 bpm    Heart Rate (Exit)  91 bpm  -  90 bpm  84 bpm    Oxygen Saturation (Admit)  92 %  -  97 %  90 %    Oxygen Saturation (Exercise)  89 %  -  91 %  90 %    Oxygen Saturation (Exit)  95 %  -  95 %  95 %    Rating of Perceived Exertion (Exercise)  13  -  13  13    Perceived Dyspnea (Exercise)  2  -  0  2    Symptoms  none  -  none  none    Comments  second day exercise  -  -  -    Duration  Progress to 45 minutes of aerobic exercise without signs/symptoms of physical distress  -  Continue with 45 min of aerobic exercise without signs/symptoms of physical distress.  Continue with 45 min of aerobic exercise without signs/symptoms of physical distress.    Intensity  THRR unchanged  -  THRR  unchanged  THRR unchanged      Progression   Progression  Continue to progress workloads to maintain intensity without signs/symptoms of physical distress.  -  Continue to progress workloads to maintain intensity without signs/symptoms of physical distress.  Continue to progress workloads to maintain intensity without signs/symptoms of physical distress.    Average METs  2.4  -  2.42  2.9      Resistance Training   Training Prescription  Yes  -  Yes  Yes    Weight  3 lb  -  3 lbs  3 lb    Reps  10-15  -  10-15  10-15      Interval Training   Interval Training  -  -  No  No      Oxygen   Oxygen  -  -  Continuous  Continuous    Liters  -  -  2  2      Treadmill   MPH  2  -  2  2    Grade  0.5  -  0.5  1    Minutes  15  -  15  15    METs  2.67  -  2.67  2.81      NuStep   Level  2  -  2  -    SPM  80  -  -  -    Minutes  15  -  15  -    METs  2.1  -  2.6  -      REL-XR   Level  -  -  -  5    Minutes  -  -  -  15    METs  -  -  -  4.3      Biostep-RELP   Level  -  -  1  1    SPM  -  -  -  50    Minutes  -  -  15  15    METs  -  -  2  2      Home Exercise Plan   Plans to continue exercise at  -  Home (comment) walking, elliptical, bike  Home (comment) walking, elliptical, bike  Home (comment) walking, elliptical, bike    Frequency  -  Add 1 additional day to program exercise sessions.  Add 1 additional day to program exercise sessions.  Add 1 additional day to program exercise sessions.    Initial Home Exercises Provided  -  04/05/18  04/05/18  04/05/18       Exercise Comments: Exercise Comments    Row Name 03/20/18 1023           Exercise Comments   First full day of exercise!  Patient was oriented to gym and equipment including functions, settings, policies, and procedures.  Patient's individual exercise prescription and treatment plan were reviewed.  All starting workloads were established based on the results of the 6 minute walk test done at initial orientation  visit.  The plan for exercise progression was also introduced and progression will be customized based on patient's performance and goals.          Exercise Goals and Review: Exercise Goals    Row Name 03/14/18 1325             Exercise Goals   Increase Physical Activity  Yes       Intervention  Provide advice, education, support and counseling about physical activity/exercise needs.;Develop an individualized exercise prescription for aerobic and resistive training based on initial evaluation findings, risk stratification, comorbidities and participant's personal goals.       Expected Outcomes  Short Term: Attend rehab on a regular basis to increase amount of physical activity.;Long Term: Add in home exercise to make exercise part of routine and to increase amount of physical activity.;Long Term: Exercising regularly at least 3-5 days a week.       Increase Strength and Stamina  Yes  Intervention  Provide advice, education, support and counseling about physical activity/exercise needs.;Develop an individualized exercise prescription for aerobic and resistive training based on initial evaluation findings, risk stratification, comorbidities and participant's personal goals.       Expected Outcomes  Short Term: Increase workloads from initial exercise prescription for resistance, speed, and METs.;Short Term: Perform resistance training exercises routinely during rehab and add in resistance training at home;Long Term: Improve cardiorespiratory fitness, muscular endurance and strength as measured by increased METs and functional capacity (6MWT)       Able to understand and use rate of perceived exertion (RPE) scale  Yes       Intervention  Provide education and explanation on how to use RPE scale       Expected Outcomes  Short Term: Able to use RPE daily in rehab to express subjective intensity level;Long Term:  Able to use RPE to guide intensity level when exercising independently       Able  to understand and use Dyspnea scale  Yes       Intervention  Provide education and explanation on how to use Dyspnea scale       Expected Outcomes  Short Term: Able to use Dyspnea scale daily in rehab to express subjective sense of shortness of breath during exertion;Long Term: Able to use Dyspnea scale to guide intensity level when exercising independently       Knowledge and understanding of Target Heart Rate Range (THRR)  Yes       Intervention  Provide education and explanation of THRR including how the numbers were predicted and where they are located for reference       Expected Outcomes  Short Term: Able to state/look up THRR;Short Term: Able to use daily as guideline for intensity in rehab;Long Term: Able to use THRR to govern intensity when exercising independently       Able to check pulse independently  Yes       Intervention  Provide education and demonstration on how to check pulse in carotid and radial arteries.;Review the importance of being able to check your own pulse for safety during independent exercise       Expected Outcomes  Short Term: Able to explain why pulse checking is important during independent exercise;Long Term: Able to check pulse independently and accurately       Understanding of Exercise Prescription  Yes       Intervention  Provide education, explanation, and written materials on patient's individual exercise prescription       Expected Outcomes  Short Term: Able to explain program exercise prescription;Long Term: Able to explain home exercise prescription to exercise independently          Exercise Goals Re-Evaluation : Exercise Goals Re-Evaluation    Row Name 03/20/18 1023 03/23/18 0846 04/05/18 1040 04/19/18 1240       Exercise Goal Re-Evaluation   Exercise Goals Review  Increase Physical Activity;Increase Strength and Stamina;Able to understand and use rate of perceived exertion (RPE) scale;Knowledge and understanding of Target Heart Rate Range  (THRR);Understanding of Exercise Prescription;Able to understand and use Dyspnea scale  Increase Physical Activity;Increase Strength and Stamina;Able to understand and use rate of perceived exertion (RPE) scale;Able to understand and use Dyspnea scale;Knowledge and understanding of Target Heart Rate Range (THRR)  Increase Physical Activity;Able to understand and use rate of perceived exertion (RPE) scale;Knowledge and understanding of Target Heart Rate Range (THRR);Understanding of Exercise Prescription;Increase Strength and Stamina;Able to check pulse independently;Able to understand and  use Dyspnea scale  Increase Physical Activity;Increase Strength and Stamina;Able to understand and use rate of perceived exertion (RPE) scale;Able to understand and use Dyspnea scale;Knowledge and understanding of Target Heart Rate Range (THRR);Understanding of Exercise Prescription    Comments  Reviewed RPE scale, THR and program prescription with pt today.  Pt voiced understanding and was given a copy of goals to take home.   Zetha has tolerated exercise well and increased speed and grade on TM.  Staff will monitor progress.  Reviewed home exercise with pt today.  Pt plans to walk and use elliptical and bike at home for exercise.  Reviewed THR, pulse, RPE, sign and symptoms, and when to call 911 or MD.  Also discussed weather considerations and indoor options.  Pt voiced understanding.  Tylea has improved overall MET level.  She requested change from NS to XR for more challenge. Staff will monitor progress.    Expected Outcomes  Short: Use RPE daily to regulate intensity. Long: Follow program prescription in THR.  Short - attend LW consistently Long - increase MET level  Short: Start to add in exercise at home.  Long: Continue to increase physical activity  Short - attend consistently Long - increase MET level       Discharge Exercise Prescription (Final Exercise Prescription Changes): Exercise Prescription Changes -  04/19/18 1200      Response to Exercise   Blood Pressure (Admit)  126/72    Blood Pressure (Exit)  124/62    Heart Rate (Admit)  95 bpm    Heart Rate (Exercise)  110 bpm    Heart Rate (Exit)  84 bpm    Oxygen Saturation (Admit)  90 %    Oxygen Saturation (Exercise)  90 %    Oxygen Saturation (Exit)  95 %    Rating of Perceived Exertion (Exercise)  13    Perceived Dyspnea (Exercise)  2    Symptoms  none    Duration  Continue with 45 min of aerobic exercise without signs/symptoms of physical distress.    Intensity  THRR unchanged      Progression   Progression  Continue to progress workloads to maintain intensity without signs/symptoms of physical distress.    Average METs  2.9      Resistance Training   Training Prescription  Yes    Weight  3 lb    Reps  10-15      Interval Training   Interval Training  No      Oxygen   Oxygen  Continuous    Liters  2      Treadmill   MPH  2    Grade  1    Minutes  15    METs  2.81      REL-XR   Level  5    Minutes  15    METs  4.3      Biostep-RELP   Level  1    SPM  50    Minutes  15    METs  2      Home Exercise Plan   Plans to continue exercise at  Home (comment)   walking, elliptical, bike   Frequency  Add 1 additional day to program exercise sessions.    Initial Home Exercises Provided  04/05/18       Nutrition:  Target Goals: Understanding of nutrition guidelines, daily intake of sodium <1549m, cholesterol <2045m calories 30% from fat and 7% or less from saturated fats, daily to  have 5 or more servings of fruits and vegetables.  Biometrics: Pre Biometrics - 03/14/18 1323      Pre Biometrics   Height  5' 3.25" (1.607 m)    Weight  206 lb 12.8 oz (93.8 kg)    Waist Circumference  40.5 inches    Hip Circumference  49 inches    Waist to Hip Ratio  0.83 %    BMI (Calculated)  36.32    Single Leg Stand  2.5 seconds        Nutrition Therapy Plan and Nutrition Goals: Nutrition Therapy & Goals - 03/14/18  1153      Personal Nutrition Goals   Nutrition Goal  weight loss    Personal Goal #2  Meet with the dietician      Intervention Plan   Intervention  Prescribe, educate and counsel regarding individualized specific dietary modifications aiming towards targeted core components such as weight, hypertension, lipid management, diabetes, heart failure and other comorbidities.;Nutrition handout(s) given to patient.    Expected Outcomes  Short Term Goal: Understand basic principles of dietary content, such as calories, fat, sodium, cholesterol and nutrients.;Long Term Goal: Adherence to prescribed nutrition plan.       Nutrition Assessments: Nutrition Assessments - 03/14/18 1225      MEDFICTS Scores   Pre Score  6       Nutrition Goals Re-Evaluation:   Nutrition Goals Discharge (Final Nutrition Goals Re-Evaluation):   Psychosocial: Target Goals: Acknowledge presence or absence of significant depression and/or stress, maximize coping skills, provide positive support system. Participant is able to verbalize types and ability to use techniques and skills needed for reducing stress and depression.   Initial Review & Psychosocial Screening: Initial Psych Review & Screening - 03/14/18 1151      Initial Review   Current issues with  History of Depression;Current Stress Concerns    Source of Stress Concerns  Chronic Illness    Comments  Her illness was making her depressed and has since brought herself out of it. She thought of her mom and it made her want to get better.       Family Dynamics   Good Support System?  Yes    Comments  She can look to her husband and her kids.      Barriers   Psychosocial barriers to participate in program  The patient should benefit from training in stress management and relaxation.      Screening Interventions   Interventions  Program counselor consult;Encouraged to exercise;To provide support and resources with identified psychosocial needs;Provide  feedback about the scores to participant    Expected Outcomes  Short Term goal: Utilizing psychosocial counselor, staff and physician to assist with identification of specific Stressors or current issues interfering with healing process. Setting desired goal for each stressor or current issue identified.;Long Term Goal: Stressors or current issues are controlled or eliminated.;Short Term goal: Identification and review with participant of any Quality of Life or Depression concerns found by scoring the questionnaire.;Long Term goal: The participant improves quality of Life and PHQ9 Scores as seen by post scores and/or verbalization of changes       Quality of Life Scores:  Scores of 19 and below usually indicate a poorer quality of life in these areas.  A difference of  2-3 points is a clinically meaningful difference.  A difference of 2-3 points in the total score of the Quality of Life Index has been associated with significant improvement in overall quality of  life, self-image, physical symptoms, and general health in studies assessing change in quality of life.  PHQ-9: Recent Review Flowsheet Data    Depression screen [facility] 2/9 04/05/2018 03/20/2018 03/14/2018 12/12/2017   Decreased Interest _0 0   Down, Depressed, Hopeless 0 2 1 0   PHQ - 2 Score _1 0   Altered sleeping _2 -   Tired, decreased energy 0 3 3 -   Change in appetite 0 2 2 -   Feeling bad or failure about yourself  0 0 1 -   Trouble concentrating _3 -   Moving slowly or fidgety/restless 0 0 0 -   Suicidal thoughts 0 0 0 -   PHQ-9 Score _4 -   Difficult doing work/chores Not difficult at all Somewhat difficult Somewhat difficult -     Interpretation of Total Score  Total Score Depression Severity:  1-4 = Minimal depression, 5-9 = Mild depression, 10-14 = Moderate depression, 15-19 = Moderately severe depression, 20-27 = Severe depression   Psychosocial Evaluation and Intervention: Psychosocial Evaluation -  03/20/18 1153      Psychosocial Evaluation & Interventions   Interventions  Stress management education;Encouraged to exercise with the program and follow exercise prescription    Comments  Counselor met with Ms. Ivan Croft Dewaine Oats) today for initial psychosocial evaluation.  She is a 63 year old who suffers with both COPD and Congestive Heart Failure.  Ramon has a strong support system with a spouse of 24 years and (4) adult children - (3) who live locally.  She is also actively involved in her local church.  Raymona also is challenged with other health issues such as diabetes, HBP and high cholesterol.  She reports sleeping better recently (after training a new puppy to be quiet at night) and her appetite is adjusting to a new diet that she is on - and is being effective with some weight loss currently reported.  Britanie admits to a history of depression following the death of her mother approximately 3 years ago.  She has recovered and reports currently being in a positive mood most of the time with minimal stress other than her health conditions.  She has goals to lose weight; eat healthier; and stabilize her blood sugars while in this program.  Staff will be following with Dewaine Oats throughout the course of this program.     Expected Outcomes  Short:  Hydia will meet with the dietician to address her dietary and weight loss goals.  She will exercise consistently to help her health and mental health - stress management with all of her medical conditions.  Long:  Jakki will develop healthy lifestyle habits of diet and exercise for her health and mental health.    Continue Psychosocial Services   Follow up required by staff       Psychosocial Re-Evaluation: Psychosocial Re-Evaluation    [place] Name 04/05/18 1357             Psychosocial Re-Evaluation   Current issues with  History of Depression       Comments  Reviewed patient health questionnaire (PHQ-9) with patient for follow up. Previously,  patients score indicated signs/symptoms of depression.  Reviewed to see if patient is improving symptom wise while in program.  Score improved and patient states that it is because she has more energy and being around more people in rehab has elevated her mood.       Expected Outcomes  Short: Continue to attend [facility] regularly for regular exercise and social engagement. Long: Continue to improve symptoms and manage a positive mental state.       Interventions  Encouraged to attend Pulmonary Rehabilitation for the exercise       Continue Psychosocial Services   Follow up required by counselor          Psychosocial Discharge (Final Psychosocial Re-Evaluation): Psychosocial Re-Evaluation - 04/05/18 1357      Psychosocial Re-Evaluation   Current issues with  History of Depression    Comments  Reviewed patient health questionnaire (PHQ-9) with patient for follow up. Previously, patients score indicated signs/symptoms of depression.  Reviewed to see if patient is improving symptom wise while in program.  Score improved and patient states that it is because she has more energy and being around more people in rehab has elevated her mood.    Expected Outcomes  Short: Continue to attend [facility] regularly for regular exercise and social engagement. Long: Continue to improve symptoms and manage a positive mental state.    Interventions  Encouraged to attend Pulmonary Rehabilitation for the exercise    Continue Psychosocial Services   Follow up required by counselor       Education: Education Goals: Education classes will be provided on a weekly basis, covering required topics. Participant will state understanding/return demonstration of topics presented.  Learning Barriers/Preferences: Learning Barriers/Preferences - 03/14/18 1155      Learning Barriers/Preferences   Learning Barriers  Sight   wears glasses   Learning Preferences  None       Education Topics:  Initial Evaluation  Education: - Verbal, written and demonstration of respiratory meds, oximetry and breathing techniques. Instruction on use of nebulizers and MDIs and importance of monitoring MDI activations.   Pulmonary Rehab from 04/12/2018 in [facility] Cardiac and Pulmonary Rehab  Date  03/14/18  Educator  [facility]  Instruction Review Code  1- Verbalizes Understanding      General Nutrition Guidelines/Fats and Fiber: -Group instruction provided by verbal, written material, models and posters to present the general guidelines for heart healthy nutrition. Gives an explanation and review of dietary fats and fiber.   Pulmonary Rehab from 04/12/2018 in [facility] Cardiac and Pulmonary Rehab  Date  04/05/18  Educator  [facility]  Instruction Review Code  1- Verbalizes Understanding      Controlling Sodium/Reading Food Labels: -Group verbal and written material supporting the discussion of sodium use in heart healthy nutrition. Review and explanation with models, verbal and written materials for utilization of the food label.   Pulmonary Rehab from 04/12/2018 in [facility] Cardiac and Pulmonary Rehab  Date  04/12/18  Educator  [facility]  Instruction Review Code  1- Verbalizes Understanding      Exercise Physiology & General Exercise Guidelines: - Group verbal and written instruction with models to review the exercise physiology of the cardiovascular system and associated critical values. Provides general exercise guidelines with specific guidelines to those with heart or lung disease.    Aerobic Exercise & Resistance Training: - Gives group verbal and written instruction on the various components of exercise. Focuses on aerobic and resistive training programs and the benefits of this training and how to safely progress through these programs.   Flexibility, Balance, Mind/Body Relaxation: Provides group verbal/written instruction on the benefits of flexibility and balance training, including mind/body exercise modes such as yoga, pilates and  tai chi.  Demonstration and skill practice provided.   Pulmonary Rehab from 04/12/2018 in [facility] Cardiac and Pulmonary  Rehab  Date  03/22/18  Educator  [facility]  Instruction Review Code  1- Verbalizes Understanding      Stress and Anxiety: - Provides group verbal and written instruction about the health risks of elevated stress and causes of high stress.  Discuss the correlation between heart/lung disease and anxiety and treatment options. Review healthy ways to manage with stress and anxiety.   Pulmonary Rehab from 04/12/2018 in [facility] Cardiac and Pulmonary Rehab  Date  03/29/18  Educator  [facility]  Instruction Review Code  1- Verbalizes Understanding      Depression: - Provides group verbal and written instruction on the correlation between heart/lung disease and depressed mood, treatment options, and the stigmas associated with seeking treatment.   Exercise & Equipment Safety: - Individual verbal instruction and demonstration of equipment use and safety with use of the equipment.   Pulmonary Rehab from 04/12/2018 in [facility] Cardiac and Pulmonary Rehab  Date  03/14/18  Educator  [facility]  Instruction Review Code  1- Verbalizes Understanding      Infection Prevention: - Provides verbal and written material to individual with discussion of infection control including proper hand washing and proper equipment cleaning during exercise session.   Pulmonary Rehab from 04/12/2018 in [facility] Cardiac and Pulmonary Rehab  Date  03/14/18  Educator  [facility]  Instruction Review Code  1- Verbalizes Understanding      Falls Prevention: - Provides verbal and written material to individual with discussion of falls prevention and safety.   Pulmonary Rehab from 04/12/2018 in [facility] Cardiac and Pulmonary Rehab  Date  03/14/18  Educator  [facility]  Instruction Review Code  1- Verbalizes Understanding      Diabetes: - Individual verbal and written instruction to review signs/symptoms of diabetes, desired ranges of glucose level  fasting, after meals and with exercise. Advice that pre and post exercise glucose checks will be done for 3 sessions at entry of program.   Pulmonary Rehab from 04/12/2018 in [facility] Cardiac and Pulmonary Rehab  Date  03/14/18  Educator  [facility]  Instruction Review Code  1- Verbalizes Understanding      Chronic Lung Diseases: - Group verbal and written instruction to review updates, respiratory medications, advancements in procedures and treatments. Discuss use of supplemental oxygen including available portable oxygen systems, continuous and intermittent flow rates, concentrators, personal use and safety guidelines. Review proper use of inhaler and spacers. Provide informative websites for self-education.    Pulmonary Rehab from 04/12/2018 in [facility] Cardiac and Pulmonary Rehab  Date  03/31/18  Educator  [facility]  Instruction Review Code  1- Verbalizes Understanding      Energy Conservation: - Provide group verbal and written instruction for methods to conserve energy, plan and organize activities. Instruct on pacing techniques, use of adaptive equipment and posture/positioning to relieve shortness of breath.   Triggers and Exacerbations: - Group verbal and written instruction to review types of environmental triggers and ways to prevent exacerbations. Discuss weather changes, air quality and the benefits of nasal washing. Review warning signs and symptoms to help prevent infections. Discuss techniques for effective airway clearance, coughing, and vibrations.   AED/CPR: - Group verbal and written instruction with the use of models to demonstrate the basic use of the AED with the basic ABC's of resuscitation.   Anatomy and Physiology of the Lungs: - Group verbal and written instruction with the use of models to provide basic lung anatomy and physiology related to function, structure and complications of lung disease.   Anatomy &  Physiology of the Heart: - Group verbal and written instruction and  models provide basic cardiac anatomy and physiology, with the coronary electrical and arterial systems. Review of Valvular disease and Heart Failure   Cardiac Medications: - Group verbal and written instruction to review commonly prescribed medications for heart disease. Reviews the medication, class of the drug, and side effects.   Know Your Numbers and Risk Factors: -Group verbal and written instruction about important numbers in your health.  Discussion of what are risk factors and how they play a role in the disease process.  Review of Cholesterol, Blood Pressure, Diabetes, and BMI and the role they play in your overall health.   Sleep Hygiene: -Provides group verbal and written instruction about how sleep can affect your health.  Define sleep hygiene, discuss sleep cycles and impact of sleep habits. Review good sleep hygiene tips.    Other: -Provides group and verbal instruction on various topics (see comments)    Knowledge Questionnaire Score: Knowledge Questionnaire Score - 03/14/18 1155      Knowledge Questionnaire Score   Pre Score  15/18   reviewed with patient       Core Components/Risk Factors/Patient Goals at Admission: Personal Goals and Risk Factors at Admission - 03/14/18 1156      Core Components/Risk Factors/Patient Goals on Admission    Weight Management  Yes;Weight Loss;Obesity    Intervention  Weight Management: Develop a combined nutrition and exercise program designed to reach desired caloric intake, while maintaining appropriate intake of nutrient and fiber, sodium and fats, and appropriate energy expenditure required for the weight goal.;Weight Management: Provide education and appropriate resources to help participant work on and attain dietary goals.;Weight Management/Obesity: Establish reasonable short term and long term weight goals.    Admit Weight  206 lb 12.8 oz (93.8 kg)    Goal Weight: Short Term  201 lb (91.2 kg)    Goal Weight: Long Term  160  lb (72.6 kg)    Expected Outcomes  Short Term: Continue to assess and modify interventions until short term weight is achieved;Long Term: Adherence to nutrition and physical activity/exercise program aimed toward attainment of established weight goal;Weight Maintenance: Understanding of the daily nutrition guidelines, which includes 25-35% calories from fat, 7% or less cal from saturated fats, less than 220m cholesterol, less than 1.5gm of sodium, & 5 or more servings of fruits and vegetables daily;Weight Loss: Understanding of general recommendations for a balanced deficit meal plan, which promotes 1-2 lb weight loss per week and includes a negative energy balance of [phone] kcal/d    Improve shortness of breath with ADL's  Yes    Intervention  Provide education, individualized exercise plan and daily activity instruction to help decrease symptoms of SOB with activities of daily living.    Expected Outcomes  Short Term: Improve cardiorespiratory fitness to achieve a reduction of symptoms when performing ADLs;Long Term: Be able to perform more ADLs without symptoms or delay the onset of symptoms    Diabetes  Yes    Intervention  Provide education about signs/symptoms and action to take for hypo/hyperglycemia.;Provide education about proper nutrition, including hydration, and aerobic/resistive exercise prescription along with prescribed medications to achieve blood glucose in normal ranges: Fasting glucose 65-99 mg/dL    Expected Outcomes  Short Term: Participant verbalizes understanding of the signs/symptoms and immediate care of hyper/hypoglycemia, proper foot care and importance of medication, aerobic/resistive exercise and nutrition plan for blood glucose control.;Long Term: Attainment of HbA1C < 7%.  Heart Failure  Yes    Intervention  Provide a combined exercise and nutrition program that is supplemented with education, support and counseling about heart failure. Directed toward relieving symptoms  such as shortness of breath, decreased exercise tolerance, and extremity edema.    Expected Outcomes  Improve functional capacity of life;Short term: Attendance in program 2-3 days a week with increased exercise capacity. Reported lower sodium intake. Reported increased fruit and vegetable intake. Reports medication compliance.;Short term: Daily weights obtained and reported for increase. Utilizing diuretic protocols set by physician.;Long term: Adoption of self-care skills and reduction of barriers for early signs and symptoms recognition and intervention leading to self-care maintenance.    Hypertension  Yes    Intervention  Provide education on lifestyle modifcations including regular physical activity/exercise, weight management, moderate sodium restriction and increased consumption of fresh fruit, vegetables, and low fat dairy, alcohol moderation, and smoking cessation.;Monitor prescription use compliance.    Expected Outcomes  Short Term: Continued assessment and intervention until BP is < 140/19m HG in hypertensive participants. < 130/873mHG in hypertensive participants with diabetes, heart failure or chronic kidney disease.;Long Term: Maintenance of blood pressure at goal levels.    Lipids  Yes    Intervention  Provide education and support for participant on nutrition & aerobic/resistive exercise along with prescribed medications to achieve LDL <7067mHDL >45m65m  Expected Outcomes  Short Term: Participant states understanding of desired cholesterol values and is compliant with medications prescribed. Participant is following exercise prescription and nutrition guidelines.;Long Term: Cholesterol controlled with medications as prescribed, with individualized exercise RX and with personalized nutrition plan. Value goals: LDL < 70mg61mL > 40 mg.       Core Components/Risk Factors/Patient Goals Review:    Core Components/Risk Factors/Patient Goals at Discharge (Final Review):    ITP  Comments: ITP Comments    Row Name 03/14/18 1220 04/03/18 0823 04/27/18 1248 05/01/18 0810     ITP Comments  Medical Evaluation completed. Chart sent for review and changes to Dr. Mark Emily Filbertctor of [facility] be found [place] [facility] encounter 02/13/2018  30 day review completed. ITP sent to Dr. Mark Emily Filbertctor of [facility] [place] ITP unless changes are made by physician.  LM on VM for patient to call back to Pulmonary Rehab.  Patient has been out due to being sick. Unable to obtain all goals. 30 day review completed. ITP sent to Dr. Mark Emily Filbertctor of [facility] [place] with ITP unless changes are made by physician.       Comments: 30 day review

## 2018-05-03 ENCOUNTER — Other Ambulatory Visit: Payer: Self-pay

## 2018-05-03 DIAGNOSIS — I110 Hypertensive heart disease with heart failure: Secondary | ICD-10-CM | POA: Diagnosis not present

## 2018-05-03 DIAGNOSIS — I5042 Chronic combined systolic (congestive) and diastolic (congestive) heart failure: Secondary | ICD-10-CM

## 2018-05-03 NOTE — Progress Notes (Signed)
Daily Session Note  Patient Details  Name: Brenda Wu MRN: [id number] Date of Birth: 1956/02/12 Referring Provider:     Pulmonary Rehab from 03/14/2018 in [facility] Cardiac and Pulmonary Rehab  Referring Provider  [place]      Encounter Date: 05/03/2018  Check In: Session Check In - 05/03/18 1000      Check-In   Supervising physician immediately available to respond to emergencies  [facility] immediately available ER MD    Physician(s)  Drs. Glena Norfolk    Location  [facility]-Cardiac & Pulmonary Rehab    Staff Present  Alberteen Sam, MA, RCEP, CCRP, Exercise Physiologist;Joseph Foy [place], [place], ACSM CEP, Exercise Physiologist    Medication changes reported      No    Fall or balance concerns reported     No    Warm-up and Cool-down  Performed as group-led instruction    Resistance Training Performed  Yes    VAD Patient?  No    PAD/SET Patient?  No      Pain Assessment   Currently in Pain?  No/denies          Social History   Tobacco Use  Smoking Status Former Smoker  . Packs/day: 1.00  . Years: 30.00  . Pack years: 30.00  . Types: Cigarettes  . Last attempt to quit: 02/22/2002  . Years since quitting: 16.2  Smokeless Tobacco Never Used    Goals Met:  Proper associated with RPD/PD & O2 Sat Independence with exercise equipment Using PLB without cueing & demonstrates good technique Exercise tolerated well No report of cardiac concerns or symptoms Strength training completed today  Goals Unmet:  Not Applicable  Comments: Pt able to follow exercise prescription today without complaint.  Will continue to monitor for progression.    Dr. Emily Filbert is Medical Director for [place] and [facility]monary [facility].

## 2018-05-08 NOTE — Progress Notes (Signed)
Patient ID: Rasheika Madyun, female    DOB: 03-23-1955, 63 y.o.   MRN: [id number]  HPI  Ms Bundy is a 63 y/o female with a history of DM, hyperlipidemia, , stroke, depression, HTN, seizures, COPD, previous tobacco use and chronic heart failure.   Echo report from 01/06/18 reviewed and showed an EF of 40% along with mild MR. Echo report from 11/05/17 reviewed and showed an EF of 45-50% along with mild/moderate MR/ TR and normal PA pressure.   Catheterization done 01/09/18 showed normal coronary arteries and an EF of 45-50%.  Was in the ED 04/25/2018 due to diarrhea and dizziness. Gave some IV fluids due to dehydration. Abdominal CT was normal and she was released. Was in the ED 04/23/2018 due to viral gastroenteritis where she was treated and released. Admitted 01/05/18 due to COPD/HF exacerbation. Initially on bipap and then weaned down to home O2. Cardiology consult obtained. Lost ~ 3.6L of fluid. Elevated troponin thought to be due to demand ischemia. Discharged after 5 days. Admitted 11/23/17 due to COPD exacerbation along with HF. Started on bipap and symptoms improved. Given solu-medrol and IV lasix and then transitioned to oral medications. Discharged after 2 days.   She presents today for a follow-up visit with a chief complaint of a dry cough that's been present for a few months. She says that it's intermittent in nature. She has associated shoulder pain due to carrying her oxygen tank around. She denies any difficulty sleeping, dizziness, abdominal distention, palpitations, pedal edema, chest pain, shortness of breath, fatigue or weight gain. She has been having difficulty in affording her inhalers as well as her diabetes medication.   Past Medical History:  Diagnosis Date  . Chronic combined systolic (congestive) and diastolic (congestive) heart failure (HCC)    a. 10/2017 Echo: EF 45-50%, diff HK. Mild to mod MR, mildly dil LA. Mod dil RV w/ mildly reduced RV fxn.  [name] COPD (chronic  obstructive pulmonary disease) (HCC)   . Depression   . Diabetes mellitus without complication (HCC)   . Hypercholesteremia   . Hypertension   . Morbid obesity (HCC)   . NICM (nonischemic cardiomyopathy) (HCC)   . NSTEMI (non-ST elevated myocardial infarction) (HCC)    a. 12/2017 -->Cath: nl cors. EF 45-50%.  . Seizures (HCC)   . Stroke (HCC)   . Tricuspid regurgitation    a. 10/2017 Echo: Mild to mod TR.   Past Surgical History:  Procedure Laterality Date  . ABDOMINAL HYSTERECTOMY    . LEFT HEART CATH AND CORONARY ANGIOGRAPHY N/A 01/09/2018   Procedure: LEFT HEART CATH AND CORONARY ANGIOGRAPHY;  Surgeon: Iran Ouch, MD;  Location: [facility] INV[facility]IVE [facility];  Service: Cardiovascular;  Laterality: N/A;  . REPLACEMENT TOTAL KNEE     Family History  Problem Relation Age of Onset  . Hypertension Father   . Asthma Sister   . Heart murmur Sister   . Diabetes Sister    Social History   Tobacco Use  . Smoking status: Former Smoker    Packs/day: 1.00    Years: 30.00    Pack years: 30.00    Types: Cigarettes    Last attempt to quit: 02/22/2002    Years since quitting: 16.2  . Smokeless tobacco: Never Used  Substance Use Topics  . Alcohol use: No   Allergies  Allergen Reactions  . Metformin Other (See Comments)    Other reaction(s): Other (See Comments) Constipation, dry mouth, dizziness  . Bismuth Subsalicylate Rash  Pepto Bismol   Prior to Admission medications   Medication Sig Start Date End Date Taking? Authorizing Provider  albuterol (PROVENTIL) (2.5 MG/3ML) 0.083% nebulizer solution Inhale 2.5 mg into the lungs every 6 (six) hours as needed for wheezing or shortness of breath.  10/01/16  Yes [provider]  aspirin EC 81 MG tablet Take 1 tablet (81 mg total) by mouth daily. 02/13/18  Yes [name], MD  atorvastatin (LIPITOR) 40 MG tablet Take 40 mg by mouth every evening.    Yes [provider]  budesonide-formoterol (SYMBICORT) 160-4.5  MCG/ACT inhaler Inhale 2 puffs into the lungs 2 (two) times daily. 05/22/15  Yes [provider]  diltiazem (CARDIZEM CD) 180 MG 24 hr capsule Take 180 mg by mouth daily.   Yes [provider]  ferrous sulfate 325 (65 FE) MG tablet Take 325 mg by mouth daily with breakfast.   Yes [provider]  glipiZIDE (GLUCOTROL) 10 MG tablet Take 10 mg by mouth 2 (two) times daily before a meal.    Yes [provider]  guaiFENesin (MUCINEX) 600 MG 12 hr tablet Take 600 mg by mouth 2 (two) times daily as needed.    Yes [provider]  Insulin Glargine (LANTUS SOLOSTAR) 100 UNIT/ML Solostar Pen Inject 15 Units into the skin daily.  01/21/17  Yes [provider]  lamoTRIgine (LAMICTAL) 200 MG tablet Take 200 mg by mouth 2 (two) times daily.    Yes [provider]  losartan (COZAAR) 50 MG tablet Take 50 mg by mouth daily.   Yes [provider]  Multiple Vitamin (MULTI-VITAMINS) TABS Take 1 tablet by mouth daily.   Yes [provider]  niacin 500 MG tablet Take 500 mg by mouth daily.   Yes [provider]  potassium chloride SA (K-DUR,KLOR-CON) 20 MEQ tablet Take 1 tablet (20 mEq total) by mouth daily. 02/12/17  Yes [name], MD  pregabalin (LYRICA) 75 MG capsule Take 75 mg by mouth 2 (two) times daily.   Yes [provider]  senna (SENOKOT) 8.6 MG TABS tablet Take 1 tablet by mouth daily.    Yes [provider]  tiotropium (SPIRIVA) 18 MCG inhalation capsule Place 18 mcg into inhaler and inhale daily.   Yes [provider]  topiramate (TOPAMAX) 200 MG tablet Take 200 mg by mouth 2 (two) times daily.   Yes [provider]  torsemide (DEMADEX) 20 MG tablet Take 2 tablets (40 mg total) by mouth daily. 01/26/18  Yes Creig Hines, NP  vitamin B-12 (CYANOCOBALAMIN) 500 MCG tablet Take 500 mcg by mouth daily.   Yes [provider]  albuterol (PROVENTIL HFA;VENTOLIN  HFA) 108 (90 Base) MCG/ACT inhaler Inhale 2 puffs into the lungs 4 (four) times daily as needed for wheezing or shortness of breath.  01/05/16   [provider]  sitaGLIPtin (JANUVIA) 50 MG tablet Take 50 mg by mouth daily.    [provider]    Review of Systems  Constitutional: Negative for appetite change, fatigue and fever.  HENT: Negative for congestion, postnasal drip and sore throat.   Eyes: Negative.   Respiratory: Positive for cough (dry). Negative for shortness of breath and wheezing.   Cardiovascular: Negative for chest pain, palpitations and leg swelling.  Gastrointestinal: Negative for abdominal distention and abdominal pain.  Endocrine: Negative.   Genitourinary: Negative.   Musculoskeletal: Positive for arthralgias (left shoulder). Negative for back pain and neck pain.  Skin: Negative.   Allergic/Immunologic:  Negative.   Neurological: Negative for dizziness and light-headedness.  Hematological: Negative for adenopathy. Does not bruise/bleed easily.  Psychiatric/Behavioral: Negative for dysphoric mood and sleep disturbance (wearing oxygen @2L  and bipap). The patient is not nervous/anxious.    Vitals:   05/09/18 0952  BP: (!) 105/58  Pulse: 90  Resp: 18  SpO2: 97%  Weight: 206 lb 2 oz (93.5 kg)  Height: 5\' 4"  (1.626 m)   Wt Readings from Last 3 Encounters:  05/09/18 206 lb 2 oz (93.5 kg)  04/25/18 197 lb 15.6 oz (89.8 kg)  04/23/18 198 lb (89.8 kg)   Lab Results  Component Value Date   CREATININE 2.07 (H) 04/25/2018   CREATININE 1.10 (H) 02/02/2018   CREATININE 0.99 01/26/2018    Physical Exam Vitals signs and nursing note reviewed.  Constitutional:      Appearance: She is well-developed.  HENT:     Head: Normocephalic and atraumatic.  Neck:     Musculoskeletal: Normal range of motion and neck supple.     Vascular: No JVD.  Cardiovascular:     Rate and Rhythm: Normal rate and regular rhythm.  Pulmonary:     Effort: Pulmonary effort  is normal. No respiratory distress.     Breath sounds: No wheezing or rales.  Abdominal:     General: There is no distension.     Palpations: Abdomen is soft.  Musculoskeletal:     Right lower leg: She exhibits no tenderness. No edema.     Left lower leg: She exhibits no tenderness. No edema.  Skin:    General: Skin is warm and dry.  Neurological:     Mental Status: She is oriented to person, place, and time.  Psychiatric:        Mood and Affect: Mood is not anxious.        Behavior: Behavior is not agitated.     Assessment & Plan:  1: Chronic heart failure with mildly reduced ejection fraction- - NYHA class I - euvolemic today - weighing daily and she says that her weight has declined Reviewed the importance of calling for an overnight weight gain of >2 pounds or a weekly weight gain of >5 pounds - weight down 14 pounds from last visit here 3 months ago - had been participating in pulmonary rehab which is now closed for the next 2 weeks - not adding salt to her food. Reviewed the importance of closely following a 2000mg  sodium diet  - saw cardiology Mariah Milling) 02/13/18 - EF mildly reduced so would not qualify for entresto - she says that she's received her flu vaccine already - BNP 01/06/18 was 816.0 - PharmD reconciled medications with patient  2: HTN- - BP looks good today although on the low side - BMP done 04/25/2018 reviewed and showed sodium 136, potassium 3.6, creatinine 2.07 and GFR 29 - saw PCP Alwyn Ren) 01/17/18  3: DM-  - glucose this morning at home was 97 - A1c 01/05/18 was 7.4%  4: COPD- - wearing oxygen at 2L around the clock although sometimes takes it off at home - wearing bipap at night - saw pulmonologist Su Monks) 09/20/17  Medication list was reviewed.   Return in 3 months or sooner for any questions/problems before then.

## 2018-05-09 ENCOUNTER — Encounter: Payer: Self-pay | Admitting: Pharmacist

## 2018-05-09 ENCOUNTER — Ambulatory Visit: Payer: Medicare HMO | Admitting: Family

## 2018-05-09 ENCOUNTER — Encounter: Payer: Self-pay | Admitting: Family

## 2018-05-09 ENCOUNTER — Other Ambulatory Visit: Payer: Self-pay

## 2018-05-09 VITALS — BP 105/58 | HR 90 | Resp 18 | Ht 64.0 in | Wt 206.1 lb

## 2018-05-09 DIAGNOSIS — D62 Acute posthemorrhagic anemia: Secondary | ICD-10-CM | POA: Diagnosis not present

## 2018-05-09 DIAGNOSIS — I5042 Chronic combined systolic (congestive) and diastolic (congestive) heart failure: Secondary | ICD-10-CM

## 2018-05-09 DIAGNOSIS — I10 Essential (primary) hypertension: Secondary | ICD-10-CM

## 2018-05-09 DIAGNOSIS — Y92230 Patient room in hospital as the place of occurrence of the external cause: Secondary | ICD-10-CM | POA: Diagnosis not present

## 2018-05-09 DIAGNOSIS — Z8673 Personal history of transient ischemic attack (TIA), and cerebral infarction without residual deficits: Secondary | ICD-10-CM

## 2018-05-09 DIAGNOSIS — R569 Unspecified convulsions: Secondary | ICD-10-CM | POA: Insufficient documentation

## 2018-05-09 DIAGNOSIS — I252 Old myocardial infarction: Secondary | ICD-10-CM

## 2018-05-09 DIAGNOSIS — M9712XA Periprosthetic fracture around internal prosthetic left knee joint, initial encounter: Secondary | ICD-10-CM | POA: Diagnosis present

## 2018-05-09 DIAGNOSIS — F419 Anxiety disorder, unspecified: Secondary | ICD-10-CM | POA: Diagnosis present

## 2018-05-09 DIAGNOSIS — I110 Hypertensive heart disease with heart failure: Secondary | ICD-10-CM | POA: Insufficient documentation

## 2018-05-09 DIAGNOSIS — J449 Chronic obstructive pulmonary disease, unspecified: Secondary | ICD-10-CM

## 2018-05-09 DIAGNOSIS — I952 Hypotension due to drugs: Secondary | ICD-10-CM | POA: Diagnosis not present

## 2018-05-09 DIAGNOSIS — Z79899 Other long term (current) drug therapy: Secondary | ICD-10-CM | POA: Insufficient documentation

## 2018-05-09 DIAGNOSIS — E119 Type 2 diabetes mellitus without complications: Secondary | ICD-10-CM | POA: Insufficient documentation

## 2018-05-09 DIAGNOSIS — Z7982 Long term (current) use of aspirin: Secondary | ICD-10-CM

## 2018-05-09 DIAGNOSIS — Z6835 Body mass index (BMI) 35.0-35.9, adult: Secondary | ICD-10-CM

## 2018-05-09 DIAGNOSIS — Z833 Family history of diabetes mellitus: Secondary | ICD-10-CM

## 2018-05-09 DIAGNOSIS — Z9981 Dependence on supplemental oxygen: Secondary | ICD-10-CM

## 2018-05-09 DIAGNOSIS — E785 Hyperlipidemia, unspecified: Secondary | ICD-10-CM | POA: Diagnosis present

## 2018-05-09 DIAGNOSIS — S72452A Displaced supracondylar fracture without intracondylar extension of lower end of left femur, initial encounter for closed fracture: Secondary | ICD-10-CM | POA: Diagnosis not present

## 2018-05-09 DIAGNOSIS — W19XXXA Unspecified fall, initial encounter: Secondary | ICD-10-CM | POA: Diagnosis present

## 2018-05-09 DIAGNOSIS — Z794 Long term (current) use of insulin: Secondary | ICD-10-CM | POA: Insufficient documentation

## 2018-05-09 DIAGNOSIS — Z8249 Family history of ischemic heart disease and other diseases of the circulatory system: Secondary | ICD-10-CM

## 2018-05-09 DIAGNOSIS — Z825 Family history of asthma and other chronic lower respiratory diseases: Secondary | ICD-10-CM

## 2018-05-09 DIAGNOSIS — G4733 Obstructive sleep apnea (adult) (pediatric): Secondary | ICD-10-CM | POA: Diagnosis present

## 2018-05-09 DIAGNOSIS — I428 Other cardiomyopathies: Secondary | ICD-10-CM | POA: Insufficient documentation

## 2018-05-09 DIAGNOSIS — Z888 Allergy status to other drugs, medicaments and biological substances status: Secondary | ICD-10-CM | POA: Insufficient documentation

## 2018-05-09 DIAGNOSIS — Z90710 Acquired absence of both cervix and uterus: Secondary | ICD-10-CM

## 2018-05-09 DIAGNOSIS — E7800 Pure hypercholesterolemia, unspecified: Secondary | ICD-10-CM

## 2018-05-09 DIAGNOSIS — R52 Pain, unspecified: Secondary | ICD-10-CM | POA: Diagnosis not present

## 2018-05-09 DIAGNOSIS — Z7951 Long term (current) use of inhaled steroids: Secondary | ICD-10-CM

## 2018-05-09 DIAGNOSIS — Z87891 Personal history of nicotine dependence: Secondary | ICD-10-CM

## 2018-05-09 DIAGNOSIS — T402X5A Adverse effect of other opioids, initial encounter: Secondary | ICD-10-CM | POA: Diagnosis not present

## 2018-05-09 DIAGNOSIS — E876 Hypokalemia: Secondary | ICD-10-CM | POA: Diagnosis not present

## 2018-05-09 DIAGNOSIS — J9610 Chronic respiratory failure, unspecified whether with hypoxia or hypercapnia: Secondary | ICD-10-CM | POA: Diagnosis present

## 2018-05-09 DIAGNOSIS — E1169 Type 2 diabetes mellitus with other specified complication: Secondary | ICD-10-CM | POA: Diagnosis present

## 2018-05-09 DIAGNOSIS — Y92009 Unspecified place in unspecified non-institutional (private) residence as the place of occurrence of the external cause: Secondary | ICD-10-CM

## 2018-05-09 DIAGNOSIS — I480 Paroxysmal atrial fibrillation: Secondary | ICD-10-CM | POA: Diagnosis present

## 2018-05-09 DIAGNOSIS — N3000 Acute cystitis without hematuria: Secondary | ICD-10-CM | POA: Diagnosis present

## 2018-05-09 DIAGNOSIS — Z972 Presence of dental prosthetic device (complete) (partial): Secondary | ICD-10-CM

## 2018-05-09 DIAGNOSIS — Z96652 Presence of left artificial knee joint: Secondary | ICD-10-CM | POA: Diagnosis present

## 2018-05-09 NOTE — Patient Instructions (Signed)
Continue weighing daily and call for an overnight weight gain of > 2 pounds or a weekly weight gain of >5 pounds. 

## 2018-05-09 NOTE — Progress Notes (Signed)
[facility] - HEART FAILURE CLINIC - PHARMACIST COUNSELING NOTE  ADHERENCE ASSESSMENT  Adherence strategy: pill box   Do you ever forget to take your medication? [x] Yes (1) [] No (0)  Do you ever skip doses due to side effects? [] Yes (1) [x] No (0)  Do you have trouble affording your medicines? [x] Yes (1) [] No (0)  Are you ever unable to pick up your medication due to transportation difficulties? [] Yes (1) [x] No (0)  Do you ever stop taking your medications because you don't believe they are helping? [] Yes (1) [x] No (0)  Total score _2______    Recommendations given to patient about increasing adherence: Non-adherence with diabetes medications and inhalers secondary to cost. Patient encouraged to speak to Pulmonologist and PCP about these medications and see if there are alternatives that may be preferred by her insurance. If patient is in the donut hole, she may be able to receive additional help until she is out of the donut hole.  Guideline-Directed Medical Therapy/Evidence Based Medicine    ACE/ARB/ARNI: losartan 50 mg daily   Beta Blocker: none    Aldosterone Antagonist: none Diuretic: torsemide 40 mg daily    SUBJECTIVE   HPI: Here for follow up visit. Denies swelling or shortness of breath.  Past Medical History:  Diagnosis Date  . Chronic combined systolic (congestive) and diastolic (congestive) heart failure (HCC)    a. 10/2017 Echo: EF 45-50%, diff HK. Mild to mod MR, mildly dil LA. Mod dil RV w/ mildly reduced RV fxn.  [name] COPD (chronic obstructive pulmonary disease) (HCC)   . Depression   . Diabetes mellitus without complication (HCC)   . Hypercholesteremia   . Hypertension   . Morbid obesity (HCC)   . NICM (nonischemic cardiomyopathy) (HCC)   . NSTEMI (non-ST elevated myocardial infarction) (HCC)    a. 12/2017 -->Cath: nl cors. EF 45-50%.  . Seizures (HCC)   . Stroke (HCC)   . Tricuspid regurgitation    a. 10/2017 Echo: Mild to mod TR.         OBJECTIVE    Vital signs: HR 90, BP 105/58, weight (pounds) 206.2  ECHO: Date 01/06/18, EF 40%   BMP Latest Ref Rng & Units 04/25/2018 02/02/2018 01/26/2018  Glucose 70 - 99 mg/dL 270(W) 237(S) 283(T)  BUN 8 - 23 mg/dL 51(V) 61(Y) 21  Creatinine 0.44 - 1.00 mg/dL 0.73(X) 1.06(Y) 6.94  BUN/Creat Ratio 12 - 28 - - 21  Sodium 135 - 145 mmol/L 136 141 145(H)  Potassium 3.5 - 5.1 mmol/L 3.6 3.6 3.9  Chloride 98 - 111 mmol/L 94(L) 102 103  CO2 22 - 32 mmol/L 30 31 26   Calcium 8.9 - 10.3 mg/dL 9.2 9.3 9.1    ASSESSMENT 63 year old female with HFmrEF. The large majority of my visit with her today was spent discussing her inhalers and diabetes medications which she can not afford. She has been out of Januvia for about 3 months, takes Lantus sporadically to conserve and still has her inhalers (although running low and when gone will not be able to afford). She has contacted her insurance company but they tell her there are no alternatives. She reports that the remainder of her medications are pretty cheap and she does not run out of those. Her BP today is on the lower end of normal.   PLAN I encouraged the patient to follow up with both her Pulmonologist and her PCP to see if they can work with her and her insurance company to come  up with possible options to help her and to keep her from having a lapse in therapy. From a HF perspective, further optimization of her regimen is limited by low BP. Consider titration at future visits as BP allows.    Time spent: 10 minutes  Toia Micale K Ginny Loomer, Pharm.D. 05/09/2018 10:29 AM    Current Outpatient Medications:  .  albuterol (PROVENTIL HFA;VENTOLIN HFA) 108 (90 Base) MCG/ACT inhaler, Inhale 2 puffs into the lungs 4 (four) times daily as needed for wheezing or shortness of breath. , Disp: , Rfl:  .  albuterol (PROVENTIL) (2.5 MG/3ML) 0.083% nebulizer solution, Inhale 2.5 mg into the lungs every 6 (six) hours as needed for wheezing or shortness of  breath. , Disp: , Rfl:  .  aspirin EC 81 MG tablet, Take 1 tablet (81 mg total) by mouth daily., Disp: 32 tablet, Rfl: 0 .  atorvastatin (LIPITOR) 40 MG tablet, Take 40 mg by mouth every evening. , Disp: , Rfl:  .  budesonide-formoterol (SYMBICORT) 160-4.5 MCG/ACT inhaler, Inhale 2 puffs into the lungs 2 (two) times daily., Disp: , Rfl:  .  diltiazem (CARDIZEM CD) 180 MG 24 hr capsule, Take 180 mg by mouth daily., Disp: , Rfl:  .  ferrous sulfate 325 (65 FE) MG tablet, Take 325 mg by mouth daily with breakfast., Disp: , Rfl:  .  glipiZIDE (GLUCOTROL) 10 MG tablet, Take 10 mg by mouth 2 (two) times daily before a meal. , Disp: , Rfl:  .  guaiFENesin (MUCINEX) 600 MG 12 hr tablet, Take 600 mg by mouth 2 (two) times daily as needed. , Disp: , Rfl:  .  Insulin Glargine (LANTUS SOLOSTAR) 100 UNIT/ML Solostar Pen, Inject 15 Units into the skin daily. , Disp: , Rfl:  .  lamoTRIgine (LAMICTAL) 200 MG tablet, Take 200 mg by mouth 2 (two) times daily. , Disp: , Rfl:  .  losartan (COZAAR) 50 MG tablet, Take 50 mg by mouth daily., Disp: , Rfl:  .  Multiple Vitamin (MULTI-VITAMINS) TABS, Take 1 tablet by mouth daily., Disp: , Rfl:  .  niacin 500 MG tablet, Take 500 mg by mouth daily., Disp: , Rfl:  .  ondansetron (ZOFRAN ODT) 8 MG disintegrating tablet, Take 1 tablet (8 mg total) by mouth every 8 (eight) hours as needed. (Patient not taking: Reported on 05/09/2018), Disp: 6 tablet, Rfl: 0 .  potassium chloride SA (K-DUR,KLOR-CON) 20 MEQ tablet, Take 1 tablet (20 mEq total) by mouth daily., Disp: 30 tablet, Rfl: 2 .  pregabalin (LYRICA) 75 MG capsule, Take 75 mg by mouth 2 (two) times daily., Disp: , Rfl:  .  senna (SENOKOT) 8.6 MG TABS tablet, Take 1 tablet by mouth daily. , Disp: , Rfl:  .  sitaGLIPtin (JANUVIA) 50 MG tablet, Take 50 mg by mouth daily., Disp: , Rfl:  .  tiotropium (SPIRIVA) 18 MCG inhalation capsule, Place 18 mcg into inhaler and inhale daily., Disp: , Rfl:  .  topiramate (TOPAMAX) 200 MG  tablet, Take 200 mg by mouth 2 (two) times daily., Disp: , Rfl:  .  torsemide (DEMADEX) 20 MG tablet, Take 2 tablets (40 mg total) by mouth daily., Disp: 90 tablet, Rfl: 0 .  vitamin B-12 (CYANOCOBALAMIN) 500 MCG tablet, Take 500 mcg by mouth daily., Disp: , Rfl:    COUNSELING POINTS/CLINICAL PEARLS  Losartan (Goal: 150 mg once daily)  Warn female patient to avoid pregnancy and to report a pregnancy that occurs during therapy.  Side effects may include dizziness, upper respiratory  infection, nasal congestion, and back pain.  Warn patient to avoid use of potassium supplements or potassium-containing salt substitutes unless they consult healthcare provider. Torsemide  Side effects may include excessive urination.  Tell patient to report symptoms of ototoxicity.  Instruct patient to report lightheadedness or syncope.  Warn patient to avoid use of nonprescription NSAID products without first discussing it with their healthcare provider.   DRUGS TO AVOID IN HEART FAILURE  Drug or Class Mechanism  Analgesics . NSAIDs . COX-2 inhibitors . Glucocorticoids  Sodium and water retention, increased systemic vascular resistance, decreased response to diuretics   Diabetes Medications . Metformin . Thiazolidinediones o Rosiglitazone (Avandia) o Pioglitazone (Actos) . DPP4 Inhibitors o Saxagliptin (Onglyza) o Sitagliptin (Januvia)   Lactic acidosis Possible calcium channel blockade   Unknown  Antiarrhythmics . Class I  o Flecainide o Disopyramide . Class III o Sotalol . Other o Dronedarone  Negative inotrope, proarrhythmic   Proarrhythmic, beta blockade  Negative inotrope  Antihypertensives . Alpha Blockers o Doxazosin . Calcium Channel Blockers o Diltiazem o Verapamil o Nifedipine . Central Alpha Adrenergics o Moxonidine . Peripheral Vasodilators o Minoxidil  Increases renin and aldosterone  Negative inotrope    Possible sympathetic withdrawal  Unknown   Anti-infective . Itraconazole . Amphotericin B  Negative inotrope Unknown  Hematologic . Anagrelide . Cilostazol   Possible inhibition of PD IV Inhibition of PD III causing arrhythmias  Neurologic/Psychiatric . Stimulants . Anti-Seizure Drugs o Carbamazepine o Pregabalin . Antidepressants o Tricyclics o Citalopram . Parkinsons o Bromocriptine o Pergolide o Pramipexole . Antipsychotics o Clozapine . Antimigraine o Ergotamine o Methysergide . Appetite suppressants . Bipolar o Lithium  Peripheral alpha and beta agonist activity  Negative inotrope and chronotrope Calcium channel blockade  Negative inotrope, proarrhythmic Dose-dependent QT prolongation  Excessive serotonin activity/valvular damage Excessive serotonin activity/valvular damage Unknown  IgE mediated hypersensitivy, calcium channel blockade  Excessive serotonin activity/valvular damage Excessive serotonin activity/valvular damage Valvular damage  Direct myofibrillar degeneration, adrenergic stimulation  Antimalarials . Chloroquine . Hydroxychloroquine Intracellular inhibition of lysosomal enzymes  Urologic Agents . Alpha Blockers o Doxazosin o Prazosin o Tamsulosin o Terazosin  Increased renin and aldosterone  Adapted from Page RL, et al. "Drugs That May Cause or Exacerbate Heart Failure: A Scientific Statement from the American Heart  Association." Circulation 2016; 134:e32-e69. DOI: 10.1161/CIR.0000000000000426   MEDICATION ADHERENCES TIPS AND STRATEGIES 1. Taking medication as prescribed improves patient outcomes in heart failure (reduces hospitalizations, improves symptoms, increases survival) 2. Side effects of medications can be managed by decreasing doses, switching agents, stopping drugs, or adding additional therapy. Please let someone in the Heart Failure Clinic know if you have having bothersome side effects so we can modify your regimen. Do not alter your medication regimen  without talking to [place].  3. Medication reminders can help patients remember to take drugs on time. If you are missing or forgetting doses you can try linking behaviors, using pill boxes, or an electronic reminder like an alarm on your phone or an app. Some people can also get automated phone calls as medication reminders.

## 2018-05-11 DIAGNOSIS — I5042 Chronic combined systolic (congestive) and diastolic (congestive) heart failure: Secondary | ICD-10-CM

## 2018-05-12 ENCOUNTER — Other Ambulatory Visit: Payer: Self-pay

## 2018-05-12 ENCOUNTER — Emergency Department: Payer: Medicare HMO

## 2018-05-12 ENCOUNTER — Encounter
Admission: EM | Disposition: A | Discharge status: Home Health | Payer: Self-pay | Source: Home / Self Care | Attending: Internal Medicine

## 2018-05-12 ENCOUNTER — Inpatient Hospital Stay: Payer: Medicare HMO

## 2018-05-12 ENCOUNTER — Inpatient Hospital Stay
Admission: EM | Admit: 2018-05-12 | Discharge: 2018-05-16 | DRG: 481 | Disposition: A | Discharge status: Home Health | Payer: Medicare HMO | Attending: Internal Medicine | Admitting: Internal Medicine

## 2018-05-12 ENCOUNTER — Inpatient Hospital Stay: Payer: Medicare HMO | Admitting: Anesthesiology

## 2018-05-12 ENCOUNTER — Encounter: Payer: Self-pay | Admitting: Emergency Medicine

## 2018-05-12 DIAGNOSIS — I480 Paroxysmal atrial fibrillation: Secondary | ICD-10-CM | POA: Diagnosis present

## 2018-05-12 DIAGNOSIS — R52 Pain, unspecified: Secondary | ICD-10-CM | POA: Diagnosis present

## 2018-05-12 DIAGNOSIS — W19XXXA Unspecified fall, initial encounter: Secondary | ICD-10-CM | POA: Diagnosis present

## 2018-05-12 DIAGNOSIS — G8918 Other acute postprocedural pain: Secondary | ICD-10-CM

## 2018-05-12 DIAGNOSIS — I5042 Chronic combined systolic (congestive) and diastolic (congestive) heart failure: Secondary | ICD-10-CM | POA: Diagnosis present

## 2018-05-12 DIAGNOSIS — T402X5A Adverse effect of other opioids, initial encounter: Secondary | ICD-10-CM | POA: Diagnosis not present

## 2018-05-12 DIAGNOSIS — R569 Unspecified convulsions: Secondary | ICD-10-CM | POA: Diagnosis present

## 2018-05-12 DIAGNOSIS — M9712XA Periprosthetic fracture around internal prosthetic left knee joint, initial encounter: Secondary | ICD-10-CM | POA: Diagnosis present

## 2018-05-12 DIAGNOSIS — S72351A Displaced comminuted fracture of shaft of right femur, initial encounter for closed fracture: Secondary | ICD-10-CM

## 2018-05-12 DIAGNOSIS — J9610 Chronic respiratory failure, unspecified whether with hypoxia or hypercapnia: Secondary | ICD-10-CM | POA: Diagnosis present

## 2018-05-12 DIAGNOSIS — E1169 Type 2 diabetes mellitus with other specified complication: Secondary | ICD-10-CM | POA: Diagnosis present

## 2018-05-12 DIAGNOSIS — Z419 Encounter for procedure for purposes other than remedying health state, unspecified: Secondary | ICD-10-CM

## 2018-05-12 DIAGNOSIS — I428 Other cardiomyopathies: Secondary | ICD-10-CM | POA: Diagnosis present

## 2018-05-12 DIAGNOSIS — T84019A Broken internal joint prosthesis, unspecified site, initial encounter: Secondary | ICD-10-CM

## 2018-05-12 DIAGNOSIS — D62 Acute posthemorrhagic anemia: Secondary | ICD-10-CM | POA: Diagnosis not present

## 2018-05-12 DIAGNOSIS — Z7982 Long term (current) use of aspirin: Secondary | ICD-10-CM | POA: Diagnosis not present

## 2018-05-12 DIAGNOSIS — Y92009 Unspecified place in unspecified non-institutional (private) residence as the place of occurrence of the external cause: Secondary | ICD-10-CM | POA: Diagnosis not present

## 2018-05-12 DIAGNOSIS — E785 Hyperlipidemia, unspecified: Secondary | ICD-10-CM | POA: Diagnosis present

## 2018-05-12 DIAGNOSIS — E876 Hypokalemia: Secondary | ICD-10-CM | POA: Diagnosis not present

## 2018-05-12 DIAGNOSIS — Z7951 Long term (current) use of inhaled steroids: Secondary | ICD-10-CM | POA: Diagnosis not present

## 2018-05-12 DIAGNOSIS — I952 Hypotension due to drugs: Secondary | ICD-10-CM | POA: Diagnosis not present

## 2018-05-12 DIAGNOSIS — I252 Old myocardial infarction: Secondary | ICD-10-CM | POA: Diagnosis not present

## 2018-05-12 DIAGNOSIS — G4733 Obstructive sleep apnea (adult) (pediatric): Secondary | ICD-10-CM | POA: Diagnosis present

## 2018-05-12 DIAGNOSIS — Z01811 Encounter for preprocedural respiratory examination: Secondary | ICD-10-CM

## 2018-05-12 DIAGNOSIS — J449 Chronic obstructive pulmonary disease, unspecified: Secondary | ICD-10-CM | POA: Diagnosis present

## 2018-05-12 DIAGNOSIS — I110 Hypertensive heart disease with heart failure: Secondary | ICD-10-CM | POA: Diagnosis present

## 2018-05-12 DIAGNOSIS — Z9981 Dependence on supplemental oxygen: Secondary | ICD-10-CM | POA: Diagnosis not present

## 2018-05-12 DIAGNOSIS — S72452A Displaced supracondylar fracture without intracondylar extension of lower end of left femur, initial encounter for closed fracture: Secondary | ICD-10-CM | POA: Diagnosis present

## 2018-05-12 DIAGNOSIS — F419 Anxiety disorder, unspecified: Secondary | ICD-10-CM | POA: Diagnosis present

## 2018-05-12 DIAGNOSIS — Z79899 Other long term (current) drug therapy: Secondary | ICD-10-CM | POA: Diagnosis not present

## 2018-05-12 DIAGNOSIS — Y92230 Patient room in hospital as the place of occurrence of the external cause: Secondary | ICD-10-CM | POA: Diagnosis not present

## 2018-05-12 DIAGNOSIS — Z794 Long term (current) use of insulin: Secondary | ICD-10-CM | POA: Diagnosis not present

## 2018-05-12 DIAGNOSIS — N3000 Acute cystitis without hematuria: Secondary | ICD-10-CM | POA: Diagnosis present

## 2018-05-12 HISTORY — PX: ORIF FEMUR FRACTURE: SHX2119

## 2018-05-12 LAB — GLUCOSE, CAPILLARY
GLUCOSE-CAPILLARY: 172 mg/dL — AB (ref 70–99)
Glucose-Capillary: 122 mg/dL — ABNORMAL HIGH (ref 70–99)
Glucose-Capillary: 137 mg/dL — ABNORMAL HIGH (ref 70–99)
Glucose-Capillary: 181 mg/dL — ABNORMAL HIGH (ref 70–99)

## 2018-05-12 LAB — COMPREHENSIVE METABOLIC PANEL
ALT: 17 U/L (ref 0–44)
AST: 31 U/L (ref 15–41)
Albumin: 3.8 g/dL (ref 3.5–5.0)
Alkaline Phosphatase: 73 U/L (ref 38–126)
Anion gap: 8 (ref 5–15)
BUN: 19 mg/dL (ref 8–23)
CO2: 27 mmol/L (ref 22–32)
Calcium: 8.9 mg/dL (ref 8.9–10.3)
Chloride: 105 mmol/L (ref 98–111)
Creatinine, Ser: 1.21 mg/dL — ABNORMAL HIGH (ref 0.44–1.00)
GFR calc Af Amer: 56 mL/min — ABNORMAL LOW (ref >60)
GFR calc non Af Amer: 48 mL/min — ABNORMAL LOW (ref >60)
Glucose, Bld: 209 mg/dL — ABNORMAL HIGH (ref 70–99)
POTASSIUM: 3.9 mmol/L (ref 3.5–5.1)
Sodium: 140 mmol/L (ref 135–145)
TOTAL PROTEIN: 6.7 g/dL (ref 6.5–8.1)
Total Bilirubin: 1 mg/dL (ref 0.3–1.2)

## 2018-05-12 LAB — CBC
HCT: 36.1 % (ref 36.0–46.0)
Hemoglobin: 11.1 g/dL — ABNORMAL LOW (ref 12.0–15.0)
MCH: 29.8 pg (ref 26.0–34.0)
MCHC: 30.7 g/dL (ref 30.0–36.0)
MCV: 97 fL (ref 80.0–100.0)
Platelets: 163 10*3/uL (ref 150–400)
RBC: 3.72 MIL/uL — ABNORMAL LOW (ref 3.87–5.11)
RDW: 16.4 % — AB (ref 11.5–15.5)
WBC: 13.9 10*3/uL — ABNORMAL HIGH (ref 4.0–10.5)
nRBC: 0 % (ref 0.0–0.2)

## 2018-05-12 LAB — MRSA PCR SCREENING: MRSA by PCR: NEGATIVE

## 2018-05-12 LAB — PROTIME-INR
INR: 1 (ref 0.8–1.2)
Prothrombin Time: 12.6 seconds (ref 11.4–15.2)

## 2018-05-12 SURGERY — OPEN REDUCTION INTERNAL FIXATION (ORIF) DISTAL FEMUR FRACTURE
Anesthesia: Spinal | Site: Leg Lower | Laterality: Left

## 2018-05-12 MED ORDER — HYDROCODONE-ACETAMINOPHEN 5-325 MG PO TABS
1.0000 | ORAL_TABLET | ORAL | Status: DC | PRN
  Administered 2018-05-12 – 2018-05-15 (×7): 1 via ORAL
  Filled 2018-05-12 (×6): qty 1
  Filled 2018-05-12: qty 2
  Filled 2018-05-12: qty 1
  Filled 2018-05-12: qty 2
  Filled 2018-05-12: qty 1

## 2018-05-12 MED ORDER — SODIUM CHLORIDE 0.9 % IV SOLN
INTRAVENOUS | Status: DC | PRN
  Administered 2018-05-12: 500 mL

## 2018-05-12 MED ORDER — MOMETASONE FURO-FORMOTEROL FUM 200-5 MCG/ACT IN AERO
2.0000 | INHALATION_SPRAY | Freq: Two times a day (BID) | RESPIRATORY_TRACT | Status: DC
  Administered 2018-05-12 – 2018-05-16 (×8): 2 via RESPIRATORY_TRACT
  Filled 2018-05-12: qty 8.8

## 2018-05-12 MED ORDER — CEFAZOLIN SODIUM-DEXTROSE 2-4 GM/100ML-% IV SOLN
INTRAVENOUS | Status: AC
  Filled 2018-05-12: qty 100

## 2018-05-12 MED ORDER — ONDANSETRON HCL 4 MG PO TABS: 4.0000 mg | ORAL_TABLET | Freq: Four times a day (QID) | ORAL | Status: DC | PRN

## 2018-05-12 MED ORDER — FENTANYL CITRATE (PF) 100 MCG/2ML IJ SOLN
INTRAMUSCULAR | Status: DC | PRN
  Administered 2018-05-12 (×2): 50 ug via INTRAVENOUS

## 2018-05-12 MED ORDER — BUPIVACAINE HCL (PF) 0.5 % IJ SOLN
INTRAMUSCULAR | Status: DC | PRN
  Administered 2018-05-12: 2.8 mL via INTRATHECAL

## 2018-05-12 MED ORDER — ARTIFICIAL TEARS OPHTHALMIC OINT
TOPICAL_OINTMENT | OPHTHALMIC | Status: AC
  Filled 2018-05-12: qty 3.5

## 2018-05-12 MED ORDER — MIDAZOLAM HCL 2 MG/2ML IJ SOLN
INTRAMUSCULAR | Status: AC
  Filled 2018-05-12: qty 2

## 2018-05-12 MED ORDER — MAGNESIUM HYDROXIDE 400 MG/5ML PO SUSP
30.0000 mL | Freq: Every day | ORAL | Status: DC | PRN
  Administered 2018-05-14: 30 mL via ORAL
  Filled 2018-05-12: qty 30

## 2018-05-12 MED ORDER — OXYCODONE HCL 5 MG/5ML PO SOLN: 5.0000 mg | Freq: Once | ORAL | Status: DC | PRN

## 2018-05-12 MED ORDER — MORPHINE SULFATE (PF) 2 MG/ML IV SOLN
2.0000 mg | Freq: Once | INTRAVENOUS | Status: AC
  Administered 2018-05-12: 2 mg via INTRAVENOUS

## 2018-05-12 MED ORDER — ONDANSETRON HCL 4 MG/2ML IJ SOLN: 4.0000 mg | Freq: Four times a day (QID) | INTRAMUSCULAR | Status: DC | PRN

## 2018-05-12 MED ORDER — SODIUM CHLORIDE 0.9 % IV SOLN
1.0000 g | INTRAVENOUS | Status: DC
  Administered 2018-05-13 – 2018-05-15 (×3): 1 g via INTRAVENOUS
  Filled 2018-05-12: qty 1
  Filled 2018-05-12: qty 10
  Filled 2018-05-12 (×2): qty 1

## 2018-05-12 MED ORDER — DOCUSATE SODIUM 100 MG PO CAPS
100.0000 mg | ORAL_CAPSULE | Freq: Two times a day (BID) | ORAL | Status: DC
  Administered 2018-05-12: 100 mg via ORAL

## 2018-05-12 MED ORDER — LIDOCAINE HCL (PF) 2 % IJ SOLN
INTRAMUSCULAR | Status: DC | PRN
  Administered 2018-05-12: 25 mg

## 2018-05-12 MED ORDER — FERROUS SULFATE 325 (65 FE) MG PO TABS
325.0000 mg | ORAL_TABLET | Freq: Every day | ORAL | Status: DC
  Administered 2018-05-13 – 2018-05-15 (×3): 325 mg via ORAL
  Filled 2018-05-12 (×3): qty 1

## 2018-05-12 MED ORDER — PROPOFOL 500 MG/50ML IV EMUL
INTRAVENOUS | Status: DC | PRN
  Administered 2018-05-12: 20 ug/kg/min via INTRAVENOUS

## 2018-05-12 MED ORDER — ASPIRIN EC 81 MG PO TBEC
81.0000 mg | DELAYED_RELEASE_TABLET | Freq: Every day | ORAL | Status: DC
  Administered 2018-05-13 – 2018-05-16 (×4): 81 mg via ORAL
  Filled 2018-05-12 (×5): qty 1

## 2018-05-12 MED ORDER — FENTANYL CITRATE (PF) 100 MCG/2ML IJ SOLN
INTRAMUSCULAR | Status: AC
  Filled 2018-05-12: qty 2

## 2018-05-12 MED ORDER — ALBUTEROL SULFATE (2.5 MG/3ML) 0.083% IN NEBU
2.5000 mg | INHALATION_SOLUTION | Freq: Four times a day (QID) | RESPIRATORY_TRACT | Status: DC | PRN
  Administered 2018-05-13: 2.5 mg via RESPIRATORY_TRACT
  Filled 2018-05-12: qty 3

## 2018-05-12 MED ORDER — KETAMINE HCL 50 MG/ML IJ SOLN
INTRAMUSCULAR | Status: AC
  Filled 2018-05-12: qty 10

## 2018-05-12 MED ORDER — TRAMADOL HCL 50 MG PO TABS
50.0000 mg | ORAL_TABLET | Freq: Four times a day (QID) | ORAL | Status: DC
  Administered 2018-05-12 – 2018-05-13 (×2): 50 mg via ORAL
  Filled 2018-05-12 (×3): qty 1

## 2018-05-12 MED ORDER — PREGABALIN 75 MG PO CAPS
75.0000 mg | ORAL_CAPSULE | Freq: Two times a day (BID) | ORAL | Status: DC
  Administered 2018-05-12 – 2018-05-16 (×8): 75 mg via ORAL
  Filled 2018-05-12 (×8): qty 1

## 2018-05-12 MED ORDER — POTASSIUM CHLORIDE CRYS ER 20 MEQ PO TBCR
20.0000 meq | EXTENDED_RELEASE_TABLET | Freq: Every day | ORAL | Status: DC
  Administered 2018-05-13 – 2018-05-16 (×4): 20 meq via ORAL
  Filled 2018-05-12 (×5): qty 1

## 2018-05-12 MED ORDER — TIOTROPIUM BROMIDE MONOHYDRATE 18 MCG IN CAPS
18.0000 ug | ORAL_CAPSULE | Freq: Every morning | RESPIRATORY_TRACT | Status: DC
  Administered 2018-05-13 – 2018-05-16 (×4): 18 ug via RESPIRATORY_TRACT
  Filled 2018-05-12: qty 5

## 2018-05-12 MED ORDER — FENTANYL CITRATE (PF) 100 MCG/2ML IJ SOLN: 25.0000 ug | INTRAMUSCULAR | Status: DC | PRN

## 2018-05-12 MED ORDER — METOCLOPRAMIDE HCL 10 MG PO TABS: 5.0000 mg | ORAL_TABLET | Freq: Three times a day (TID) | ORAL | Status: DC | PRN

## 2018-05-12 MED ORDER — ARTIFICIAL TEARS OPHTHALMIC OINT
TOPICAL_OINTMENT | OPHTHALMIC | Status: DC | PRN
  Administered 2018-05-12: 1 via OPHTHALMIC

## 2018-05-12 MED ORDER — TRAZODONE HCL 50 MG PO TABS
25.0000 mg | ORAL_TABLET | Freq: Every evening | ORAL | Status: DC | PRN
  Administered 2018-05-12: 25 mg via ORAL
  Filled 2018-05-12: qty 1

## 2018-05-12 MED ORDER — BISACODYL 5 MG PO TBEC
5.0000 mg | DELAYED_RELEASE_TABLET | Freq: Every day | ORAL | Status: DC | PRN
  Filled 2018-05-12: qty 1

## 2018-05-12 MED ORDER — IPRATROPIUM-ALBUTEROL 0.5-2.5 (3) MG/3ML IN SOLN
RESPIRATORY_TRACT | Status: AC
  Filled 2018-05-12: qty 3

## 2018-05-12 MED ORDER — SODIUM CHLORIDE 0.9 % IV SOLN
INTRAVENOUS | Status: DC | PRN
  Administered 2018-05-12: 50 ug/min via INTRAVENOUS

## 2018-05-12 MED ORDER — HYDROCODONE-ACETAMINOPHEN 5-325 MG PO TABS
1.0000 | ORAL_TABLET | ORAL | Status: DC | PRN
  Administered 2018-05-12 – 2018-05-13 (×2): 1 via ORAL

## 2018-05-12 MED ORDER — INSULIN ASPART 100 UNIT/ML ~~LOC~~ SOLN
0.0000 [IU] | Freq: Three times a day (TID) | SUBCUTANEOUS | Status: DC
  Administered 2018-05-13: 2 [IU] via SUBCUTANEOUS
  Administered 2018-05-13: 1 [IU] via SUBCUTANEOUS
  Administered 2018-05-14: 2 [IU] via SUBCUTANEOUS
  Administered 2018-05-14: 1 [IU] via SUBCUTANEOUS
  Filled 2018-05-12 (×4): qty 1

## 2018-05-12 MED ORDER — ENOXAPARIN SODIUM 40 MG/0.4ML ~~LOC~~ SOLN
40.0000 mg | SUBCUTANEOUS | Status: DC
  Administered 2018-05-13 – 2018-05-16 (×4): 40 mg via SUBCUTANEOUS
  Filled 2018-05-12 (×4): qty 0.4

## 2018-05-12 MED ORDER — GLYCOPYRROLATE 0.2 MG/ML IJ SOLN
INTRAMUSCULAR | Status: AC
  Filled 2018-05-12: qty 1

## 2018-05-12 MED ORDER — GLIPIZIDE 10 MG PO TABS
10.0000 mg | ORAL_TABLET | Freq: Two times a day (BID) | ORAL | Status: DC
  Administered 2018-05-13 – 2018-05-16 (×7): 10 mg via ORAL
  Filled 2018-05-12 (×9): qty 1

## 2018-05-12 MED ORDER — IPRATROPIUM-ALBUTEROL 0.5-2.5 (3) MG/3ML IN SOLN
3.0000 mL | Freq: Once | RESPIRATORY_TRACT | Status: AC
  Administered 2018-05-12: 3 mL via RESPIRATORY_TRACT

## 2018-05-12 MED ORDER — ZOLPIDEM TARTRATE 5 MG PO TABS: 5.0000 mg | ORAL_TABLET | Freq: Every evening | ORAL | Status: DC | PRN

## 2018-05-12 MED ORDER — ACETAMINOPHEN 500 MG PO TABS
500.0000 mg | ORAL_TABLET | Freq: Four times a day (QID) | ORAL | Status: AC
  Administered 2018-05-12 – 2018-05-13 (×4): 500 mg via ORAL
  Filled 2018-05-12 (×4): qty 1

## 2018-05-12 MED ORDER — ACETAMINOPHEN 325 MG PO TABS: 650.0000 mg | ORAL_TABLET | Freq: Four times a day (QID) | ORAL | Status: DC | PRN

## 2018-05-12 MED ORDER — GUAIFENESIN ER 600 MG PO TB12: 600.0000 mg | ORAL_TABLET | Freq: Two times a day (BID) | ORAL | Status: DC | PRN

## 2018-05-12 MED ORDER — BUPIVACAINE HCL (PF) 0.5 % IJ SOLN
INTRAMUSCULAR | Status: AC
  Filled 2018-05-12: qty 10

## 2018-05-12 MED ORDER — OXYCODONE HCL 5 MG PO TABS: 5.0000 mg | ORAL_TABLET | Freq: Once | ORAL | Status: DC | PRN

## 2018-05-12 MED ORDER — METHOCARBAMOL 500 MG PO TABS: 500.0000 mg | ORAL_TABLET | Freq: Four times a day (QID) | ORAL | Status: DC | PRN

## 2018-05-12 MED ORDER — CEFAZOLIN SODIUM-DEXTROSE 2-4 GM/100ML-% IV SOLN
2.0000 g | Freq: Four times a day (QID) | INTRAVENOUS | Status: AC
  Administered 2018-05-12 – 2018-05-13 (×3): 2 g via INTRAVENOUS
  Filled 2018-05-12 (×3): qty 100

## 2018-05-12 MED ORDER — MORPHINE SULFATE (PF) 2 MG/ML IV SOLN: 0.5000 mg | INTRAVENOUS | Status: DC | PRN

## 2018-05-12 MED ORDER — DOCUSATE SODIUM 100 MG PO CAPS
100.0000 mg | ORAL_CAPSULE | Freq: Two times a day (BID) | ORAL | Status: DC
  Administered 2018-05-13 – 2018-05-16 (×6): 100 mg via ORAL
  Filled 2018-05-12 (×8): qty 1

## 2018-05-12 MED ORDER — TOPIRAMATE 100 MG PO TABS
200.0000 mg | ORAL_TABLET | Freq: Two times a day (BID) | ORAL | Status: DC
  Administered 2018-05-12 – 2018-05-16 (×8): 200 mg via ORAL
  Filled 2018-05-12: qty 2
  Filled 2018-05-12: qty 8
  Filled 2018-05-12 (×9): qty 2

## 2018-05-12 MED ORDER — METHOCARBAMOL 1000 MG/10ML IJ SOLN
500.0000 mg | Freq: Four times a day (QID) | INTRAVENOUS | Status: DC | PRN
  Filled 2018-05-12: qty 5

## 2018-05-12 MED ORDER — PROPOFOL 10 MG/ML IV BOLUS
INTRAVENOUS | Status: DC | PRN
  Administered 2018-05-12: 20 mg via INTRAVENOUS

## 2018-05-12 MED ORDER — NIACIN 500 MG PO TABS
500.0000 mg | ORAL_TABLET | Freq: Every day | ORAL | Status: DC
  Administered 2018-05-13 – 2018-05-16 (×4): 500 mg via ORAL
  Filled 2018-05-12 (×5): qty 1

## 2018-05-12 MED ORDER — MORPHINE SULFATE (PF) 4 MG/ML IV SOLN
4.0000 mg | Freq: Once | INTRAVENOUS | Status: DC
  Filled 2018-05-12: qty 1

## 2018-05-12 MED ORDER — SODIUM CHLORIDE 0.9 % IV SOLN
INTRAVENOUS | Status: DC
  Administered 2018-05-12 (×2): via INTRAVENOUS

## 2018-05-12 MED ORDER — ALBUTEROL SULFATE HFA 108 (90 BASE) MCG/ACT IN AERS: 2.0000 | INHALATION_SPRAY | Freq: Four times a day (QID) | RESPIRATORY_TRACT | Status: DC | PRN

## 2018-05-12 MED ORDER — ACETAMINOPHEN 650 MG RE SUPP: 650.0000 mg | Freq: Four times a day (QID) | RECTAL | Status: DC | PRN

## 2018-05-12 MED ORDER — BISACODYL 5 MG PO TBEC
5.0000 mg | DELAYED_RELEASE_TABLET | Freq: Every day | ORAL | Status: DC | PRN
  Administered 2018-05-15: 5 mg via ORAL

## 2018-05-12 MED ORDER — KETAMINE HCL 50 MG/ML IJ SOLN
INTRAMUSCULAR | Status: DC | PRN
  Administered 2018-05-12: 25 mg via INTRAVENOUS

## 2018-05-12 MED ORDER — MAGNESIUM CITRATE PO SOLN
1.0000 | Freq: Once | ORAL | Status: DC | PRN
  Filled 2018-05-12: qty 296

## 2018-05-12 MED ORDER — ADULT MULTIVITAMIN W/MINERALS CH
1.0000 | ORAL_TABLET | Freq: Every day | ORAL | Status: DC
  Administered 2018-05-13 – 2018-05-16 (×4): 1 via ORAL
  Filled 2018-05-12 (×4): qty 1

## 2018-05-12 MED ORDER — METOCLOPRAMIDE HCL 5 MG/ML IJ SOLN: 5.0000 mg | Freq: Three times a day (TID) | INTRAMUSCULAR | Status: DC | PRN

## 2018-05-12 MED ORDER — PHENYLEPHRINE HCL 10 MG/ML IJ SOLN
INTRAMUSCULAR | Status: DC | PRN
  Administered 2018-05-12 (×2): 100 ug via INTRAVENOUS

## 2018-05-12 MED ORDER — MIDAZOLAM HCL 5 MG/5ML IJ SOLN
INTRAMUSCULAR | Status: DC | PRN
  Administered 2018-05-12: 2 mg via INTRAVENOUS

## 2018-05-12 MED ORDER — ACETAMINOPHEN 325 MG PO TABS: 325.0000 mg | ORAL_TABLET | Freq: Four times a day (QID) | ORAL | Status: DC | PRN

## 2018-05-12 MED ORDER — CYANOCOBALAMIN 500 MCG PO TABS
500.0000 ug | ORAL_TABLET | Freq: Every day | ORAL | Status: DC
  Administered 2018-05-13 – 2018-05-16 (×4): 500 ug via ORAL
  Filled 2018-05-12 (×5): qty 1

## 2018-05-12 MED ORDER — CEFAZOLIN SODIUM-DEXTROSE 2-4 GM/100ML-% IV SOLN
2.0000 g | Freq: Once | INTRAVENOUS | Status: AC
  Administered 2018-05-12: 2 g via INTRAVENOUS
  Filled 2018-05-12: qty 100

## 2018-05-12 MED ORDER — GLYCOPYRROLATE 0.2 MG/ML IJ SOLN
INTRAMUSCULAR | Status: DC | PRN
  Administered 2018-05-12: 0.2 mg via INTRAVENOUS

## 2018-05-12 MED ORDER — LAMOTRIGINE 25 MG PO TABS
200.0000 mg | ORAL_TABLET | Freq: Two times a day (BID) | ORAL | Status: DC
  Administered 2018-05-12 – 2018-05-16 (×9): 200 mg via ORAL
  Filled 2018-05-12: qty 2
  Filled 2018-05-12 (×7): qty 8
  Filled 2018-05-12: qty 2
  Filled 2018-05-12: qty 8

## 2018-05-12 SURGICAL SUPPLY — 57 items
BIT DRILL 3.2 CALIBRATED (BIT) ×2
BIT DRILL 3.2MM CALIBRATED (BIT) ×2 IMPLANT
BIT DRILL 3.8 CALIBRATED (BIT) ×1
BIT DRILL 3.8MM CALIBRATED (BIT) ×1 IMPLANT
BIT DRILL CANN SZ 5.5 (BIT) ×1 IMPLANT
BIT DRILL POLYAX 4.5 (BIT) ×1 IMPLANT
CANISTER SUCT 1200ML W/VALVE (MISCELLANEOUS) ×2 IMPLANT
CHLORAPREP W/TINT 26 (MISCELLANEOUS) ×2 IMPLANT
COVER WAND RF STERILE (DRAPES) ×2 IMPLANT
DRAPE C-ARM XRAY 36X54 (DRAPES) ×2 IMPLANT
DRAPE C-ARMOR (DRAPES) ×2 IMPLANT
DRILL BIT 3.2MM CALIBRATED (BIT) ×2
DRILL BIT 3.8MM CALIBRATED (BIT) ×1
DRILL BIT 4.5MM (BIT) ×2
DRILL BIT CANN SZ 5.5 (BIT) ×2
ELECT REM PT RETURN 9FT ADLT (ELECTROSURGICAL) ×2
ELECTRODE REM PT RTRN 9FT ADLT (ELECTROSURGICAL) ×1 IMPLANT
GAUZE PETRO XEROFOAM 1X8 (MISCELLANEOUS) ×2 IMPLANT
GAUZE SPONGE 4X4 12PLY STRL (GAUZE/BANDAGES/DRESSINGS) ×2 IMPLANT
GLOVE BIOGEL PI IND STRL 9 (GLOVE) ×1 IMPLANT
GLOVE BIOGEL PI INDICATOR 9 (GLOVE) ×1
GLOVE SURG SYN 9.0  PF PI (GLOVE) ×1
GLOVE SURG SYN 9.0 PF PI (GLOVE) ×1 IMPLANT
GOWN SRG 2XL LVL 4 RGLN SLV (GOWNS) ×1 IMPLANT
GOWN STRL NON-REIN 2XL LVL4 (GOWNS) ×1
GOWN STRL REUS W/ TWL LRG LVL3 (GOWN DISPOSABLE) ×1 IMPLANT
GOWN STRL REUS W/TWL LRG LVL3 (GOWN DISPOSABLE) ×1
GUIDEPIN 3.2  ENDO CALB STRL (PIN) ×1
GUIDEPIN 3.2 ENDO CALB STRL (PIN) ×1 IMPLANT
HEMOVAC 400ML (MISCELLANEOUS) ×2
K-WIRE ACE 1.6X6 (WIRE) ×2
KIT DRAIN HEMOVAC JP 7FR 400ML (MISCELLANEOUS) ×1 IMPLANT
KIT TURNOVER KIT A (KITS) ×2 IMPLANT
KWIRE ACE 1.6X6 (WIRE) ×1 IMPLANT
MAT ABSORB  FLUID 56X50 GRAY (MISCELLANEOUS) ×1
MAT ABSORB FLUID 56X50 GRAY (MISCELLANEOUS) ×1 IMPLANT
NEEDLE FILTER BLUNT 18X 1/2SAF (NEEDLE) ×1
NEEDLE FILTER BLUNT 18X1 1/2 (NEEDLE) ×1 IMPLANT
NS IRRIG 500ML POUR BTL (IV SOLUTION) ×2 IMPLANT
PACK HIP PROSTHESIS (MISCELLANEOUS) ×2 IMPLANT
PLATE FEMORAL 9H LT (Plate) ×2 IMPLANT
SCALPEL PROTECTED #10 DISP (BLADE) ×4 IMPLANT
SCREW CORT 4.5X70 (Screw) ×2 IMPLANT
SCREW LOCK DIST FEM 5.5X70 (Screw) ×2 IMPLANT
SCREW LOCK DIST FEM 5.5X80 (Screw) ×2 IMPLANT
SCREW LOCK PLY PROX TIB 8X25 (Screw) ×2 IMPLANT
SCREW LOCK PLY TIB 5.5X75 (Screw) ×2 IMPLANT
SCREW NLOCK CORT 4.5X40 (Screw) ×4 IMPLANT
SCREW NLOCK CORT 4.5X42 (Screw) ×6 IMPLANT
SCREW NLOCK CORT STAR 4.5X65 (Screw) ×2 IMPLANT
SCREW NLOCK DIST FEM 5.5X75 (Screw) ×2 IMPLANT
STAPLER SKIN PROX 35W (STAPLE) ×2 IMPLANT
SUT VIC AB 0 CT1 36 (SUTURE) ×4 IMPLANT
SUT VIC AB 2-0 CT1 27 (SUTURE) ×2
SUT VIC AB 2-0 CT1 TAPERPNT 27 (SUTURE) ×2 IMPLANT
SYR 5ML LL (SYRINGE) ×2 IMPLANT
TAPE MICROFOAM 4IN (TAPE) ×2 IMPLANT

## 2018-05-12 NOTE — Care Management (Signed)
Requested Lovenox price from [facility]

## 2018-05-12 NOTE — ED Notes (Signed)
Report given to Jennifer RN

## 2018-05-12 NOTE — ED Triage Notes (Signed)
Pt presents from home with c/o mechanical fall at 1am. Pt states she slipped because she was not wearing socks. Possible break of left femur according to ems.  Swelling noted upper left leg. Hx of Left knee surgery. Chronically on 2L, hx of COPD. 200 fetanyl given in route by ems. Pt given 500 mL bolus in route as well. Blood sugar 218.

## 2018-05-12 NOTE — Progress Notes (Signed)
Expiratory wheezing noted at rest while pt waking up in PACU.  Clears with coughing.  Dr. Gabriel Rung at bs.  Pt states she does not feel like she needs a breathing treatment and that this is normal for her.  HOB elevated.  Encouraged to cough and clear throat to help with wheezing.  Remains on O2 at 2L as per home use.  Denies any pain or sob.  Lurene Shadow, RN

## 2018-05-12 NOTE — TOC Benefit Eligibility Note (Signed)
Transition of Care [facility]) Benefit Eligibility Note    Patient Details  Name: Brenda Wu MRN: [id number] Date of Birth: October 16, 1955   Medication/Dose: Lovenox '40mg'$  once daily x 14 days   Covered?: No     Prescription Coverage Preferred Pharmacy: [facility], [facility], [facility]  Spoke with Person/Company/Phone Number:: Keri[phone]     Prior Approval: Yes([phone] if Lovenox preferred.)  Deductible: Unmet($160 deductible on plan, not met as of time of call.)  Additional Notes: Generic Enoxaparin covered.  Considered Tier 4.  Estimated cost is $217.10.  Prior approval not required.      Dannette Barbara Phone Number: [phone] or [phone] 05/12/2018, 9:46 AM

## 2018-05-12 NOTE — Anesthesia Post-op Follow-up Note (Signed)
Anesthesia QCDR form completed.        

## 2018-05-12 NOTE — ED Provider Notes (Addendum)
[facility] Emergency Department Provider Note    First MD Initiated Contact with Patient 05/12/18 [phone]     (approximate)  I have reviewed the triage vital signs and the nursing notes.   HISTORY  Chief Complaint Fall    HPI Brenda Wu is a 63 y.o. female with below list of chronic medical conditions including CHF COPD (baseline 2 L nasal cannula oxygen) hypertension MI and previous left knee replacement at [facility] presents to the emergency department following accidental fall with resultant left thigh pain that is currently 10 out of 10.  Patient was given 200 mcg in total of fentanyl from EMS however patient states pain is still 10 out of 10 worse with any movement of the leg.        Past Medical History:  Diagnosis Date  . Chronic combined systolic (congestive) and diastolic (congestive) heart failure (HCC)    a. 10/2017 Echo: EF 45-50%, diff HK. Mild to mod MR, mildly dil LA. Mod dil RV w/ mildly reduced RV fxn.  Brenda Wu. COPD (chronic obstructive pulmonary disease) (HCC)   . Depression   . Diabetes mellitus without complication (HCC)   . Hypercholesteremia   . Hypertension   . Morbid obesity (HCC)   . NICM (nonischemic cardiomyopathy) (HCC)   . NSTEMI (non-ST elevated myocardial infarction) (HCC)    a. 12/2017 -->Cath: nl cors. EF 45-50%.  . Seizures (HCC)   . Stroke (HCC)   . Tricuspid regurgitation    a. 10/2017 Echo: Mild to mod TR.    Patient Active Problem List   Diagnosis Date Noted  . NICM (nonischemic cardiomyopathy) (HCC) 02/13/2018  . Non-ST elevation (NSTEMI) myocardial infarction (HCC)   . Chronic combined systolic and diastolic heart failure (HCC) 12/12/2017  . HTN (hypertension) 12/12/2017  . Diabetes (HCC) 12/12/2017  . COPD exacerbation (HCC) 02/07/2017  . History of cerebellar stroke 03/12/2016  . History of seizures 03/12/2016  . Pulmonary nodule, left 01/05/2016  . Paroxysmal atrial fibrillation (HCC) 10/17/2015  .  Cerebellar stroke (HCC) 06/26/2015  . Transient diplopia 06/09/2015  . Asthma-chronic obstructive pulmonary disease overlap syndrome (HCC) 05/30/2015  . COPD with asthma (HCC) 05/30/2015  . Hypoxemia 05/30/2015  . Obstructive sleep apnea syndrome 05/30/2015  . OSA on CPAP 05/30/2015  . Obesity (BMI 30-39.9) 03/12/2015  . Bilateral hearing loss 01/22/2015  . Generalized seizure disorder (HCC) 01/22/2015  . Hyperlipidemia associated with type 2 diabetes mellitus (HCC) 01/22/2015  . Hypertension associated with diabetes (HCC) 01/22/2015  . Morbid obesity due to excess calories (HCC) 01/22/2015  . Primary osteoarthritis involving multiple joints 01/22/2015  . Severe episode of recurrent major depressive disorder, without psychotic features (HCC) 01/22/2015    Past Surgical History:  Procedure Laterality Date  . ABDOMINAL HYSTERECTOMY    . LEFT HEART CATH AND CORONARY ANGIOGRAPHY N/A 01/09/2018   Procedure: LEFT HEART CATH AND CORONARY ANGIOGRAPHY;  Surgeon: Iran OuchArida, Muhammad A, MD;  Location: [facility] INVASIVE CV LAB;  Service: Cardiovascular;  Laterality: N/A;  . REPLACEMENT TOTAL KNEE      Prior to Admission medications   Medication Sig Start Date End Date Taking? Authorizing Provider  albuterol (PROVENTIL HFA;VENTOLIN HFA) 108 (90 Base) MCG/ACT inhaler Inhale 2 puffs into the lungs 4 (four) times daily as needed for wheezing or shortness of breath.  01/05/16  Yes [provider]  albuterol (PROVENTIL) (2.5 MG/3ML) 0.083% nebulizer solution Inhale 2.5 mg into the lungs every 6 (six) hours as needed for wheezing or shortness of breath.  10/01/16  Yes [provider]  aspirin EC 81 MG tablet Take 1 tablet (81 mg total) by mouth daily. 02/13/18  Yes [name], MD  atorvastatin (LIPITOR) 40 MG tablet Take 40 mg by mouth every evening.    Yes [provider]  budesonide-formoterol (SYMBICORT) 160-4.5 MCG/ACT inhaler Inhale 2 puffs into the lungs 2 (two) times  daily. 05/22/15  Yes [provider]  diltiazem (CARDIZEM CD) 180 MG 24 hr capsule Take 180 mg by mouth daily.   Yes [provider]  ferrous sulfate 325 (65 FE) MG tablet Take 325 mg by mouth daily with breakfast.   Yes [provider]  glipiZIDE (GLUCOTROL) 10 MG tablet Take 10 mg by mouth 2 (two) times daily before a meal.    Yes [provider]  guaiFENesin (MUCINEX) 600 MG 12 hr tablet Take 600 mg by mouth 2 (two) times daily as needed for cough.    Yes [provider]  Insulin Glargine (LANTUS SOLOSTAR) 100 UNIT/ML Solostar Pen Inject 20 Units into the skin daily.  01/21/17  Yes [provider]  lamoTRIgine (LAMICTAL) 200 MG tablet Take 200 mg by mouth 2 (two) times daily.    Yes [provider]  losartan (COZAAR) 50 MG tablet Take 50 mg by mouth daily.   Yes [provider]  Multiple Vitamin (MULTI-VITAMINS) TABS Take 1 tablet by mouth daily.   Yes [provider]  niacin 500 MG tablet Take 500 mg by mouth daily.   Yes [provider]  potassium chloride SA (K-DUR,KLOR-CON) 20 MEQ tablet Take 1 tablet (20 mEq total) by mouth daily. 02/12/17  Yes [name], MD  pregabalin (LYRICA) 75 MG capsule Take 75 mg by mouth 2 (two) times daily.   Yes [provider]  senna (SENOKOT) 8.6 MG TABS tablet Take 1 tablet by mouth daily.    Yes [provider]  sitaGLIPtin (JANUVIA) 50 MG tablet Take 50 mg by mouth daily.   Yes [provider]  tiotropium (SPIRIVA) 18 MCG inhalation capsule Place 18 mcg into inhaler and inhale daily.   Yes [provider]  topiramate (TOPAMAX) 200 MG tablet Take 200 mg by mouth 2 (two) times daily.   Yes [provider]  torsemide (DEMADEX) 20 MG tablet Take 2 tablets (40 mg total) by mouth daily. 01/26/18  Yes Creig Hines, NP  vitamin B-12 (CYANOCOBALAMIN) 500 MCG tablet Take 500 mcg by mouth daily.   Yes [provider]    Allergies Metformin and Bismuth subsalicylate  Family History  Problem Relation Age of Onset  . Hypertension Father   . Asthma Sister   . Heart murmur Sister   . Diabetes Sister     Social History Social History   Tobacco Use  . Smoking status: Former Smoker    Packs/day: 1.00    Years: 30.00    Pack years: 30.00    Types: Cigarettes    Last attempt to quit: 02/22/2002    Years since quitting: 16.2  . Smokeless tobacco: Never Used  Substance Use Topics  . Alcohol use: No  . Drug use: No    Review of Systems Constitutional: No fever/chills Eyes: No visual changes. ENT: No sore throat. Cardiovascular: Denies chest pain. Respiratory: Denies shortness of breath. Gastrointestinal: No abdominal pain.  No nausea, no vomiting.  No diarrhea.  No constipation. Genitourinary: Negative for dysuria. Musculoskeletal: Negative for neck pain.  Positive for left thigh pain Integumentary: Negative for  rash. Neurological: Negative for headaches, focal weakness or numbness.   ____________________________________________   PHYSICAL EXAM:  VITAL SIGNS: ED Triage Vitals  Enc Vitals Group     BP 05/12/18 0559 92/62     Pulse Rate 05/12/18 0559 75     Resp 05/12/18 0559 18     Temp 05/12/18 0555 98 F (36.7 C)     Temp Source 05/12/18 0555 Oral     SpO2 05/12/18 0559 95 %     Weight 05/12/18 0555 93.5 kg (206 lb 2 oz)     Height 05/12/18 0555 1.626 m (5\' 4" )     Head Circumference --      Peak Flow --      Pain Score 05/12/18 0555 10     Pain Loc --      Pain Edu? --      Excl. in GC? --     Constitutional: Alert and oriented.  Parent discomfort  eyes: Conjunctivae are normal. Mouth/Throat: Mucous membranes are moist.  Oropharynx non-erythematous. Neck: No stridor.   Cardiovascular: Normal rate, regular rhythm. Good peripheral circulation. Grossly normal heart sounds. Respiratory: Normal respiratory effort.  No retractions. Lungs CTAB.  Gastrointestinal: Soft and nontender. No distention.  Musculoskeletal: Pain with very gentle palpation of the left femur.  Palpable DP PT pulses that are equal bilaterally Neurologic:  Normal speech and language. No gross focal neurologic deficits are appreciated.  Skin:  Skin is warm, dry and intact. No rash noted. Psychiatric: Mood and affect are normal. Speech and behavior are normal.  ____________________________________________   LABS (all labs ordered are listed, but only abnormal results are displayed)  Labs Reviewed  CBC  COMPREHENSIVE METABOLIC PANEL   ____________________________________________  EKG  ED ECG REPORT I, Wren N Kaia Depaolis, the attending physician, personally viewed and interpreted this ECG.   Date: 05/12/2018  EKG Time: 5:54 AM  Rate: 75  Rhythm: Normal sinus rhythm  Axis: Normal  Intervals: Prolonged QTC 501  ST&T Change: None  ____________________________________________  RADIOLOGY I, [place] N Shameeka Silliman, personally viewed and evaluated these images (plain radiographs) as part of my medical decision making, as well as reviewing the written report by the radiologist.  ED MD interpretation: Displaced distal femur fracture above knee arthroplasty  Official radiology report(s): Dg Hip Unilat W Or Wo Pelvis 2-3 Views Left  Result Date: 05/12/2018 CLINICAL DATA:  Left hip pain after mechanical fall EXAM: DG HIP (WITH OR WITHOUT PELVIS) 2-3V LEFT COMPARISON:  None. FINDINGS: Pelvis radiograph is limited by overlapping EKG leads. There is no evidence of hip or pelvic ring fracture. No hip dislocation. IMPRESSION: Negative. Electronically Signed   By: Marnee Spring M.D.   On: 05/12/2018 06:30   Dg Femur Min 2 Views Left  Result Date: 05/12/2018 CLINICAL DATA:  Leg pain after fall EXAM: LEFT FEMUR 2 VIEWS COMPARISON:  None. FINDINGS: Primarily transverse distal femur fracture above a total knee arthroplasty. The fracture is displaced posteriorly by 50% with  dorsal impaction. Osteopenia. IMPRESSION: Displaced distal femur fracture above a total knee arthroplasty. Electronically Signed   By: Marnee Spring M.D.   On: 05/12/2018 06:31     Procedures   ____________________________________________   INITIAL IMPRESSION / MDM / ASSESSMENT AND PLAN / ED COURSE  As part of my medical decision making, I reviewed the following data within the electronic MEDICAL RECORD NUMBER  63 year old female presented with above-stated history and physical exam secondary to accidental fall with concern for possible femur fracture which was confirmed  on x-ray.  Patient given additional dose of morphine in the emergency department with some pain improvement.  Patient discussed with Dr. Rosita Kea orthopedic surgeon on-call who requested that the hospitalist perform admission and he would consult for repair. ____________________________________________  FINAL CLINICAL IMPRESSION(S) / ED DIAGNOSES  Final diagnoses:  Displaced comminuted fracture of shaft of right femur, initial encounter for closed fracture (HCC)     MEDICATIONS GIVEN DURING THIS VISIT:  Medications  morphine 2 MG/ML injection 2 mg (2 mg Intravenous Given 05/12/18 1610)     ED Discharge Orders    None       Note:  This document was prepared using Dragon voice recognition software and may include unintentional dictation errors.   Darci Current, MD 05/12/18 9604    Darci Current, MD 05/12/18 [phone]

## 2018-05-12 NOTE — Anesthesia Procedure Notes (Signed)
Spinal  Patient location during procedure: OR Staffing Anesthesiologist: Piscitello, Joseph K, MD Resident/CRNA: Rodgers Likes, CRNA Performed: resident/CRNA  Preanesthetic Checklist Completed: patient identified, site marked, surgical consent, pre-op evaluation, timeout performed, IV checked, risks and benefits discussed and monitors and equipment checked Spinal Block Patient position: sitting Prep: ChloraPrep and site prepped and draped Patient monitoring: heart rate, continuous pulse ox, blood pressure and cardiac monitor Approach: midline Location: L4-5 Injection technique: single-shot Needle Needle type: Introducer and Pencan  Needle gauge: 24 G Needle length: 9 cm Additional Notes Negative paresthesia. Negative blood return. Positive free-flowing CSF. Expiration date of kit checked and confirmed. Patient tolerated procedure well, without complications.       

## 2018-05-12 NOTE — Op Note (Signed)
05/12/2018  2:53 PM  PATIENT:  Brenda Wu  63 y.o. female  PRE-OPERATIVE DIAGNOSIS:  Distal Femur Fracture periprosthetic left  POST-OPERATIVE DIAGNOSIS:  Distal Femur Fracture periprosthetic left  PROCEDURE:  Procedure(s): OPEN REDUCTION INTERNAL FIXATION (ORIF) DISTAL FEMUR FRACTURE (Left)  SURGEON: Leitha Schuller, MD  ASSISTANTS: None  ANESTHESIA:   spinal  EBL:  Total I/O In: 700 [I.V.:700] Out: 400 [Urine:200; Blood:200]  BLOOD ADMINISTERED:none  DRAINS: none   LOCAL MEDICATIONS USED:  NONE  SPECIMEN:  No Specimen  DISPOSITION OF SPECIMEN:  N/A  COUNTS:  YES  TOURNIQUET:  * No tourniquets in log *  IMPLANTS: None Biomet polyaxial femoral plate left 9 hole with multiple screws  DICTATION: .Dragon Dictation patient was brought to the operating room and after adequate spinal anesthesia was obtained, the left leg was prepped and draped in the usual sterile fashion.  After patient identification and timeout procedure were completed an incision was made over the distal femur and the IT band split.  The distal femur was exposed and traction applied to get partial reduction with very good reduction on the lateral slight medial displacement on the AP view.  The 9 hole plate was then slid subcutaneously and submuscularly up along the shaft of the femur and when it was in the appropriate location and pinned distally and then proximally the most proximal screw hole had a drill placed across it to hold it in this position and prevent any rotation.  The distal screw holes were then filled using a nonlocking screw first try to bring the femur and better alignment and then locking screws with the initial nonlocking screw then removed and replaced with a locking screw to try to get good fixation although the bone was quite poor quality the most posterior screw could not be used because the bone depth was shorter than the shorter screw.  After the screw holes been filled the proximal 4  screw holes were filled using nonlocking screws pulling the plate to the bone.  An additional screw was then placed after removing the targeting device for the proximal locking screws and a screw was placed to try to get a compression screw grabbing the medial spike of the distal fragment to try to help hold alignment after the screw was placed after over drilling the proximal cortex the knee was thoroughly irrigated and the IT band closed with a heavy #1 Vicryl 2-0 Vicryl subcutaneously and skin staples with the proximal screw holes closed using staples.  Xeroform and honeycomb dressing applied.  PLAN OF CARE: Continue as inpatient  PATIENT DISPOSITION:  PACU - hemodynamically stable.

## 2018-05-12 NOTE — TOC Progression Note (Signed)
Transition of Care [facility]) - Progression Note    Patient Details  Name: Brenda Wu MRN: [id number] Date of Birth: August 06, 1955  [facility]) CM/SW Contact  Barrie Dunker, RN Phone Number: 05/12/2018, 2:28 PM  Clinical Narrative:     Patient was set up with a NIV thru [facility] in Oct 2019, has chronic O2 at home 3 liters, has a RW and a rollator at home, lives alone, gets medication thru [employer] mail order and Psychologist, forensic       Expected Discharge Plan and Services                                     Social Determinants of Health (SDOH) Interventions    Readmission Risk Interventions No flowsheet data found.

## 2018-05-12 NOTE — Consult Note (Signed)
Patient is a 63 year old female who had a left total knee done about 16 years ago at [facility] suffered a fall earlier today at home.  She denies loss of consciousness.  She has had fairly good results with the knee and has not had any recent visits regarding it or problems with instability or significant pain.  She does have extensive medical problems  On exam she has swelling to the distal femur she has a trace dorsalis pedis pulse sensation is intact in the foot and she is about a flexed and the toes.  Radiographs show a displaced supracondylar femur fracture with apparently intact hardware with mild comminution.  Impression is displaced supracondylar periprosthetic fracture left knee  Recommendation is for open reduction internal fixation of medically able

## 2018-05-12 NOTE — Transfer of Care (Signed)
Immediate Anesthesia Transfer of Care Note  Patient: Brenda Wu  Procedure(s) Performed: OPEN REDUCTION INTERNAL FIXATION (ORIF) DISTAL FEMUR FRACTURE (Left Leg Lower)  Patient Location: PACU  Anesthesia Type:Spinal  Level of Consciousness: awake  Airway & Oxygen Therapy: Patient Spontanous Breathing and Patient connected to nasal cannula oxygen  Post-op Assessment: Report given to RN and Post -op Vital signs reviewed and stable  Post vital signs: Reviewed  Last Vitals:  Vitals Value Taken Time  BP 87/46 05/12/2018  2:46 PM  Temp    Pulse 79 05/12/2018  2:47 PM  Resp 12 05/12/2018  2:46 PM  SpO2 100 % 05/12/2018  2:47 PM  Vitals shown include unvalidated device data.  Last Pain:  Vitals:   05/12/18 1011  TempSrc:   PainSc: 10-Worst pain ever         Complications: No apparent anesthesia complications

## 2018-05-12 NOTE — TOC Progression Note (Signed)
Transition of Care [facility]) - Progression Note    Patient Details  Name: Brenda Wu MRN: [id number] Date of Birth: 06/17/1955  Transition of Care [facility]) CM/SW Contact  Barrie Dunker, RN Phone Number: 05/12/2018, 2:18 PM  Clinical Narrative:     [facility] recently had her 11/20-12/30 for SN/PT/SW/HHA. If she needs home health again They can take her back per [place] [facility].         Expected Discharge Plan and Services                                     Social Determinants of Health (SDOH) Interventions    Readmission Risk Interventions No flowsheet data found.

## 2018-05-12 NOTE — TOC Progression Note (Signed)
Transition of Care (TOC) - Progression Note  Generic Enoxaparin covered.  Considered Tier 4.  Estimated cost is $217.10.  Prior approval not required.   with Good RX card Medciation is $91.52 at [facility]  Patient Details  Name: Brenda Wu MRN: [id number] Date of Birth: 07/02/1955  Transition of Care [facility]) CM/SW Contact  Barrie Dunker, RN Phone Number: 05/12/2018, 2:17 PM  Clinical Narrative:            Expected Discharge Plan and Services                                     Social Determinants of Health (SDOH) Interventions    Readmission Risk Interventions No flowsheet data found.

## 2018-05-12 NOTE — Plan of Care (Signed)
Pt assessed. Pt has complained of pain and pain meds given as ordered. Offered repositioning, pt refused. Pt education on pain meds and activity, foley and post surgery. Pt appears not open to discussion at this time. Will follow up with pt throughout shift. Pdowless, rn 05/12/2018

## 2018-05-12 NOTE — Anesthesia Preprocedure Evaluation (Signed)
Anesthesia Evaluation  Patient identified by MRN, date of birth, ID band Patient awake    Reviewed: Allergy & Precautions, H&P , NPO status , Patient's Chart, lab work & pertinent test results  History of Anesthesia Complications Negative for: history of anesthetic complications  Airway Mallampati: III  TM Distance: >3 FB Neck ROM: limited    Dental  (+) Poor Dentition, Missing, Lower Dentures, Upper Dentures   Pulmonary shortness of breath and with exertion, asthma , sleep apnea , COPD,  COPD inhaler and oxygen dependent, former smoker,           Cardiovascular Exercise Tolerance: Poor hypertension, (-) angina+ Past MI and +CHF       Neuro/Psych Seizures -, Well Controlled,  PSYCHIATRIC DISORDERS CVA    GI/Hepatic negative GI ROS, Neg liver ROS, neg GERD  ,  Endo/Other  diabetes, Type 2, Insulin Dependent  Renal/GU      Musculoskeletal  (+) Arthritis ,   Abdominal   Peds  Hematology negative hematology ROS (+)   Anesthesia Other Findings Past Medical History: No date: Chronic combined systolic (congestive) and diastolic  (congestive) heart failure (HCC)     Comment:  a. 10/2017 Echo: EF 45-50%, diff HK. Mild to mod MR,               mildly dil LA. Mod dil RV w/ mildly reduced RV fxn. No date: COPD (chronic obstructive pulmonary disease) (HCC) No date: Depression No date: Diabetes mellitus without complication (HCC) No date: Hypercholesteremia No date: Hypertension No date: Morbid obesity (HCC) No date: NICM (nonischemic cardiomyopathy) (HCC) No date: NSTEMI (non-ST elevated myocardial infarction) (HCC)     Comment:  a. 12/2017 -->Cath: nl cors. EF 45-50%. No date: Seizures (HCC) No date: Stroke [facility]) No date: Tricuspid regurgitation     Comment:  a. 10/2017 Echo: Mild to mod TR.  Past Surgical History: No date: ABDOMINAL HYSTERECTOMY 01/09/2018: LEFT HEART CATH AND CORONARY ANGIOGRAPHY; N/A     Comment:   Procedure: LEFT HEART CATH AND CORONARY ANGIOGRAPHY;                Surgeon: Iran Ouch, MD;  Location: [facility] INVASIVE               CV LAB;  Service: Cardiovascular;  Laterality: N/A; No date: REPLACEMENT TOTAL KNEE  BMI    Body Mass Index:  35.38 kg/m      Reproductive/Obstetrics negative OB ROS                             Anesthesia Physical Anesthesia Plan  ASA: IV  Anesthesia Plan: Spinal   Post-op Pain Management:    Induction:   PONV Risk Score and Plan:   Airway Management Planned: Natural Airway and Nasal Cannula  Additional Equipment:   Intra-op Plan:   Post-operative Plan:   Informed Consent: I have reviewed the patients History and Physical, chart, labs and discussed the procedure including the risks, benefits and alternatives for the proposed anesthesia with the patient or authorized representative who has indicated his/her understanding and acceptance.     Dental Advisory Given  Plan Discussed with: Anesthesiologist, CRNA and Surgeon  Anesthesia Plan Comments: (Patient informed that they are higher risk for complications from anesthesia during this procedure due to their medical history.  Patient voiced understanding.  Patient has medical clearance for this procedure.   Patient reports no bleeding problems and no anticoagulant use.  Plan  for spinal with backup GA  Patient consented for risks of anesthesia including but not limited to:  - adverse reactions to medications - risk of bleeding, infection, nerve damage and headache - risk of failed spinal - damage to teeth, lips or other oral mucosa - sore throat or hoarseness - Damage to heart, brain, lungs or loss of life  Patient voiced understanding.)        Anesthesia Quick Evaluation

## 2018-05-12 NOTE — ED Notes (Signed)
ED TO INPATIENT HANDOFF REPORT  ED Nurse Name and Phone #: Kahron Kauth RN [phone]  S Name/Age/Gender Brenda Wu 63 y.o. female Room/Bed: ED24A/ED24A  Code Status   Code Status: Full Code  Home/SNF/Other Home Patient oriented to: self, place, time and situation Is this baseline? Yes   Triage Complete: Triage complete  Chief Complaint [facility] L Femur Fx  Triage Note Pt presents from home with c/o mechanical fall at 1am. Pt states she slipped because she was not wearing socks. Possible break of left femur according to ems.  Swelling noted upper left leg. Hx of Left knee surgery. Chronically on 2L, hx of COPD. 200 fetanyl given in route by ems. Pt given 500 mL bolus in route as well. Blood sugar 218.    Allergies Allergies  Allergen Reactions  . Metformin Other (See Comments)    Other reaction(s): Other (See Comments) Constipation, dry mouth, dizziness  . Bismuth Subsalicylate Rash    Pepto Bismol    Level of Care/Admitting Diagnosis ED Disposition    ED Disposition Condition Comment   Admit  Hospital Area: [facility] [[id number]]  Level of Care: Med-Surg [16]  Diagnosis: Fracture of prosthetic knee [place]) [[id number]]  Admitting Physician: Katha Hamming [[id number]]  Attending Physician: Katha Hamming [phone]  Estimated length of stay: past midnight tomorrow  Certification:: I certify this patient will need inpatient services for at least 2 midnights  PT Class (Do Not Modify): Inpatient [101]  PT Acc Code (Do Not Modify): Private [1]       B Medical/Surgery History Past Medical History:  Diagnosis Date  . Chronic combined systolic (congestive) and diastolic (congestive) heart failure (HCC)    a. 10/2017 Echo: EF 45-50%, diff HK. Mild to mod MR, mildly dil LA. Mod dil RV w/ mildly reduced RV fxn.  [name] COPD (chronic obstructive pulmonary disease) (HCC)   . Depression   . Diabetes mellitus without complication (HCC)   . Hypercholesteremia   .  Hypertension   . Morbid obesity (HCC)   . NICM (nonischemic cardiomyopathy) (HCC)   . NSTEMI (non-ST elevated myocardial infarction) (HCC)    a. 12/2017 -->Cath: nl cors. EF 45-50%.  . Seizures (HCC)   . Stroke (HCC)   . Tricuspid regurgitation    a. 10/2017 Echo: Mild to mod TR.   Past Surgical History:  Procedure Laterality Date  . ABDOMINAL HYSTERECTOMY    . LEFT HEART CATH AND CORONARY ANGIOGRAPHY N/A 01/09/2018   Procedure: LEFT HEART CATH AND CORONARY ANGIOGRAPHY;  Surgeon: Iran Ouch, MD;  Location: [facility] INVASIVE CV LAB;  Service: Cardiovascular;  Laterality: N/A;  . REPLACEMENT TOTAL KNEE       A IV Location/Drains/Wounds Patient Lines/Drains/Airways Status   Active Line/Drains/Airways    Name:   Placement date:   Placement time:   Site:   Days:   Peripheral IV 05/12/18 Left Antecubital   05/12/18    0640    Antecubital   less than 1          Intake/Output Last 24 hours No intake or output data in the 24 hours ending 05/12/18 0758  Labs/Imaging Results for orders placed or performed during the hospital encounter of 05/12/18 (from the past 48 hour(s))  CBC     Status: Abnormal   Collection Time: 05/12/18  5:53 AM  Result Value Ref Range   WBC 13.9 (H) 4.0 - 10.5 K/uL   RBC 3.72 (L) 3.87 - 5.11 MIL/uL   Hemoglobin 11.1 (L) 12.0 -  15.0 g/dL   HCT 32.5 49.8 - 26.4 %   MCV 97.0 80.0 - 100.0 fL   MCH 29.8 26.0 - 34.0 pg   MCHC 30.7 30.0 - 36.0 g/dL   RDW 15.8 (H) 30.9 - 40.7 %   Platelets 163 150 - 400 K/uL   nRBC 0.0 0.0 - 0.2 %    Comment: Performed at [facility], [address] [place], [place] [address]  Comprehensive metabolic panel     Status: Abnormal   Collection Time: 05/12/18  5:53 AM  Result Value Ref Range   Sodium 140 135 - 145 mmol/L   Potassium 3.9 3.5 - 5.1 mmol/L    Comment: HEMOLYSIS AT THIS LEVEL MAY AFFECT RESULT   Chloride 105 98 - 111 mmol/L   CO2 27 22 - 32 mmol/L   Glucose, Bld 209 (H) 70 - 99 mg/dL   BUN 19 8 - 23  mg/dL   Creatinine, Ser 1.10 (H) 0.44 - 1.00 mg/dL   Calcium 8.9 8.9 - 31.5 mg/dL   Total Protein 6.7 6.5 - 8.1 g/dL   Albumin 3.8 3.5 - 5.0 g/dL   AST 31 15 - 41 U/L    Comment: HEMOLYSIS AT THIS LEVEL MAY AFFECT RESULT   ALT 17 0 - 44 U/L   Alkaline Phosphatase 73 38 - 126 U/L   Total Bilirubin 1.0 0.3 - 1.2 mg/dL    Comment: HEMOLYSIS AT THIS LEVEL MAY AFFECT RESULT   GFR calc non Af Amer 48 (L) >60 mL/min   GFR calc Af Amer 56 (L) >60 mL/min   Anion gap 8 5 - 15    Comment: Performed at [facility], [address] [place], [place] [address]   Dg Hip Unilat W Or Wo Pelvis 2-3 Views Left  Result Date: 05/12/2018 CLINICAL DATA:  Left hip pain after mechanical fall EXAM: DG HIP (WITH OR WITHOUT PELVIS) 2-3V LEFT COMPARISON:  None. FINDINGS: Pelvis radiograph is limited by overlapping EKG leads. There is no evidence of hip or pelvic ring fracture. No hip dislocation. IMPRESSION: Negative. Electronically Signed   By: Marnee Spring M.D.   On: 05/12/2018 06:30   Dg Femur Min 2 Views Left  Result Date: 05/12/2018 CLINICAL DATA:  Leg pain after fall EXAM: LEFT FEMUR 2 VIEWS COMPARISON:  None. FINDINGS: Primarily transverse distal femur fracture above a total knee arthroplasty. The fracture is displaced posteriorly by 50% with dorsal impaction. Osteopenia. IMPRESSION: Displaced distal femur fracture above a total knee arthroplasty. Electronically Signed   By: Marnee Spring M.D.   On: 05/12/2018 06:31    Pending Labs Unresulted Labs (From admission, onward)    Start     Ordered   05/19/18 0500  Creatinine, serum  (enoxaparin (LOVENOX)    CrCl >/= 30 ml/min)  Weekly,   STAT    Comments:  while on enoxaparin therapy    05/12/18 0749   05/13/18 0500  Basic metabolic panel  Tomorrow morning,   STAT     05/12/18 0749   05/13/18 0500  CBC  Tomorrow morning,   STAT     05/12/18 0749   05/12/18 0742  CBC  (enoxaparin (LOVENOX)    CrCl >/= 30 ml/min)  Once,   STAT    Comments:   Baseline for enoxaparin therapy IF NOT ALREADY DRAWN.  Notify MD if PLT < 100 K.    05/12/18 0749   05/12/18 0742  Creatinine, serum  (enoxaparin (LOVENOX)    CrCl >/=  30 ml/min)  Once,   STAT    Comments:  Baseline for enoxaparin therapy IF NOT ALREADY DRAWN.    05/12/18 0749          Vitals/Pain Today's Vitals   05/12/18 0630 05/12/18 0645 05/12/18 0700 05/12/18 0715  BP: 99/67  (!) 88/69 104/78  Pulse:  71 73   Resp: 15 19 16 19   Temp:      TempSrc:      SpO2:  100% 97%   Weight:      Height:      PainSc:        Isolation Precautions No active isolations  Medications Medications  ceFAZolin (ANCEF) IVPB 2g/100 mL premix (has no administration in time range)  ferrous sulfate tablet 325 mg (has no administration in time range)  vitamin B-12 (CYANOCOBALAMIN) tablet 500 mcg (has no administration in time range)  lamoTRIgine (LAMICTAL) tablet 200 mg (has no administration in time range)  Multi-Vitamins TABS 1 tablet (has no administration in time range)  topiramate (TOPAMAX) tablet 200 mg (has no administration in time range)  pregabalin (LYRICA) capsule 75 mg (has no administration in time range)  niacin tablet 500 mg (has no administration in time range)  potassium chloride SA (K-DUR,KLOR-CON) CR tablet 20 mEq (has no administration in time range)  mometasone-formoterol (DULERA) 200-5 MCG/ACT inhaler 2 puff (has no administration in time range)  albuterol (PROVENTIL) (2.5 MG/3ML) 0.083% nebulizer solution 2.5 mg (has no administration in time range)  albuterol (PROVENTIL HFA;VENTOLIN HFA) 108 (90 Base) MCG/ACT inhaler 2 puff (has no administration in time range)  tiotropium (SPIRIVA) inhalation capsule ([facility] use ONLY) 18 mcg (has no administration in time range)  guaiFENesin (MUCINEX) 12 hr tablet 600 mg (has no administration in time range)  cefTRIAXone (ROCEPHIN) 1 g in sodium chloride 0.9 % 100 mL IVPB (has no administration in time range)  0.9 %  sodium chloride  infusion (has no administration in time range)  acetaminophen (TYLENOL) tablet 650 mg (has no administration in time range)    Or  acetaminophen (TYLENOL) suppository 650 mg (has no administration in time range)  HYDROcodone-acetaminophen (NORCO/VICODIN) 5-325 MG per tablet 1-2 tablet (has no administration in time range)  traZODone (DESYREL) tablet 25 mg (has no administration in time range)  docusate sodium (COLACE) capsule 100 mg (has no administration in time range)  bisacodyl (DULCOLAX) EC tablet 5 mg (has no administration in time range)  ondansetron (ZOFRAN) tablet 4 mg (has no administration in time range)    Or  ondansetron (ZOFRAN) injection 4 mg (has no administration in time range)  enoxaparin (LOVENOX) injection 40 mg (has no administration in time range)  insulin aspart (novoLOG) injection 0-9 Units (has no administration in time range)  morphine 2 MG/ML injection 2 mg (2 mg Intravenous Given 05/12/18 0622)    Mobility walks High fall risk   Focused Assessments Cardiac Assessment Handoff:    Lab Results  Component Value Date   TROPONINI 1.50 (HH) 01/06/2018   No results found for: DDIMER Does the Patient currently have chest pain? No     R Recommendations: See Admitting Provider Note  Report given to:   Additional Notes:

## 2018-05-12 NOTE — H&P (Addendum)
[facility] [place] at [facility]   PATIENT NAME: Brenda Wu    MR#:  [id number]  DATE OF BIRTH:  [birth year]  DATE OF ADMISSION:  05/12/2018  PRIMARY CARE PHYSICIAN: Cheron Schaumann., MD   REQUESTING/REFERRING PHYSICIAN: Dr. Manson Passey  CHIEF COMPLAINT: Fall with knee pain   Chief Complaint  Patient presents with  . Fall    HISTORY OF PRESENT ILLNESS:  Brenda Wu  is a 63 y.o. female with a known history of combined systolic and diastolic heart failure, diabetes mellitus type 2 and a mechanical fall at home this morning at 1 AM and has been having severe left knee pain, found to have knee fracture.  Patient has swelling of the left leg, history of left knee replacement.  Patient is in severe pain in the left knee, patient knee x-ray showed displaced supracondylar fracture of distal femur with intact hardware.  Dr. Rosita Kea planning to do open reduction, internal fixation this afternoon if medically stable.  Patient denies chest pain, shortness of breath, lower extremity edema.  EKG has some T wave inversion but appears better than the EKG done on April 25, 2018.  Patient is not hypoxic saturation is 98% on 2 L, patient chronically on oxygen due to COPD.  PAST MEDICAL HISTORY:   Past Medical History:  Diagnosis Date  . Chronic combined systolic (congestive) and diastolic (congestive) heart failure (HCC)    a. 10/2017 Echo: EF 45-50%, diff HK. Mild to mod MR, mildly dil LA. Mod dil RV w/ mildly reduced RV fxn.  [name] COPD (chronic obstructive pulmonary disease) (HCC)   . Depression   . Diabetes mellitus without complication (HCC)   . Hypercholesteremia   . Hypertension   . Morbid obesity (HCC)   . NICM (nonischemic cardiomyopathy) (HCC)   . NSTEMI (non-ST elevated myocardial infarction) (HCC)    a. 12/2017 -->Cath: nl cors. EF 45-50%.  . Seizures (HCC)   . Stroke (HCC)   . Tricuspid regurgitation    a. 10/2017 Echo: Mild to mod TR.    PAST  SURGICAL HISTOIRY:   Past Surgical History:  Procedure Laterality Date  . ABDOMINAL HYSTERECTOMY    . LEFT HEART CATH AND CORONARY ANGIOGRAPHY N/A 01/09/2018   Procedure: LEFT HEART CATH AND CORONARY ANGIOGRAPHY;  Surgeon: Iran Ouch, MD;  Location: [facility] INVASIVE CV LAB;  Service: Cardiovascular;  Laterality: N/A;  . REPLACEMENT TOTAL KNEE      SOCIAL HISTORY:   Social History   Tobacco Use  . Smoking status: Former Smoker    Packs/day: 1.00    Years: 30.00    Pack years: 30.00    Types: Cigarettes    Last attempt to quit: 02/22/2002    Years since quitting: 16.2  . Smokeless tobacco: Never Used  Substance Use Topics  . Alcohol use: No    FAMILY HISTORY:   Family History  Problem Relation Age of Onset  . Hypertension Father   . Asthma Sister   . Heart murmur Sister   . Diabetes Sister     DRUG ALLERGIES:   Allergies  Allergen Reactions  . Metformin Other (See Comments)    Other reaction(s): Other (See Comments) Constipation, dry mouth, dizziness  . Bismuth Subsalicylate Rash    Pepto Bismol    REVIEW OF SYSTEMS:  CONSTITUTIONAL: No fever, fatigue or weakness.  EYES: No blurred or double vision.  EARS, NOSE, AND THROAT: No tinnitus or ear pain.  RESPIRATORY: No cough, shortness of breath, wheezing  or hemoptysis.  CARDIOVASCULAR: No chest pain, orthopnea, edema.  GASTROINTESTINAL: No nausea, vomiting, diarrhea or abdominal pain.  GENITOURINARY: No dysuria, hematuria.  ENDOCRINE: No polyuria, nocturia,  HEMATOLOGY: No anemia, easy bruising or bleeding SKIN: No rash or lesion. MUSCULOSKELETAL: Left knee pain, swelling in the left knee, NEUROLOGIC: No tingling, numbness, weakness.  PSYCHIATRY: No anxiety or depression.   MEDICATIONS AT HOME:   Prior to Admission medications   Medication Sig Start Date End Date Taking? Authorizing Provider  albuterol (PROVENTIL HFA;VENTOLIN HFA) 108 (90 Base) MCG/ACT inhaler Inhale 2 puffs into the lungs 4 (four)  times daily as needed for wheezing or shortness of breath.  01/05/16  Yes [provider]  albuterol (PROVENTIL) (2.5 MG/3ML) 0.083% nebulizer solution Inhale 2.5 mg into the lungs every 6 (six) hours as needed for wheezing or shortness of breath.  10/01/16  Yes [provider]  aspirin EC 81 MG tablet Take 1 tablet (81 mg total) by mouth daily. 02/13/18  Yes [name], MD  atorvastatin (LIPITOR) 40 MG tablet Take 40 mg by mouth every evening.    Yes [provider]  budesonide-formoterol (SYMBICORT) 160-4.5 MCG/ACT inhaler Inhale 2 puffs into the lungs 2 (two) times daily. 05/22/15  Yes [provider]  diltiazem (CARDIZEM CD) 180 MG 24 hr capsule Take 180 mg by mouth daily.   Yes [provider]  ferrous sulfate 325 (65 FE) MG tablet Take 325 mg by mouth daily with breakfast.   Yes [provider]  glipiZIDE (GLUCOTROL) 10 MG tablet Take 10 mg by mouth 2 (two) times daily before a meal.    Yes [provider]  guaiFENesin (MUCINEX) 600 MG 12 hr tablet Take 600 mg by mouth 2 (two) times daily as needed for cough.    Yes [provider]  Insulin Glargine (LANTUS SOLOSTAR) 100 UNIT/ML Solostar Pen Inject 20 Units into the skin daily.  01/21/17  Yes [provider]  lamoTRIgine (LAMICTAL) 200 MG tablet Take 200 mg by mouth 2 (two) times daily.    Yes [provider]  losartan (COZAAR) 50 MG tablet Take 50 mg by mouth daily.   Yes [provider]  Multiple Vitamin (MULTI-VITAMINS) TABS Take 1 tablet by mouth daily.   Yes [provider]  niacin 500 MG tablet Take 500 mg by mouth daily.   Yes [provider]  potassium chloride SA (K-DUR,KLOR-CON) 20 MEQ tablet Take 1 tablet (20 mEq total) by mouth daily. 02/12/17  Yes [name], MD  pregabalin (LYRICA) 75 MG capsule Take 75 mg by mouth 2 (two) times daily.   Yes [provider]  senna (SENOKOT) 8.6 MG TABS tablet  Take 1 tablet by mouth daily.    Yes [provider]  sitaGLIPtin (JANUVIA) 50 MG tablet Take 50 mg by mouth daily.   Yes [provider]  tiotropium (SPIRIVA) 18 MCG inhalation capsule Place 18 mcg into inhaler and inhale daily.   Yes [provider]  topiramate (TOPAMAX) 200 MG tablet Take 200 mg by mouth 2 (two) times daily.   Yes [provider]  torsemide (DEMADEX) 20 MG tablet Take 2 tablets (40 mg total) by mouth daily. 01/26/18  Yes Creig Hines, NP  vitamin B-12 (CYANOCOBALAMIN) 500 MCG tablet Take 500 mcg by mouth daily.   Yes [provider]      VITAL SIGNS:  Blood pressure 104/78, pulse 73, temperature 98 F (36.7 C), temperature source Oral, resp. rate 19, height  5\' 4"  (1.626 m), weight 93.5 kg, SpO2 97 %.  PHYSICAL EXAMINATION:  GENERAL:  63 y.o.-year-old patient lying in the bed with no acute distress.  EYES: Pupils equal, round, reactive to light . No scleral icterus. Extraocular muscles intact.  HEENT: Head atraumatic, normocephalic. Oropharynx and nasopharynx clear.  NECK:  Supple, no jugular venous distention. No thyroid enlargement, no tenderness.  LUNGS: Normal breath sounds bilaterally, no wheezing, rales,rhonchi or crepitation. No use of accessory muscles of respiration.  CARDIOVASCULAR: S1, S2 normal. No murmurs, rubs, or gallops.  ABDOMEN: Soft, nontender, nondistended. Bowel sounds present. No organomegaly or mass.  EXTREMITIES: No pedal edema, cyanosis, or clubbing.  Patient has swelling of left her distal knee , intact pulses in dorsalis pedis, sensations are intact. NEUROLOGIC: Cranial nerves II through XII are intact. Muscle strength 5/5 in all extremities. Sensation intact. Gait not checked.  PSYCHIATRIC: The patient is alert and oriented x 3.  SKIN: No obvious rash, lesion, or ulcer.   LABORATORY PANEL:   CBC Recent Labs  Lab 05/12/18 0553  WBC 13.9*  HGB 11.1*  HCT 36.1  PLT 163    ------------------------------------------------------------------------------------------------------------------  Chemistries  Recent Labs  Lab 05/12/18 0553  NA 140  K 3.9  CL 105  CO2 27  GLUCOSE 209*  BUN 19  CREATININE 1.21*  CALCIUM 8.9  AST 31  ALT 17  ALKPHOS 73  BILITOT 1.0   ------------------------------------------------------------------------------------------------------------------  Cardiac Enzymes No results for input(s): TROPONINI in the last 168 hours. ------------------------------------------------------------------------------------------------------------------  RADIOLOGY:  Dg Hip Unilat W Or Wo Pelvis 2-3 Views Left  Result Date: 05/12/2018 CLINICAL DATA:  Left hip pain after mechanical fall EXAM: DG HIP (WITH OR WITHOUT PELVIS) 2-3V LEFT COMPARISON:  None. FINDINGS: Pelvis radiograph is limited by overlapping EKG leads. There is no evidence of hip or pelvic ring fracture. No hip dislocation. IMPRESSION: Negative. Electronically Signed   By: Marnee Spring M.D.   On: 05/12/2018 06:30   Dg Femur Min 2 Views Left  Result Date: 05/12/2018 CLINICAL DATA:  Leg pain after fall EXAM: LEFT FEMUR 2 VIEWS COMPARISON:  None. FINDINGS: Primarily transverse distal femur fracture above a total knee arthroplasty. The fracture is displaced posteriorly by 50% with dorsal impaction. Osteopenia. IMPRESSION: Displaced distal femur fracture above a total knee arthroplasty. Electronically Signed   By: Marnee Spring M.D.   On: 05/12/2018 06:31    EKG:   Orders placed or performed in visit on 05/12/18  . EKG 12-Lead   EKG shows sinus rhythm at 75 bpm.  With T wave inversion lateral leads, not changed from before. IMPRESSION AND PLAN:  63 year old female patient with history of chronic combined, systolic and diastolic heart failure followed by heart failure clinic, diabetes mellitus type 2, chronic oxygen dependent COPD comes in with fall this morning suffered left  knee supracondylar fracture.  Labs reviewed, EKG reviewed, EKG not showing any new changes, preop chest x-ray is not done so I reordered it.  Chest x-ray is done, chest x-ray is negative for any active disease. Patient is at moderate risk for given surgery for knee due to advanced age, multiple medical problems.  But surgery can be performed. 2.  Hypotension likely secondary to morphine that she received.  We are holding all her BP medicines for now, giving gentle hydration. 3.  Diabetes mellitus type 2: As she is n.p.o. we will do sliding scale insulin with coverage, hold glipizide, Lantus today, resume tomorrow. 4.  COPD, chronic respiratory failure, oxygen dependent at  2 L, no wheezing, continue her Symbicort, albuterol neb.  #5 essential hypertension, patient now hypotensive so hold the Cardizem, #6. chronic systolic and diastolic heart failure, EF 40% by previous echoes, patient slightly hypotensive so hold torsemide now.  Patient appears dry.  #7. history of seizures, patient on Lamictal.  Continue Lamictal. All the records are reviewed and case discussed with ED provider. Management plans discussed with the patient, family and they are in agreement.  CODE STATUS: Full code  TOTAL TIME TAKING CARE OF THIS PATIENT: 55 minutes.    Katha Hamming M.D on 05/12/2018 at 8:06 AM  Between 7am to 6pm - Pager - [phone]  After 6pm go to [email or web address] - password [email or web address] [facility]  [facility]  Office  [phone]  CC: Primary care physician; Cheron Schaumann., MD  Note: This dictation was prepared with Dragon dictation along with smaller phrase technology. Any transcriptional errors that result from this process are unintentional.

## 2018-05-13 LAB — HEMOGLOBIN A1C
Hgb A1c MFr Bld: 9 % — ABNORMAL HIGH (ref 4.8–5.6)
Mean Plasma Glucose: 211.6 mg/dL

## 2018-05-13 LAB — URINALYSIS, COMPLETE (UACMP) WITH MICROSCOPIC
Bilirubin Urine: NEGATIVE
Glucose, UA: NEGATIVE mg/dL
Hgb urine dipstick: NEGATIVE
Ketones, ur: NEGATIVE mg/dL
Leukocytes,Ua: NEGATIVE
Nitrite: NEGATIVE
PH: 6 (ref 5.0–8.0)
Protein, ur: NEGATIVE mg/dL
Specific Gravity, Urine: 1.013 (ref 1.005–1.030)

## 2018-05-13 LAB — BASIC METABOLIC PANEL
Anion gap: 8 (ref 5–15)
BUN: 17 mg/dL (ref 8–23)
CO2: 25 mmol/L (ref 22–32)
Calcium: 8.5 mg/dL — ABNORMAL LOW (ref 8.9–10.3)
Chloride: 106 mmol/L (ref 98–111)
Creatinine, Ser: 1.07 mg/dL — ABNORMAL HIGH (ref 0.44–1.00)
GFR calc Af Amer: >60 mL/min (ref >60)
GFR calc non Af Amer: 56 mL/min — ABNORMAL LOW (ref >60)
Glucose, Bld: 149 mg/dL — ABNORMAL HIGH (ref 70–99)
Potassium: 3.6 mmol/L (ref 3.5–5.1)
Sodium: 139 mmol/L (ref 135–145)

## 2018-05-13 LAB — CBC
HCT: 30.4 % — ABNORMAL LOW (ref 36.0–46.0)
Hemoglobin: 9.2 g/dL — ABNORMAL LOW (ref 12.0–15.0)
MCH: 29.9 pg (ref 26.0–34.0)
MCHC: 30.3 g/dL (ref 30.0–36.0)
MCV: 98.7 fL (ref 80.0–100.0)
NRBC: 0 % (ref 0.0–0.2)
Platelets: 123 10*3/uL — ABNORMAL LOW (ref 150–400)
RBC: 3.08 MIL/uL — ABNORMAL LOW (ref 3.87–5.11)
RDW: 16.2 % — ABNORMAL HIGH (ref 11.5–15.5)
WBC: 8 10*3/uL (ref 4.0–10.5)

## 2018-05-13 LAB — GLUCOSE, CAPILLARY
Glucose-Capillary: 143 mg/dL — ABNORMAL HIGH (ref 70–99)
Glucose-Capillary: 188 mg/dL — ABNORMAL HIGH (ref 70–99)
Glucose-Capillary: 115 mg/dL — ABNORMAL HIGH (ref 70–99)
Glucose-Capillary: 130 mg/dL — ABNORMAL HIGH (ref 70–99)

## 2018-05-13 MED ORDER — TRAMADOL HCL 50 MG PO TABS
50.0000 mg | ORAL_TABLET | Freq: Four times a day (QID) | ORAL | Status: DC
  Administered 2018-05-13: 100 mg via ORAL
  Filled 2018-05-13: qty 2

## 2018-05-13 MED ORDER — TRAMADOL HCL 50 MG PO TABS
50.0000 mg | ORAL_TABLET | Freq: Four times a day (QID) | ORAL | Status: DC
  Administered 2018-05-13 – 2018-05-16 (×9): 50 mg via ORAL
  Filled 2018-05-13 (×9): qty 1

## 2018-05-13 MED ORDER — LORAZEPAM 2 MG/ML IJ SOLN: 1.0000 mg | Freq: Four times a day (QID) | INTRAMUSCULAR | Status: DC | PRN

## 2018-05-13 MED ORDER — MORPHINE SULFATE (PF) 2 MG/ML IV SOLN
2.0000 mg | INTRAVENOUS | Status: DC | PRN
  Administered 2018-05-14: 2 mg via INTRAVENOUS
  Filled 2018-05-13: qty 1

## 2018-05-13 NOTE — Progress Notes (Signed)
   05/13/18 1500  Clinical Encounter Type  Visited With Patient  Visit Type Initial;Psychological support;Spiritual support;Social support;Post-op  Referral From Nurse  Spiritual Encounters  Spiritual Needs Emotional;Grief support;Other (Comment)

## 2018-05-13 NOTE — Progress Notes (Signed)
Pt refused Tramadol stating it doesn't work. This RN attempted to educate pt on purpose of medication. Pt stated she was in pain but when this RN attempted to give her prn scheduled pain med but this RN felt pt was not awake enough to take med aeb pt not awake enough to take med. Will monitor for alertness and pain mgmt.

## 2018-05-13 NOTE — Progress Notes (Signed)
PT Cancellation Note  Patient Details Name: Brenda Wu MRN: [id number] DOB: May 23, 1955   Cancelled Treatment:    Reason Eval/Treat Not Completed: Pain limiting ability to participate;Medical issues which prohibited therapy. Patient was in recliner chair, and pain medicine had been recently taken.  Patient had anxiety and difficulty with breathing during the first 10 minutes of evaluation. Patients  O2 saturation was 98% . Nursing was called and the nurse called rapid response due to patient not feeling like she could take a breath and asking for her inhaler. Evaluation was stopped due to inability to participate, and patient was left with nursing in the room.     [address] [place], [place] DPT 05/13/2018, 2:17 PM

## 2018-05-13 NOTE — Evaluation (Signed)
Occupational Therapy Evaluation Patient Details Name: Brenda Wu MRN: [id number] DOB: January 15, 1956 Today's Date: 05/13/2018    History of Present Illness Brenda Wu  is a 63 y.o. female with a known history of combined systolic and diastolic heart failure, diabetes mellitus type 2 and a mechanical fall at home this morning at 1 AM and has been having severe left knee pain, found to have knee fracture. Pt is s/p R ORIF distal femur fx of periprosthetic   Clinical Impression   Pt admitted with above diagnoses, with LLE post surgical pain and cardiopulmonary status limiting ability to engage in BADL at desired level of ind. PTA pt reports living alone and ind carrying out BADL/IADL. Pt friend present during session, unclearly stating whether or not he is available for assist at d/c. Upon evaluation, pt with increased pain with all mobility. Pt complete bed mobility at min guard with excess increase in time to use sock to help guide leg, not letting OT intervene at all. Pt needing mod A for sit > stands at this time and VC's for hand placement and LLE placement. Pt able to recall precautions, but needing continued cues at this time to maintain and for appropriate walker usage. Pt fearful of RW stating "it made me fall". Pt transferred to [facility] with mod A, and mod- max back to recliner with one rest break and episode of dizziness. Pt states she became anxious which increased pain. At this time recommending pt d/c to SNF level of care to maximize safety and ind in BADL activity. Will continue to follow per OT POC.    Follow Up Recommendations  SNF    Equipment Recommendations  Other (comment)(Defer to next venue)    Recommendations for Other Services       Precautions / Restrictions Precautions Precautions: Fall Restrictions Weight Bearing Restrictions: Yes LLE Weight Bearing: Touchdown weight bearing      Mobility Bed Mobility Overal bed mobility: Needs Assistance Bed Mobility:  Supine to Sit     Supine to sit: Min guard     General bed mobility comments: extra increase in time, pt persistence to due ind. Slowly using sock to pull LLE to EOB, would not let OT touch or assist  Transfers Overall transfer level: Needs assistance Equipment used: Rolling walker (2 wheeled) Transfers: Sit to/from Stand Sit to Stand: Mod assist         General transfer comment: mod A to power up to standing, VC's for hand placement. Pt stating at first to "not using that walker because it made me fall"    Balance Overall balance assessment: Needs assistance Sitting-balance support: Bilateral upper extremity supported Sitting balance-Leahy Scale: Fair Sitting balance - Comments: needing BUE support to guard from pain   Standing balance support: Bilateral upper extremity supported;During functional activity Standing balance-Leahy Scale: Poor Standing balance comment: heavy reliance on BUE support                           ADL either performed or assessed with clinical judgement   ADL Overall ADL's : Needs assistance/impaired Eating/Feeding: Independent;Sitting   Grooming: Set up;Sitting Grooming Details (indicate cue type and reason): umable to tolerate standing, reliant on ext support Upper Body Bathing: Set up;Sitting   Lower Body Bathing: Sit to/from stand;Sitting/lateral leans;Cueing for compensatory techniques;Maximal assistance   Upper Body Dressing : Set up;Sitting   Lower Body Dressing: Maximal assistance;Sit to/from stand;Sitting/lateral leans;Cueing for compensatory techniques   Toilet Transfer: Moderate  assistance;BSC;RW Toilet Transfer Details (indicate cue type and reason): mod A for power to stand and to guide pt with RW Toileting- Clothing Manipulation and Hygiene: Minimal assistance;Sit to/from stand;Sitting/lateral lean   Tub/ Shower Transfer: Moderate assistance;Shower seat;Tub bench;Rolling walker   Functional mobility during ADLs:  Minimal assistance;Moderate assistance;Rolling walker General ADL Comments: pt with increased pain this date and compromised cardiopulm status, breathing tx gave during session, pt needing mod A for t/f at this time, but min guard for bed mobility with extra time due to pt persistence to due ind     Vision Patient Visual Report: No change from baseline       Perception     Praxis      Pertinent Vitals/Pain Pain Assessment: Faces Faces Pain Scale: Hurts whole lot Pain Location: LLE Pain Descriptors / Indicators: Aching;Guarding;Grimacing;Sore;Spasm Pain Intervention(s): Limited activity within patient's tolerance;Monitored during session;Repositioned     Hand Dominance     Extremity/Trunk Assessment Upper Extremity Assessment Upper Extremity Assessment: Generalized weakness   Lower Extremity Assessment Lower Extremity Assessment: Defer to PT evaluation       Communication Communication Communication: No difficulties   Cognition Arousal/Alertness: Awake/alert Behavior During Therapy: WFL for tasks assessed/performed Overall Cognitive Status: Within Functional Limits for tasks assessed                                     General Comments  dressing intact on LLE, noted bleeding in bed but this was from prior dressing change    Exercises     Shoulder Instructions      Home Living Family/patient expects to be discharged to:: Private residence Living Arrangements: Alone Available Help at Discharge: Friend(s) Type of Home: House Home Access: Level entry     Home Layout: One level     Bathroom Shower/Tub: Tub/shower unit         Home Equipment: Environmental consultant - 2 wheels;Tub bench          Prior Functioning/Environment Level of Independence: Independent with assistive device(s)        Comments: Pt uses 2LO2 at baseline, reports ind completing BADL with increased time. Pt also drives        OT Problem List: Decreased strength;Decreased  activity tolerance;Decreased knowledge of use of DME or AE;Cardiopulmonary status limiting activity;Impaired balance (sitting and/or standing);Decreased coordination;Obesity;Pain      OT Treatment/Interventions: Self-care/ADL training;DME and/or AE instruction;Therapeutic activities;Balance training;Therapeutic exercise;Energy conservation;Patient/family education    OT Goals(Current goals can be found in the care plan section) Acute Rehab OT Goals Patient Stated Goal: to get back to ind OT Goal Formulation: With patient Time For Goal Achievement: 05/27/18 Potential to Achieve Goals: Good  OT Frequency: Min 2X/week   Barriers to D/C:            Co-evaluation              AM-PAC OT "6 Clicks" Daily Activity     Outcome Measure Help from another person eating meals?: None Help from another person taking care of personal grooming?: None(seated) Help from another person toileting, which includes using toliet, bedpan, or urinal?: A Lot Help from another person bathing (including washing, rinsing, drying)?: A Lot Help from another person to put on and taking off regular upper body clothing?: None Help from another person to put on and taking off regular lower body clothing?: A Lot 6 Click Score: 18   End of Session  Equipment Utilized During Treatment: Gait belt;Rolling walker;Oxygen Nurse Communication: Mobility status  Activity Tolerance: Patient tolerated treatment well Patient left: in chair;with call bell/phone within reach;with family/visitor present;with nursing/sitter in room  OT Visit Diagnosis: Other abnormalities of gait and mobility (R26.89);History of falling (Z91.81);Muscle weakness (generalized) (M62.81);Pain;Unsteadiness on feet (R26.81) Pain - Right/Left: Left Pain - part of body: Leg;Hip                Time: 1023-1110 OT Time Calculation (min): 47 min Charges:  OT General Charges $OT Visit: 1 Visit OT Evaluation $OT Eval Moderate Complexity: 1 Mod OT  Treatments $Self Care/Home Management : 23-37 mins  Dalphine Handing, MSOT, OTR/L Behavioral Health OT/ Acute Relief OT Dalphine Handing 05/13/2018, 1:21 PM

## 2018-05-13 NOTE — Progress Notes (Signed)
  Subjective: 1 Day Post-Op Procedure(s) (LRB): OPEN REDUCTION INTERNAL FIXATION (ORIF) DISTAL FEMUR FRACTURE (Left) Patient reports pain as moderate.   Patient is well, and has had no acute complaints or problems PT and Care management to assist with discharge planning. Negative for chest pain and shortness of breath Fever: no Gastrointestinal:Negative for nausea and vomiting  Objective: Vital signs in last 24 hours: Temp:  [97.8 F (36.6 C)-100 F (37.8 C)] 98.2 F (36.8 C) (03/21 0729) Pulse Rate:  [80-103] 87 (03/21 0729) Resp:  [13-22] 18 (03/21 0357) BP: (82-112)/(46-78) 112/65 (03/21 0729) SpO2:  [95 %-100 %] 100 % (03/21 0729) Weight:  [113.4 kg] 113.4 kg (03/21 0438)  Intake/Output from previous day:  Intake/Output Summary (Last 24 hours) at 05/13/2018 0922 Last data filed at 05/13/2018 0402 Gross per 24 hour  Intake 1474.47 ml  Output 752 ml  Net 722.47 ml    Intake/Output this shift: No intake/output data recorded.  Labs: Recent Labs    05/12/18 0553 05/13/18 0331  HGB 11.1* 9.2*   Recent Labs    05/12/18 0553 05/13/18 0331  WBC 13.9* 8.0  RBC 3.72* 3.08*  HCT 36.1 30.4*  PLT 163 123*   Recent Labs    05/12/18 0553 05/13/18 0331  NA 140 139  K 3.9 3.6  CL 105 106  CO2 27 25  BUN 19 17  CREATININE 1.21* 1.07*  GLUCOSE 209* 149*  CALCIUM 8.9 8.5*   Recent Labs    05/12/18 0553  INR 1.0     EXAM General - Patient is Alert, Appropriate and Oriented Extremity - ABD soft Sensation intact distally Intact pulses distally Dorsiflexion/Plantar flexion intact Incision: moderate drainage No cellulitis present Dressing/Incision - blood tinged drainage to the more distal incision. Motor Function - intact, moving foot and toes well on exam.  Abdomen soft with normal BS.  Past Medical History:  Diagnosis Date  . Chronic combined systolic (congestive) and diastolic (congestive) heart failure (HCC)    a. 10/2017 Echo: EF 45-50%, diff HK. Mild  to mod MR, mildly dil LA. Mod dil RV w/ mildly reduced RV fxn.  [name] COPD (chronic obstructive pulmonary disease) (HCC)   . Depression   . Diabetes mellitus without complication (HCC)   . Hypercholesteremia   . Hypertension   . Morbid obesity (HCC)   . NICM (nonischemic cardiomyopathy) (HCC)   . NSTEMI (non-ST elevated myocardial infarction) (HCC)    a. 12/2017 -->Cath: nl cors. EF 45-50%.  . Seizures (HCC)   . Stroke (HCC)   . Tricuspid regurgitation    a. 10/2017 Echo: Mild to mod TR.    Assessment/Plan: 1 Day Post-Op Procedure(s) (LRB): OPEN REDUCTION INTERNAL FIXATION (ORIF) DISTAL FEMUR FRACTURE (Left) Active Problems:   Fracture of prosthetic knee (HCC)  Estimated body mass index is 42.91 kg/m as calculated from the following:   Height as of this encounter: 5\' 4"  (1.626 m).   Weight as of this encounter: 113.4 kg. Advance diet Up with therapy D/C IV fluids when tolerating po intake.  Labs reviewed this AM, Hg 9.3, continue to monitor.  CBC and BMP ordered for tomorrow morning. WBC 8.0 this AM. Up with therapy today. Begin working on BM.  DVT Prophylaxis - Lovenox, Foot Pumps and TED hose Toe-touch weightbearing to the left lower extremity.  Valeria Batman, PA-C [facility] Orthopaedic Surgery 05/13/2018, 9:22 AM

## 2018-05-13 NOTE — Clinical Social Work Note (Signed)
The CSW received a consult for SNF placement. The patient has a pending PT evaluation. TOC team is following for SNF vs. HH.  Brenda Wu, MSW, LCSW [phone]

## 2018-05-13 NOTE — TOC Initial Note (Signed)
Transition of Care [facility]) - Initial/Assessment Note    Patient Details  Name: Brenda Wu MRN: [id number] Date of Birth: 1955-08-06  Transition of Care [facility]) CM/SW Contact:    Zettie Pho, LCSW Phone Number: 05/13/2018, 2:30 PM  Clinical Narrative:  The CSW met with the patient and her ex-husband Liliane Channel at bedside to discuss discharge planning. The CSW explained the OT recommendation for SNF. The patient declined SNF and shared that she would prefer to return home with home health. The client admits that she lives alone; however, her ex-husband Liliane Channel plans to stay with her 24h to assist with family taking respite shifts. The patient named [facility] as her preference due to having used them in the past.  According to the [facility], the patient has a RW and Rollator at home, NIV and Chron 02 at home thru [facility], was provided with a scale last visit, uses [employer] and [employer] order, lives alone. The patient has been evaluated by OT with a recommendation of SNF; PT evaluation is pending due to pain and anxiety as prohibiting factors at the time of evaluation. The patient is an extreme risk of readmission at 32%, and she has a COPD Gold banner. The patient's last admission was 01/05/2018 due to acute respiratory failure, and she has had 2 ED visits in the past 30 days; once for viral gastroenteritis and once for dehydration. The TOC will continue to follow for discharge planning and facilitation.    Expected Discharge Plan: [place] Barriers to Discharge: Continued Medical Work up   Patient Goals and CMS Choice Patient states their goals for this hospitalization and ongoing recovery are:: "I want to be able to walk at least a few feet." CMS Medicare.gov Compare Post Acute Care list provided to:: Patient Choice offered to / list presented to : Patient  Expected Discharge Plan and Services Expected Discharge Plan: [place]   Discharge  Planning Services: Follow-up appt scheduled Post Acute Care Choice: Home Health Living arrangements for the past 2 months: Single Family Home                 DME Arranged: N/A DME Agency: NA HH Arranged: OT, PT, Nurse's Aide, NIV [place] Agency: [place] ([facility])  Prior Living Arrangements/Services Living arrangements for the past 2 months: Single Family Home Lives with:: Self Patient language and need for interpreter reviewed:: Yes Do you feel safe going back to the place where you live?: Yes      Need for Family Participation in Patient Care: No (Comment) Care giver support system in place?: Yes (comment) Current home services: DME Criminal Activity/Legal Involvement Pertinent to Current Situation/Hospitalization: No - Comment as needed  Activities of Daily Living Home Assistive Devices/Equipment: Dentures (specify type), Walker (specify type) ADL Screening (condition at time of admission) Patient's cognitive ability adequate to safely complete daily activities?: Yes Is the patient deaf or have difficulty hearing?: No Does the patient have difficulty seeing, even when wearing glasses/contacts?: No Does the patient have difficulty concentrating, remembering, or making decisions?: No Patient able to express need for assistance with ADLs?: Yes Does the patient have difficulty dressing or bathing?: No Independently performs ADLs?: Yes (appropriate for developmental age) Does the patient have difficulty walking or climbing stairs?: Yes Weakness of Legs: Left Weakness of Arms/Hands: None  Permission Sought/Granted Permission sought to share information with : Family Supports Permission granted to share information with : Yes, Verbal Permission Granted  Emotional Assessment Appearance:: Appears stated age Attitude/Demeanor/Rapport: Engaged, Gracious Affect (typically observed): Stable Orientation: : Oriented to Self, Oriented to Place, Oriented to  Time,  Oriented to Situation Alcohol / Substance Use: Never Used Psych Involvement: No (comment)  Admission diagnosis:  Pain [R52] Pre-op chest exam [Z01.811] Displaced comminuted fracture of shaft of right femur, initial encounter for closed fracture [facility]) [S72.351A] Patient Active Problem List   Diagnosis Date Noted  . Fracture of prosthetic knee ([place]) 05/12/2018  . NICM (nonischemic cardiomyopathy) ([place]) 02/13/2018  . Non-ST elevation (NSTEMI) myocardial infarction ([place])   . Chronic combined systolic and diastolic heart failure ([place]) 12/12/2017  . HTN (hypertension) 12/12/2017  . Diabetes ([place]) 12/12/2017  . COPD exacerbation ([place]) 02/07/2017  . History of cerebellar stroke 03/12/2016  . History of seizures 03/12/2016  . Pulmonary nodule, left 01/05/2016  . Paroxysmal atrial fibrillation ([place]) 10/17/2015  . Cerebellar stroke ([place]) 06/26/2015  . Transient diplopia 06/09/2015  . Asthma-chronic obstructive pulmonary disease overlap syndrome ([place]) 05/30/2015  . COPD with asthma ([place]) 05/30/2015  . Hypoxemia 05/30/2015  . Obstructive sleep apnea syndrome 05/30/2015  . OSA on CPAP 05/30/2015  . Obesity (BMI 30-39.9) 03/12/2015  . Bilateral hearing loss 01/22/2015  . Generalized seizure disorder ([place]) 01/22/2015  . Hyperlipidemia associated with type 2 diabetes mellitus ([place]) 01/22/2015  . Hypertension associated with diabetes ([place]) 01/22/2015  . Morbid obesity due to excess calories ([place]) 01/22/2015  . Primary osteoarthritis involving multiple joints 01/22/2015  . Severe episode of recurrent major depressive disorder, without psychotic features ([place]) 01/22/2015   PCP:  Loraine Leriche., MD Pharmacy:   [facility], [address] [place] [address] Phone: [phone] Fax: [phone]  [facility] - [place], [address] [place] [address] Phone: [phone] Fax:  [phone]     Social Determinants of Health (SDOH) Interventions    Readmission Risk Interventions No flowsheet data found.

## 2018-05-13 NOTE — Significant Event (Signed)
Rapid Response Event Note  Overview: Time Called: 1201 Arrival Time: 1204 Event Type: Other (Comment), Respiratory(pain)  Initial Focused Assessment: called for rapid response on patient POD1 knee surgery, who had just gotten to chair and finished with PT visit.   Interventions: Pt up in chair, VSS, states wears home 02 (02 is in place), rates pain 10/10.  Plan of Care (if not transferred): RN Darien Ramus and Marchelle Folks getting in touch with Dr Rosita Kea group for breakthrough pain control orders. Will call if further assistance needed.  Event Summary: Name of Physician Notified: Rosita Kea (or covering) at 1215    at    Outcome: Stayed in room and stabalized     Quintavious Rinck A

## 2018-05-13 NOTE — Progress Notes (Signed)
Rapid called on patient because during PT she became anxious stating that she was in severe pain. Had just been given 100 mg tramadol and 500 mg tylenol prior to working with PT. Doctor ordered ativan 1 mg but patient refused stating it is not anxiety and that it is pain only. Holding morphine at this time because patient just had tramadol and blood pressure is 93/62. Will assess pain again within one hour of tramadol being given.

## 2018-05-13 NOTE — Anesthesia Postprocedure Evaluation (Signed)
Anesthesia Post Note  Patient: Brenda Wu  Procedure(s) Performed: OPEN REDUCTION INTERNAL FIXATION (ORIF) DISTAL FEMUR FRACTURE (Left Leg Lower)  Patient location during evaluation: PACU Anesthesia Type: Spinal Level of consciousness: oriented and awake and alert Pain management: pain level controlled Vital Signs Assessment: post-procedure vital signs reviewed and stable Respiratory status: spontaneous breathing, respiratory function stable and patient connected to nasal cannula oxygen Cardiovascular status: blood pressure returned to baseline and stable Postop Assessment: no headache, no backache and no apparent nausea or vomiting Anesthetic complications: no     Last Vitals:  Vitals:   05/13/18 0357 05/13/18 0729  BP: 106/68 112/65  Pulse: 89 87  Resp: 18   Temp: 36.9 C 36.8 C  SpO2: 98% 100%    Last Pain:  Vitals:   05/13/18 0909  TempSrc:   PainSc: 4      Pt is coughing, some shortness of breath. SVN with duoneb.            Malone Vanblarcom S

## 2018-05-13 NOTE — Progress Notes (Signed)
[facility] [place] at [facility]   PATIENT NAME: Brenda Wu    MR#:  [id number]  DATE OF BIRTH:  November 01, 1955  SUBJECTIVE:  CHIEF COMPLAINT:   Chief Complaint  Patient presents with   Fall   -Status post left periprosthetic knee fracture repair.  Postop day 1.  Complains of significant pain around the knee  REVIEW OF SYSTEMS:  Review of Systems  Constitutional: Negative for chills, fever and malaise/fatigue.  HENT: Negative for congestion, ear discharge, hearing loss and nosebleeds.   Eyes: Negative for blurred vision and double vision.  Respiratory: Negative for cough, shortness of breath and wheezing.   Cardiovascular: Negative for chest pain and palpitations.  Gastrointestinal: Negative for abdominal pain, constipation, diarrhea, nausea and vomiting.  Genitourinary: Negative for dysuria.  Musculoskeletal: Positive for joint pain and myalgias.  Neurological: Negative for dizziness, seizures and headaches.  Psychiatric/Behavioral: Negative for depression.    DRUG ALLERGIES:   Allergies  Allergen Reactions   Metformin Other (See Comments)    Other reaction(s): Other (See Comments) Constipation, dry mouth, dizziness   Bismuth Subsalicylate Rash    Pepto Bismol    VITALS:  Blood pressure (!) 113/56, pulse 91, temperature 98 F (36.7 C), temperature source Oral, resp. rate 18, height 5\' 4"  (1.626 m), weight 113.4 kg, SpO2 99 %.  PHYSICAL EXAMINATION:  Physical Exam   GENERAL:  63 y.o.-year-old patient lying in the bed with no acute distress.  EYES: Pupils equal, round, reactive to light and accommodation. No scleral icterus. Extraocular muscles intact.  HEENT: Head atraumatic, normocephalic. Oropharynx and nasopharynx clear.  NECK:  Supple, no jugular venous distention. No thyroid enlargement, no tenderness.  LUNGS: Normal breath sounds bilaterally, no  rales,rhonchi or crepitation.  Occasional scattered wheezes present.  No use of  accessory muscles of respiration.  Decreased bibasilar breath sounds. CARDIOVASCULAR: S1, S2 normal. No murmurs, rubs, or gallops.  ABDOMEN: Soft, nontender, nondistended. Bowel sounds present. No organomegaly or mass.  EXTREMITIES: Significant swelling and tenderness around the left knee extending proximally.  No pedal edema, cyanosis, or clubbing.  NEUROLOGIC: Cranial nerves II through XII are intact. Muscle strength 5/5 in all extremities. Sensation intact. Gait not checked.  PSYCHIATRIC: The patient is alert and oriented x 3.  SKIN: No obvious rash, lesion, or ulcer.    LABORATORY PANEL:   CBC Recent Labs  Lab 05/13/18 0331  WBC 8.0  HGB 9.2*  HCT 30.4*  PLT 123*   ------------------------------------------------------------------------------------------------------------------  Chemistries  Recent Labs  Lab 05/12/18 0553 05/13/18 0331  NA 140 139  K 3.9 3.6  CL 105 106  CO2 27 25  GLUCOSE 209* 149*  BUN 19 17  CREATININE 1.21* 1.07*  CALCIUM 8.9 8.5*  AST 31  --   ALT 17  --   ALKPHOS 73  --   BILITOT 1.0  --    ------------------------------------------------------------------------------------------------------------------  Cardiac Enzymes No results for input(s): TROPONINI in the last 168 hours. ------------------------------------------------------------------------------------------------------------------  RADIOLOGY:  Dg Knee 1-2 Views Left  Result Date: 05/12/2018 CLINICAL DATA:  Status post ORIF of distal left femoral fracture EXAM: LEFT KNEE - 1-2 VIEW COMPARISON:  Film from earlier in the same day. FINDINGS: Knee replacement is again identified. New lateral fixation sideplate is noted along the distal femur. Fracture fragments have been reduced when compared with the prior exam. IMPRESSION: Status post ORIF of distal left femoral fracture Electronically Signed   By: Alcide Clever M.D.   On: 05/12/2018 16:11   Dg  Chest Port 1 View  Result Date:  05/12/2018 CLINICAL DATA:  Preoperative for repair of right femur fracture. EXAM: PORTABLE CHEST 1 VIEW COMPARISON:  01/06/2018 chest radiograph. FINDINGS: Stable cardiomediastinal silhouette with normal heart size. No pneumothorax. No pleural effusion. Lungs appear clear, with no acute consolidative airspace disease and no pulmonary edema. IMPRESSION: No active disease. Electronically Signed   By: Delbert Phenix M.D.   On: 05/12/2018 08:10   Dg C-arm 1-60 Min-no Report  Result Date: 05/12/2018 Fluoroscopy was utilized by the requesting physician.  No radiographic interpretation.   Dg Hip Unilat W Or Wo Pelvis 2-3 Views Left  Result Date: 05/12/2018 CLINICAL DATA:  Left hip pain after mechanical fall EXAM: DG HIP (WITH OR WITHOUT PELVIS) 2-3V LEFT COMPARISON:  None. FINDINGS: Pelvis radiograph is limited by overlapping EKG leads. There is no evidence of hip or pelvic ring fracture. No hip dislocation. IMPRESSION: Negative. Electronically Signed   By: Marnee Spring M.D.   On: 05/12/2018 06:30   Dg Femur Min 2 Views Left  Result Date: 05/12/2018 CLINICAL DATA:  Leg pain after fall EXAM: LEFT FEMUR 2 VIEWS COMPARISON:  None. FINDINGS: Primarily transverse distal femur fracture above a total knee arthroplasty. The fracture is displaced posteriorly by 50% with dorsal impaction. Osteopenia. IMPRESSION: Displaced distal femur fracture above a total knee arthroplasty. Electronically Signed   By: Marnee Spring M.D.   On: 05/12/2018 06:31    EKG:   Orders placed or performed in visit on 05/12/18   EKG 12-Lead    ASSESSMENT AND PLAN:   63 year old female with past medical history significant for obesity, combined CHF, COPD on 2 L chronic home O2, diabetes came in after a mechanical fall and left supracondylar femoral periprosthetic fracture.  1.  Left displaced supracondylar femoral fracture-periprosthetic due to prior history of left knee replacement. -Appreciate Ortho consult.  Status post open  reduction and internal fixation.  Postop day 1 today. -Appears to be in significant pain. -Pain medications have been adjusted.  Continue ice pack -Physical therapy  2.  COPD-stable.  Continue 2 L oxygen which is chronic. -Continue inhalers and nebulizers as needed  3.  Hypokalemia-being replaced  4.  Diabetes mellitus-on glipizide and sliding scale insulin  5.  Acute cystitis-on Rocephin.  Urine analysis to be ordered.  Stop after 3 days  6.  DVT prophylaxis-on Lovenox  Physical therapy consulted   All the records are reviewed and case discussed with Care Management/Social Workerr. Management plans discussed with the patient, family and they are in agreement.  CODE STATUS: Full code  TOTAL TIME TAKING CARE OF THIS PATIENT: 38 minutes.   POSSIBLE D/C IN 2-3 DAYS, DEPENDING ON CLINICAL CONDITION.   [name] M.D on 05/13/2018 at 12:25 PM  Between 7am to 6pm - Pager - [phone]  After 6pm go to [email or web address] - password [email or web address]  [facility]  Office  [phone]  CC: Primary care physician; Cheron Schaumann., MD

## 2018-05-14 ENCOUNTER — Encounter: Payer: Self-pay | Admitting: Orthopedic Surgery

## 2018-05-14 LAB — GLUCOSE, CAPILLARY
GLUCOSE-CAPILLARY: 151 mg/dL — AB (ref 70–99)
Glucose-Capillary: 114 mg/dL — ABNORMAL HIGH (ref 70–99)
Glucose-Capillary: 140 mg/dL — ABNORMAL HIGH (ref 70–99)
Glucose-Capillary: 315 mg/dL — ABNORMAL HIGH (ref 70–99)

## 2018-05-14 LAB — BASIC METABOLIC PANEL
Anion gap: 5 (ref 5–15)
BUN: 12 mg/dL (ref 8–23)
CALCIUM: 9 mg/dL (ref 8.9–10.3)
CO2: 27 mmol/L (ref 22–32)
Chloride: 104 mmol/L (ref 98–111)
Creatinine, Ser: 0.84 mg/dL (ref 0.44–1.00)
GFR calc Af Amer: >60 mL/min (ref >60)
GFR calc non Af Amer: >60 mL/min (ref >60)
Glucose, Bld: 126 mg/dL — ABNORMAL HIGH (ref 70–99)
Potassium: 4 mmol/L (ref 3.5–5.1)
Sodium: 136 mmol/L (ref 135–145)

## 2018-05-14 LAB — CBC
HCT: 30.2 % — ABNORMAL LOW (ref 36.0–46.0)
Hemoglobin: 9 g/dL — ABNORMAL LOW (ref 12.0–15.0)
MCH: 29.4 pg (ref 26.0–34.0)
MCHC: 29.8 g/dL — ABNORMAL LOW (ref 30.0–36.0)
MCV: 98.7 fL (ref 80.0–100.0)
Platelets: 122 10*3/uL — ABNORMAL LOW (ref 150–400)
RBC: 3.06 MIL/uL — ABNORMAL LOW (ref 3.87–5.11)
RDW: 16.4 % — ABNORMAL HIGH (ref 11.5–15.5)
WBC: 8.5 10*3/uL (ref 4.0–10.5)
nRBC: 0 % (ref 0.0–0.2)

## 2018-05-14 MED ORDER — INSULIN ASPART 100 UNIT/ML ~~LOC~~ SOLN
0.0000 [IU] | Freq: Three times a day (TID) | SUBCUTANEOUS | Status: DC
  Administered 2018-05-14: 7 [IU] via SUBCUTANEOUS
  Administered 2018-05-15: 1 [IU] via SUBCUTANEOUS
  Administered 2018-05-15 – 2018-05-16 (×2): 2 [IU] via SUBCUTANEOUS
  Filled 2018-05-14 (×4): qty 1

## 2018-05-14 NOTE — Progress Notes (Signed)
[facility] - [place] at [facility]   PATIENT NAME: Brenda Wu    MR#:  [id number]  DATE OF BIRTH:  05-31-1955  SUBJECTIVE:  CHIEF COMPLAINT:   Chief Complaint  Patient presents with  . Fall   -Status post left periprosthetic knee fracture repair.  Postop day 2.  -Still complains of significant pain around the knee -Acknowledges her anxiety episode during physical therapy yesterday.  REVIEW OF SYSTEMS:  Review of Systems  Constitutional: Negative for chills, fever and malaise/fatigue.  HENT: Negative for congestion, ear discharge, hearing loss and nosebleeds.   Eyes: Negative for blurred vision and double vision.  Respiratory: Negative for cough, shortness of breath and wheezing.   Cardiovascular: Negative for chest pain and palpitations.  Gastrointestinal: Negative for abdominal pain, constipation, diarrhea, nausea and vomiting.  Genitourinary: Negative for dysuria.  Musculoskeletal: Positive for joint pain and myalgias.  Neurological: Negative for dizziness, seizures and headaches.  Psychiatric/Behavioral: Negative for depression.    DRUG ALLERGIES:   Allergies  Allergen Reactions  . Metformin Other (See Comments)    Other reaction(s): Other (See Comments) Constipation, dry mouth, dizziness  . Bismuth Subsalicylate Rash    Pepto Bismol    VITALS:  Blood pressure 111/63, pulse 80, temperature 97.6 F (36.4 C), temperature source Oral, resp. rate 18, height 5\' 4"  (1.626 m), weight 116.6 kg, SpO2 100 %.  PHYSICAL EXAMINATION:  Physical Exam   GENERAL:  63 y.o.-year-old patient lying in the bed with no acute distress.  EYES: Pupils equal, round, reactive to light and accommodation. No scleral icterus. Extraocular muscles intact.  HEENT: Head atraumatic, normocephalic. Oropharynx and nasopharynx clear.  NECK:  Supple, no jugular venous distention. No thyroid enlargement, no tenderness.  LUNGS: Normal breath sounds bilaterally, no   rales,rhonchi or crepitation.  Occasional scattered wheezes present.  No use of accessory muscles of respiration.  Decreased bibasilar breath sounds. CARDIOVASCULAR: S1, S2 normal. No murmurs, rubs, or gallops.  ABDOMEN: Soft, nontender, nondistended. Bowel sounds present. No organomegaly or mass.  EXTREMITIES: Significant swelling and tenderness around the left knee extending proximally.  No pedal edema, cyanosis, or clubbing.  NEUROLOGIC: Cranial nerves II through XII are intact. Muscle strength 5/5 in all extremities. Sensation intact. Gait not checked.  PSYCHIATRIC: The patient is alert and oriented x 3.  SKIN: No obvious rash, lesion, or ulcer.    LABORATORY PANEL:   CBC Recent Labs  Lab 05/14/18 0508  WBC 8.5  HGB 9.0*  HCT 30.2*  PLT 122*   ------------------------------------------------------------------------------------------------------------------  Chemistries  Recent Labs  Lab 05/12/18 0553  05/14/18 0508  NA 140   < > 136  K 3.9   < > 4.0  CL 105   < > 104  CO2 27   < > 27  GLUCOSE 209*   < > 126*  BUN 19   < > 12  CREATININE 1.21*   < > 0.84  CALCIUM 8.9   < > 9.0  AST 31  --   --   ALT 17  --   --   ALKPHOS 73  --   --   BILITOT 1.0  --   --    < > = values in this interval not displayed.   ------------------------------------------------------------------------------------------------------------------  Cardiac Enzymes No results for input(s): TROPONINI in the last 168 hours. ------------------------------------------------------------------------------------------------------------------  RADIOLOGY:  Dg Knee 1-2 Views Left  Result Date: 05/12/2018 CLINICAL DATA:  Status post ORIF of distal left femoral fracture EXAM: LEFT KNEE -  1-2 VIEW COMPARISON:  Film from earlier in the same day. FINDINGS: Knee replacement is again identified. New lateral fixation sideplate is noted along the distal femur. Fracture fragments have been reduced when compared  with the prior exam. IMPRESSION: Status post ORIF of distal left femoral fracture Electronically Signed   By: Alcide Clever M.D.   On: 05/12/2018 16:11   Dg C-arm 1-60 Min-no Report  Result Date: 05/12/2018 Fluoroscopy was utilized by the requesting physician.  No radiographic interpretation.    EKG:   Orders placed or performed in visit on 05/12/18  . EKG 12-Lead    ASSESSMENT AND PLAN:   63 year old female with past medical history significant for obesity, combined CHF, COPD on 2 L chronic home O2, diabetes came in after a mechanical fall and left supracondylar femoral periprosthetic fracture.  1.  Left displaced supracondylar femoral fracture-periprosthetic due to prior history of left knee replacement. -Appreciate Ortho consult.  Status post open reduction and internal fixation.  Postop day 2 today. -Still appears to be in significant pain. -Pain medications have been adjusted.  Continue ice pack -Physical therapy today  2.  COPD-stable.  Continue 2 L oxygen which is chronic. -Continue inhalers and nebulizers as needed  3.  Hypokalemia- replaced  4.  Diabetes mellitus-on glipizide and sliding scale insulin  5.  Acute cystitis-on Rocephin.  Urine analysis to be ordered.  Stop after 3 days  6.  DVT prophylaxis-on Lovenox  Physical therapy consulted-refused rehab.  Agreed to work with therapy.  Possible discharge home when ready with home health   All the records are reviewed and case discussed with Care Management/Social Workerr. Management plans discussed with the patient, family and they are in agreement.  CODE STATUS: Full code  TOTAL TIME TAKING CARE OF THIS PATIENT: 38 minutes.   POSSIBLE D/C IN 2-3 DAYS, DEPENDING ON CLINICAL CONDITION.   [name] M.D on 05/14/2018 at 8:43 AM  Between 7am to 6pm - Pager - [phone]  After 6pm go to [email or web address] - password [email or web address]  [facility]  Office  [phone]  CC: Primary care  physician; Cheron Schaumann., MD

## 2018-05-14 NOTE — Evaluation (Signed)
Physical Therapy Evaluation Patient Details Name: Brenda Wu MRN: [id number] DOB: 06/16/1955 Today's Date: 05/14/2018   History of Present Illness  Brenda Wu  is a 63 y.o. female with a known history of combined systolic and diastolic heart failure,  HTN, COPD, NICM, NSTEMI, seizures, and stroke, diabetes mellitus type 2 and a mechanical fall at home resulting in severe left knee pain, found to have displaced supracondylar fracture of distal femur wtih intact hardware. Pt is s/p R ORIF distal femur fx of periprosthetic  Clinical Impression  Patient is a pleasant 63 year old female who presents with generalized weakness, limited mobility, and high pain secondary to fall resulting in fracture and ORIF. Patient is limited by fear of pain and requires additional time for all mobility. Able to transfer to EOB from supine with Min A to LLE. Patient not willing to weight bear upon LLE despite PT educating patient on Touch Down WB status. Patient able to hop 3 feet forward and 3 feet backwards prior to pain limiting further movement with use of RW and CGA. Patient would benefit from skilled physical therapy to improve LE strength, LLE movement, weight bearing and mobility for decreased fall risk. Patient would benefit from placement in SNF upon d/c from hospital due to limited mobility. Patient does not want skilled nursing stating she is afraid to go there because of COVID 19. Patient educated on need for assistance due to limited mobility and why SNF is the safest option however she was adamant she did not want SNF, agreeable to HHPT with an aide to come and assist with ADLs.     Follow Up Recommendations SNF(Patient refusing SNF due to COVID 19. IF refuses, HHPT )    Equipment Recommendations  3in1 (PT)    Recommendations for Other Services       Precautions / Restrictions Precautions Precautions: Fall Precaution Comments: requires oxygen Restrictions Weight Bearing Restrictions:  Yes LLE Weight Bearing: Touchdown weight bearing      Mobility  Bed Mobility Overal bed mobility: Needs Assistance Bed Mobility: Supine to Sit     Supine to sit: Min guard     General bed mobility comments: extra increase in time, patient hooked RLE under LLE to move it to EOB, slow to sit EOB, supervision, requiring slight assist for placement of heel on ground due to fear of pain  Transfers Overall transfer level: Needs assistance Equipment used: Rolling walker (2 wheeled) Transfers: Sit to/from Stand Sit to Stand: Min assist         General transfer comment: Patient performed STS with verbal cueing for hand placement, Able to perform without putting weight on LLE due to fear of pain. Required min A at final point to bring opp UE to RW.   Ambulation/Gait Ambulation/Gait assistance: Min assist;Min guard Gait Distance (Feet): 3 Feet Assistive device: Standard walker       General Gait Details: Patient hopped forward 3 steps and then backwards three steps, refused to WB on LLE despite orders for TWB at this time.   Stairs            Wheelchair Mobility    Modified Rankin (Stroke Patients Only)       Balance Overall balance assessment: Needs assistance Sitting-balance support: Bilateral upper extremity supported Sitting balance-Leahy Scale: Fair Sitting balance - Comments: needing BUE support to guard from pain   Standing balance support: Bilateral upper extremity supported;During functional activity Standing balance-Leahy Scale: Poor Standing balance comment: heavy reliance on BUE support, does  not weight bear onto LLE                             Pertinent Vitals/Pain Pain Assessment: 0-10 Pain Score: 10-Worst pain ever Pain Descriptors / Indicators: Aching;Throbbing;Shooting Pain Intervention(s): Limited activity within patient's tolerance;Monitored during session    Home Living Family/patient expects to be discharged to:: Private  residence Living Arrangements: Alone Available Help at Discharge: Friend(s) Type of Home: House Home Access: Level entry     Home Layout: One level Home Equipment: Environmental consultant - 2 wheels;Tub bench;Shower seat Additional Comments: Patient utilizes 2 L 02 via nasal cannula at home    Prior Function Level of Independence: Independent with assistive device(s)         Comments: Pt uses 2LO2 at baseline, reports independence completing ALDs and driving.     Hand Dominance        Extremity/Trunk Assessment   Upper Extremity Assessment Upper Extremity Assessment: Defer to OT evaluation    Lower Extremity Assessment Lower Extremity Assessment: LLE deficits/detail;RLE deficits/detail RLE Deficits / Details: 4/5 gross strength RLE Sensation: WNL LLE Deficits / Details: unable to manual muscle test due to limited range of motion and high pain LLE: Unable to fully assess due to pain LLE Sensation: decreased light touch LLE Coordination: decreased gross motor       Communication   Communication: No difficulties  Cognition Arousal/Alertness: Awake/alert Behavior During Therapy: WFL for tasks assessed/performed Overall Cognitive Status: Within Functional Limits for tasks assessed                                        General Comments General comments (skin integrity, edema, etc.): slight swelling on LLE with slight discoloration.     Exercises General Exercises - Lower Extremity Ankle Circles/Pumps: AROM;Strengthening;Both;20 reps;Supine Other Exercises Other Exercises: sitting EOB x 6 minutes focusing on weight shift onto L hip/pelvis. monitor Sp02 throughout session >90%.  Other Exercises: education on TWB: patient verbalizes understanding however states she does not want to put any weight on it at all.    Assessment/Plan    PT Assessment Patient needs continued PT services  PT Problem List Decreased strength;Decreased range of motion;Decreased activity  tolerance;Decreased coordination;Decreased mobility;Decreased balance;Pain       PT Treatment Interventions DME instruction;Gait training;Functional mobility training;Stair training;Therapeutic activities;Therapeutic exercise;Neuromuscular re-education;Balance training;Patient/family education    PT Goals (Current goals can be found in the Care Plan section)  Acute Rehab PT Goals Patient Stated Goal: return to ind.  PT Goal Formulation: With patient Time For Goal Achievement: 05/28/18 Potential to Achieve Goals: Fair    Frequency BID   Barriers to discharge Decreased caregiver support patient lives alone, has friends in neighboring houses but does not have anyone in the house.     Co-evaluation               AM-PAC PT "6 Clicks" Mobility  Outcome Measure Help needed turning from your back to your side while in a flat bed without using bedrails?: A Little Help needed moving from lying on your back to sitting on the side of a flat bed without using bedrails?: A Lot Help needed moving to and from a bed to a chair (including a wheelchair)?: A Little Help needed standing up from a chair using your arms (e.g., wheelchair or bedside chair)?: A Little Help needed to  walk in hospital room?: A Lot Help needed climbing 3-5 steps with a railing? : Total 6 Click Score: 14    End of Session Equipment Utilized During Treatment: Gait belt;Oxygen(2 L 02 via nasal cannula) Activity Tolerance: Patient limited by pain Patient left: in bed;with call bell/phone within reach;with bed alarm set;with family/visitor present Nurse Communication: Mobility status PT Visit Diagnosis: Unsteadiness on feet (R26.81);Other abnormalities of gait and mobility (R26.89);Muscle weakness (generalized) (M62.81);Pain Pain - Right/Left: Left Pain - part of body: Leg    Time: 9323-5573 PT Time Calculation (min) (ACUTE ONLY): 33 min   Charges:   PT Evaluation $PT Eval Low Complexity: 1 Low PT  Treatments $Therapeutic Exercise: 8-22 mins        Precious Bard, PT, DPT    Precious Bard 05/14/2018, 12:55 PM

## 2018-05-14 NOTE — Progress Notes (Signed)
Pt eating Candy blood sugar is 315 no night time sliding scale. Dr. Anne Hahn notified.

## 2018-05-14 NOTE — Progress Notes (Signed)
Subjective: 2 Days Post-Op Procedure(s) (LRB): OPEN REDUCTION INTERNAL FIXATION (ORIF) DISTAL FEMUR FRACTURE (Left) Patient reports pain as moderate, however improved compared to yesterday. Reports she was able to get some rest last night. Patient is well, and has had no acute complaints or problems Patient is currently refusing SNF, will plan for discharge home with HHPT at this time. Negative for chest pain and shortness of breath Fever: no Gastrointestinal:Negative for nausea and vomiting  Objective: Vital signs in last 24 hours: Temp:  [97.6 F (36.4 C)-98 F (36.7 C)] 97.6 F (36.4 C) (03/22 0742) Pulse Rate:  [76-91] 80 (03/22 0742) Resp:  [17-18] 18 (03/22 0742) BP: (93-119)/(56-65) 111/63 (03/22 0742) SpO2:  [99 %-100 %] 100 % (03/22 0742) Weight:  [116.6 kg] 116.6 kg (03/22 0503)  Intake/Output from previous day:  Intake/Output Summary (Last 24 hours) at 05/14/2018 0940 Last data filed at 05/14/2018 0502 Gross per 24 hour  Intake -  Output 950 ml  Net -950 ml    Intake/Output this shift: No intake/output data recorded.  Labs: Recent Labs    05/12/18 0553 05/13/18 0331 05/14/18 0508  HGB 11.1* 9.2* 9.0*   Recent Labs    05/13/18 0331 05/14/18 0508  WBC 8.0 8.5  RBC 3.08* 3.06*  HCT 30.4* 30.2*  PLT 123* 122*   Recent Labs    05/13/18 0331 05/14/18 0508  NA 139 136  K 3.6 4.0  CL 106 104  CO2 25 27  BUN 17 12  CREATININE 1.07* 0.84  GLUCOSE 149* 126*  CALCIUM 8.5* 9.0   Recent Labs    05/12/18 0553  INR 1.0     EXAM General - Patient is Alert, Appropriate and Oriented Extremity - ABD soft Sensation intact distally Intact pulses distally Dorsiflexion/Plantar flexion intact Incision: dressing C/D/I No cellulitis present Dressing/Incision - No drainage to the honeycomb dressings this AM. Motor Function - intact, moving foot and toes well on exam.  Abdomen soft with normal BS.  Past Medical History:  Diagnosis Date  . Chronic  combined systolic (congestive) and diastolic (congestive) heart failure (HCC)    a. 10/2017 Echo: EF 45-50%, diff HK. Mild to mod MR, mildly dil LA. Mod dil RV w/ mildly reduced RV fxn.  [name] COPD (chronic obstructive pulmonary disease) (HCC)   . Depression   . Diabetes mellitus without complication (HCC)   . Hypercholesteremia   . Hypertension   . Morbid obesity (HCC)   . NICM (nonischemic cardiomyopathy) (HCC)   . NSTEMI (non-ST elevated myocardial infarction) (HCC)    a. 12/2017 -->Cath: nl cors. EF 45-50%.  . Seizures (HCC)   . Stroke (HCC)   . Tricuspid regurgitation    a. 10/2017 Echo: Mild to mod TR.    Assessment/Plan: 2 Days Post-Op Procedure(s) (LRB): OPEN REDUCTION INTERNAL FIXATION (ORIF) DISTAL FEMUR FRACTURE (Left) Active Problems:   Fracture of prosthetic knee (HCC)  Estimated body mass index is 44.11 kg/m as calculated from the following:   Height as of this encounter: 5\' 4"  (1.626 m).   Weight as of this encounter: 116.6 kg. Advance diet Up with therapy   Labs reviewed this AM, Hg 9.0, continue to monitor.  CBC and BMP ordered for tomorrow morning. Pain is better controlled this morning. Pt is passing gas without pain.  Continue to work on BM. Up with therapy today.  DVT Prophylaxis - Lovenox, Foot Pumps and TED hose Toe-touch weightbearing to the left lower extremity.  Valeria Batman, PA-C [facility] 05/14/2018,  9:40 AM

## 2018-05-15 LAB — BASIC METABOLIC PANEL
Anion gap: 6 (ref 5–15)
BUN: 14 mg/dL (ref 8–23)
CALCIUM: 8.6 mg/dL — AB (ref 8.9–10.3)
CO2: 27 mmol/L (ref 22–32)
CREATININE: 0.84 mg/dL (ref 0.44–1.00)
Chloride: 104 mmol/L (ref 98–111)
GFR calc Af Amer: >60 mL/min (ref >60)
GFR calc non Af Amer: >60 mL/min (ref >60)
Glucose, Bld: 126 mg/dL — ABNORMAL HIGH (ref 70–99)
Potassium: 4.3 mmol/L (ref 3.5–5.1)
Sodium: 137 mmol/L (ref 135–145)

## 2018-05-15 LAB — CBC
HCT: 25.8 % — ABNORMAL LOW (ref 36.0–46.0)
Hemoglobin: 7.8 g/dL — ABNORMAL LOW (ref 12.0–15.0)
MCH: 29.3 pg (ref 26.0–34.0)
MCHC: 30.2 g/dL (ref 30.0–36.0)
MCV: 97 fL (ref 80.0–100.0)
Platelets: 127 10*3/uL — ABNORMAL LOW (ref 150–400)
RBC: 2.66 MIL/uL — ABNORMAL LOW (ref 3.87–5.11)
RDW: 16.3 % — ABNORMAL HIGH (ref 11.5–15.5)
WBC: 7.3 10*3/uL (ref 4.0–10.5)
nRBC: 0.5 % — ABNORMAL HIGH (ref 0.0–0.2)

## 2018-05-15 LAB — GLUCOSE, CAPILLARY
GLUCOSE-CAPILLARY: 136 mg/dL — AB (ref 70–99)
Glucose-Capillary: 285 mg/dL — ABNORMAL HIGH (ref 70–99)
Glucose-Capillary: 127 mg/dL — ABNORMAL HIGH (ref 70–99)
Glucose-Capillary: 157 mg/dL — ABNORMAL HIGH (ref 70–99)
Glucose-Capillary: 118 mg/dL — ABNORMAL HIGH (ref 70–99)

## 2018-05-15 MED ORDER — POLYETHYLENE GLYCOL 3350 17 G PO PACK
17.0000 g | PACK | Freq: Every day | ORAL | Status: DC
  Administered 2018-05-15: 17 g via ORAL
  Filled 2018-05-15: qty 1

## 2018-05-15 MED ORDER — HYDROCODONE-ACETAMINOPHEN 5-325 MG PO TABS: 1.0000 | ORAL_TABLET | ORAL | 0 refills | Status: DC | PRN

## 2018-05-15 MED ORDER — ENOXAPARIN SODIUM 40 MG/0.4ML ~~LOC~~ SOLN: 40.0000 mg | SUBCUTANEOUS | 0 refills | Status: DC

## 2018-05-15 MED ORDER — FE FUMARATE-B12-VIT C-FA-IFC PO CAPS
1.0000 | ORAL_CAPSULE | Freq: Three times a day (TID) | ORAL | Status: DC
  Administered 2018-05-15 – 2018-05-16 (×4): 1 via ORAL
  Filled 2018-05-15 (×5): qty 1

## 2018-05-15 MED ORDER — FERROUS SULFATE 325 (65 FE) MG PO TABS: 325.0000 mg | ORAL_TABLET | Freq: Two times a day (BID) | ORAL | Status: DC

## 2018-05-15 MED ORDER — ATORVASTATIN CALCIUM 20 MG PO TABS
40.0000 mg | ORAL_TABLET | Freq: Every day | ORAL | Status: DC
  Administered 2018-05-15: 40 mg via ORAL
  Filled 2018-05-15: qty 2

## 2018-05-15 NOTE — Progress Notes (Addendum)
[facility] - [place] at [facility]   PATIENT NAME: Brenda Wu    MR#:  [id number]  DATE OF BIRTH:  06-04-1955  SUBJECTIVE:  CHIEF COMPLAINT:   Chief Complaint  Patient presents with   Fall   -Status post left periprosthetic knee fracture repair.  Postop day 3.  -Still complains of significant pain around the knee -Otherwise doing well.  Plan is to discharge home after hospitalization  REVIEW OF SYSTEMS:  Review of Systems  Constitutional: Negative for chills, fever and malaise/fatigue.  HENT: Negative for congestion, ear discharge, hearing loss and nosebleeds.   Eyes: Negative for blurred vision and double vision.  Respiratory: Negative for cough, shortness of breath and wheezing.   Cardiovascular: Negative for chest pain and palpitations.  Gastrointestinal: Negative for abdominal pain, constipation, diarrhea, nausea and vomiting.  Genitourinary: Negative for dysuria.  Musculoskeletal: Positive for joint pain and myalgias.  Neurological: Negative for dizziness, seizures and headaches.  Psychiatric/Behavioral: Negative for depression.    DRUG ALLERGIES:   Allergies  Allergen Reactions   Metformin Other (See Comments)    Other reaction(s): Other (See Comments) Constipation, dry mouth, dizziness   Bismuth Subsalicylate Rash    Pepto Bismol    VITALS:  Blood pressure (!) 109/59, pulse 89, temperature 98.7 F (37.1 C), temperature source Oral, resp. rate 17, height 5\' 4"  (1.626 m), weight 116.6 kg, SpO2 98 %.  PHYSICAL EXAMINATION:  Physical Exam   GENERAL:  63 y.o.-year-old patient lying in the bed with no acute distress.  EYES: Pupils equal, round, reactive to light and accommodation. No scleral icterus. Extraocular muscles intact.  HEENT: Head atraumatic, normocephalic. Oropharynx and nasopharynx clear.  NECK:  Supple, no jugular venous distention. No thyroid enlargement, no tenderness.  LUNGS: Normal breath sounds bilaterally, no   rales,rhonchi or crepitation.  Occasional scattered wheezes present.  No use of accessory muscles of respiration.  Decreased bibasilar breath sounds. CARDIOVASCULAR: S1, S2 normal. No murmurs, rubs, or gallops.  ABDOMEN: Soft, nontender, nondistended. Bowel sounds present. No organomegaly or mass.  EXTREMITIES: Significant swelling and tenderness around the left knee extending proximally.  No pedal edema, cyanosis, or clubbing.  Staples in place NEUROLOGIC: Cranial nerves II through XII are intact. Muscle strength 5/5 in all extremities. Sensation intact. Gait not checked.  PSYCHIATRIC: The patient is alert and oriented x 3.  SKIN: No obvious rash, lesion, or ulcer.    LABORATORY PANEL:   CBC Recent Labs  Lab 05/15/18 0333  WBC 7.3  HGB 7.8*  HCT 25.8*  PLT 127*   ------------------------------------------------------------------------------------------------------------------  Chemistries  Recent Labs  Lab 05/12/18 0553  05/15/18 0333  NA 140   < > 137  K 3.9   < > 4.3  CL 105   < > 104  CO2 27   < > 27  GLUCOSE 209*   < > 126*  BUN 19   < > 14  CREATININE 1.21*   < > 0.84  CALCIUM 8.9   < > 8.6*  AST 31  --   --   ALT 17  --   --   ALKPHOS 73  --   --   BILITOT 1.0  --   --    < > = values in this interval not displayed.   ------------------------------------------------------------------------------------------------------------------  Cardiac Enzymes No results for input(s): TROPONINI in the last 168 hours. ------------------------------------------------------------------------------------------------------------------  RADIOLOGY:  No results found.  EKG:   Orders placed or performed in visit on 05/12/18  EKG 12-Lead    ASSESSMENT AND PLAN:   63 year old female with past medical history significant for obesity, combined CHF, COPD on 2 L chronic home O2, diabetes came in after a mechanical fall and left supracondylar femoral periprosthetic  fracture.  1.  Left displaced supracondylar femoral fracture-periprosthetic due to prior history of left knee replacement. -Appreciate Ortho consult.  Status post open reduction and internal fixation.  Postop day 3 today. -Still appears to be in significant pain. -Pain medications have been adjusted.  Continue ice pack -Physical therapy recommended rehab but patient wants to go home.  Once pain is improved and patient able to do basic ADLs with a walker, will be discharged home with home health.  2.  COPD-stable.  Continue 2 L oxygen which is chronic. -Continue inhalers and nebulizers as needed  3.  Hypokalemia- replaced  4.  Diabetes mellitus-on glipizide and sliding scale insulin  5.  Acute cystitis-on Rocephin.  Urine analysis to be ordered.  Stop after 3 days  6.  DVT prophylaxis-on Lovenox  7.  Acute on chronic postop anemia-could be blood loss and also dilution from fluids. -No active bleeding.  If continues to drop, consider CT of the knee given the swelling and the tenderness to rule out hematoma. -No indication for transfusion as long as hemoglobin greater than 7.  Started on iron supplements.  Follow-up again in a.m.  Physical therapy consulted-refused rehab. Possible discharge home when ready with home health   All the records are reviewed and case discussed with Care Management/Social Workerr. Management plans discussed with the patient, family and they are in agreement.  CODE STATUS: Full code  TOTAL TIME TAKING CARE OF THIS PATIENT: 36 minutes.   POSSIBLE D/C IN 1-2 DAYS, DEPENDING ON CLINICAL CONDITION.   [name] M.D on 05/15/2018 at 9:25 AM  Between 7am to 6pm - Pager - [phone]  After 6pm go to [email or web address] - password [email or web address]  [facility]  Office  [phone]  CC: Primary care physician; Cheron Schaumann., MD

## 2018-05-15 NOTE — Progress Notes (Signed)
Physical Therapy Treatment Patient Details Name: Brenda Wu MRN: [id number] DOB: 1956-01-31 Today's Date: 05/15/2018    History of Present Illness Brenda Wu  is a 63 y.o. female with a known history of combined systolic and diastolic heart failure,  HTN, COPD, NICM, NSTEMI, seizures, and stroke, diabetes mellitus type 2 and a mechanical fall at home resulting in severe left knee pain, found to have displaced supracondylar fracture of distal femur wtih intact hardware. Pt is s/p R ORIF distal femur fx of periprosthetic    PT Comments    Pt agreeable to PT; notes considerable L knee pain with movement, but agreeable to attempt ambulation. STS from multiple surfaces (recliner/BSC) with increased time and cues for LLE placement to tolerate weight bearing and manage pain. Pt demonstrates improved ability to clear RLE with attempting steps versus pivoting although RLE step is heavy landing. Able to ambulate 3 ft at a time, performed 4 times and is effortful. Pt fatigued post session. Pt continues in chair and encouraged to perform exercises later in the afternoon post rest time. Continue PT to progress strength, endurance, stand tolerance to improve all functional mobility.   Follow Up Recommendations  SNF     Equipment Recommendations       Recommendations for Other Services       Precautions / Restrictions Precautions Precautions: Fall Restrictions Weight Bearing Restrictions: Yes LLE Weight Bearing: Touchdown weight bearing    Mobility  Bed Mobility Overal bed mobility: Needs Assistance Bed Mobility: Supine to Sit     Supine to sit: Min assist     General bed mobility comments: Assist for LLE  Transfers Overall transfer level: Needs assistance Equipment used: Rolling walker (2 wheeled) Transfers: Sit to/from Stand Sit to Stand: Min assist Stand pivot transfers: Min assist;Min guard       General transfer comment: STS performed 3x. Cues for LLE placement to  control pain. Good use of UEs for support. Maintains PWB status  Ambulation/Gait Ambulation/Gait assistance: Min guard;Min assist Gait Distance (Feet): 3 Feet(performed 4x) Assistive device: Rolling walker (2 wheeled) Gait Pattern/deviations: Step-to pattern;Antalgic(PWB L) Gait velocity: slow/effortful   General Gait Details: Improved ability to take steps with heavy reliance on rw and less than partial weight bear on LLE with heavy landing/step on RLE. Fwd/bkwd 3 ft ea and to/from [facility] 3 ft [employer]    Modified Rankin (Stroke Patients Only)       Balance Overall balance assessment: Needs assistance Sitting-balance support: Bilateral upper extremity supported Sitting balance-Leahy Scale: Good     Standing balance support: Bilateral upper extremity supported Standing balance-Leahy Scale: Fair                              Cognition Arousal/Alertness: Awake/alert Behavior During Therapy: WFL for tasks assessed/performed Overall Cognitive Status: Within Functional Limits for tasks assessed                                        Exercises General Exercises - Lower Extremity Ankle Circles/Pumps: AROM;Both;20 reps Quad Sets: Strengthening;Both;10 reps Gluteal Sets: Strengthening;Both;10 reps Short Arc QuadBarbaraann Boys;Left;10 reps;Seated Heel Slides: AAROM;Left;10 reps Hip ABduction/ADduction: AAROM;Left;20 reps Hip Flexion/Marching: AAROM;Left;10 reps Other Exercises Other Exercises: Min guard stand personal hygiene    General Comments  Pertinent Vitals/Pain Pain Assessment: Faces("IT HURTS") Pain Score: (10-20 depending on activity) Faces Pain Scale: Hurts whole lot Pain Location: LLE Pain Descriptors / Indicators: Constant;Grimacing;Operative site guarding(it hurts) Pain Intervention(s): Limited activity within patient's tolerance    Home Living                      Prior  Function            PT Goals (current goals can now be found in the care plan section) Progress towards PT goals: Progressing toward goals(slowly)    Frequency    BID      PT Plan Current plan remains appropriate    Co-evaluation              AM-PAC PT "6 Clicks" Mobility   Outcome Measure  Help needed turning from your back to your side while in a flat bed without using bedrails?: A Little Help needed moving from lying on your back to sitting on the side of a flat bed without using bedrails?: A Lot Help needed moving to and from a bed to a chair (including a wheelchair)?: A Little Help needed standing up from a chair using your arms (e.g., wheelchair or bedside chair)?: A Little Help needed to walk in hospital room?: A Lot Help needed climbing 3-5 steps with a railing? : Total 6 Click Score: 14    End of Session Equipment Utilized During Treatment: Gait belt;Oxygen Activity Tolerance: Patient limited by pain;Patient tolerated treatment well Patient left: in chair;with call bell/phone within reach;with chair alarm set   PT Visit Diagnosis: Unsteadiness on feet (R26.81);Other abnormalities of gait and mobility (R26.89);Muscle weakness (generalized) (M62.81);Pain Pain - Right/Left: Left Pain - part of body: Leg     Time: 1337-1401 PT Time Calculation (min) (ACUTE ONLY): 24 min  Charges:  $Gait Training: 8-22 mins $Therapeutic Exercise: 23-37 mins $Therapeutic Activity: 8-22 mins                      Scot Dock, PTA 05/15/2018, 2:11 PM

## 2018-05-15 NOTE — Progress Notes (Signed)
Spoke with Dr. Sheryle Hail pt with hemoglobin of 7.8. MD acknowledged.

## 2018-05-15 NOTE — Care Management Important Message (Signed)
Important Message  Patient Details  Name: Brenda Wu MRN: [id number] Date of Birth: 1955-10-16   Medicare Important Message Given:  Yes    Olegario Messier A Thia Olesen 05/15/2018, 11:08 AM

## 2018-05-15 NOTE — Progress Notes (Signed)
Physical Therapy Treatment Patient Details Name: Brenda Wu MRN: [id number] DOB: Sep 24, 1955 Today's Date: 05/15/2018    History of Present Illness Brenda Wu  is a 63 y.o. female with a known history of combined systolic and diastolic heart failure,  HTN, COPD, NICM, NSTEMI, seizures, and stroke, diabetes mellitus type 2 and a mechanical fall at home resulting in severe left knee pain, found to have displaced supracondylar fracture of distal femur wtih intact hardware. Pt is s/p R ORIF distal femur fx of periprosthetic    PT Comments    Pt agreeable to PT; reports 10-20 pain on a 10 point scale depending on activity. Pt requires increased time/slow movement throughout due to pain/guarding of LLE and overall fear of moving LLE. Pt educated on BLE exercises with written handout provided and education for self assist for LLE. Pt demonstrates adherence to PWB on LLE due to pain. STS and stand pivot transfers this session; unable to demonstrate hops for ambulation bed to chair. Continue PT to progress range, strength and tolerance of activity to improve all functional mobility.    Follow Up Recommendations  SNF     Equipment Recommendations       Recommendations for Other Services       Precautions / Restrictions Precautions Precautions: Fall Restrictions Weight Bearing Restrictions: Yes LLE Weight Bearing: Touchdown weight bearing    Mobility  Bed Mobility Overal bed mobility: Needs Assistance Bed Mobility: Supine to Sit     Supine to sit: Min assist     General bed mobility comments: Assist for LLE  Transfers Overall transfer level: Needs assistance Equipment used: Rolling walker (2 wheeled) Transfers: Sit to/from [employer] Sit to Stand: Min assist;From elevated surface Stand pivot transfers: Min assist;Min guard       General transfer comment: maintains weight bearing status (PWB) due to pain  Ambulation/Gait             General  Gait Details: unable   Stairs             Wheelchair Mobility    Modified Rankin (Stroke Patients Only)       Balance Overall balance assessment: Needs assistance Sitting-balance support: Bilateral upper extremity supported Sitting balance-Leahy Scale: Good     Standing balance support: Bilateral upper extremity supported Standing balance-Leahy Scale: Fair                              Cognition Arousal/Alertness: Awake/alert Behavior During Therapy: WFL for tasks assessed/performed Overall Cognitive Status: Within Functional Limits for tasks assessed                                        Exercises General Exercises - Lower Extremity Ankle Circles/Pumps: AROM;Both;20 reps Quad Sets: Strengthening;Both;10 reps Gluteal Sets: Strengthening;Both;10 reps Short Arc QuadBarbaraann Boys;Left;10 reps;Seated Heel Slides: AAROM;Left;10 reps Hip ABduction/ADduction: AAROM;Left;20 reps Hip Flexion/Marching: AAROM;Left;10 reps Other Exercises Other Exercises: Pt given written handout for exercises with education/encouragement to perform several times per day.     General Comments        Pertinent Vitals/Pain Pain Assessment: 0-10 Pain Score: (10-20 depending on activity) Pain Location: LLE Pain Descriptors / Indicators: Constant;Grimacing;Operative site guarding(it hurts) Pain Intervention(s): Limited activity within patient's tolerance    Home Living  Prior Function            PT Goals (current goals can now be found in the care plan section) Progress towards PT goals: Progressing toward goals(slowly)    Frequency    BID      PT Plan Current plan remains appropriate    Co-evaluation              AM-PAC PT "6 Clicks" Mobility   Outcome Measure  Help needed turning from your back to your side while in a flat bed without using bedrails?: A Little Help needed moving from lying on your back to  sitting on the side of a flat bed without using bedrails?: A Lot Help needed moving to and from a bed to a chair (including a wheelchair)?: A Little Help needed standing up from a chair using your arms (e.g., wheelchair or bedside chair)?: A Little Help needed to walk in hospital room?: A Lot Help needed climbing 3-5 steps with a railing? : Total 6 Click Score: 14    End of Session Equipment Utilized During Treatment: Gait belt;Oxygen Activity Tolerance: Patient limited by pain Patient left: in chair;with call bell/phone within reach;with chair alarm set   PT Visit Diagnosis: Unsteadiness on feet (R26.81);Other abnormalities of gait and mobility (R26.89);Muscle weakness (generalized) (M62.81);Pain Pain - Right/Left: Left Pain - part of body: Leg     Time: 1052-1130 PT Time Calculation (min) (ACUTE ONLY): 38 min  Charges:  $Therapeutic Exercise: 23-37 mins $Therapeutic Activity: 8-22 mins                      Scot Dock, PTA 05/15/2018, 12:45 PM

## 2018-05-15 NOTE — Progress Notes (Signed)
  Subjective: 3 Days Post-Op Procedure(s) (LRB): OPEN REDUCTION INTERNAL FIXATION (ORIF) DISTAL FEMUR FRACTURE (Left) Patient reports pain as moderate. Patient is well, and has had no acute complaints or problems Patient is currently refusing SNF, will plan for discharge home with HHPT at this time. Negative for chest pain and shortness of breath Fever: no Gastrointestinal:Negative for nausea and vomiting  Objective: Vital signs in last 24 hours: Temp:  [98.4 F (36.9 C)-99.1 F (37.3 C)] 98.7 F (37.1 C) (03/23 0730) Pulse Rate:  [89-97] 89 (03/23 0730) Resp:  [17-18] 17 (03/23 0730) BP: (106-109)/(59-63) 109/59 (03/23 0730) SpO2:  [98 %-100 %] 98 % (03/23 0730)  Intake/Output from previous day:  Intake/Output Summary (Last 24 hours) at 05/15/2018 0807 Last data filed at 05/14/2018 2331 Gross per 24 hour  Intake 1775.63 ml  Output 1500 ml  Net 275.63 ml    Intake/Output this shift: No intake/output data recorded.  Labs: Recent Labs    05/13/18 0331 05/14/18 0508 05/15/18 0333  HGB 9.2* 9.0* 7.8*   Recent Labs    05/14/18 0508 05/15/18 0333  WBC 8.5 7.3  RBC 3.06* 2.66*  HCT 30.2* 25.8*  PLT 122* 127*   Recent Labs    05/14/18 0508 05/15/18 0333  NA 136 137  K 4.0 4.3  CL 104 104  CO2 27 27  BUN 12 14  CREATININE 0.84 0.84  GLUCOSE 126* 126*  CALCIUM 9.0 8.6*   No results for input(s): LABPT, INR in the last 72 hours.   EXAM General - Patient is Alert, Appropriate and Oriented Extremity - ABD soft Sensation intact distally Intact pulses distally Dorsiflexion/Plantar flexion intact Incision: dressing C/D/I No cellulitis present Dressing/Incision - No drainage to the honeycomb dressings this AM. Motor Function - intact, moving foot and toes well on exam.  Abdomen soft with normal BS.  Past Medical History:  Diagnosis Date  . Chronic combined systolic (congestive) and diastolic (congestive) heart failure (HCC)    a. 10/2017 Echo: EF 45-50%,  diff HK. Mild to mod MR, mildly dil LA. Mod dil RV w/ mildly reduced RV fxn.  [name] COPD (chronic obstructive pulmonary disease) (HCC)   . Depression   . Diabetes mellitus without complication (HCC)   . Hypercholesteremia   . Hypertension   . Morbid obesity (HCC)   . NICM (nonischemic cardiomyopathy) (HCC)   . NSTEMI (non-ST elevated myocardial infarction) (HCC)    a. 12/2017 -->Cath: nl cors. EF 45-50%.  . Seizures (HCC)   . Stroke (HCC)   . Tricuspid regurgitation    a. 10/2017 Echo: Mild to mod TR.    Assessment/Plan: 3 Days Post-Op Procedure(s) (LRB): OPEN REDUCTION INTERNAL FIXATION (ORIF) DISTAL FEMUR FRACTURE (Left) Active Problems:   Fracture of prosthetic knee (HCC)  Estimated body mass index is 44.11 kg/m as calculated from the following:   Height as of this encounter: 5\' 4"  (1.626 m).   Weight as of this encounter: 116.6 kg. Advance diet Up with therapy   Labs reviewed this AM, Hg 7.8. Continue with iron. Pain is better controlled this morning. Pt is passing gas without pain.  Continue to work on BM. Up with therapy today.  Follow-up with [facility] orthopedics in 2 weeks  DVT Prophylaxis - Lovenox, Foot Pumps and TED hose Toe-touch weightbearing to the left lower extremity.  Evon Slack, PA-C [facility] 05/15/2018, 8:07 AM

## 2018-05-15 NOTE — Progress Notes (Signed)
Occupational Therapy Treatment Patient Details Name: Brenda Wu MRN: [id number] DOB: [birth year] Today's Date: 05/15/2018    History of present illness Brenda Wu  is a 63 y.o. female with a known history of combined systolic and diastolic heart failure,  HTN, COPD, NICM, NSTEMI, seizures, and stroke, diabetes mellitus type 2 and a mechanical fall at home resulting in severe left knee pain, found to have displaced supracondylar fracture of distal femur wtih intact hardware. Pt is s/p R ORIF distal femur fx of periprosthetic   OT comments  Pt. education was provided about A/E use for LE ADLs with verbal cues, and visual demonstration. Pt. was able to return demonstration with cues and assist. Pt. Continues benefit from OT services for ADL training, A/E training, and pt. Education about home modification, and DME. Pt. Reports that she would like to return home upon discharge. Pt. Would benefit from follow-up OT services upon discharge.   Follow Up Recommendations  SNF    Equipment Recommendations  Other (comment)    Recommendations for Other Services      Precautions / Restrictions Precautions Precautions: Fall Restrictions Weight Bearing Restrictions: Yes LLE Weight Bearing: Touchdown weight bearing       Mobility Bed Mobility Overal bed mobility: Needs Assistance Bed Mobility: Supine to Sit     Supine to sit: Min assist     General bed mobility comments: Assist for LLE  Transfers Overall transfer level: Needs assistance Equipment used: Rolling walker (2 wheeled) Transfers: Lateral/Scoot Transfers Sit to Stand: Min assist Stand pivot transfers: Min assist;Min guard       General transfer comment: Mobility per PT    Balance                             ADL either performed or assessed with clinical judgement   ADL Overall ADL's : Needs assistance/impaired Eating/Feeding: Independent;Sitting   Grooming: Set up;Sitting   Upper Body  Bathing: Set up;Sitting   Lower Body Bathing: Moderate assistance   Upper Body Dressing : Set up;Sitting   Lower Body Dressing: Moderate assistance                 General ADL Comments: Pt. education was provided about A/E use for LE ADLs.     Vision       Perception     Praxis      Cognition Arousal/Alertness: Awake/alert Behavior During Therapy: WFL for tasks assessed/performed Overall Cognitive Status: Within Functional Limits for tasks assessed                                          Exercises   Shoulder Instructions       General Comments      Pertinent Vitals/ Pain       Pain Assessment: 5/10 Pain Score: 5/10 Faces Pain Scale: Hurts whole lot Pain Location: LLE Pain Descriptors / Indicators: Constant;Grimacing;Operative site guarding(it hurts) Pain Intervention(s): Limited activity within patient's tolerance  Home Living                                          Prior Functioning/Environment              Frequency  Min 2X/week  Progress Toward Goals  OT Goals(current goals can now be found in the care plan section)  Progress towards OT goals: Progressing toward goals  Acute Rehab OT Goals OT Goal Formulation: With patient Time For Goal Achievement: 05/27/18 Potential to Achieve Goals: Good  Plan      Co-evaluation                 AM-PAC OT "6 Clicks" Daily Activity     Outcome Measure   Help from another person eating meals?: None Help from another person taking care of personal grooming?: None Help from another person toileting, which includes using toliet, bedpan, or urinal?: A Lot Help from another person bathing (including washing, rinsing, drying)?: A Lot Help from another person to put on and taking off regular upper body clothing?: None   6 Click Score: 16    End of Session Equipment Utilized During Treatment: Gait belt;Rolling walker;Oxygen  OT Visit Diagnosis:  Other abnormalities of gait and mobility (R26.89);History of falling (Z91.81);Muscle weakness (generalized) (M62.81);Pain;Unsteadiness on feet (R26.81) Pain - Right/Left: Left   Activity Tolerance Patient tolerated treatment well   Patient Left in chair;with call bell/phone within reach;with family/visitor present;with nursing/sitter in room   Nurse Communication Mobility status        Time: 9753-0051 OT Time Calculation (min): 22 min  Charges: OT General Charges $OT Visit: 1 Visit OT Treatments $Self Care/Home Management : 8-22 mins  Olegario Messier, MS, OTR/L    Olegario Messier 05/15/2018, 4:00 PM

## 2018-05-16 LAB — CBC
HCT: 25.9 % — ABNORMAL LOW (ref 36.0–46.0)
Hemoglobin: 7.8 g/dL — ABNORMAL LOW (ref 12.0–15.0)
MCH: 29.3 pg (ref 26.0–34.0)
MCHC: 30.1 g/dL (ref 30.0–36.0)
MCV: 97.4 fL (ref 80.0–100.0)
NRBC: 1.7 % — AB (ref 0.0–0.2)
Platelets: 171 10*3/uL (ref 150–400)
RBC: 2.66 MIL/uL — AB (ref 3.87–5.11)
RDW: 16.8 % — ABNORMAL HIGH (ref 11.5–15.5)
WBC: 8.2 10*3/uL (ref 4.0–10.5)

## 2018-05-16 LAB — GLUCOSE, CAPILLARY
Glucose-Capillary: 193 mg/dL — ABNORMAL HIGH (ref 70–99)
Glucose-Capillary: 98 mg/dL (ref 70–99)
Glucose-Capillary: 160 mg/dL — ABNORMAL HIGH (ref 70–99)

## 2018-05-16 MED ORDER — FERROUS SULFATE 325 (65 FE) MG PO TABS: 325.0000 mg | ORAL_TABLET | Freq: Two times a day (BID) | ORAL | 3 refills | Status: DC

## 2018-05-16 MED ORDER — HYDROCODONE-ACETAMINOPHEN 5-325 MG PO TABS: 1.0000 | ORAL_TABLET | ORAL | 0 refills | Status: DC | PRN

## 2018-05-16 MED ORDER — DOCUSATE SODIUM 100 MG PO CAPS: 100.0000 mg | ORAL_CAPSULE | Freq: Two times a day (BID) | ORAL | 0 refills | Status: AC

## 2018-05-16 MED ORDER — TORSEMIDE 20 MG PO TABS: 20.0000 mg | ORAL_TABLET | Freq: Every day | ORAL | 0 refills | Status: DC

## 2018-05-16 MED ORDER — METHOCARBAMOL 500 MG PO TABS: 500.0000 mg | ORAL_TABLET | Freq: Four times a day (QID) | ORAL | 0 refills | Status: DC | PRN

## 2018-05-16 NOTE — Progress Notes (Signed)
  Subjective: 4 Days Post-Op Procedure(s) (LRB): OPEN REDUCTION INTERNAL FIXATION (ORIF) DISTAL FEMUR FRACTURE (Left) Patient reports pain as moderate. Pain improving Patient is well, and has had no acute complaints or problems Patient is currently refusing SNF, will plan for discharge home with HHPT at this time. Negative for chest pain and shortness of breath Fever: no Gastrointestinal:Negative for nausea and vomiting  Objective: Vital signs in last 24 hours: Temp:  [98.1 F (36.7 C)-98.9 F (37.2 C)] 98.9 F (37.2 C) (03/24 0720) Pulse Rate:  [88-97] 90 (03/24 0720) Resp:  [12-19] 16 (03/24 0720) BP: (88-125)/(43-68) 109/62 (03/24 0720) SpO2:  [92 %-100 %] 100 % (03/24 0720) Weight:  [103 kg] 103 kg (03/24 0500)  Intake/Output from previous day:  Intake/Output Summary (Last 24 hours) at 05/16/2018 1105 Last data filed at 05/16/2018 0557 Gross per 24 hour  Intake 600 ml  Output 1550 ml  Net -950 ml    Intake/Output this shift: No intake/output data recorded.  Labs: Recent Labs    05/14/18 0508 05/15/18 0333 05/16/18 0430  HGB 9.0* 7.8* 7.8*   Recent Labs    05/15/18 0333 05/16/18 0430  WBC 7.3 8.2  RBC 2.66* 2.66*  HCT 25.8* 25.9*  PLT 127* 171   Recent Labs    05/14/18 0508 05/15/18 0333  NA 136 137  K 4.0 4.3  CL 104 104  CO2 27 27  BUN 12 14  CREATININE 0.84 0.84  GLUCOSE 126* 126*  CALCIUM 9.0 8.6*   No results for input(s): LABPT, INR in the last 72 hours.   EXAM General - Patient is Alert, Appropriate and Oriented Extremity - ABD soft Sensation intact distally Intact pulses distally Dorsiflexion/Plantar flexion intact Incision: dressing C/D/I No cellulitis present Dressing/Incision - No drainage to the honeycomb dressings this AM. Motor Function - intact, moving foot and toes well on exam.  Abdomen - soft with normal BS.  Past Medical History:  Diagnosis Date  . Chronic combined systolic (congestive) and diastolic (congestive)  heart failure (HCC)    a. 10/2017 Echo: EF 45-50%, diff HK. Mild to mod MR, mildly dil LA. Mod dil RV w/ mildly reduced RV fxn.  [name] COPD (chronic obstructive pulmonary disease) (HCC)   . Depression   . Diabetes mellitus without complication (HCC)   . Hypercholesteremia   . Hypertension   . Morbid obesity (HCC)   . NICM (nonischemic cardiomyopathy) (HCC)   . NSTEMI (non-ST elevated myocardial infarction) (HCC)    a. 12/2017 -->Cath: nl cors. EF 45-50%.  . Seizures (HCC)   . Stroke (HCC)   . Tricuspid regurgitation    a. 10/2017 Echo: Mild to mod TR.    Assessment/Plan: 4 Days Post-Op Procedure(s) (LRB): OPEN REDUCTION INTERNAL FIXATION (ORIF) DISTAL FEMUR FRACTURE (Left) Active Problems:   Fracture of prosthetic knee (HCC)  Estimated body mass index is 38.96 kg/m as calculated from the following:   Height as of this encounter: 5\' 4"  (1.626 m).   Weight as of this encounter: 103 kg. Advance diet Up with therapy   Labs reviewed this AM, Hg 7.8, stable. Continue with iron. Pain improving Up with therapy today. TTWB LLE Patient refusing SNF, discharge to home pending progress with PT  Follow-up with [facility] orthopedics in 2 weeks for staple removal  DVT Prophylaxis - Lovenox, Foot Pumps and TED hose Toe-touch weightbearing to the left lower extremity.  Evon Slack, PA-C [facility] 05/16/2018, 11:05 AM

## 2018-05-16 NOTE — Progress Notes (Signed)
Physical Therapy Treatment Patient Details Name: Brenda Wu MRN: [id number] DOB: 1955/09/06 Today's Date: 05/16/2018    History of Present Illness Brenda Wu  is a 63 y.o. female with a known history of combined systolic and diastolic heart failure,  HTN, COPD, NICM, NSTEMI, seizures, and stroke, diabetes mellitus type 2 and a mechanical fall at home resulting in severe left knee pain, found to have displaced supracondylar fracture of distal femur wtih intact hardware. Pt is s/p R ORIF distal femur fx of periprosthetic    PT Comments    Pt agreeable to PT; continues with L knee pain 8/10. Pt demonstrates good effort throughout session with exercises and all mobility. Mobility continues to be very slow, but improved ability for bed mobility with Mod I. STS continues Min A and short ambulation (4 ft) with Min A/Min guard. Maintains TWB on LLE, but fatigues easily. Pt with current discharge today; continue to recommend skilled nursing facility due to pt lack of ability to ambulate safely without guard/assist and for very limited distances. Pt refusing skilled nursing facility at this time due to fear of COVID-19.   Follow Up Recommendations  SNF     Equipment Recommendations  3in1 (PT)    Recommendations for Other Services       Precautions / Restrictions Precautions Precautions: Fall Precaution Comments: requires oxygen Restrictions Weight Bearing Restrictions: Yes LLE Weight Bearing: Touchdown weight bearing    Mobility  Bed Mobility Overal bed mobility: Modified Independent Bed Mobility: Supine to Sit     Supine to sit: Modified independent (Device/Increase time);HOB elevated     General bed mobility comments: Great increased time to mobilize BLE off of the bed to the L. Pt educated on self assist with RLE but chooses to actively move slowly. Does demonstrate improved ability to active hip/knee flex as previously instructed   Transfers Overall transfer level:  Needs assistance Equipment used: Rolling walker (2 wheeled) Transfers: Sit to/from Stand Sit to Stand: Min assist         General transfer comment: Cues for hand placement for stand and sit with good follow through post cues. Slow to rise/sit; improved LLE placement  Ambulation/Gait Ambulation/Gait assistance: Min guard;Min assist Gait Distance (Feet): 4 Feet Assistive device: Rolling walker (2 wheeled) Gait Pattern/deviations: Step-to pattern;Antalgic Gait velocity: slow Gait velocity interpretation: <1.31 ft/sec, indicative of household ambulator General Gait Details: Maintains TWB. Inconsistent between low clearance steps and pivot on RLE.   Stairs             Wheelchair Mobility    Modified Rankin (Stroke Patients Only)       Balance Overall balance assessment: Needs assistance Sitting-balance support: Bilateral upper extremity supported Sitting balance-Leahy Scale: Good     Standing balance support: Bilateral upper extremity supported Standing balance-Leahy Scale: Fair                              Cognition Arousal/Alertness: Awake/alert Behavior During Therapy: WFL for tasks assessed/performed Overall Cognitive Status: Within Functional Limits for tasks assessed                                        Exercises General Exercises - Lower Extremity Ankle Circles/Pumps: AROM;Both;20 reps Quad Sets: Strengthening;Both;20 reps Gluteal Sets: Strengthening;Both;20 reps Short Arc Quad: AROM;Left;10 reps;Seated;Other (comment)(partial initial range) Hip ABduction/ADduction: Strengthening;Left;10 reps;Standing Straight Leg Raises:  Strengthening;Left;10 reps;Standing Hip Flexion/Marching: Strengthening;AROM;Left;10 reps;Standing    General Comments        Pertinent Vitals/Pain Pain Assessment: 0-10 Pain Score: 8  Pain Location: LLE Pain Descriptors / Indicators: Constant;Aching;Grimacing Pain Intervention(s): Monitored  during session;Premedicated before session;Repositioned    Home Living                      Prior Function            PT Goals (current goals can now be found in the care plan section) Acute Rehab PT Goals Patient Stated Goal: return to ind.  Progress towards PT goals: Progressing toward goals(slowly)    Frequency    BID      PT Plan Current plan remains appropriate    Co-evaluation              AM-PAC PT "6 Clicks" Mobility   Outcome Measure  Help needed turning from your back to your side while in a flat bed without using bedrails?: A Little Help needed moving from lying on your back to sitting on the side of a flat bed without using bedrails?: A Little Help needed moving to and from a bed to a chair (including a wheelchair)?: A Little Help needed standing up from a chair using your arms (e.g., wheelchair or bedside chair)?: A Little Help needed to walk in hospital room?: A Lot Help needed climbing 3-5 steps with a railing? : Total 6 Click Score: 15    End of Session Equipment Utilized During Treatment: Gait belt;Oxygen Activity Tolerance: Patient tolerated treatment well Patient left: in chair;with call bell/phone within reach;with chair alarm set   PT Visit Diagnosis: Unsteadiness on feet (R26.81);Other abnormalities of gait and mobility (R26.89);Muscle weakness (generalized) (M62.81);Pain Pain - Right/Left: Left Pain - part of body: Leg     Time: 7544-9201 PT Time Calculation (min) (ACUTE ONLY): 39 min  Charges:  $Gait Training: 8-22 mins $Therapeutic Exercise: 8-22 mins $Therapeutic Activity: 8-22 mins                      Scot Dock, PTA 05/16/2018, 12:40 PM

## 2018-05-16 NOTE — Progress Notes (Signed)
[facility] [facility] [place] [facility]   PATIENT NAME: Brenda Wu    MR#:  [id number]  DATE OF BIRTH:  11/28/1955  SUBJECTIVE:  CHIEF COMPLAINT:   Chief Complaint  Patient presents with   Fall   -Status post left periprosthetic knee fracture repair.  Postop day 4.  -Feels like her pain is improving.  Wants to go home today  REVIEW OF SYSTEMS:  Review of Systems  Constitutional: Negative for chills, fever and malaise/fatigue.  HENT: Negative for congestion, ear discharge, hearing loss and nosebleeds.   Eyes: Negative for blurred vision and double vision.  Respiratory: Negative for cough, shortness of breath and wheezing.   Cardiovascular: Negative for chest pain and palpitations.  Gastrointestinal: Negative for abdominal pain, constipation, diarrhea, nausea and vomiting.  Genitourinary: Negative for dysuria.  Musculoskeletal: Positive for joint pain and myalgias.  Neurological: Negative for dizziness, seizures and headaches.  Psychiatric/Behavioral: Negative for depression.    DRUG ALLERGIES:   Allergies  Allergen Reactions   Metformin Other (See Comments)    Other reaction(s): Other (See Comments) Constipation, dry mouth, dizziness   Bismuth Subsalicylate Rash    Pepto Bismol    VITALS:  Blood pressure 109/62, pulse 90, temperature 98.9 F (37.2 C), temperature source Oral, resp. rate 16, height 5\' 4"  (1.626 m), weight 103 kg, SpO2 100 %.  PHYSICAL EXAMINATION:  Physical Exam   GENERAL:  64 y.o.-year-old patient lying in the bed with no acute distress.  EYES: Pupils equal, round, reactive to light and accommodation. No scleral icterus. Extraocular muscles intact.  HEENT: Head atraumatic, normocephalic. Oropharynx and nasopharynx clear.  NECK:  Supple, no jugular venous distention. No thyroid enlargement, no tenderness.  LUNGS: Normal breath sounds bilaterally, no  rales,rhonchi or crepitation.  Occasional scattered wheezes present.  No use  of accessory muscles of respiration.  Decreased bibasilar breath sounds. CARDIOVASCULAR: S1, S2 normal. No murmurs, rubs, or gallops.  ABDOMEN: Soft, nontender, nondistended. Bowel sounds present. No organomegaly or mass.  EXTREMITIES: Significant swelling and tenderness around the left knee extending proximally.  No pedal edema, cyanosis, or clubbing.  Staples in place NEUROLOGIC: Cranial nerves II through XII are intact. Muscle strength 5/5 in all extremities. Sensation intact. Gait not checked.  PSYCHIATRIC: The patient is alert and oriented x 3.  SKIN: No obvious rash, lesion, or ulcer.    LABORATORY PANEL:   CBC Recent Labs  Lab 05/16/18 0430  WBC 8.2  HGB 7.8*  HCT 25.9*  PLT 171   ------------------------------------------------------------------------------------------------------------------  Chemistries  Recent Labs  Lab 05/12/18 0553  05/15/18 0333  NA 140   < > 137  K 3.9   < > 4.3  CL 105   < > 104  CO2 27   < > 27  GLUCOSE 209*   < > 126*  BUN 19   < > 14  CREATININE 1.21*   < > 0.84  CALCIUM 8.9   < > 8.6*  AST 31  --   --   ALT 17  --   --   ALKPHOS 73  --   --   BILITOT 1.0  --   --    < > = values in this interval not displayed.   ------------------------------------------------------------------------------------------------------------------  Cardiac Enzymes No results for input(s): TROPONINI in the last 168 hours. ------------------------------------------------------------------------------------------------------------------  RADIOLOGY:  No results found.  EKG:   Orders placed or performed in visit on 05/12/18   EKG 12-Lead    ASSESSMENT AND PLAN:  63 year old female with past medical history significant for obesity, combined CHF, COPD on 2 L chronic home O2, diabetes came in after a mechanical fall and left supracondylar femoral periprosthetic fracture.  1.  Left displaced supracondylar femoral fracture-periprosthetic due to prior  history of left knee replacement. -Appreciate Ortho consult.  Status post open reduction and internal fixation.  Postop day 4 today. -Still has some pain, improving now -Pain medications have been adjusted.  Continue ice pack -Physical therapy recommended rehab but patient wants to go home.  Once pain is improved and patient able to do basic ADLs with a walker, will be discharged home with home health.  2.  COPD-stable.  Continue 2 L oxygen which is chronic. -Continue inhalers and nebulizers as needed  3.  Hypokalemia- replaced  4.  Diabetes mellitus-on glipizide and sliding scale insulin  5.  Acute cystitis-finished Rocephin.  6.  DVT prophylaxis-on Lovenox  7.  Acute on chronic postop anemia-could be blood loss and also dilution from fluids. -Stable at this time. -No active bleeding.  If continues to drop, consider CT of the knee given the swelling and the tenderness to rule out hematoma. -No indication for transfusion as long as hemoglobin greater than 7.  Started on iron supplements.     Physical therapy consulted-refused rehab. Possible discharge home  with home health-may be today   All the records are reviewed and case discussed with Care Management/Social Workerr. Management plans discussed with the patient, family and they are in agreement.  CODE STATUS: Full code  TOTAL TIME TAKING CARE OF THIS PATIENT: 36 minutes.   POSSIBLE D/C IN 1-2 DAYS, DEPENDING ON CLINICAL CONDITION.   [name] M.D on 05/16/2018 at 10:16 AM  Between 7am to 6pm - Pager - [phone]  After 6pm go to [email or web address] - password [email or web address]  [facility]  Office  [phone]  CC: Primary care physician; Cheron Schaumann., MD

## 2018-05-16 NOTE — TOC Transition Note (Signed)
Transition of Care [facility]) - CM/SW Discharge Note  Discharge home3/24/20 with Home Health with [facility], Gave good RX card to [facility]kp with Lovenox $91.52 at [facility] with insurance is $277.00 Patient has equipment at home including RW, Rollator and NOV.  No more DME needs   Patient Details  Name: Brenda Wu MRN: [id number] Date of Birth: 1955-03-18  Transition of Care [facility]) CM/SW Contact:  Barrie Dunker, RN Phone Number: 05/16/2018, 11:39 AM   Clinical Narrative:       Final next level of care: Home w Home Health Services Barriers to Discharge: Barriers Resolved   Patient Goals and CMS Choice Patient states their goals for this hospitalization and ongoing recovery are:: "I want to be able to walk at least a few feet." CMS Medicare.gov Compare Post Acute Care list provided to:: Patient Choice offered to / list presented to : Patient  Discharge Placement                       Discharge Plan and Services   Discharge Planning Services: Follow-up appt scheduled Post Acute Care Choice: Home Health          DME Arranged: N/A DME Agency: NA HH Arranged: OT, PT, Nurse's Aide, NIV HH Agency: [facility]   Social Determinants of Health (SDOH) Interventions     Readmission Risk Interventions Readmission Risk Prevention Plan 05/13/2018  Transportation Screening Complete  HRI or Home Care Consult Complete  SW Recovery Care/Counseling Consult Complete

## 2018-05-16 NOTE — Progress Notes (Signed)
Patient is alert and oriented and able to verbalize needs. No complaints of pain at this time. VSS. PIV removed. Printed AVS and scripts given to patient in discharge packet. All instructions gone over with patient. No concerns voiced.   Suzan Slick, RN

## 2018-05-16 NOTE — Progress Notes (Signed)
Occupational Therapy Treatment Patient Details Name: Brenda Wu MRN: [id number] DOB: [birth year] Today's Date: 05/16/2018    History of present illness Dealie Baluyot  is a 63 y.o. female with a known history of combined systolic and diastolic heart failure,  HTN, COPD, NICM, NSTEMI, seizures, and stroke, diabetes mellitus type 2 and a mechanical fall at home resulting in severe left knee pain, found to have displaced supracondylar fracture of distal femur wtih intact hardware. Pt is s/p R ORIF distal femur fx of periprosthetic   OT comments  Pt. education was provided about A/E use. Pt. was agreeable to practice with A/E on the RLE, secondary to 15/10 pain in the Left LE. Pt. required cues for visual demonstration, and proper technique. Pt. Continues to require additional A/E training for ADL, and IADL functioning. Pt. continues to benefit from OT services for ADL training, A/E training, and pt. education about home modification, and DME. Pt. would benefit from follow-up OT services upon discharge.    Follow Up Recommendations  SNF    Equipment Recommendations       Recommendations for Other Services      Precautions / Restrictions Precautions Precautions: Fall Precaution Comments: requires oxygen Restrictions Weight Bearing Restrictions: Yes LLE Weight Bearing: Touchdown weight bearing       Mobility Bed Mobility                  Transfers                      Balance                                           ADL either performed or assessed with clinical judgement   ADL Overall ADL's : Needs assistance/impaired Eating/Feeding: Independent;Sitting   Grooming: Set up;Sitting Grooming Details (indicate cue type and reason): umable to tolerate standing, reliant on ext support Upper Body Bathing: Set up;Sitting   Lower Body Bathing: Moderate assistance   Upper Body Dressing : Set up;Sitting   Lower Body Dressing: Moderate  assistance                 General ADL Comments: Pt. education was provided about A/E use for LE ADLs.     Vision Patient Visual Report: No change from baseline     Perception     Praxis      Cognition Arousal/Alertness: Awake/alert Behavior During Therapy: WFL for tasks assessed/performed Overall Cognitive Status: Within Functional Limits for tasks assessed                                          Exercises     Shoulder Instructions       General Comments      Pertinent Vitals/ Pain       Pain Score: 10-Worst pain ever(15/10 Left knee) Pain Descriptors / Indicators: Constant Pain Intervention(s): Limited activity within patient's tolerance;Monitored during session  Home Living                                          Prior Functioning/Environment              Frequency  Min 2X/week        Progress Toward Goals  OT Goals(current goals can now be found in the care plan section)  Progress towards OT goals: Progressing toward goals  Acute Rehab OT Goals Patient Stated Goal: return to ind.  OT Goal Formulation: With patient Potential to Achieve Goals: Good  Plan      Co-evaluation                 AM-PAC OT "6 Clicks" Daily Activity     Outcome Measure   Help from another person eating meals?: None Help from another person taking care of personal grooming?: None Help from another person toileting, which includes using toliet, bedpan, or urinal?: A Lot Help from another person bathing (including washing, rinsing, drying)?: A Lot Help from another person to put on and taking off regular upper body clothing?: None Help from another person to put on and taking off regular lower body clothing?: A Lot 6 Click Score: 18    End of Session Equipment Utilized During Treatment: Gait belt;Rolling walker;Oxygen  OT Visit Diagnosis: Other abnormalities of gait and mobility (R26.89);History of falling  (Z91.81);Muscle weakness (generalized) (M62.81);Pain;Unsteadiness on feet (R26.81) Pain - Right/Left: Left Pain - part of body: Leg;Hip   Activity Tolerance Patient tolerated treatment well   Patient Left in chair;with call bell/phone within reach;with family/visitor present;with nursing/sitter in room   Nurse Communication Mobility status        Time: 1013-1030 OT Time Calculation (min): 17 min  Charges: OT General Charges $OT Visit: 1 Visit OT Treatments $Self Care/Home Management : 8-22 mins  Olegario Messier, MS, OTR/L  Olegario Messier 05/16/2018, 11:09 AM

## 2018-05-17 NOTE — Discharge Summary (Signed)
[facility] [place] at [facility]   PATIENT NAME: Brenda Wu    MR#:  [id number]  DATE OF BIRTH:  1956-01-06  DATE OF ADMISSION:  05/12/2018   ADMITTING PHYSICIAN: Katha Hamming, MD  DATE OF DISCHARGE: 05/16/2018  2:02 PM  PRIMARY CARE PHYSICIAN: Cheron Schaumann., MD   ADMISSION DIAGNOSIS:   Pain [R52] Pre-op chest exam [Z01.811] Displaced comminuted fracture of shaft of right femur, initial encounter for closed fracture (HCC) [S72.351A]  DISCHARGE DIAGNOSIS:   Active Problems:   Fracture of prosthetic knee (HCC)   SECONDARY DIAGNOSIS:   Past Medical History:  Diagnosis Date  . Chronic combined systolic (congestive) and diastolic (congestive) heart failure (HCC)    a. 10/2017 Echo: EF 45-50%, diff HK. Mild to mod MR, mildly dil LA. Mod dil RV w/ mildly reduced RV fxn.  [name] COPD (chronic obstructive pulmonary disease) (HCC)   . Depression   . Diabetes mellitus without complication (HCC)   . Hypercholesteremia   . Hypertension   . Morbid obesity (HCC)   . NICM (nonischemic cardiomyopathy) (HCC)   . NSTEMI (non-ST elevated myocardial infarction) (HCC)    a. 12/2017 -->Cath: nl cors. EF 45-50%.  . Seizures (HCC)   . Stroke (HCC)   . Tricuspid regurgitation    a. 10/2017 Echo: Mild to mod TR.    HOSPITAL COURSE:   63 year old female with past medical history significant for obesity, combined CHF, COPD on 2 L chronic home O2, diabetes came in after a mechanical fall and left supracondylar femoral periprosthetic fracture.  1.  Left displaced supracondylar femoral fracture-periprosthetic due to prior history of left knee replacement. -Appreciate Ortho consult.  Status post open reduction and internal fixation.  Postop day 4 today. -Still has some pain, improving now -Pain medications have been adjusted.  Continue ice pack -Physical therapy recommended rehab but patient wants to go home.  Once pain is improved and patient able to do  basic ADLs with a walker, will be discharged home with home health. -Lovenox for 2 weeks DVT prophylaxis  2.  COPD-stable.  Continue 2 L oxygen which is chronic. -Continue inhalers and nebulizers as needed  3.  Hypokalemia- replaced  4.  Diabetes mellitus-on glipizide.  Januvia discontinued  5.  Acute cystitis-finished Rocephin.  6.  DVT prophylaxis-on Lovenox for 2 weeks  7.  Acute on chronic postop anemia-could be blood loss and also dilution from fluids. -Stable at this time. -No active bleeding. -No indication for transfusion as long as hemoglobin greater than 7.  Started on iron supplements.     Physical therapy consulted-refused rehab. P  discharge home  with home health- today  DISCHARGE CONDITIONS:   Guarded  CONSULTS OBTAINED:   Treatment Team:  Kennedy Bucker, MD  DRUG ALLERGIES:   Allergies  Allergen Reactions  . Metformin Other (See Comments)    Other reaction(s): Other (See Comments) Constipation, dry mouth, dizziness  . Bismuth Subsalicylate Rash    Pepto Bismol   DISCHARGE MEDICATIONS:   Allergies as of 05/16/2018      Reactions   Metformin Other (See Comments)   Other reaction(s): Other (See Comments) Constipation, dry mouth, dizziness   Bismuth Subsalicylate Rash   Pepto Bismol      Medication List    STOP taking these medications   Januvia 50 MG tablet Generic drug:  sitaGLIPtin   Lantus SoloStar 100 UNIT/ML Solostar Pen Generic drug:  Insulin Glargine   losartan 50 MG tablet Commonly known as:  COZAAR     TAKE these medications   albuterol 108 (90 Base) MCG/ACT inhaler Commonly known as:  PROVENTIL HFA;VENTOLIN HFA Inhale 2 puffs into the lungs 4 (four) times daily as needed for wheezing or shortness of breath.   albuterol (2.5 MG/3ML) 0.083% nebulizer solution Commonly known as:  PROVENTIL Inhale 2.5 mg into the lungs every 6 (six) hours as needed for wheezing or shortness of breath.   aspirin EC 81 MG tablet Take 1  tablet (81 mg total) by mouth daily.   atorvastatin 40 MG tablet Commonly known as:  LIPITOR Take 40 mg by mouth every evening.   diltiazem 180 MG 24 hr capsule Commonly known as:  CARDIZEM CD Take 180 mg by mouth daily.   docusate sodium 100 MG capsule Commonly known as:  COLACE Take 1 capsule (100 mg total) by mouth 2 (two) times daily.   enoxaparin 40 MG/0.4ML injection Commonly known as:  LOVENOX Inject 0.4 mLs (40 mg total) into the skin daily for 14 days.   ferrous sulfate 325 (65 FE) MG tablet Take 1 tablet (325 mg total) by mouth 2 (two) times daily with a meal. What changed:  when to take this   glipiZIDE 10 MG tablet Commonly known as:  GLUCOTROL Take 10 mg by mouth 2 (two) times daily before a meal.   guaiFENesin 600 MG 12 hr tablet Commonly known as:  MUCINEX Take 600 mg by mouth 2 (two) times daily as needed for cough.   HYDROcodone-acetaminophen 5-325 MG tablet Commonly known as:  NORCO/VICODIN Take 1-2 tablets by mouth every 4 (four) hours as needed for moderate pain or severe pain.   lamoTRIgine 200 MG tablet Commonly known as:  LAMICTAL Take 200 mg by mouth 2 (two) times daily.   methocarbamol 500 MG tablet Commonly known as:  ROBAXIN Take 1 tablet (500 mg total) by mouth every 6 (six) hours as needed for muscle spasms.   Multi-Vitamins Tabs Take 1 tablet by mouth daily.   niacin 500 MG tablet Take 500 mg by mouth daily.   potassium chloride SA 20 MEQ tablet Commonly known as:  K-DUR,KLOR-CON Take 1 tablet (20 mEq total) by mouth daily.   pregabalin 75 MG capsule Commonly known as:  LYRICA Take 75 mg by mouth 2 (two) times daily.   senna 8.6 MG Tabs tablet Commonly known as:  SENOKOT Take 1 tablet by mouth daily.   Symbicort 160-4.5 MCG/ACT inhaler Generic drug:  budesonide-formoterol Inhale 2 puffs into the lungs 2 (two) times daily.   tiotropium 18 MCG inhalation capsule Commonly known as:  SPIRIVA Place 18 mcg into inhaler and  inhale daily.   topiramate 200 MG tablet Commonly known as:  TOPAMAX Take 200 mg by mouth 2 (two) times daily.   torsemide 20 MG tablet Commonly known as:  DEMADEX Take 1 tablet (20 mg total) by mouth daily. What changed:  how much to take   vitamin B-12 500 MCG tablet Commonly known as:  CYANOCOBALAMIN Take 500 mcg by mouth daily.        DISCHARGE INSTRUCTIONS:   1.  PCP follow-up in 1 to 2 weeks 2.  Orthopedics follow-up in 2 weeks  DIET:   Cardiac diet  ACTIVITY:   Activity as tolerated  OXYGEN:   Home Oxygen: Yes.    Oxygen Delivery: 2-3 liters/min via Patient connected to nasal cannula oxygen  DISCHARGE LOCATION:   home   If you experience worsening of your admission symptoms, develop shortness of breath,  life threatening emergency, suicidal or homicidal thoughts you must seek medical attention immediately by calling 911 or calling your MD immediately  if symptoms less severe.  You Must read complete instructions/literature along with all the possible adverse reactions/side effects for all the Medicines you take and that have been prescribed to you. Take any new Medicines after you have completely understood and accpet all the possible adverse reactions/side effects.   Please note  You were cared for by a hospitalist during your hospital stay. If you have any questions about your discharge medications or the care you received while you were in the hospital after you are discharged, you can call the unit and asked to speak with the hospitalist on call if the hospitalist that took care of you is not available. Once you are discharged, your primary care physician will handle any further medical issues. Please note that NO REFILLS for any discharge medications will be authorized once you are discharged, as it is imperative that you return to your primary care physician (or establish a relationship with a primary care physician if you do not have one) for your aftercare  needs so that they can reassess your need for medications and monitor your lab values.    On the day of Discharge:  VITAL SIGNS:   Blood pressure 109/62, pulse 92, temperature 98.9 F (37.2 C), temperature source Oral, resp. rate 16, height  (1.626 m), weight 103 kg, SpO2 96 %.  PHYSICAL EXAMINATION:     GENERAL:  63 y.o.-year-old patient lying in the bed with no acute distress.  EYES: Pupils equal, round, reactive to light and accommodation. No scleral icterus. Extraocular muscles intact.  HEENT: Head atraumatic, normocephalic. Oropharynx and nasopharynx clear.  NECK:  Supple, no jugular venous distention. No thyroid enlargement, no tenderness.  LUNGS: Normal breath sounds bilaterally, no  rales,rhonchi or crepitation.  Occasional scattered wheezes present.  No use of accessory muscles of respiration.  Decreased bibasilar breath sounds. CARDIOVASCULAR: S1, S2 normal. No murmurs, rubs, or gallops.  ABDOMEN: Soft, nontender, nondistended. Bowel sounds present. No organomegaly or mass.  EXTREMITIES: Significant swelling and tenderness around the left knee extending proximally.  No pedal edema, cyanosis, or clubbing.  Staples in place NEUROLOGIC: Cranial nerves II through XII are intact. Muscle strength 5/5 in all extremities. Sensation intact. Gait not checked.  PSYCHIATRIC: The patient is alert and oriented x 3.  SKIN: No obvious rash, lesion, or ulcer.  DATA REVIEW:   CBC Recent Labs  Lab 05/16/18 0430  WBC 8.2  HGB 7.8*  HCT 25.9*  PLT 171    Chemistries  Recent Labs  Lab 05/12/18 0553  05/15/18 0333  NA 140   < > 137  K 3.9   < > 4.3  CL 105   < > 104  CO2 27   < > 27  GLUCOSE 209*   < > 126*  BUN 19   < > 14  CREATININE 1.21*   < > 0.84  CALCIUM 8.9   < > 8.6*  AST 31  --   --   ALT 17  --   --   ALKPHOS 73  --   --   BILITOT 1.0  --   --    < > = values in this interval not displayed.     Microbiology Results  Results for orders placed or performed  during the hospital encounter of 05/12/18  MRSA PCR Screening     Status: None  Collection Time: 05/12/18 11:27 AM  Result Value Ref Range Status   MRSA by PCR NEGATIVE NEGATIVE Final    Comment:        The GeneXpert MRSA Assay (FDA approved for NASAL specimens only), is one component of a comprehensive MRSA colonization surveillance program. It is not intended to diagnose MRSA infection nor to guide or monitor treatment for MRSA infections. Performed at [facility], [address], [place], [place] [address]     RADIOLOGY:  No results found.   Management plans discussed with the patient, family and they are in agreement.  CODE STATUS:  Code Status History    Date Active Date Inactive Code Status Order ID Comments User Context   05/12/2018 0750 05/16/2018 1705 Full Code [id number]  Katha Hamming, MD ED   01/05/2018 1719 01/10/2018 1609 Partial Code [id number]  Roseanne Reno, MD Inpatient   01/05/2018 1716 01/05/2018 1719 Full Code [id number]  Shaune Pollack, MD Inpatient   11/23/2017 1506 11/25/2017 1632 Full Code [id number]  Milagros Loll, MD ED   11/04/2017 1538 11/09/2017 1335 Full Code [id number]  Adrian Saran, MD ED   02/07/2017 0909 02/12/2017 1620 Full Code [id number]  Shaune Pollack, MD Inpatient   11/01/2016 1726 11/04/2016 1447 Full Code [id number]  Gracelyn Nurse, MD Inpatient    Advance Directive Documentation     Most Recent Value  Type of Advance Directive  Healthcare Power of Attorney  Pre-existing out of facility DNR order (yellow form or pink MOST form)  -  "MOST" Form in Place?  -      TOTAL TIME TAKING CARE OF THIS PATIENT: 38 minutes.    [name] M.D on 05/17/2018 at 11:48 AM  Between 7am to 6pm - Pager - [phone]  After 6pm go to [email or web address] - Social research officer, government  [facility] [place] [facility]  Office  [phone]  CC: Primary care physician; Cheron Schaumann., MD   Note: This dictation was prepared with Dragon  dictation along with smaller phrase technology. Any transcriptional errors that result from this process are unintentional.

## 2018-05-19 ENCOUNTER — Encounter: Payer: Self-pay | Admitting: *Deleted

## 2018-05-19 ENCOUNTER — Telehealth: Payer: Self-pay | Admitting: *Deleted

## 2018-05-19 DIAGNOSIS — I5042 Chronic combined systolic (congestive) and diastolic (congestive) heart failure: Secondary | ICD-10-CM

## 2018-05-19 NOTE — [place] FL2 (Signed)
[place] MEDICAID FL2 LEVEL OF CARE SCREENING TOOL     IDENTIFICATION  Patient Name: Brenda Wu Birthdate: [birth year] Sex: female Admission Date (Current Location): 05/12/2018  [place] Number:  Chiropodist and Address:  [facility], [address], [place], [place] [address]      Provider Number: [id number]  Attending Physician Name and Address:  No att. providers found  Relative Name and Phone Number:       Current Level of Care: Hospital Recommended Level of Care: Skilled Nursing Facility Prior Approval Number:    Date Approved/Denied:   PASRR Number: [id number]  Discharge Plan: SNF    Current Diagnoses: Patient Active Problem List   Diagnosis Date Noted  . Fracture of prosthetic knee (HCC) 05/12/2018  . NICM (nonischemic cardiomyopathy) (HCC) 02/13/2018  . Non-ST elevation (NSTEMI) myocardial infarction (HCC)   . Chronic combined systolic and diastolic heart failure (HCC) 12/12/2017  . HTN (hypertension) 12/12/2017  . Diabetes (HCC) 12/12/2017  . COPD exacerbation (HCC) 02/07/2017  . History of cerebellar stroke 03/12/2016  . History of seizures 03/12/2016  . Pulmonary nodule, left 01/05/2016  . Paroxysmal atrial fibrillation (HCC) 10/17/2015  . Cerebellar stroke (HCC) 06/26/2015  . Transient diplopia 06/09/2015  . Asthma-chronic obstructive pulmonary disease overlap syndrome (HCC) 05/30/2015  . COPD with asthma (HCC) 05/30/2015  . Hypoxemia 05/30/2015  . Obstructive sleep apnea syndrome 05/30/2015  . OSA on CPAP 05/30/2015  . Obesity (BMI 30-39.9) 03/12/2015  . Bilateral hearing loss 01/22/2015  . Generalized seizure disorder (HCC) 01/22/2015  . Hyperlipidemia associated with type 2 diabetes mellitus (HCC) 01/22/2015  . Hypertension associated with diabetes (HCC) 01/22/2015  . Morbid obesity due to excess calories (HCC) 01/22/2015  . Primary osteoarthritis involving multiple joints 01/22/2015  . Severe  episode of recurrent major depressive disorder, without psychotic features (HCC) 01/22/2015    Orientation RESPIRATION BLADDER Height & Weight     Self, Time, Place  O2(2 liters ) Incontinent Weight: 227 lb (103 kg) Height:  5\' 4"  (162.6 cm)  BEHAVIORAL SYMPTOMS/MOOD NEUROLOGICAL BOWEL NUTRITION STATUS  (None ) (none) Continent Diet(Regular )  AMBULATORY STATUS COMMUNICATION OF NEEDS Skin   Extensive Assist Verbally Surgical wounds(Left Leg Incision )                       Personal Care Assistance Level of Assistance  Bathing, Feeding, Dressing Bathing Assistance: Limited assistance Feeding assistance: Independent Dressing Assistance: Limited assistance     Functional Limitations Info  Sight, Hearing, Speech Sight Info: Adequate Hearing Info: Adequate Speech Info: Adequate    SPECIAL CARE FACTORS FREQUENCY  PT (By licensed PT), OT (By licensed OT)     PT Frequency: 5 OT Frequency: 5            Contractures Contractures Info: Not present    Additional Factors Info  Code Status, Allergies Code Status Info: Full Code  Allergies Info: Metformin, Bismuth Subsalicylate           Current Medications (05/19/2018):  This is the current hospital active medication list No current facility-administered medications for this encounter.    Current Outpatient Medications  Medication Sig Dispense Refill  . albuterol (PROVENTIL HFA;VENTOLIN HFA) 108 (90 Base) MCG/ACT inhaler Inhale 2 puffs into the lungs 4 (four) times daily as needed for wheezing or shortness of breath.     [name] albuterol (PROVENTIL) (2.5 MG/3ML) 0.083% nebulizer solution Inhale 2.5 mg into the lungs every 6 (six) hours as  needed for wheezing or shortness of breath.     [name] aspirin EC 81 MG tablet Take 1 tablet (81 mg total) by mouth daily. 32 tablet 0  . atorvastatin (LIPITOR) 40 MG tablet Take 40 mg by mouth every evening.     . budesonide-formoterol (SYMBICORT) 160-4.5 MCG/ACT inhaler Inhale 2 puffs into  the lungs 2 (two) times daily.    [name] diltiazem (CARDIZEM CD) 180 MG 24 hr capsule Take 180 mg by mouth daily.    [name] glipiZIDE (GLUCOTROL) 10 MG tablet Take 10 mg by mouth 2 (two) times daily before a meal.     . guaiFENesin (MUCINEX) 600 MG 12 hr tablet Take 600 mg by mouth 2 (two) times daily as needed for cough.     . lamoTRIgine (LAMICTAL) 200 MG tablet Take 200 mg by mouth 2 (two) times daily.     . Multiple Vitamin (MULTI-VITAMINS) TABS Take 1 tablet by mouth daily.    . niacin 500 MG tablet Take 500 mg by mouth daily.    . potassium chloride SA (K-DUR,KLOR-CON) 20 MEQ tablet Take 1 tablet (20 mEq total) by mouth daily. 30 tablet 2  . pregabalin (LYRICA) 75 MG capsule Take 75 mg by mouth 2 (two) times daily.    [name] senna (SENOKOT) 8.6 MG TABS tablet Take 1 tablet by mouth daily.     [name] tiotropium (SPIRIVA) 18 MCG inhalation capsule Place 18 mcg into inhaler and inhale daily.    [name] topiramate (TOPAMAX) 200 MG tablet Take 200 mg by mouth 2 (two) times daily.    . vitamin B-12 (CYANOCOBALAMIN) 500 MCG tablet Take 500 mcg by mouth daily.    [name] docusate sodium (COLACE) 100 MG capsule Take 1 capsule (100 mg total) by mouth 2 (two) times daily. 10 capsule 0  . enoxaparin (LOVENOX) 40 MG/0.4ML injection Inject 0.4 mLs (40 mg total) into the skin daily for 14 days. 14 Syringe 0  . ferrous sulfate 325 (65 FE) MG tablet Take 1 tablet (325 mg total) by mouth 2 (two) times daily with a meal.  3  . HYDROcodone-acetaminophen (NORCO/VICODIN) 5-325 MG tablet Take 1-2 tablets by mouth every 4 (four) hours as needed for moderate pain or severe pain. 40 tablet 0  . methocarbamol (ROBAXIN) 500 MG tablet Take 1 tablet (500 mg total) by mouth every 6 (six) hours as needed for muscle spasms. 30 tablet 0  . torsemide (DEMADEX) 20 MG tablet Take 1 tablet (20 mg total) by mouth daily. 90 tablet 0     Discharge Medications: Please see discharge summary for a list of discharge medications.  Relevant Imaging  Results:  Relevant Lab Results:   Additional Information    Brenda Wu  Rinaldo Ratel, [address]

## 2018-05-19 NOTE — Progress Notes (Signed)
Discharge Progress Report  Patient Details  Name: Brenda Wu MRN: [id number] Date of Birth: [birth year] Referring Provider:     Pulmonary Rehab from 03/14/2018 in [facility]  Referring Provider  Gollan       Number of Visits: 13/36  Reason for Discharge:  Early Exit:  Broken Femur  Smoking History:  Social History   Tobacco Use  Smoking Status Former Smoker  . Packs/day: 1.00  . Years: 30.00  . Pack years: 30.00  . Types: Cigarettes  . Last attempt to quit: 02/22/2002  . Years since quitting: 16.2  Smokeless Tobacco Never Used    Diagnosis:  Heart failure, systolic and diastolic, chronic (HCC)  ADL [facility]: Pulmonary Assessment Scores    Row Name 03/14/18 1222         ADL [facility]SD   ADL Phase  Entry     SOB Score total  54     Rest  0     Walk  3     Stairs  5     Bath  2     Dress  1     Shop  4       CAT Score   CAT Score  12       mMRC Score   mMRC Score  3        Initial Exercise Prescription: Initial Exercise Prescription - 03/14/18 1300      Date of Initial Exercise RX and Referring Provider   Date  03/14/18    Referring Provider  Gollan      Treadmill   MPH  1.5    Grade  0    Minutes  15    METs  2.15      NuStep   Level  2    SPM  80    Minutes  15    METs  2      Biostep-RELP   Level  2    SPM  50    Minutes  15    METs  2      Prescription Details   Frequency (times per week)  3    Duration  Progress to 45 minutes of aerobic exercise without signs/symptoms of physical distress      Intensity   THRR 40-80% of Max Heartrate  114-144    Ratings of Perceived Exertion  11-15    Perceived Dyspnea  0-4      Resistance Training   Training Prescription  Yes    Weight  3 lb    Reps  10-15       Discharge Exercise Prescription (Final Exercise Prescription Changes): Exercise Prescription Changes - 05/16/18 1300      Response to Exercise   Blood Pressure (Admit)  128/64    Blood Pressure (Exit)   120/64    Heart Rate (Admit)  85 bpm    Heart Rate (Exercise)  115 bpm    Heart Rate (Exit)  84 bpm    Oxygen Saturation (Admit)  97 %    Oxygen Saturation (Exercise)  93 %    Oxygen Saturation (Exit)  95 %    Rating of Perceived Exertion (Exercise)  13    Perceived Dyspnea (Exercise)  2    Symptoms  none    Comments  returned on 05/01/18    Duration  Continue with 45 min of aerobic exercise without signs/symptoms of physical distress.    Intensity  THRR unchanged  Progression   Progression  Continue to progress workloads to maintain intensity without signs/symptoms of physical distress.    Average METs  2.54      Resistance Training   Training Prescription  Yes    Weight  3 lb    Reps  10-15      Interval Training   Interval Training  No      Oxygen   Oxygen  Continuous    Liters  2      Treadmill   MPH  2    Grade  1    Minutes  15    METs  2.81      NuStep   Level  4    Minutes  15    METs  1.8      Biostep-RELP   Level  4    Minutes  15    METs  3      Home Exercise Plan   Plans to continue exercise at  Home (comment)   walking, elliptical, bike   Frequency  Add 1 additional day to program exercise sessions.    Initial Home Exercises Provided  04/05/18       Functional Capacity: 6 Minute Walk    Row Name 03/14/18 1326         6 Minute Walk   Phase  Initial     Distance  765 feet     Walk Time  5.25 minutes     # of Rest Breaks  1     MPH  1.65     METS  2.2     RPE  17     Perceived Dyspnea   2.5     VO2 Peak  7.7     Symptoms  No     Resting HR  84 bpm     Resting BP  94/60     Resting Oxygen Saturation   96 %     Exercise Oxygen Saturation  during 6 min walk  83 %     Max Ex. HR  139 bpm     Max Ex. BP  120/64     2 Minute Post BP  116/58       Interval HR   1 Minute HR  84     3 Minute HR  116     4 Minute HR  131     5 Minute HR  129     6 Minute HR  139     2 Minute Post HR  96     Interval Heart Rate?  Yes        Interval Oxygen   Interval Oxygen?  Yes     Baseline Oxygen Saturation %  96 %     1 Minute Oxygen Saturation %  91 %     1 Minute Liters of Oxygen  2 L     2 Minute Liters of Oxygen  2 L     3 Minute Oxygen Saturation %  90 %     3 Minute Liters of Oxygen  2 L     4 Minute Oxygen Saturation %  87 %     4 Minute Liters of Oxygen  2 L     5 Minute Oxygen Saturation %  85 %     5 Minute Liters of Oxygen  2 L     6 Minute Oxygen Saturation %  83 %     6 Minute Liters  of Oxygen  2 L     2 Minute Post Oxygen Saturation %  93 %     2 Minute Post Liters of Oxygen  2 L        Psychological, QOL, Others - Outcomes: PHQ 2/9: Depression screen [facility] 2/9 05/03/2018 04/05/2018 03/20/2018 03/14/2018 12/12/2017  Decreased Interest 0 _0 0  Down, Depressed, Hopeless 0 0 2 1 0  PHQ - 2 Score 0 _1 0  Altered sleeping _2 -  Tired, decreased energy 1 0 3 3 -  Change in appetite 1 0 2 2 -  Feeling bad or failure about yourself  0 0 0 1 -  Trouble concentrating 0 _3 -  Moving slowly or fidgety/restless 0 0 0 0 -  Suicidal thoughts 0 0 0 0 -  PHQ-9 Score _4 -  Difficult doing work/chores Somewhat difficult Not difficult at all Somewhat difficult Somewhat difficult -    Quality of Life:   Personal Goals: Goals established at orientation with interventions provided to work toward goal. Personal Goals and Risk Factors at Admission - 03/14/18 1156      Core Components/Risk Factors/Patient Goals on Admission    Weight Management  Yes;Weight Loss;Obesity    Intervention  Weight Management: Develop a combined nutrition and exercise program designed to reach desired caloric intake, while maintaining appropriate intake of nutrient and fiber, sodium and fats, and appropriate energy expenditure required for the weight goal.;Weight Management: Provide education and appropriate resources to help participant work on and attain dietary goals.;Weight Management/Obesity: Establish reasonable short  term and long term weight goals.    Admit Weight  206 lb 12.8 oz (93.8 kg)    Goal Weight: Short Term  201 lb (91.2 kg)    Goal Weight: Long Term  160 lb (72.6 kg)    Expected Outcomes  Short Term: Continue to assess and modify interventions until short term weight is achieved;Long Term: Adherence to nutrition and physical activity/exercise program aimed toward attainment of established weight goal;Weight Maintenance: Understanding of the daily nutrition guidelines, which includes 25-35% calories from fat, 7% or less cal from saturated fats, less than 246m cholesterol, less than 1.5gm of sodium, & 5 or more servings of fruits and vegetables daily;Weight Loss: Understanding of general recommendations for a balanced deficit meal plan, which promotes 1-2 lb weight loss per week and includes a negative energy balance of [phone] kcal/d    Improve shortness of breath with ADL's  Yes    Intervention  Provide education, individualized exercise plan and daily activity instruction to help decrease symptoms of SOB with activities of daily living.    Expected Outcomes  Short Term: Improve cardiorespiratory fitness to achieve a reduction of symptoms when performing ADLs;Long Term: Be able to perform more ADLs without symptoms or delay the onset of symptoms    Diabetes  Yes    Intervention  Provide education about signs/symptoms and action to take for hypo/hyperglycemia.;Provide education about proper nutrition, including hydration, and aerobic/resistive exercise prescription along with prescribed medications to achieve blood glucose in normal ranges: Fasting glucose 65-99 mg/dL    Expected Outcomes  Short Term: Participant verbalizes understanding of the signs/symptoms and immediate care of hyper/hypoglycemia, proper foot care and importance of medication, aerobic/resistive exercise and nutrition plan for blood glucose control.;Long Term: Attainment of HbA1C < 7%.    Heart Failure  Yes    Intervention  Provide a  combined exercise and nutrition  program that is supplemented with education, support and counseling about heart failure. Directed toward relieving symptoms such as shortness of breath, decreased exercise tolerance, and extremity edema.    Expected Outcomes  Improve functional capacity of life;Short term: Attendance in program 2-3 days a week with increased exercise capacity. Reported lower sodium intake. Reported increased fruit and vegetable intake. Reports medication compliance.;Short term: Daily weights obtained and reported for increase. Utilizing diuretic protocols set by physician.;Long term: Adoption of self-care skills and reduction of barriers for early signs and symptoms recognition and intervention leading to self-care maintenance.    Hypertension  Yes    Intervention  Provide education on lifestyle modifcations including regular physical activity/exercise, weight management, moderate sodium restriction and increased consumption of fresh fruit, vegetables, and low fat dairy, alcohol moderation, and smoking cessation.;Monitor prescription use compliance.    Expected Outcomes  Short Term: Continued assessment and intervention until BP is < 140/43m HG in hypertensive participants. < 130/824mHG in hypertensive participants with diabetes, heart failure or chronic kidney disease.;Long Term: Maintenance of blood pressure at goal levels.    Lipids  Yes    Intervention  Provide education and support for participant on nutrition & aerobic/resistive exercise along with prescribed medications to achieve LDL <7025mHDL >85m43m  Expected Outcomes  Short Term: Participant states understanding of desired cholesterol values and is compliant with medications prescribed. Participant is following exercise prescription and nutrition guidelines.;Long Term: Cholesterol controlled with medications as prescribed, with individualized exercise RX and with personalized nutrition plan. Value goals: LDL < 70mg29mL > 40 mg.         Personal Goals Discharge: Goals and Risk Factor Review    Row Name 05/11/18 1352             Core Components/Risk Factors/Patient Goals Review   Personal Goals Review  Weight Management/Obesity;Improve shortness of breath with ADL's;Lipids;Hypertension;Diabetes       Review  Patient has lost some weight since the start of the program. She states she her weight was 199 this morning. She is checking her blood pressure at home and was 124/74 this morning. Her oxygen was 96 percent with a HR or 88. She is checking her sugar at home and was 99 fasting. She is trying to stay busy while being confined to home.       Expected Outcomes  Short: Exercise and take medications as prescribed to help with overall health. Inform staff of any changes while [facility] is closed. Long: maintain exercise and medications independently to improve upon overall health          Exercise Goals and Review: Exercise Goals    Row Name 03/14/18 1325             Exercise Goals   Increase Physical Activity  Yes       Intervention  Provide advice, education, support and counseling about physical activity/exercise needs.;Develop an individualized exercise prescription for aerobic and resistive training based on initial evaluation findings, risk stratification, comorbidities and participant's personal goals.       Expected Outcomes  Short Term: Attend rehab on a regular basis to increase amount of physical activity.;Long Term: Add in home exercise to make exercise part of routine and to increase amount of physical activity.;Long Term: Exercising regularly at least 3-5 days a week.       Increase Strength and Stamina  Yes       Intervention  Provide advice, education, support and counseling about physical activity/exercise needs.;Develop  an individualized exercise prescription for aerobic and resistive training based on initial evaluation findings, risk stratification, comorbidities and participant's personal  goals.       Expected Outcomes  Short Term: Increase workloads from initial exercise prescription for resistance, speed, and METs.;Short Term: Perform resistance training exercises routinely during rehab and add in resistance training at home;Long Term: Improve cardiorespiratory fitness, muscular endurance and strength as measured by increased METs and functional capacity (6MWT)       Able to understand and use rate of perceived exertion (RPE) scale  Yes       Intervention  Provide education and explanation on how to use RPE scale       Expected Outcomes  Short Term: Able to use RPE daily in rehab to express subjective intensity level;Long Term:  Able to use RPE to guide intensity level when exercising independently       Able to understand and use Dyspnea scale  Yes       Intervention  Provide education and explanation on how to use Dyspnea scale       Expected Outcomes  Short Term: Able to use Dyspnea scale daily in rehab to express subjective sense of shortness of breath during exertion;Long Term: Able to use Dyspnea scale to guide intensity level when exercising independently       Knowledge and understanding of Target Heart Rate Range (THRR)  Yes       Intervention  Provide education and explanation of THRR including how the numbers were predicted and where they are located for reference       Expected Outcomes  Short Term: Able to state/look up THRR;Short Term: Able to use daily as guideline for intensity in rehab;Long Term: Able to use THRR to govern intensity when exercising independently       Able to check pulse independently  Yes       Intervention  Provide education and demonstration on how to check pulse in carotid and radial arteries.;Review the importance of being able to check your own pulse for safety during independent exercise       Expected Outcomes  Short Term: Able to explain why pulse checking is important during independent exercise;Long Term: Able to check pulse independently  and accurately       Understanding of Exercise Prescription  Yes       Intervention  Provide education, explanation, and written materials on patient's individual exercise prescription       Expected Outcomes  Short Term: Able to explain program exercise prescription;Long Term: Able to explain home exercise prescription to exercise independently          Exercise Goals Re-Evaluation: Exercise Goals Re-Evaluation    Row Name 03/20/18 1023 03/23/18 0846 04/05/18 1040 04/19/18 1240 05/02/18 0920     Exercise Goal Re-Evaluation   Exercise Goals Review  Increase Physical Activity;Increase Strength and Stamina;Able to understand and use rate of perceived exertion (RPE) scale;Knowledge and understanding of Target Heart Rate Range (THRR);Understanding of Exercise Prescription;Able to understand and use Dyspnea scale  Increase Physical Activity;Increase Strength and Stamina;Able to understand and use rate of perceived exertion (RPE) scale;Able to understand and use Dyspnea scale;Knowledge and understanding of Target Heart Rate Range (THRR)  Increase Physical Activity;Able to understand and use rate of perceived exertion (RPE) scale;Knowledge and understanding of Target Heart Rate Range (THRR);Understanding of Exercise Prescription;Increase Strength and Stamina;Able to check pulse independently;Able to understand and use Dyspnea scale  Increase Physical Activity;Increase Strength and Stamina;Able to understand and  use rate of perceived exertion (RPE) scale;Able to understand and use Dyspnea scale;Knowledge and understanding of Target Heart Rate Range (THRR);Understanding of Exercise Prescription  Increase Physical Activity;Increase Strength and Stamina;Able to understand and use rate of perceived exertion (RPE) scale;Able to understand and use Dyspnea scale;Able to check pulse independently;Understanding of Exercise Prescription   Comments  Reviewed RPE scale, THR and program prescription with pt today.  Pt  voiced understanding and was given a copy of goals to take home.   Chrisha has tolerated exercise well and increased speed and grade on TM.  Staff will monitor progress.  Reviewed home exercise with pt today.  Pt plans to walk and use elliptical and bike at home for exercise.  Reviewed THR, pulse, RPE, sign and symptoms, and when to call 911 or MD.  Also discussed weather considerations and indoor options.  Pt voiced understanding.  Mona has improved overall MET level.  She requested change from NS to XR for more challenge. Staff will monitor progress.  Kaeli has just come back after missing several sessions due to illness.  She is working back up to previous levels.    Expected Outcomes  Short: Use RPE daily to regulate intensity. Long: Follow program prescription in THR.  Short - attend LW consistently Long - increase MET level  Short: Start to add in exercise at home.  Long: Continue to increase physical activity  Short - attend consistently Long - increase MET level  Short - build up to previous level Long - increase MET level   Row Name 05/11/18 1348             Exercise Goal Re-Evaluation   Exercise Goals Review  Increase Physical Activity;Increase Strength and Stamina;Understanding of Exercise Prescription       Comments  Informed patient of the Exercise videos sent out via email. Patient states that she has a treadmill and bike that she can use while she is at home. She understands that there are videos to watch and understands how to keep her exercise going.       Expected Outcomes  Short: maintain exercise by using home equipment and watching videos. Long: maintian independance with exercise.          Nutrition & Weight - Outcomes: Pre Biometrics - 03/14/18 1323      Pre Biometrics   Height  5' 3.25" (1.607 m)    Weight  206 lb 12.8 oz (93.8 kg)    Waist Circumference  40.5 inches    Hip Circumference  49 inches    Waist to Hip Ratio  0.83 %    BMI (Calculated)  36.32    Single  Leg Stand  2.5 seconds        Nutrition: Nutrition Therapy & Goals - 03/14/18 1153      Personal Nutrition Goals   Nutrition Goal  weight loss    Personal Goal #2  Meet with the dietician      Intervention Plan   Intervention  Prescribe, educate and counsel regarding individualized specific dietary modifications aiming towards targeted core components such as weight, hypertension, lipid management, diabetes, heart failure and other comorbidities.;Nutrition handout(s) given to patient.    Expected Outcomes  Short Term Goal: Understand basic principles of dietary content, such as calories, fat, sodium, cholesterol and nutrients.;Long Term Goal: Adherence to prescribed nutrition plan.       Nutrition Discharge: Nutrition Assessments - 03/14/18 1225      MEDFICTS Scores   Pre Score  6       Education Questionnaire Score: Knowledge Questionnaire Score - 03/14/18 1155      Knowledge Questionnaire Score   Pre Score  15/18   reviewed with patient      Goals reviewed with patient; copy given to patient.

## 2018-05-19 NOTE — Telephone Encounter (Signed)
Brenda Wu sent an email to let us know that she fell and broke her femur.  Called pt to check in.  She will be getting home health PT for now.  We will discharge her at this time while she recovers.

## 2018-05-19 NOTE — Progress Notes (Signed)
Pulmonary Individual Treatment Plan  Patient Details  Name: Brenda Wu MRN: [id number] Date of Birth: May 16, 1955 Referring Provider:     Pulmonary Rehab from 03/14/2018 in [facility] Cardiac and Pulmonary Rehab  Referring Provider  Gollan      Initial Encounter Date:    Pulmonary Rehab from 03/14/2018 in [facility] Cardiac and Pulmonary Rehab  Date  03/14/18      Visit Diagnosis: Heart failure, systolic and diastolic, chronic ([place])  Patient's Home Medications on Admission:  Current Outpatient Medications:  .  albuterol (PROVENTIL HFA;VENTOLIN HFA) 108 (90 Base) MCG/ACT inhaler, Inhale 2 puffs into the lungs 4 (four) times daily as needed for wheezing or shortness of breath. , Disp: , Rfl:  .  albuterol (PROVENTIL) (2.5 MG/3ML) 0.083% nebulizer solution, Inhale 2.5 mg into the lungs every 6 (six) hours as needed for wheezing or shortness of breath. , Disp: , Rfl:  .  aspirin EC 81 MG tablet, Take 1 tablet (81 mg total) by mouth daily., Disp: 32 tablet, Rfl: 0 .  atorvastatin (LIPITOR) 40 MG tablet, Take 40 mg by mouth every evening. , Disp: , Rfl:  .  budesonide-formoterol (SYMBICORT) 160-4.5 MCG/ACT inhaler, Inhale 2 puffs into the lungs 2 (two) times daily., Disp: , Rfl:  .  diltiazem (CARDIZEM CD) 180 MG 24 hr capsule, Take 180 mg by mouth daily., Disp: , Rfl:  .  docusate sodium (COLACE) 100 MG capsule, Take 1 capsule (100 mg total) by mouth 2 (two) times daily., Disp: 10 capsule, Rfl: 0 .  enoxaparin (LOVENOX) 40 MG/0.4ML injection, Inject 0.4 mLs (40 mg total) into the skin daily for 14 days., Disp: 14 Syringe, Rfl: 0 .  ferrous sulfate 325 (65 FE) MG tablet, Take 1 tablet (325 mg total) by mouth 2 (two) times daily with a meal., Disp: , Rfl: 3 .  glipiZIDE (GLUCOTROL) 10 MG tablet, Take 10 mg by mouth 2 (two) times daily before a meal. , Disp: , Rfl:  .  guaiFENesin (MUCINEX) 600 MG 12 hr tablet, Take 600 mg by mouth 2 (two) times daily as needed for cough. , Disp: , Rfl:  .   HYDROcodone-acetaminophen (NORCO/VICODIN) 5-325 MG tablet, Take 1-2 tablets by mouth every 4 (four) hours as needed for moderate pain or severe pain., Disp: 40 tablet, Rfl: 0 .  lamoTRIgine (LAMICTAL) 200 MG tablet, Take 200 mg by mouth 2 (two) times daily. , Disp: , Rfl:  .  methocarbamol (ROBAXIN) 500 MG tablet, Take 1 tablet (500 mg total) by mouth every 6 (six) hours as needed for muscle spasms., Disp: 30 tablet, Rfl: 0 .  Multiple Vitamin (MULTI-VITAMINS) TABS, Take 1 tablet by mouth daily., Disp: , Rfl:  .  niacin 500 MG tablet, Take 500 mg by mouth daily., Disp: , Rfl:  .  potassium chloride SA (K-DUR,KLOR-CON) 20 MEQ tablet, Take 1 tablet (20 mEq total) by mouth daily., Disp: 30 tablet, Rfl: 2 .  pregabalin (LYRICA) 75 MG capsule, Take 75 mg by mouth 2 (two) times daily., Disp: , Rfl:  .  senna (SENOKOT) 8.6 MG TABS tablet, Take 1 tablet by mouth daily. , Disp: , Rfl:  .  tiotropium (SPIRIVA) 18 MCG inhalation capsule, Place 18 mcg into inhaler and inhale daily., Disp: , Rfl:  .  topiramate (TOPAMAX) 200 MG tablet, Take 200 mg by mouth 2 (two) times daily., Disp: , Rfl:  .  torsemide (DEMADEX) 20 MG tablet, Take 1 tablet (20 mg total) by mouth daily., Disp: 90 tablet, Rfl: 0 .  vitamin B-12 (CYANOCOBALAMIN) 500 MCG tablet, Take 500 mcg by mouth daily., Disp: , Rfl:   Past Medical History: Past Medical History:  Diagnosis Date  . Chronic combined systolic (congestive) and diastolic (congestive) heart failure ([place])    a. 10/2017 Echo: EF 45-50%, diff HK. Mild to mod MR, mildly dil LA. Mod dil RV w/ mildly reduced RV fxn.  [name] COPD (chronic obstructive pulmonary disease) ([place])   . Depression   . Diabetes mellitus without complication ([place])   . Hypercholesteremia   . Hypertension   . Morbid obesity ([place])   . NICM (nonischemic cardiomyopathy) ([place])   . NSTEMI (non-ST elevated myocardial infarction) ([place])    a. 12/2017 -->Cath: nl cors. EF 45-50%.  . Seizures ([place])   . Stroke ([place])   .  Tricuspid regurgitation    a. 10/2017 Echo: Mild to mod TR.    Tobacco Use: Social History   Tobacco Use  Smoking Status Former Smoker  . Packs/day: 1.00  . Years: 30.00  . Pack years: 30.00  . Types: Cigarettes  . Last attempt to quit: 02/22/2002  . Years since quitting: 16.2  Smokeless Tobacco Never Used    Labs: Recent Chemical engineer    Labs for ITP Cardiac and Pulmonary Rehab Latest Ref Rng & Units 01/05/2018 01/05/2018 01/05/2018 01/06/2018 05/13/2018   Hemoglobin A1c 4.8 - 5.6 % - - 7.4(H) - 9.0(H)   PHART 7.350 - 7.450 - 7.24(L) - 7.32(L) -   PCO2ART 32.0 - 48.0 mmHg - 84(HH) - 65(H) -   HCO3 20.0 - 28.0 mmol/L 34.4(H) 35.2(H) - 33.5(H) -   O2SAT % 99.2 90.2 - 81.6 -       Pulmonary Assessment Scores: Pulmonary Assessment Scores    Row Name 03/14/18 1222         ADL UCSD   ADL Phase  Entry     SOB Score total  54     Rest  0     Walk  3     Stairs  5     Bath  2     Dress  1     Shop  4       CAT Score   CAT Score  12       mMRC Score   mMRC Score  3        Pulmonary Function Assessment: Pulmonary Function Assessment - 03/14/18 1150      Breath   Bilateral Breath Sounds  Clear    Shortness of Breath  Yes;Fear of Shortness of Breath;Limiting activity;Panic with Shortness of Breath       Exercise Target Goals: Exercise Program Goal: Individual exercise prescription set using results from initial 6 min walk test and THRR while considering  patient's activity barriers and safety.   Exercise Prescription Goal: Initial exercise prescription builds to 30-45 minutes a day of aerobic activity, 2-3 days per week.  Home exercise guidelines will be given to patient during program as part of exercise prescription that the participant will acknowledge.  Activity Barriers & Risk Stratification:   6 Minute Walk: 6 Minute Walk    Row Name 03/14/18 1326         6 Minute Walk   Phase  Initial     Distance  765 feet     Walk Time  5.25 minutes      # of Rest Breaks  1     MPH  1.65     METS  2.2  RPE  17     Perceived Dyspnea   2.5     VO2 Peak  7.7     Symptoms  No     Resting HR  84 bpm     Resting BP  94/60     Resting Oxygen Saturation   96 %     Exercise Oxygen Saturation  during 6 min walk  83 %     Max Ex. HR  139 bpm     Max Ex. BP  120/64     2 Minute Post BP  116/58       Interval HR   1 Minute HR  84     3 Minute HR  116     4 Minute HR  131     5 Minute HR  129     6 Minute HR  139     2 Minute Post HR  96     Interval Heart Rate?  Yes       Interval Oxygen   Interval Oxygen?  Yes     Baseline Oxygen Saturation %  96 %     1 Minute Oxygen Saturation %  91 %     1 Minute Liters of Oxygen  2 L     2 Minute Liters of Oxygen  2 L     3 Minute Oxygen Saturation %  90 %     3 Minute Liters of Oxygen  2 L     4 Minute Oxygen Saturation %  87 %     4 Minute Liters of Oxygen  2 L     5 Minute Oxygen Saturation %  85 %     5 Minute Liters of Oxygen  2 L     6 Minute Oxygen Saturation %  83 %     6 Minute Liters of Oxygen  2 L     2 Minute Post Oxygen Saturation %  93 %     2 Minute Post Liters of Oxygen  2 L       Oxygen Initial Assessment: Oxygen Initial Assessment - 03/14/18 1149      Home Oxygen   Home Oxygen Device  Home Concentrator;E-Tanks    Sleep Oxygen Prescription  Continuous;BiPAP    Liters per minute  2    Home Exercise Oxygen Prescription  Continuous    Liters per minute  2    Home at Rest Exercise Oxygen Prescription  Continuous    Liters per minute  2    Compliance with Home Oxygen Use  Yes      Initial 6 min Walk   Oxygen Used  Continuous;E-Tanks    Liters per minute  2      Program Oxygen Prescription   Program Oxygen Prescription  Continuous;E-Tanks    Liters per minute  2      Intervention   Short Term Goals  To learn and exhibit compliance with exercise, home and travel O2 prescription;To learn and understand importance of monitoring SPO2 with pulse oximeter and  demonstrate accurate use of the pulse oximeter.;To learn and understand importance of maintaining oxygen saturations>88%;To learn and demonstrate proper pursed lip breathing techniques or other breathing techniques.;To learn and demonstrate proper use of respiratory medications    Long  Term Goals  Exhibits compliance with exercise, home and travel O2 prescription;Verbalizes importance of monitoring SPO2 with pulse oximeter and return demonstration;Maintenance of O2 saturations>88%;Exhibits proper breathing techniques, such as pursed lip breathing or  other method taught during program session;Compliance with respiratory medication;Demonstrates proper use of MDI's       Oxygen Re-Evaluation: Oxygen Re-Evaluation    Row Name 03/20/18 1025 05/03/18 1402           Program Oxygen Prescription   Program Oxygen Prescription  Continuous;E-Tanks  Continuous;E-Tanks      Liters per minute  2  2        Home Oxygen   Home Oxygen Device  Home Concentrator;E-Tanks  Home Concentrator;E-Tanks      Sleep Oxygen Prescription  Continuous;BiPAP  Continuous;BiPAP      Liters per minute  2  2      Home Exercise Oxygen Prescription  Continuous  Continuous      Liters per minute  2  2      Home at Rest Exercise Oxygen Prescription  Continuous  Continuous      Liters per minute  2  2      Compliance with Home Oxygen Use  Yes  -        Goals/Expected Outcomes   Short Term Goals  To learn and exhibit compliance with exercise, home and travel O2 prescription;To learn and understand importance of monitoring SPO2 with pulse oximeter and demonstrate accurate use of the pulse oximeter.;To learn and understand importance of maintaining oxygen saturations>88%;To learn and demonstrate proper pursed lip breathing techniques or other breathing techniques.;To learn and demonstrate proper use of respiratory medications  To learn and exhibit compliance with exercise, home and travel O2 prescription;To learn and understand  importance of monitoring SPO2 with pulse oximeter and demonstrate accurate use of the pulse oximeter.;To learn and understand importance of maintaining oxygen saturations>88%;To learn and demonstrate proper pursed lip breathing techniques or other breathing techniques.;To learn and demonstrate proper use of respiratory medications      Long  Term Goals  Exhibits compliance with exercise, home and travel O2 prescription;Verbalizes importance of monitoring SPO2 with pulse oximeter and return demonstration;Maintenance of O2 saturations>88%;Exhibits proper breathing techniques, such as pursed lip breathing or other method taught during program session;Compliance with respiratory medication;Demonstrates proper use of MDI's  Exhibits compliance with exercise, home and travel O2 prescription;Verbalizes importance of monitoring SPO2 with pulse oximeter and return demonstration;Maintenance of O2 saturations>88%;Exhibits proper breathing techniques, such as pursed lip breathing or other method taught during program session;Compliance with respiratory medication;Demonstrates proper use of MDI's      Comments  Reviewed PLB technique with pt.  Talked about how it work and it's important to maintaining his exercise saturations.    Informed patient about how to use, when to use and gave direction on how to improve their respiratory function. Patient performed about 10 repetitions with the device with no respiratory distress. Patient was sent home with the device and instructions. Patient verbalizes understanding of the device.      Goals/Expected Outcomes  Short: Become more profiecient at using PLB.   Long: Become independent at using PLB.  Short: use inspiratory trainer daily to improve lung function. Long: maintain use of inspiratory trainer independently.         Oxygen Discharge (Final Oxygen Re-Evaluation): Oxygen Re-Evaluation - 05/03/18 1402      Program Oxygen Prescription   Program Oxygen Prescription   Continuous;E-Tanks    Liters per minute  2      Home Oxygen   Home Oxygen Device  Home Concentrator;E-Tanks    Sleep Oxygen Prescription  Continuous;BiPAP    Liters per minute  2    Home  Exercise Oxygen Prescription  Continuous    Liters per minute  2    Home at Rest Exercise Oxygen Prescription  Continuous    Liters per minute  2      Goals/Expected Outcomes   Short Term Goals  To learn and exhibit compliance with exercise, home and travel O2 prescription;To learn and understand importance of monitoring SPO2 with pulse oximeter and demonstrate accurate use of the pulse oximeter.;To learn and understand importance of maintaining oxygen saturations>88%;To learn and demonstrate proper pursed lip breathing techniques or other breathing techniques.;To learn and demonstrate proper use of respiratory medications    Long  Term Goals  Exhibits compliance with exercise, home and travel O2 prescription;Verbalizes importance of monitoring SPO2 with pulse oximeter and return demonstration;Maintenance of O2 saturations>88%;Exhibits proper breathing techniques, such as pursed lip breathing or other method taught during program session;Compliance with respiratory medication;Demonstrates proper use of MDI's    Comments  Informed patient about how to use, when to use and gave direction on how to improve their respiratory function. Patient performed about 10 repetitions with the device with no respiratory distress. Patient was sent home with the device and instructions. Patient verbalizes understanding of the device.    Goals/Expected Outcomes  Short: use inspiratory trainer daily to improve lung function. Long: maintain use of inspiratory trainer independently.       Initial Exercise Prescription: Initial Exercise Prescription - 03/14/18 1300      Date of Initial Exercise RX and Referring Provider   Date  03/14/18    Referring Provider  Gollan      Treadmill   MPH  1.5    Grade  0    Minutes  15     METs  2.15      NuStep   Level  2    SPM  80    Minutes  15    METs  2      Biostep-RELP   Level  2    SPM  50    Minutes  15    METs  2      Prescription Details   Frequency (times per week)  3    Duration  Progress to 45 minutes of aerobic exercise without signs/symptoms of physical distress      Intensity   THRR 40-80% of Max Heartrate  114-144    Ratings of Perceived Exertion  11-15    Perceived Dyspnea  0-4      Resistance Training   Training Prescription  Yes    Weight  3 lb    Reps  10-15       Perform Capillary Blood Glucose checks as needed.  Exercise Prescription Changes: Exercise Prescription Changes    Row Name 03/23/18 0800 04/05/18 1000 04/05/18 1400 04/19/18 1200 05/02/18 0900     Response to Exercise   Blood Pressure (Admit)  118/66  -  134/62  126/72  98/62   Blood Pressure (Exercise)  124/62  -  -  -  138/72   Blood Pressure (Exit)  132/64  -  132/60  124/62  108/62   Heart Rate (Admit)  95 bpm  -  88 bpm  95 bpm  84 bpm   Heart Rate (Exercise)  123 bpm  -  100 bpm  110 bpm  104 bpm   Heart Rate (Exit)  91 bpm  -  90 bpm  84 bpm  82 bpm   Oxygen Saturation (Admit)  92 %  -  97 %  90 %  93 %   Oxygen Saturation (Exercise)  89 %  -  91 %  90 %  94 %   Oxygen Saturation (Exit)  95 %  -  95 %  95 %  98 %   Rating of Perceived Exertion (Exercise)  13  -  _0 Perceived Dyspnea (Exercise)  2  -  0  2  2   Symptoms  none  -  none  none  none   Comments  second day exercise  -  -  -  out sick since 2/19   Duration  Progress to 45 minutes of aerobic exercise without signs/symptoms of physical distress  -  Continue with 45 min of aerobic exercise without signs/symptoms of physical distress.  Continue with 45 min of aerobic exercise without signs/symptoms of physical distress.  Continue with 45 min of aerobic exercise without signs/symptoms of physical distress.   Intensity  THRR unchanged  -  THRR unchanged  THRR unchanged  THRR unchanged      Progression   Progression  Continue to progress workloads to maintain intensity without signs/symptoms of physical distress.  -  Continue to progress workloads to maintain intensity without signs/symptoms of physical distress.  Continue to progress workloads to maintain intensity without signs/symptoms of physical distress.  Continue to progress workloads to maintain intensity without signs/symptoms of physical distress.   Average METs  2.4  -  2.42  2.9  2.2     Resistance Training   Training Prescription  Yes  -  Yes  Yes  Yes   Weight  3 lb  -  3 lbs  3 lb  3 lb   Reps  10-15  -  10-15  10-15  10-15     Interval Training   Interval Training  -  -  No  No  No     Oxygen   Oxygen  -  -  Continuous  Continuous  Continuous   Liters  -  -  _1 Treadmill   MPH  2  -  2  2  1.5   Grade  0.5  -  0.5  1  0   Minutes  15  -  _2 METs  2.67  -  2.67  2.81  -     NuStep   Level  2  -  2  -  4   SPM  80  -  -  -  -   Minutes  15  -  15  -  15   METs  2.1  -  2.6  -  1.8     REL-XR   Level  -  -  -  5  -   Minutes  -  -  -  15  -   METs  -  -  -  4.3  -     Biostep-RELP   Level  -  -  _3 SPM  -  -  -  50  50   Minutes  -  -  _4 METs  -  -  _5 Home Exercise Plan   Plans to continue exercise at  -  Home (comment) walking, elliptical, bike  Home (  comment) walking, elliptical, bike  Home (comment) walking, elliptical, bike  Home (comment) walking, elliptical, bike   Frequency  -  Add 1 additional day to program exercise sessions.  Add 1 additional day to program exercise sessions.  Add 1 additional day to program exercise sessions.  Add 1 additional day to program exercise sessions.   Initial Home Exercises Provided  -  04/05/18  04/05/18  04/05/18  04/05/18   Row Name 05/16/18 1300             Response to Exercise   Blood Pressure (Admit)  128/64       Blood Pressure (Exit)  120/64       Heart Rate (Admit)  85 bpm       Heart Rate  (Exercise)  115 bpm       Heart Rate (Exit)  84 bpm       Oxygen Saturation (Admit)  97 %       Oxygen Saturation (Exercise)  93 %       Oxygen Saturation (Exit)  95 %       Rating of Perceived Exertion (Exercise)  13       Perceived Dyspnea (Exercise)  2       Symptoms  none       Comments  returned on 05/01/18       Duration  Continue with 45 min of aerobic exercise without signs/symptoms of physical distress.       Intensity  THRR unchanged         Progression   Progression  Continue to progress workloads to maintain intensity without signs/symptoms of physical distress.       Average METs  2.54         Resistance Training   Training Prescription  Yes       Weight  3 lb       Reps  10-15         Interval Training   Interval Training  No         Oxygen   Oxygen  Continuous       Liters  2         Treadmill   MPH  2       Grade  1       Minutes  15       METs  2.81         NuStep   Level  4       Minutes  15       METs  1.8         Biostep-RELP   Level  4       Minutes  15       METs  3         Home Exercise Plan   Plans to continue exercise at  Home (comment) walking, elliptical, bike       Frequency  Add 1 additional day to program exercise sessions.       Initial Home Exercises Provided  04/05/18          Exercise Comments: Exercise Comments    Row Name 03/20/18 1023           Exercise Comments   First full day of exercise!  Patient was oriented to gym and equipment including functions, settings, policies, and procedures.  Patient's individual exercise prescription and treatment plan were reviewed.  All starting workloads were established based on the results of the  6 minute walk test done at initial orientation visit.  The plan for exercise progression was also introduced and progression will be customized based on patient's performance and goals.          Exercise Goals and Review: Exercise Goals    Row Name 03/14/18 1325             Exercise  Goals   Increase Physical Activity  Yes       Intervention  Provide advice, education, support and counseling about physical activity/exercise needs.;Develop an individualized exercise prescription for aerobic and resistive training based on initial evaluation findings, risk stratification, comorbidities and participant's personal goals.       Expected Outcomes  Short Term: Attend rehab on a regular basis to increase amount of physical activity.;Long Term: Add in home exercise to make exercise part of routine and to increase amount of physical activity.;Long Term: Exercising regularly at least 3-5 days a week.       Increase Strength and Stamina  Yes       Intervention  Provide advice, education, support and counseling about physical activity/exercise needs.;Develop an individualized exercise prescription for aerobic and resistive training based on initial evaluation findings, risk stratification, comorbidities and participant's personal goals.       Expected Outcomes  Short Term: Increase workloads from initial exercise prescription for resistance, speed, and METs.;Short Term: Perform resistance training exercises routinely during rehab and add in resistance training at home;Long Term: Improve cardiorespiratory fitness, muscular endurance and strength as measured by increased METs and functional capacity (6MWT)       Able to understand and use rate of perceived exertion (RPE) scale  Yes       Intervention  Provide education and explanation on how to use RPE scale       Expected Outcomes  Short Term: Able to use RPE daily in rehab to express subjective intensity level;Long Term:  Able to use RPE to guide intensity level when exercising independently       Able to understand and use Dyspnea scale  Yes       Intervention  Provide education and explanation on how to use Dyspnea scale       Expected Outcomes  Short Term: Able to use Dyspnea scale daily in rehab to express subjective sense of shortness of  breath during exertion;Long Term: Able to use Dyspnea scale to guide intensity level when exercising independently       Knowledge and understanding of Target Heart Rate Range (THRR)  Yes       Intervention  Provide education and explanation of THRR including how the numbers were predicted and where they are located for reference       Expected Outcomes  Short Term: Able to state/look up THRR;Short Term: Able to use daily as guideline for intensity in rehab;Long Term: Able to use THRR to govern intensity when exercising independently       Able to check pulse independently  Yes       Intervention  Provide education and demonstration on how to check pulse in carotid and radial arteries.;Review the importance of being able to check your own pulse for safety during independent exercise       Expected Outcomes  Short Term: Able to explain why pulse checking is important during independent exercise;Long Term: Able to check pulse independently and accurately       Understanding of Exercise Prescription  Yes       Intervention  Provide education,  explanation, and written materials on patient's individual exercise prescription       Expected Outcomes  Short Term: Able to explain program exercise prescription;Long Term: Able to explain home exercise prescription to exercise independently          Exercise Goals Re-Evaluation : Exercise Goals Re-Evaluation    Row Name 03/20/18 1023 03/23/18 0846 04/05/18 1040 04/19/18 1240 05/02/18 0920     Exercise Goal Re-Evaluation   Exercise Goals Review  Increase Physical Activity;Increase Strength and Stamina;Able to understand and use rate of perceived exertion (RPE) scale;Knowledge and understanding of Target Heart Rate Range (THRR);Understanding of Exercise Prescription;Able to understand and use Dyspnea scale  Increase Physical Activity;Increase Strength and Stamina;Able to understand and use rate of perceived exertion (RPE) scale;Able to understand and use Dyspnea  scale;Knowledge and understanding of Target Heart Rate Range (THRR)  Increase Physical Activity;Able to understand and use rate of perceived exertion (RPE) scale;Knowledge and understanding of Target Heart Rate Range (THRR);Understanding of Exercise Prescription;Increase Strength and Stamina;Able to check pulse independently;Able to understand and use Dyspnea scale  Increase Physical Activity;Increase Strength and Stamina;Able to understand and use rate of perceived exertion (RPE) scale;Able to understand and use Dyspnea scale;Knowledge and understanding of Target Heart Rate Range (THRR);Understanding of Exercise Prescription  Increase Physical Activity;Increase Strength and Stamina;Able to understand and use rate of perceived exertion (RPE) scale;Able to understand and use Dyspnea scale;Able to check pulse independently;Understanding of Exercise Prescription   Comments  Reviewed RPE scale, THR and program prescription with pt today.  Pt voiced understanding and was given a copy of goals to take home.   Shian has tolerated exercise well and increased speed and grade on TM.  Staff will monitor progress.  Reviewed home exercise with pt today.  Pt plans to walk and use elliptical and bike at home for exercise.  Reviewed THR, pulse, RPE, sign and symptoms, and when to call 911 or MD.  Also discussed weather considerations and indoor options.  Pt voiced understanding.  Alea has improved overall MET level.  She requested change from NS to XR for more challenge. Staff will monitor progress.  Aqueelah has just come back after missing several sessions due to illness.  She is working back up to previous levels.    Expected Outcomes  Short: Use RPE daily to regulate intensity. Long: Follow program prescription in THR.  Short - attend LW consistently Long - increase MET level  Short: Start to add in exercise at home.  Long: Continue to increase physical activity  Short - attend consistently Long - increase MET level  Short -  build up to previous level Long - increase MET level   Row Name 05/11/18 1348             Exercise Goal Re-Evaluation   Exercise Goals Review  Increase Physical Activity;Increase Strength and Stamina;Understanding of Exercise Prescription       Comments  Informed patient of the Exercise videos sent out via email. Patient states that she has a treadmill and bike that she can use while she is at home. She understands that there are videos to watch and understands how to keep her exercise going.       Expected Outcomes  Short: maintain exercise by using home equipment and watching videos. Long: maintian independance with exercise.          Discharge Exercise Prescription (Final Exercise Prescription Changes): Exercise Prescription Changes - 05/16/18 1300      Response to Exercise  Blood Pressure (Admit)  128/64    Blood Pressure (Exit)  120/64    Heart Rate (Admit)  85 bpm    Heart Rate (Exercise)  115 bpm    Heart Rate (Exit)  84 bpm    Oxygen Saturation (Admit)  97 %    Oxygen Saturation (Exercise)  93 %    Oxygen Saturation (Exit)  95 %    Rating of Perceived Exertion (Exercise)  13    Perceived Dyspnea (Exercise)  2    Symptoms  none    Comments  returned on 05/01/18    Duration  Continue with 45 min of aerobic exercise without signs/symptoms of physical distress.    Intensity  THRR unchanged      Progression   Progression  Continue to progress workloads to maintain intensity without signs/symptoms of physical distress.    Average METs  2.54      Resistance Training   Training Prescription  Yes    Weight  3 lb    Reps  10-15      Interval Training   Interval Training  No      Oxygen   Oxygen  Continuous    Liters  2      Treadmill   MPH  2    Grade  1    Minutes  15    METs  2.81      NuStep   Level  4    Minutes  15    METs  1.8      Biostep-RELP   Level  4    Minutes  15    METs  3      Home Exercise Plan   Plans to continue exercise at  Home  (comment)   walking, elliptical, bike   Frequency  Add 1 additional day to program exercise sessions.    Initial Home Exercises Provided  04/05/18       Nutrition:  Target Goals: Understanding of nutrition guidelines, daily intake of sodium <1519m, cholesterol <2053m calories 30% from fat and 7% or less from saturated fats, daily to have 5 or more servings of fruits and vegetables.  Biometrics: Pre Biometrics - 03/14/18 1323      Pre Biometrics   Height  5' 3.25" (1.607 m)    Weight  206 lb 12.8 oz (93.8 kg)    Waist Circumference  40.5 inches    Hip Circumference  49 inches    Waist to Hip Ratio  0.83 %    BMI (Calculated)  36.32    Single Leg Stand  2.5 seconds        Nutrition Therapy Plan and Nutrition Goals: Nutrition Therapy & Goals - 03/14/18 1153      Personal Nutrition Goals   Nutrition Goal  weight loss    Personal Goal #2  Meet with the dietician      Intervention Plan   Intervention  Prescribe, educate and counsel regarding individualized specific dietary modifications aiming towards targeted core components such as weight, hypertension, lipid management, diabetes, heart failure and other comorbidities.;Nutrition handout(s) given to patient.    Expected Outcomes  Short Term Goal: Understand basic principles of dietary content, such as calories, fat, sodium, cholesterol and nutrients.;Long Term Goal: Adherence to prescribed nutrition plan.       Nutrition Assessments: Nutrition Assessments - 03/14/18 1225      MEDFICTS Scores   Pre Score  6       Nutrition Goals Re-Evaluation:  Nutrition Goals Discharge (Final Nutrition Goals Re-Evaluation):   Psychosocial: Target Goals: Acknowledge presence or absence of significant depression and/or stress, maximize coping skills, provide positive support system. Participant is able to verbalize types and ability to use techniques and skills needed for reducing stress and depression.   Initial Review &  Psychosocial Screening: Initial Psych Review & Screening - 03/14/18 1151      Initial Review   Current issues with  History of Depression;Current Stress Concerns    Source of Stress Concerns  Chronic Illness    Comments  Her illness was making her depressed and has since brought herself out of it. She thought of her mom and it made her want to get better.       Family Dynamics   Good Support System?  Yes    Comments  She can look to her husband and her kids.      Barriers   Psychosocial barriers to participate in program  The patient should benefit from training in stress management and relaxation.      Screening Interventions   Interventions  Program counselor consult;Encouraged to exercise;To provide support and resources with identified psychosocial needs;Provide feedback about the scores to participant    Expected Outcomes  Short Term goal: Utilizing psychosocial counselor, staff and physician to assist with identification of specific Stressors or current issues interfering with healing process. Setting desired goal for each stressor or current issue identified.;Long Term Goal: Stressors or current issues are controlled or eliminated.;Short Term goal: Identification and review with participant of any Quality of Life or Depression concerns found by scoring the questionnaire.;Long Term goal: The participant improves quality of Life and PHQ9 Scores as seen by post scores and/or verbalization of changes       Quality of Life Scores:  Scores of 19 and below usually indicate a poorer quality of life in these areas.  A difference of  2-3 points is a clinically meaningful difference.  A difference of 2-3 points in the total score of the Quality of Life Index has been associated with significant improvement in overall quality of life, self-image, physical symptoms, and general health in studies assessing change in quality of life.  PHQ-9: Recent Review Flowsheet Data    Depression screen [facility]  2/9 05/03/2018 04/05/2018 03/20/2018 03/14/2018 12/12/2017   Decreased Interest 0 _0 0   Down, Depressed, Hopeless 0 0 2 1 0   PHQ - 2 Score 0 _1 0   Altered sleeping _2 -   Tired, decreased energy 1 0 3 3 -   Change in appetite 1 0 2 2 -   Feeling bad or failure about yourself  0 0 0 1 -   Trouble concentrating 0 _3 -   Moving slowly or fidgety/restless 0 0 0 0 -   Suicidal thoughts 0 0 0 0 -   PHQ-9 Score _4 -   Difficult doing work/chores Somewhat difficult Not difficult at all Somewhat difficult Somewhat difficult -     Interpretation of Total Score  Total Score Depression Severity:  1-4 = Minimal depression, 5-9 = Mild depression, 10-14 = Moderate depression, 15-19 = Moderately severe depression, 20-27 = Severe depression   Psychosocial Evaluation and Intervention: Psychosocial Evaluation - 03/20/18 1153      Psychosocial Evaluation & Interventions   Interventions  Stress management education;Encouraged to exercise with the program and follow exercise prescription    Comments  Counselor met  with [facility]) today for initial psychosocial evaluation.  She is a 63 year old who suffers with both COPD and Congestive Heart Failure.  Demisha has a strong support system with a spouse of 24 years and (4) adult children - (3) who live locally.  She is also actively involved in her local church.  Tyffany also is challenged with other health issues such as diabetes, HBP and high cholesterol.  She reports sleeping better recently (after training a new puppy to be quiet at night) and her appetite is adjusting to a new diet that she is on - and is being effective with some weight loss currently reported.  Sammye admits to a history of depression following the death of her mother approximately 3 years ago.  She has recovered and reports currently being in a positive mood most of the time with minimal stress other than her health conditions.  She has goals to lose weight; eat  healthier; and stabilize her blood sugars while in this program.  Staff will be following with Dewaine Oats throughout the course of this program.     Expected Outcomes  Short:  Jasmyn will meet with the dietician to address her dietary and weight loss goals.  She will exercise consistently to help her health and mental health - stress management with all of her medical conditions.  Long:  Camrin will develop healthy lifestyle habits of diet and exercise for her health and mental health.    Continue Psychosocial Services   Follow up required by staff       Psychosocial Re-Evaluation: Psychosocial Re-Evaluation    [place] Name 04/05/18 1357 05/03/18 1400           Psychosocial Re-Evaluation   Current issues with  History of Depression  History of Depression      Comments  Reviewed patient health questionnaire (PHQ-9) with patient for follow up. Previously, patients score indicated signs/symptoms of depression.  Reviewed to see if patient is improving symptom wise while in program.  Score improved and patient states that it is because she has more energy and being around more people in rehab has elevated her mood.  Reviewed patient health questionnaire (PHQ-9) with patient for follow up. Previously, patients score indicated signs/symptoms of depression.  Reviewed to see if patient is improving symptom wise while in program.  Score improved and patient states that it is because she is able to get out of the house and exercise. She states that when she is at home there is no one around and she gets down on herself.      Expected Outcomes  Short: Continue to attend [facility] regularly for regular exercise and social engagement. Long: Continue to improve symptoms and manage a positive mental state.  Short: Continue to attend [facility] regularly for regular exercise and social engagement. Long: Continue to improve symptoms and manage a positive mental state.      Interventions  Encouraged to attend Pulmonary  Rehabilitation for the exercise  Encouraged to attend Pulmonary Rehabilitation for the exercise      Continue Psychosocial Services   Follow up required by counselor  Follow up required by staff         Psychosocial Discharge (Final Psychosocial Re-Evaluation): Psychosocial Re-Evaluation - 05/03/18 1400      Psychosocial Re-Evaluation   Current issues with  History of Depression    Comments  Reviewed patient health questionnaire (PHQ-9) with patient for follow up. Previously, patients score indicated signs/symptoms of depression.  Reviewed to  see if patient is improving symptom wise while in program.  Score improved and patient states that it is because she is able to get out of the house and exercise. She states that when she is at home there is no one around and she gets down on herself.    Expected Outcomes  Short: Continue to attend [facility] regularly for regular exercise and social engagement. Long: Continue to improve symptoms and manage a positive mental state.    Interventions  Encouraged to attend Pulmonary Rehabilitation for the exercise    Continue Psychosocial Services   Follow up required by staff       Education: Education Goals: Education classes will be provided on a weekly basis, covering required topics. Participant will state understanding/return demonstration of topics presented.  Learning Barriers/Preferences: Learning Barriers/Preferences - 03/14/18 1155      Learning Barriers/Preferences   Learning Barriers  Sight   wears glasses   Learning Preferences  None       Education Topics:  Initial Evaluation Education: - Verbal, written and demonstration of respiratory meds, oximetry and breathing techniques. Instruction on use of nebulizers and MDIs and importance of monitoring MDI activations.   Pulmonary Rehab from 05/03/2018 in [facility] Cardiac and Pulmonary Rehab  Date  03/14/18  Educator  [facility]  Instruction Review Code  1- Verbalizes Understanding      General  Nutrition Guidelines/Fats and Fiber: -Group instruction provided by verbal, written material, models and posters to present the general guidelines for heart healthy nutrition. Gives an explanation and review of dietary fats and fiber.   Pulmonary Rehab from 05/03/2018 in [facility] Cardiac and Pulmonary Rehab  Date  04/05/18  Educator  [facility]  Instruction Review Code  1- Verbalizes Understanding      Controlling Sodium/Reading Food Labels: -Group verbal and written material supporting the discussion of sodium use in heart healthy nutrition. Review and explanation with models, verbal and written materials for utilization of the food label.   Pulmonary Rehab from 05/03/2018 in [facility] Cardiac and Pulmonary Rehab  Date  04/12/18  Educator  [facility]  Instruction Review Code  1- Verbalizes Understanding      Exercise Physiology & General Exercise Guidelines: - Group verbal and written instruction with models to review the exercise physiology of the cardiovascular system and associated critical values. Provides general exercise guidelines with specific guidelines to those with heart or lung disease.    Aerobic Exercise & Resistance Training: - Gives group verbal and written instruction on the various components of exercise. Focuses on aerobic and resistive training programs and the benefits of this training and how to safely progress through these programs.   Flexibility, Balance, Mind/Body Relaxation: Provides group verbal/written instruction on the benefits of flexibility and balance training, including mind/body exercise modes such as yoga, pilates and tai chi.  Demonstration and skill practice provided.   Pulmonary Rehab from 05/03/2018 in [facility] Cardiac and Pulmonary Rehab  Date  03/22/18  Educator  [facility]  Instruction Review Code  1- Verbalizes Understanding      Stress and Anxiety: - Provides group verbal and written instruction about the health risks of elevated stress and causes of high stress.  Discuss  the correlation between heart/lung disease and anxiety and treatment options. Review healthy ways to manage with stress and anxiety.   Pulmonary Rehab from 05/03/2018 in [facility] Cardiac and Pulmonary Rehab  Date  03/29/18  Educator  [facility]  Instruction Review Code  1- Verbalizes Understanding      Depression: -  Provides group verbal and written instruction on the correlation between heart/lung disease and depressed mood, treatment options, and the stigmas associated with seeking treatment.   Exercise & Equipment Safety: - Individual verbal instruction and demonstration of equipment use and safety with use of the equipment.   Pulmonary Rehab from 05/03/2018 in [facility] Cardiac and Pulmonary Rehab  Date  03/14/18  Educator  [facility]  Instruction Review Code  1- Verbalizes Understanding      Infection Prevention: - Provides verbal and written material to individual with discussion of infection control including proper hand washing and proper equipment cleaning during exercise session.   Pulmonary Rehab from 05/03/2018 in [facility] Cardiac and Pulmonary Rehab  Date  03/14/18  Educator  [facility]  Instruction Review Code  1- Verbalizes Understanding      Falls Prevention: - Provides verbal and written material to individual with discussion of falls prevention and safety.   Pulmonary Rehab from 05/03/2018 in [facility] Cardiac and Pulmonary Rehab  Date  03/14/18  Educator  [facility]  Instruction Review Code  1- Verbalizes Understanding      Diabetes: - Individual verbal and written instruction to review signs/symptoms of diabetes, desired ranges of glucose level fasting, after meals and with exercise. Advice that pre and post exercise glucose checks will be done for 3 sessions at entry of program.   Pulmonary Rehab from 05/03/2018 in [facility] Cardiac and Pulmonary Rehab  Date  03/14/18  Educator  [facility]  Instruction Review Code  1- Verbalizes Understanding      Chronic Lung Diseases: - Group verbal and written instruction to  review updates, respiratory medications, advancements in procedures and treatments. Discuss use of supplemental oxygen including available portable oxygen systems, continuous and intermittent flow rates, concentrators, personal use and safety guidelines. Review proper use of inhaler and spacers. Provide informative websites for self-education.    Pulmonary Rehab from 05/03/2018 in [facility] Cardiac and Pulmonary Rehab  Date  03/31/18  Educator  [facility]  Instruction Review Code  1- Verbalizes Understanding      Energy Conservation: - Provide group verbal and written instruction for methods to conserve energy, plan and organize activities. Instruct on pacing techniques, use of adaptive equipment and posture/positioning to relieve shortness of breath.   Triggers and Exacerbations: - Group verbal and written instruction to review types of environmental triggers and ways to prevent exacerbations. Discuss weather changes, air quality and the benefits of nasal washing. Review warning signs and symptoms to help prevent infections. Discuss techniques for effective airway clearance, coughing, and vibrations.   AED/CPR: - Group verbal and written instruction with the use of models to demonstrate the basic use of the AED with the basic ABC's of resuscitation.   Anatomy and Physiology of the Lungs: - Group verbal and written instruction with the use of models to provide basic lung anatomy and physiology related to function, structure and complications of lung disease.   Pulmonary Rehab from 05/03/2018 in [facility] Cardiac and Pulmonary Rehab  Date  05/03/18  Educator  [facility]  Instruction Review Code  1- Verbalizes Understanding      Anatomy & Physiology of the Heart: - Group verbal and written instruction and models provide basic cardiac anatomy and physiology, with the coronary electrical and arterial systems. Review of Valvular disease and Heart Failure   Cardiac Medications: - Group verbal and written instruction  to review commonly prescribed medications for heart disease. Reviews the medication, class of the drug, and side effects.   Know Your Numbers and Risk Factors: -  Group verbal and written instruction about important numbers in your health.  Discussion of what are risk factors and how they play a role in the disease process.  Review of Cholesterol, Blood Pressure, Diabetes, and BMI and the role they play in your overall health.   Sleep Hygiene: -Provides group verbal and written instruction about how sleep can affect your health.  Define sleep hygiene, discuss sleep cycles and impact of sleep habits. Review good sleep hygiene tips.    Other: -Provides group and verbal instruction on various topics (see comments)    Knowledge Questionnaire Score: Knowledge Questionnaire Score - 03/14/18 1155      Knowledge Questionnaire Score   Pre Score  15/18   reviewed with patient       Core Components/Risk Factors/Patient Goals at Admission: Personal Goals and Risk Factors at Admission - 03/14/18 1156      Core Components/Risk Factors/Patient Goals on Admission    Weight Management  Yes;Weight Loss;Obesity    Intervention  Weight Management: Develop a combined nutrition and exercise program designed to reach desired caloric intake, while maintaining appropriate intake of nutrient and fiber, sodium and fats, and appropriate energy expenditure required for the weight goal.;Weight Management: Provide education and appropriate resources to help participant work on and attain dietary goals.;Weight Management/Obesity: Establish reasonable short term and long term weight goals.    Admit Weight  206 lb 12.8 oz (93.8 kg)    Goal Weight: Short Term  201 lb (91.2 kg)    Goal Weight: Long Term  160 lb (72.6 kg)    Expected Outcomes  Short Term: Continue to assess and modify interventions until short term weight is achieved;Long Term: Adherence to nutrition and physical activity/exercise program aimed toward  attainment of established weight goal;Weight Maintenance: Understanding of the daily nutrition guidelines, which includes 25-35% calories from fat, 7% or less cal from saturated fats, less than 219m cholesterol, less than 1.5gm of sodium, & 5 or more servings of fruits and vegetables daily;Weight Loss: Understanding of general recommendations for a balanced deficit meal plan, which promotes 1-2 lb weight loss per week and includes a negative energy balance of [phone] kcal/d    Improve shortness of breath with ADL's  Yes    Intervention  Provide education, individualized exercise plan and daily activity instruction to help decrease symptoms of SOB with activities of daily living.    Expected Outcomes  Short Term: Improve cardiorespiratory fitness to achieve a reduction of symptoms when performing ADLs;Long Term: Be able to perform more ADLs without symptoms or delay the onset of symptoms    Diabetes  Yes    Intervention  Provide education about signs/symptoms and action to take for hypo/hyperglycemia.;Provide education about proper nutrition, including hydration, and aerobic/resistive exercise prescription along with prescribed medications to achieve blood glucose in normal ranges: Fasting glucose 65-99 mg/dL    Expected Outcomes  Short Term: Participant verbalizes understanding of the signs/symptoms and immediate care of hyper/hypoglycemia, proper foot care and importance of medication, aerobic/resistive exercise and nutrition plan for blood glucose control.;Long Term: Attainment of HbA1C < 7%.    Heart Failure  Yes    Intervention  Provide a combined exercise and nutrition program that is supplemented with education, support and counseling about heart failure. Directed toward relieving symptoms such as shortness of breath, decreased exercise tolerance, and extremity edema.    Expected Outcomes  Improve functional capacity of life;Short term: Attendance in program 2-3 days a week with increased exercise  capacity. Reported lower sodium  intake. Reported increased fruit and vegetable intake. Reports medication compliance.;Short term: Daily weights obtained and reported for increase. Utilizing diuretic protocols set by physician.;Long term: Adoption of self-care skills and reduction of barriers for early signs and symptoms recognition and intervention leading to self-care maintenance.    Hypertension  Yes    Intervention  Provide education on lifestyle modifcations including regular physical activity/exercise, weight management, moderate sodium restriction and increased consumption of fresh fruit, vegetables, and low fat dairy, alcohol moderation, and smoking cessation.;Monitor prescription use compliance.    Expected Outcomes  Short Term: Continued assessment and intervention until BP is < 140/37m HG in hypertensive participants. < 130/845mHG in hypertensive participants with diabetes, heart failure or chronic kidney disease.;Long Term: Maintenance of blood pressure at goal levels.    Lipids  Yes    Intervention  Provide education and support for participant on nutrition & aerobic/resistive exercise along with prescribed medications to achieve LDL <7061mHDL >81m42m  Expected Outcomes  Short Term: Participant states understanding of desired cholesterol values and is compliant with medications prescribed. Participant is following exercise prescription and nutrition guidelines.;Long Term: Cholesterol controlled with medications as prescribed, with individualized exercise RX and with personalized nutrition plan. Value goals: LDL < 70mg48mL > 40 mg.       Core Components/Risk Factors/Patient Goals Review:  Goals and Risk Factor Review    Row Name 05/11/18 1352             Core Components/Risk Factors/Patient Goals Review   Personal Goals Review  Weight Management/Obesity;Improve shortness of breath with ADL's;Lipids;Hypertension;Diabetes       Review  Patient has lost some weight since the start of  the program. She states she her weight was 199 this morning. She is checking her blood pressure at home and was 124/74 this morning. Her oxygen was 96 percent with a HR or 88. She is checking her sugar at home and was 99 fasting. She is trying to stay busy while being confined to home.       Expected Outcomes  Short: Exercise and take medications as prescribed to help with overall health. Inform staff of any changes while [facility] is closed. Long: maintain exercise and medications independently to improve upon overall health          Core Components/Risk Factors/Patient Goals at Discharge (Final Review):  Goals and Risk Factor Review - 05/11/18 1352      Core Components/Risk Factors/Patient Goals Review   Personal Goals Review  Weight Management/Obesity;Improve shortness of breath with ADL's;Lipids;Hypertension;Diabetes    Review  Patient has lost some weight since the start of the program. She states she her weight was 199 this morning. She is checking her blood pressure at home and was 124/74 this morning. Her oxygen was 96 percent with a HR or 88. She is checking her sugar at home and was 99 fasting. She is trying to stay busy while being confined to home.    Expected Outcomes  Short: Exercise and take medications as prescribed to help with overall health. Inform staff of any changes while [facility] is closed. Long: maintain exercise and medications independently to improve upon overall health       ITP Comments: ITP Comments    Row Name 03/14/18 1220 04/03/18 0823 04/27/18 1248 05/01/18 0810 05/11/18 1347   ITP Comments  Medical Evaluation completed. Chart sent for review and changes to Dr. Mark Emily Filbertctor of [facility] [place]  30  day review completed. ITP sent to Dr. Emily Filbert Director of [place]. Continue with ITP unless changes are made by physician.  LM on VM for patient to call back to Pulmonary Rehab.  Patient has been out due to being  sick. Unable to obtain all goals. 30 day review completed. ITP sent to Dr. Emily Filbert Director of [place]. Continue with ITP unless changes are made by physician.  Our program is currently closed due to COVID-19.  We are communicating with patient via phone calls and emails.   [place] Name 05/19/18 1050           ITP Comments  Arelis sent an email to let us know that she fell and broke her femur.  Called pt to check in.  She will be getting home health PT for now.  We will discharge her at this time while she recovers.          Comments: Discharge ITP

## 2018-05-29 ENCOUNTER — Encounter: Payer: Self-pay | Admitting: *Deleted

## 2018-05-29 DIAGNOSIS — I5042 Chronic combined systolic (congestive) and diastolic (congestive) heart failure: Secondary | ICD-10-CM

## 2018-05-29 NOTE — Progress Notes (Signed)
Pulmonary Individual Treatment Plan  Patient Details  Name: Brenda Wu MRN: [id number] Date of Birth: May 16, 1955 Referring Provider:     Pulmonary Rehab from 03/14/2018 in [facility] Cardiac and Pulmonary Rehab  Referring Provider  Gollan      Initial Encounter Date:    Pulmonary Rehab from 03/14/2018 in [facility] Cardiac and Pulmonary Rehab  Date  03/14/18      Visit Diagnosis: Heart failure, systolic and diastolic, chronic ([place])  Patient's Home Medications on Admission:  Current Outpatient Medications:  .  albuterol (PROVENTIL HFA;VENTOLIN HFA) 108 (90 Base) MCG/ACT inhaler, Inhale 2 puffs into the lungs 4 (four) times daily as needed for wheezing or shortness of breath. , Disp: , Rfl:  .  albuterol (PROVENTIL) (2.5 MG/3ML) 0.083% nebulizer solution, Inhale 2.5 mg into the lungs every 6 (six) hours as needed for wheezing or shortness of breath. , Disp: , Rfl:  .  aspirin EC 81 MG tablet, Take 1 tablet (81 mg total) by mouth daily., Disp: 32 tablet, Rfl: 0 .  atorvastatin (LIPITOR) 40 MG tablet, Take 40 mg by mouth every evening. , Disp: , Rfl:  .  budesonide-formoterol (SYMBICORT) 160-4.5 MCG/ACT inhaler, Inhale 2 puffs into the lungs 2 (two) times daily., Disp: , Rfl:  .  diltiazem (CARDIZEM CD) 180 MG 24 hr capsule, Take 180 mg by mouth daily., Disp: , Rfl:  .  docusate sodium (COLACE) 100 MG capsule, Take 1 capsule (100 mg total) by mouth 2 (two) times daily., Disp: 10 capsule, Rfl: 0 .  enoxaparin (LOVENOX) 40 MG/0.4ML injection, Inject 0.4 mLs (40 mg total) into the skin daily for 14 days., Disp: 14 Syringe, Rfl: 0 .  ferrous sulfate 325 (65 FE) MG tablet, Take 1 tablet (325 mg total) by mouth 2 (two) times daily with a meal., Disp: , Rfl: 3 .  glipiZIDE (GLUCOTROL) 10 MG tablet, Take 10 mg by mouth 2 (two) times daily before a meal. , Disp: , Rfl:  .  guaiFENesin (MUCINEX) 600 MG 12 hr tablet, Take 600 mg by mouth 2 (two) times daily as needed for cough. , Disp: , Rfl:  .   HYDROcodone-acetaminophen (NORCO/VICODIN) 5-325 MG tablet, Take 1-2 tablets by mouth every 4 (four) hours as needed for moderate pain or severe pain., Disp: 40 tablet, Rfl: 0 .  lamoTRIgine (LAMICTAL) 200 MG tablet, Take 200 mg by mouth 2 (two) times daily. , Disp: , Rfl:  .  methocarbamol (ROBAXIN) 500 MG tablet, Take 1 tablet (500 mg total) by mouth every 6 (six) hours as needed for muscle spasms., Disp: 30 tablet, Rfl: 0 .  Multiple Vitamin (MULTI-VITAMINS) TABS, Take 1 tablet by mouth daily., Disp: , Rfl:  .  niacin 500 MG tablet, Take 500 mg by mouth daily., Disp: , Rfl:  .  potassium chloride SA (K-DUR,KLOR-CON) 20 MEQ tablet, Take 1 tablet (20 mEq total) by mouth daily., Disp: 30 tablet, Rfl: 2 .  pregabalin (LYRICA) 75 MG capsule, Take 75 mg by mouth 2 (two) times daily., Disp: , Rfl:  .  senna (SENOKOT) 8.6 MG TABS tablet, Take 1 tablet by mouth daily. , Disp: , Rfl:  .  tiotropium (SPIRIVA) 18 MCG inhalation capsule, Place 18 mcg into inhaler and inhale daily., Disp: , Rfl:  .  topiramate (TOPAMAX) 200 MG tablet, Take 200 mg by mouth 2 (two) times daily., Disp: , Rfl:  .  torsemide (DEMADEX) 20 MG tablet, Take 1 tablet (20 mg total) by mouth daily., Disp: 90 tablet, Rfl: 0 .  vitamin B-12 (CYANOCOBALAMIN) 500 MCG tablet, Take 500 mcg by mouth daily., Disp: , Rfl:   Past Medical History: Past Medical History:  Diagnosis Date  . Chronic combined systolic (congestive) and diastolic (congestive) heart failure ([place])    a. 10/2017 Echo: EF 45-50%, diff HK. Mild to mod MR, mildly dil LA. Mod dil RV w/ mildly reduced RV fxn.  [name] COPD (chronic obstructive pulmonary disease) ([place])   . Depression   . Diabetes mellitus without complication ([place])   . Hypercholesteremia   . Hypertension   . Morbid obesity ([place])   . NICM (nonischemic cardiomyopathy) ([place])   . NSTEMI (non-ST elevated myocardial infarction) ([place])    a. 12/2017 -->Cath: nl cors. EF 45-50%.  . Seizures ([place])   . Stroke ([place])   .  Tricuspid regurgitation    a. 10/2017 Echo: Mild to mod TR.    Tobacco Use: Social History   Tobacco Use  Smoking Status Former Smoker  . Packs/day: 1.00  . Years: 30.00  . Pack years: 30.00  . Types: Cigarettes  . Last attempt to quit: 02/22/2002  . Years since quitting: 16.2  Smokeless Tobacco Never Used    Labs: Recent Chemical engineer    Labs for ITP Cardiac and Pulmonary Rehab Latest Ref Rng & Units 01/05/2018 01/05/2018 01/05/2018 01/06/2018 05/13/2018   Hemoglobin A1c 4.8 - 5.6 % - - 7.4(H) - 9.0(H)   PHART 7.350 - 7.450 - 7.24(L) - 7.32(L) -   PCO2ART 32.0 - 48.0 mmHg - 84(HH) - 65(H) -   HCO3 20.0 - 28.0 mmol/L 34.4(H) 35.2(H) - 33.5(H) -   O2SAT % 99.2 90.2 - 81.6 -       Pulmonary Assessment Scores: Pulmonary Assessment Scores    Row Name 03/14/18 1222         ADL UCSD   ADL Phase  Entry     SOB Score total  54     Rest  0     Walk  3     Stairs  5     Bath  2     Dress  1     Shop  4       CAT Score   CAT Score  12       mMRC Score   mMRC Score  3        Pulmonary Function Assessment: Pulmonary Function Assessment - 03/14/18 1150      Breath   Bilateral Breath Sounds  Clear    Shortness of Breath  Yes;Fear of Shortness of Breath;Limiting activity;Panic with Shortness of Breath       Exercise Target Goals: Exercise Program Goal: Individual exercise prescription set using results from initial 6 min walk test and THRR while considering  patient's activity barriers and safety.   Exercise Prescription Goal: Initial exercise prescription builds to 30-45 minutes a day of aerobic activity, 2-3 days per week.  Home exercise guidelines will be given to patient during program as part of exercise prescription that the participant will acknowledge.  Activity Barriers & Risk Stratification:   6 Minute Walk: 6 Minute Walk    Row Name 03/14/18 1326         6 Minute Walk   Phase  Initial     Distance  765 feet     Walk Time  5.25 minutes      # of Rest Breaks  1     MPH  1.65     METS  2.2  RPE  17     Perceived Dyspnea   2.5     VO2 Peak  7.7     Symptoms  No     Resting HR  84 bpm     Resting BP  94/60     Resting Oxygen Saturation   96 %     Exercise Oxygen Saturation  during 6 min walk  83 %     Max Ex. HR  139 bpm     Max Ex. BP  120/64     2 Minute Post BP  116/58       Interval HR   1 Minute HR  84     3 Minute HR  116     4 Minute HR  131     5 Minute HR  129     6 Minute HR  139     2 Minute Post HR  96     Interval Heart Rate?  Yes       Interval Oxygen   Interval Oxygen?  Yes     Baseline Oxygen Saturation %  96 %     1 Minute Oxygen Saturation %  91 %     1 Minute Liters of Oxygen  2 L     2 Minute Liters of Oxygen  2 L     3 Minute Oxygen Saturation %  90 %     3 Minute Liters of Oxygen  2 L     4 Minute Oxygen Saturation %  87 %     4 Minute Liters of Oxygen  2 L     5 Minute Oxygen Saturation %  85 %     5 Minute Liters of Oxygen  2 L     6 Minute Oxygen Saturation %  83 %     6 Minute Liters of Oxygen  2 L     2 Minute Post Oxygen Saturation %  93 %     2 Minute Post Liters of Oxygen  2 L       Oxygen Initial Assessment: Oxygen Initial Assessment - 03/14/18 1149      Home Oxygen   Home Oxygen Device  Home Concentrator;E-Tanks    Sleep Oxygen Prescription  Continuous;BiPAP    Liters per minute  2    Home Exercise Oxygen Prescription  Continuous    Liters per minute  2    Home at Rest Exercise Oxygen Prescription  Continuous    Liters per minute  2    Compliance with Home Oxygen Use  Yes      Initial 6 min Walk   Oxygen Used  Continuous;E-Tanks    Liters per minute  2      Program Oxygen Prescription   Program Oxygen Prescription  Continuous;E-Tanks    Liters per minute  2      Intervention   Short Term Goals  To learn and exhibit compliance with exercise, home and travel O2 prescription;To learn and understand importance of monitoring SPO2 with pulse oximeter and  demonstrate accurate use of the pulse oximeter.;To learn and understand importance of maintaining oxygen saturations>88%;To learn and demonstrate proper pursed lip breathing techniques or other breathing techniques.;To learn and demonstrate proper use of respiratory medications    Long  Term Goals  Exhibits compliance with exercise, home and travel O2 prescription;Verbalizes importance of monitoring SPO2 with pulse oximeter and return demonstration;Maintenance of O2 saturations>88%;Exhibits proper breathing techniques, such as pursed lip breathing or  other method taught during program session;Compliance with respiratory medication;Demonstrates proper use of MDI's       Oxygen Re-Evaluation: Oxygen Re-Evaluation    Row Name 03/20/18 1025 05/03/18 1402           Program Oxygen Prescription   Program Oxygen Prescription  Continuous;E-Tanks  Continuous;E-Tanks      Liters per minute  2  2        Home Oxygen   Home Oxygen Device  Home Concentrator;E-Tanks  Home Concentrator;E-Tanks      Sleep Oxygen Prescription  Continuous;BiPAP  Continuous;BiPAP      Liters per minute  2  2      Home Exercise Oxygen Prescription  Continuous  Continuous      Liters per minute  2  2      Home at Rest Exercise Oxygen Prescription  Continuous  Continuous      Liters per minute  2  2      Compliance with Home Oxygen Use  Yes  -        Goals/Expected Outcomes   Short Term Goals  To learn and exhibit compliance with exercise, home and travel O2 prescription;To learn and understand importance of monitoring SPO2 with pulse oximeter and demonstrate accurate use of the pulse oximeter.;To learn and understand importance of maintaining oxygen saturations>88%;To learn and demonstrate proper pursed lip breathing techniques or other breathing techniques.;To learn and demonstrate proper use of respiratory medications  To learn and exhibit compliance with exercise, home and travel O2 prescription;To learn and understand  importance of monitoring SPO2 with pulse oximeter and demonstrate accurate use of the pulse oximeter.;To learn and understand importance of maintaining oxygen saturations>88%;To learn and demonstrate proper pursed lip breathing techniques or other breathing techniques.;To learn and demonstrate proper use of respiratory medications      Long  Term Goals  Exhibits compliance with exercise, home and travel O2 prescription;Verbalizes importance of monitoring SPO2 with pulse oximeter and return demonstration;Maintenance of O2 saturations>88%;Exhibits proper breathing techniques, such as pursed lip breathing or other method taught during program session;Compliance with respiratory medication;Demonstrates proper use of MDI's  Exhibits compliance with exercise, home and travel O2 prescription;Verbalizes importance of monitoring SPO2 with pulse oximeter and return demonstration;Maintenance of O2 saturations>88%;Exhibits proper breathing techniques, such as pursed lip breathing or other method taught during program session;Compliance with respiratory medication;Demonstrates proper use of MDI's      Comments  Reviewed PLB technique with pt.  Talked about how it work and it's important to maintaining his exercise saturations.    Informed patient about how to use, when to use and gave direction on how to improve their respiratory function. Patient performed about 10 repetitions with the device with no respiratory distress. Patient was sent home with the device and instructions. Patient verbalizes understanding of the device.      Goals/Expected Outcomes  Short: Become more profiecient at using PLB.   Long: Become independent at using PLB.  Short: use inspiratory trainer daily to improve lung function. Long: maintain use of inspiratory trainer independently.         Oxygen Discharge (Final Oxygen Re-Evaluation): Oxygen Re-Evaluation - 05/03/18 1402      Program Oxygen Prescription   Program Oxygen Prescription   Continuous;E-Tanks    Liters per minute  2      Home Oxygen   Home Oxygen Device  Home Concentrator;E-Tanks    Sleep Oxygen Prescription  Continuous;BiPAP    Liters per minute  2    Home  Exercise Oxygen Prescription  Continuous    Liters per minute  2    Home at Rest Exercise Oxygen Prescription  Continuous    Liters per minute  2      Goals/Expected Outcomes   Short Term Goals  To learn and exhibit compliance with exercise, home and travel O2 prescription;To learn and understand importance of monitoring SPO2 with pulse oximeter and demonstrate accurate use of the pulse oximeter.;To learn and understand importance of maintaining oxygen saturations>88%;To learn and demonstrate proper pursed lip breathing techniques or other breathing techniques.;To learn and demonstrate proper use of respiratory medications    Long  Term Goals  Exhibits compliance with exercise, home and travel O2 prescription;Verbalizes importance of monitoring SPO2 with pulse oximeter and return demonstration;Maintenance of O2 saturations>88%;Exhibits proper breathing techniques, such as pursed lip breathing or other method taught during program session;Compliance with respiratory medication;Demonstrates proper use of MDI's    Comments  Informed patient about how to use, when to use and gave direction on how to improve their respiratory function. Patient performed about 10 repetitions with the device with no respiratory distress. Patient was sent home with the device and instructions. Patient verbalizes understanding of the device.    Goals/Expected Outcomes  Short: use inspiratory trainer daily to improve lung function. Long: maintain use of inspiratory trainer independently.       Initial Exercise Prescription: Initial Exercise Prescription - 03/14/18 1300      Date of Initial Exercise RX and Referring Provider   Date  03/14/18    Referring Provider  Gollan      Treadmill   MPH  1.5    Grade  0    Minutes  15     METs  2.15      NuStep   Level  2    SPM  80    Minutes  15    METs  2      Biostep-RELP   Level  2    SPM  50    Minutes  15    METs  2      Prescription Details   Frequency (times per week)  3    Duration  Progress to 45 minutes of aerobic exercise without signs/symptoms of physical distress      Intensity   THRR 40-80% of Max Heartrate  114-144    Ratings of Perceived Exertion  11-15    Perceived Dyspnea  0-4      Resistance Training   Training Prescription  Yes    Weight  3 lb    Reps  10-15       Perform Capillary Blood Glucose checks as needed.  Exercise Prescription Changes: Exercise Prescription Changes    Row Name 03/23/18 0800 04/05/18 1000 04/05/18 1400 04/19/18 1200 05/02/18 0900     Response to Exercise   Blood Pressure (Admit)  118/66  -  134/62  126/72  98/62   Blood Pressure (Exercise)  124/62  -  -  -  138/72   Blood Pressure (Exit)  132/64  -  132/60  124/62  108/62   Heart Rate (Admit)  95 bpm  -  88 bpm  95 bpm  84 bpm   Heart Rate (Exercise)  123 bpm  -  100 bpm  110 bpm  104 bpm   Heart Rate (Exit)  91 bpm  -  90 bpm  84 bpm  82 bpm   Oxygen Saturation (Admit)  92 %  -  97 %  90 %  93 %   Oxygen Saturation (Exercise)  89 %  -  91 %  90 %  94 %   Oxygen Saturation (Exit)  95 %  -  95 %  95 %  98 %   Rating of Perceived Exertion (Exercise)  13  -  _0 Perceived Dyspnea (Exercise)  2  -  0  2  2   Symptoms  none  -  none  none  none   Comments  second day exercise  -  -  -  out sick since 2/19   Duration  Progress to 45 minutes of aerobic exercise without signs/symptoms of physical distress  -  Continue with 45 min of aerobic exercise without signs/symptoms of physical distress.  Continue with 45 min of aerobic exercise without signs/symptoms of physical distress.  Continue with 45 min of aerobic exercise without signs/symptoms of physical distress.   Intensity  THRR unchanged  -  THRR unchanged  THRR unchanged  THRR unchanged      Progression   Progression  Continue to progress workloads to maintain intensity without signs/symptoms of physical distress.  -  Continue to progress workloads to maintain intensity without signs/symptoms of physical distress.  Continue to progress workloads to maintain intensity without signs/symptoms of physical distress.  Continue to progress workloads to maintain intensity without signs/symptoms of physical distress.   Average METs  2.4  -  2.42  2.9  2.2     Resistance Training   Training Prescription  Yes  -  Yes  Yes  Yes   Weight  3 lb  -  3 lbs  3 lb  3 lb   Reps  10-15  -  10-15  10-15  10-15     Interval Training   Interval Training  -  -  No  No  No     Oxygen   Oxygen  -  -  Continuous  Continuous  Continuous   Liters  -  -  _1 Treadmill   MPH  2  -  2  2  1.5   Grade  0.5  -  0.5  1  0   Minutes  15  -  _2 METs  2.67  -  2.67  2.81  -     NuStep   Level  2  -  2  -  4   SPM  80  -  -  -  -   Minutes  15  -  15  -  15   METs  2.1  -  2.6  -  1.8     REL-XR   Level  -  -  -  5  -   Minutes  -  -  -  15  -   METs  -  -  -  4.3  -     Biostep-RELP   Level  -  -  _3 SPM  -  -  -  50  50   Minutes  -  -  _4 METs  -  -  _5 Home Exercise Plan   Plans to continue exercise at  -  Home (comment) walking, elliptical, bike  Home (  comment) walking, elliptical, bike  Home (comment) walking, elliptical, bike  Home (comment) walking, elliptical, bike   Frequency  -  Add 1 additional day to program exercise sessions.  Add 1 additional day to program exercise sessions.  Add 1 additional day to program exercise sessions.  Add 1 additional day to program exercise sessions.   Initial Home Exercises Provided  -  04/05/18  04/05/18  04/05/18  04/05/18   Row Name 05/16/18 1300             Response to Exercise   Blood Pressure (Admit)  128/64       Blood Pressure (Exit)  120/64       Heart Rate (Admit)  85 bpm       Heart Rate  (Exercise)  115 bpm       Heart Rate (Exit)  84 bpm       Oxygen Saturation (Admit)  97 %       Oxygen Saturation (Exercise)  93 %       Oxygen Saturation (Exit)  95 %       Rating of Perceived Exertion (Exercise)  13       Perceived Dyspnea (Exercise)  2       Symptoms  none       Comments  returned on 05/01/18       Duration  Continue with 45 min of aerobic exercise without signs/symptoms of physical distress.       Intensity  THRR unchanged         Progression   Progression  Continue to progress workloads to maintain intensity without signs/symptoms of physical distress.       Average METs  2.54         Resistance Training   Training Prescription  Yes       Weight  3 lb       Reps  10-15         Interval Training   Interval Training  No         Oxygen   Oxygen  Continuous       Liters  2         Treadmill   MPH  2       Grade  1       Minutes  15       METs  2.81         NuStep   Level  4       Minutes  15       METs  1.8         Biostep-RELP   Level  4       Minutes  15       METs  3         Home Exercise Plan   Plans to continue exercise at  Home (comment) walking, elliptical, bike       Frequency  Add 1 additional day to program exercise sessions.       Initial Home Exercises Provided  04/05/18          Exercise Comments: Exercise Comments    Row Name 03/20/18 1023           Exercise Comments   First full day of exercise!  Patient was oriented to gym and equipment including functions, settings, policies, and procedures.  Patient's individual exercise prescription and treatment plan were reviewed.  All starting workloads were established based on the results of the  6 minute walk test done at initial orientation visit.  The plan for exercise progression was also introduced and progression will be customized based on patient's performance and goals.          Exercise Goals and Review: Exercise Goals    Row Name 03/14/18 1325             Exercise  Goals   Increase Physical Activity  Yes       Intervention  Provide advice, education, support and counseling about physical activity/exercise needs.;Develop an individualized exercise prescription for aerobic and resistive training based on initial evaluation findings, risk stratification, comorbidities and participant's personal goals.       Expected Outcomes  Short Term: Attend rehab on a regular basis to increase amount of physical activity.;Long Term: Add in home exercise to make exercise part of routine and to increase amount of physical activity.;Long Term: Exercising regularly at least 3-5 days a week.       Increase Strength and Stamina  Yes       Intervention  Provide advice, education, support and counseling about physical activity/exercise needs.;Develop an individualized exercise prescription for aerobic and resistive training based on initial evaluation findings, risk stratification, comorbidities and participant's personal goals.       Expected Outcomes  Short Term: Increase workloads from initial exercise prescription for resistance, speed, and METs.;Short Term: Perform resistance training exercises routinely during rehab and add in resistance training at home;Long Term: Improve cardiorespiratory fitness, muscular endurance and strength as measured by increased METs and functional capacity (6MWT)       Able to understand and use rate of perceived exertion (RPE) scale  Yes       Intervention  Provide education and explanation on how to use RPE scale       Expected Outcomes  Short Term: Able to use RPE daily in rehab to express subjective intensity level;Long Term:  Able to use RPE to guide intensity level when exercising independently       Able to understand and use Dyspnea scale  Yes       Intervention  Provide education and explanation on how to use Dyspnea scale       Expected Outcomes  Short Term: Able to use Dyspnea scale daily in rehab to express subjective sense of shortness of  breath during exertion;Long Term: Able to use Dyspnea scale to guide intensity level when exercising independently       Knowledge and understanding of Target Heart Rate Range (THRR)  Yes       Intervention  Provide education and explanation of THRR including how the numbers were predicted and where they are located for reference       Expected Outcomes  Short Term: Able to state/look up THRR;Short Term: Able to use daily as guideline for intensity in rehab;Long Term: Able to use THRR to govern intensity when exercising independently       Able to check pulse independently  Yes       Intervention  Provide education and demonstration on how to check pulse in carotid and radial arteries.;Review the importance of being able to check your own pulse for safety during independent exercise       Expected Outcomes  Short Term: Able to explain why pulse checking is important during independent exercise;Long Term: Able to check pulse independently and accurately       Understanding of Exercise Prescription  Yes       Intervention  Provide education,  explanation, and written materials on patient's individual exercise prescription       Expected Outcomes  Short Term: Able to explain program exercise prescription;Long Term: Able to explain home exercise prescription to exercise independently          Exercise Goals Re-Evaluation : Exercise Goals Re-Evaluation    Row Name 03/20/18 1023 03/23/18 0846 04/05/18 1040 04/19/18 1240 05/02/18 0920     Exercise Goal Re-Evaluation   Exercise Goals Review  Increase Physical Activity;Increase Strength and Stamina;Able to understand and use rate of perceived exertion (RPE) scale;Knowledge and understanding of Target Heart Rate Range (THRR);Understanding of Exercise Prescription;Able to understand and use Dyspnea scale  Increase Physical Activity;Increase Strength and Stamina;Able to understand and use rate of perceived exertion (RPE) scale;Able to understand and use Dyspnea  scale;Knowledge and understanding of Target Heart Rate Range (THRR)  Increase Physical Activity;Able to understand and use rate of perceived exertion (RPE) scale;Knowledge and understanding of Target Heart Rate Range (THRR);Understanding of Exercise Prescription;Increase Strength and Stamina;Able to check pulse independently;Able to understand and use Dyspnea scale  Increase Physical Activity;Increase Strength and Stamina;Able to understand and use rate of perceived exertion (RPE) scale;Able to understand and use Dyspnea scale;Knowledge and understanding of Target Heart Rate Range (THRR);Understanding of Exercise Prescription  Increase Physical Activity;Increase Strength and Stamina;Able to understand and use rate of perceived exertion (RPE) scale;Able to understand and use Dyspnea scale;Able to check pulse independently;Understanding of Exercise Prescription   Comments  Reviewed RPE scale, THR and program prescription with pt today.  Pt voiced understanding and was given a copy of goals to take home.   Brenda Wu has tolerated exercise well and increased speed and grade on TM.  Staff will monitor progress.  Reviewed home exercise with pt today.  Pt plans to walk and use elliptical and bike at home for exercise.  Reviewed THR, pulse, RPE, sign and symptoms, and when to call 911 or MD.  Also discussed weather considerations and indoor options.  Pt voiced understanding.  Brenda Wu has improved overall MET level.  She requested change from NS to XR for more challenge. Staff will monitor progress.  Brenda Wu has just come back after missing several sessions due to illness.  She is working back up to previous levels.    Expected Outcomes  Short: Use RPE daily to regulate intensity. Long: Follow program prescription in THR.  Short - attend LW consistently Long - increase MET level  Short: Start to add in exercise at home.  Long: Continue to increase physical activity  Short - attend consistently Long - increase MET level  Short -  build up to previous level Long - increase MET level   [place] Name 05/11/18 1348             Exercise Goal Re-Evaluation   Exercise Goals Review  Increase Physical Activity;Increase Strength and Stamina;Understanding of Exercise Prescription       Comments  Informed patient of the Exercise videos sent out via email. Patient states that she has a treadmill and bike that she can use while she is at home. She understands that there are videos to watch and understands how to keep her exercise going.       Expected Outcomes  Short: maintain exercise by using home equipment and watching videos. Long: maintian independance with exercise.          Discharge Exercise Prescription (Final Exercise Prescription Changes): Exercise Prescription Changes - 05/16/18 1300      Response to Exercise  Blood Pressure (Admit)  128/64    Blood Pressure (Exit)  120/64    Heart Rate (Admit)  85 bpm    Heart Rate (Exercise)  115 bpm    Heart Rate (Exit)  84 bpm    Oxygen Saturation (Admit)  97 %    Oxygen Saturation (Exercise)  93 %    Oxygen Saturation (Exit)  95 %    Rating of Perceived Exertion (Exercise)  13    Perceived Dyspnea (Exercise)  2    Symptoms  none    Comments  returned on 05/01/18    Duration  Continue with 45 min of aerobic exercise without signs/symptoms of physical distress.    Intensity  THRR unchanged      Progression   Progression  Continue to progress workloads to maintain intensity without signs/symptoms of physical distress.    Average METs  2.54      Resistance Training   Training Prescription  Yes    Weight  3 lb    Reps  10-15      Interval Training   Interval Training  No      Oxygen   Oxygen  Continuous    Liters  2      Treadmill   MPH  2    Grade  1    Minutes  15    METs  2.81      NuStep   Level  4    Minutes  15    METs  1.8      Biostep-RELP   Level  4    Minutes  15    METs  3      Home Exercise Plan   Plans to continue exercise at  Home  (comment)   walking, elliptical, bike   Frequency  Add 1 additional day to program exercise sessions.    Initial Home Exercises Provided  04/05/18       Nutrition:  Target Goals: Understanding of nutrition guidelines, daily intake of sodium <1581m, cholesterol <2052m calories 30% from fat and 7% or less from saturated fats, daily to have 5 or more servings of fruits and vegetables.  Biometrics: Pre Biometrics - 03/14/18 1323      Pre Biometrics   Height  5' 3.25" (1.607 m)    Weight  206 lb 12.8 oz (93.8 kg)    Waist Circumference  40.5 inches    Hip Circumference  49 inches    Waist to Hip Ratio  0.83 %    BMI (Calculated)  36.32    Single Leg Stand  2.5 seconds        Nutrition Therapy Plan and Nutrition Goals: Nutrition Therapy & Goals - 03/14/18 1153      Personal Nutrition Goals   Nutrition Goal  weight loss    Personal Goal #2  Meet with the dietician      Intervention Plan   Intervention  Prescribe, educate and counsel regarding individualized specific dietary modifications aiming towards targeted core components such as weight, hypertension, lipid management, diabetes, heart failure and other comorbidities.;Nutrition handout(s) given to patient.    Expected Outcomes  Short Term Goal: Understand basic principles of dietary content, such as calories, fat, sodium, cholesterol and nutrients.;Long Term Goal: Adherence to prescribed nutrition plan.       Nutrition Assessments: Nutrition Assessments - 03/14/18 1225      MEDFICTS Scores   Pre Score  6       Nutrition Goals Re-Evaluation:  Nutrition Goals Discharge (Final Nutrition Goals Re-Evaluation):   Psychosocial: Target Goals: Acknowledge presence or absence of significant depression and/or stress, maximize coping skills, provide positive support system. Participant is able to verbalize types and ability to use techniques and skills needed for reducing stress and depression.   Initial Review &  Psychosocial Screening: Initial Psych Review & Screening - 03/14/18 1151      Initial Review   Current issues with  History of Depression;Current Stress Concerns    Source of Stress Concerns  Chronic Illness    Comments  Her illness was making her depressed and has since brought herself out of it. She thought of her mom and it made her want to get better.       Family Dynamics   Good Support System?  Yes    Comments  She can look to her husband and her kids.      Barriers   Psychosocial barriers to participate in program  The patient should benefit from training in stress management and relaxation.      Screening Interventions   Interventions  Program counselor consult;Encouraged to exercise;To provide support and resources with identified psychosocial needs;Provide feedback about the scores to participant    Expected Outcomes  Short Term goal: Utilizing psychosocial counselor, staff and physician to assist with identification of specific Stressors or current issues interfering with healing process. Setting desired goal for each stressor or current issue identified.;Long Term Goal: Stressors or current issues are controlled or eliminated.;Short Term goal: Identification and review with participant of any Quality of Life or Depression concerns found by scoring the questionnaire.;Long Term goal: The participant improves quality of Life and PHQ9 Scores as seen by post scores and/or verbalization of changes       Quality of Life Scores:  Scores of 19 and below usually indicate a poorer quality of life in these areas.  A difference of  2-3 points is a clinically meaningful difference.  A difference of 2-3 points in the total score of the Quality of Life Index has been associated with significant improvement in overall quality of life, self-image, physical symptoms, and general health in studies assessing change in quality of life.  PHQ-9: Recent Review Flowsheet Data    Depression screen [facility]  2/9 05/03/2018 04/05/2018 03/20/2018 03/14/2018 12/12/2017   Decreased Interest 0 _0 0   Down, Depressed, Hopeless 0 0 2 1 0   PHQ - 2 Score 0 _1 0   Altered sleeping _2 -   Tired, decreased energy 1 0 3 3 -   Change in appetite 1 0 2 2 -   Feeling bad or failure about yourself  0 0 0 1 -   Trouble concentrating 0 _3 -   Moving slowly or fidgety/restless 0 0 0 0 -   Suicidal thoughts 0 0 0 0 -   PHQ-9 Score _4 -   Difficult doing work/chores Somewhat difficult Not difficult at all Somewhat difficult Somewhat difficult -     Interpretation of Total Score  Total Score Depression Severity:  1-4 = Minimal depression, 5-9 = Mild depression, 10-14 = Moderate depression, 15-19 = Moderately severe depression, 20-27 = Severe depression   Psychosocial Evaluation and Intervention: Psychosocial Evaluation - 03/20/18 1153      Psychosocial Evaluation & Interventions   Interventions  Stress management education;Encouraged to exercise with the program and follow exercise prescription    Comments  Counselor met  with Brenda Wu [facility]) today for initial psychosocial evaluation.  She is a 63 year old who suffers with both COPD and Congestive Heart Failure.  Demisha has a strong support system with a spouse of 24 years and (4) adult children - (3) who live locally.  She is also actively involved in her local church.  Brenda Wu also is challenged with other health issues such as diabetes, HBP and high cholesterol.  She reports sleeping better recently (after training a new puppy to be quiet at night) and her appetite is adjusting to a new diet that she is on - and is being effective with some weight loss currently reported.  Brenda Wu admits to a history of depression following the death of her mother approximately 3 years ago.  She has recovered and reports currently being in a positive mood most of the time with minimal stress other than her health conditions.  She has goals to lose weight; eat  healthier; and stabilize her blood sugars while in this program.  Staff will be following with Brenda Wu throughout the course of this program.     Expected Outcomes  Short:  Brenda Wu will meet with the dietician to address her dietary and weight loss goals.  She will exercise consistently to help her health and mental health - stress management with all of her medical conditions.  Long:  Brenda Wu will develop healthy lifestyle habits of diet and exercise for her health and mental health.    Continue Psychosocial Services   Follow up required by staff       Psychosocial Re-Evaluation: Psychosocial Re-Evaluation    [place] Name 04/05/18 1357 05/03/18 1400           Psychosocial Re-Evaluation   Current issues with  History of Depression  History of Depression      Comments  Reviewed patient health questionnaire (PHQ-9) with patient for follow up. Previously, patients score indicated signs/symptoms of depression.  Reviewed to see if patient is improving symptom wise while in program.  Score improved and patient states that it is because she has more energy and being around more people in rehab has elevated her mood.  Reviewed patient health questionnaire (PHQ-9) with patient for follow up. Previously, patients score indicated signs/symptoms of depression.  Reviewed to see if patient is improving symptom wise while in program.  Score improved and patient states that it is because she is able to get out of the house and exercise. She states that when she is at home there is no one around and she gets down on herself.      Expected Outcomes  Short: Continue to attend [facility] regularly for regular exercise and social engagement. Long: Continue to improve symptoms and manage a positive mental state.  Short: Continue to attend [facility] regularly for regular exercise and social engagement. Long: Continue to improve symptoms and manage a positive mental state.      Interventions  Encouraged to attend Pulmonary  Rehabilitation for the exercise  Encouraged to attend Pulmonary Rehabilitation for the exercise      Continue Psychosocial Services   Follow up required by counselor  Follow up required by staff         Psychosocial Discharge (Final Psychosocial Re-Evaluation): Psychosocial Re-Evaluation - 05/03/18 1400      Psychosocial Re-Evaluation   Current issues with  History of Depression    Comments  Reviewed patient health questionnaire (PHQ-9) with patient for follow up. Previously, patients score indicated signs/symptoms of depression.  Reviewed to  see if patient is improving symptom wise while in program.  Score improved and patient states that it is because she is able to get out of the house and exercise. She states that when she is at home there is no one around and she gets down on herself.    Expected Outcomes  Short: Continue to attend [facility] regularly for regular exercise and social engagement. Long: Continue to improve symptoms and manage a positive mental state.    Interventions  Encouraged to attend Pulmonary Rehabilitation for the exercise    Continue Psychosocial Services   Follow up required by staff       Education: Education Goals: Education classes will be provided on a weekly basis, covering required topics. Participant will state understanding/return demonstration of topics presented.  Learning Barriers/Preferences: Learning Barriers/Preferences - 03/14/18 1155      Learning Barriers/Preferences   Learning Barriers  Sight   wears glasses   Learning Preferences  None       Education Topics:  Initial Evaluation Education: - Verbal, written and demonstration of respiratory meds, oximetry and breathing techniques. Instruction on use of nebulizers and MDIs and importance of monitoring MDI activations.   Pulmonary Rehab from 05/03/2018 in [facility] Cardiac and Pulmonary Rehab  Date  03/14/18  Educator  [facility]  Instruction Review Code  1- Verbalizes Understanding      General  Nutrition Guidelines/Fats and Fiber: -Group instruction provided by verbal, written material, models and posters to present the general guidelines for heart healthy nutrition. Gives an explanation and review of dietary fats and fiber.   Pulmonary Rehab from 05/03/2018 in [facility] Cardiac and Pulmonary Rehab  Date  04/05/18  Educator  [facility]  Instruction Review Code  1- Verbalizes Understanding      Controlling Sodium/Reading Food Labels: -Group verbal and written material supporting the discussion of sodium use in heart healthy nutrition. Review and explanation with models, verbal and written materials for utilization of the food label.   Pulmonary Rehab from 05/03/2018 in [facility] Cardiac and Pulmonary Rehab  Date  04/12/18  Educator  [facility]  Instruction Review Code  1- Verbalizes Understanding      Exercise Physiology & General Exercise Guidelines: - Group verbal and written instruction with models to review the exercise physiology of the cardiovascular system and associated critical values. Provides general exercise guidelines with specific guidelines to those with heart or lung disease.    Aerobic Exercise & Resistance Training: - Gives group verbal and written instruction on the various components of exercise. Focuses on aerobic and resistive training programs and the benefits of this training and how to safely progress through these programs.   Flexibility, Balance, Mind/Body Relaxation: Provides group verbal/written instruction on the benefits of flexibility and balance training, including mind/body exercise modes such as yoga, pilates and tai chi.  Demonstration and skill practice provided.   Pulmonary Rehab from 05/03/2018 in [facility] Cardiac and Pulmonary Rehab  Date  03/22/18  Educator  [facility]  Instruction Review Code  1- Verbalizes Understanding      Stress and Anxiety: - Provides group verbal and written instruction about the health risks of elevated stress and causes of high stress.  Discuss  the correlation between heart/lung disease and anxiety and treatment options. Review healthy ways to manage with stress and anxiety.   Pulmonary Rehab from 05/03/2018 in [facility] Cardiac and Pulmonary Rehab  Date  03/29/18  Educator  [facility]  Instruction Review Code  1- Verbalizes Understanding      Depression: -  Provides group verbal and written instruction on the correlation between heart/lung disease and depressed mood, treatment options, and the stigmas associated with seeking treatment.   Exercise & Equipment Safety: - Individual verbal instruction and demonstration of equipment use and safety with use of the equipment.   Pulmonary Rehab from 05/03/2018 in [facility] Cardiac and Pulmonary Rehab  Date  03/14/18  Educator  [facility]  Instruction Review Code  1- Verbalizes Understanding      Infection Prevention: - Provides verbal and written material to individual with discussion of infection control including proper hand washing and proper equipment cleaning during exercise session.   Pulmonary Rehab from 05/03/2018 in [facility] Cardiac and Pulmonary Rehab  Date  03/14/18  Educator  [facility]  Instruction Review Code  1- Verbalizes Understanding      Falls Prevention: - Provides verbal and written material to individual with discussion of falls prevention and safety.   Pulmonary Rehab from 05/03/2018 in [facility] Cardiac and Pulmonary Rehab  Date  03/14/18  Educator  [facility]  Instruction Review Code  1- Verbalizes Understanding      Diabetes: - Individual verbal and written instruction to review signs/symptoms of diabetes, desired ranges of glucose level fasting, after meals and with exercise. Advice that pre and post exercise glucose checks will be done for 3 sessions at entry of program.   Pulmonary Rehab from 05/03/2018 in [facility] Cardiac and Pulmonary Rehab  Date  03/14/18  Educator  [facility]  Instruction Review Code  1- Verbalizes Understanding      Chronic Lung Diseases: - Group verbal and written instruction to  review updates, respiratory medications, advancements in procedures and treatments. Discuss use of supplemental oxygen including available portable oxygen systems, continuous and intermittent flow rates, concentrators, personal use and safety guidelines. Review proper use of inhaler and spacers. Provide informative websites for self-education.    Pulmonary Rehab from 05/03/2018 in [facility] Cardiac and Pulmonary Rehab  Date  03/31/18  Educator  [facility]  Instruction Review Code  1- Verbalizes Understanding      Energy Conservation: - Provide group verbal and written instruction for methods to conserve energy, plan and organize activities. Instruct on pacing techniques, use of adaptive equipment and posture/positioning to relieve shortness of breath.   Triggers and Exacerbations: - Group verbal and written instruction to review types of environmental triggers and ways to prevent exacerbations. Discuss weather changes, air quality and the benefits of nasal washing. Review warning signs and symptoms to help prevent infections. Discuss techniques for effective airway clearance, coughing, and vibrations.   AED/CPR: - Group verbal and written instruction with the use of models to demonstrate the basic use of the AED with the basic ABC's of resuscitation.   Anatomy and Physiology of the Lungs: - Group verbal and written instruction with the use of models to provide basic lung anatomy and physiology related to function, structure and complications of lung disease.   Pulmonary Rehab from 05/03/2018 in [facility] Cardiac and Pulmonary Rehab  Date  05/03/18  Educator  [facility]  Instruction Review Code  1- Verbalizes Understanding      Anatomy & Physiology of the Heart: - Group verbal and written instruction and models provide basic cardiac anatomy and physiology, with the coronary electrical and arterial systems. Review of Valvular disease and Heart Failure   Cardiac Medications: - Group verbal and written instruction  to review commonly prescribed medications for heart disease. Reviews the medication, class of the drug, and side effects.   Know Your Numbers and Risk Factors: -  Group verbal and written instruction about important numbers in your health.  Discussion of what are risk factors and how they play a role in the disease process.  Review of Cholesterol, Blood Pressure, Diabetes, and BMI and the role they play in your overall health.   Sleep Hygiene: -Provides group verbal and written instruction about how sleep can affect your health.  Define sleep hygiene, discuss sleep cycles and impact of sleep habits. Review good sleep hygiene tips.    Other: -Provides group and verbal instruction on various topics (see comments)    Knowledge Questionnaire Score: Knowledge Questionnaire Score - 03/14/18 1155      Knowledge Questionnaire Score   Pre Score  15/18   reviewed with patient       Core Components/Risk Factors/Patient Goals at Admission: Personal Goals and Risk Factors at Admission - 03/14/18 1156      Core Components/Risk Factors/Patient Goals on Admission    Weight Management  Yes;Weight Loss;Obesity    Intervention  Weight Management: Develop a combined nutrition and exercise program designed to reach desired caloric intake, while maintaining appropriate intake of nutrient and fiber, sodium and fats, and appropriate energy expenditure required for the weight goal.;Weight Management: Provide education and appropriate resources to help participant work on and attain dietary goals.;Weight Management/Obesity: Establish reasonable short term and long term weight goals.    Admit Weight  206 lb 12.8 oz (93.8 kg)    Goal Weight: Short Term  201 lb (91.2 kg)    Goal Weight: Long Term  160 lb (72.6 kg)    Expected Outcomes  Short Term: Continue to assess and modify interventions until short term weight is achieved;Long Term: Adherence to nutrition and physical activity/exercise program aimed toward  attainment of established weight goal;Weight Maintenance: Understanding of the daily nutrition guidelines, which includes 25-35% calories from fat, 7% or less cal from saturated fats, less than 219m cholesterol, less than 1.5gm of sodium, & 5 or more servings of fruits and vegetables daily;Weight Loss: Understanding of general recommendations for a balanced deficit meal plan, which promotes 1-2 lb weight loss per week and includes a negative energy balance of [phone] kcal/d    Improve shortness of breath with ADL's  Yes    Intervention  Provide education, individualized exercise plan and daily activity instruction to help decrease symptoms of SOB with activities of daily living.    Expected Outcomes  Short Term: Improve cardiorespiratory fitness to achieve a reduction of symptoms when performing ADLs;Long Term: Be able to perform more ADLs without symptoms or delay the onset of symptoms    Diabetes  Yes    Intervention  Provide education about signs/symptoms and action to take for hypo/hyperglycemia.;Provide education about proper nutrition, including hydration, and aerobic/resistive exercise prescription along with prescribed medications to achieve blood glucose in normal ranges: Fasting glucose 65-99 mg/dL    Expected Outcomes  Short Term: Participant verbalizes understanding of the signs/symptoms and immediate care of hyper/hypoglycemia, proper foot care and importance of medication, aerobic/resistive exercise and nutrition plan for blood glucose control.;Long Term: Attainment of HbA1C < 7%.    Heart Failure  Yes    Intervention  Provide a combined exercise and nutrition program that is supplemented with education, support and counseling about heart failure. Directed toward relieving symptoms such as shortness of breath, decreased exercise tolerance, and extremity edema.    Expected Outcomes  Improve functional capacity of life;Short term: Attendance in program 2-3 days a week with increased exercise  capacity. Reported lower sodium  intake. Reported increased fruit and vegetable intake. Reports medication compliance.;Short term: Daily weights obtained and reported for increase. Utilizing diuretic protocols set by physician.;Long term: Adoption of self-care skills and reduction of barriers for early signs and symptoms recognition and intervention leading to self-care maintenance.    Hypertension  Yes    Intervention  Provide education on lifestyle modifcations including regular physical activity/exercise, weight management, moderate sodium restriction and increased consumption of fresh fruit, vegetables, and low fat dairy, alcohol moderation, and smoking cessation.;Monitor prescription use compliance.    Expected Outcomes  Short Term: Continued assessment and intervention until BP is < 140/37m HG in hypertensive participants. < 130/845mHG in hypertensive participants with diabetes, heart failure or chronic kidney disease.;Long Term: Maintenance of blood pressure at goal levels.    Lipids  Yes    Intervention  Provide education and support for participant on nutrition & aerobic/resistive exercise along with prescribed medications to achieve LDL <7061mHDL >81m42m  Expected Outcomes  Short Term: Participant states understanding of desired cholesterol values and is compliant with medications prescribed. Participant is following exercise prescription and nutrition guidelines.;Long Term: Cholesterol controlled with medications as prescribed, with individualized exercise RX and with personalized nutrition plan. Value goals: LDL < 70mg48mL > 40 mg.       Core Components/Risk Factors/Patient Goals Review:  Goals and Risk Factor Review    Row Name 05/11/18 1352             Core Components/Risk Factors/Patient Goals Review   Personal Goals Review  Weight Management/Obesity;Improve shortness of breath with ADL's;Lipids;Hypertension;Diabetes       Review  Patient has lost some weight since the start of  the program. She states she her weight was 199 this morning. She is checking her blood pressure at home and was 124/74 this morning. Her oxygen was 96 percent with a HR or 88. She is checking her sugar at home and was 99 fasting. She is trying to stay busy while being confined to home.       Expected Outcomes  Short: Exercise and take medications as prescribed to help with overall health. Inform staff of any changes while [facility] is closed. Long: maintain exercise and medications independently to improve upon overall health          Core Components/Risk Factors/Patient Goals at Discharge (Final Review):  Goals and Risk Factor Review - 05/11/18 1352      Core Components/Risk Factors/Patient Goals Review   Personal Goals Review  Weight Management/Obesity;Improve shortness of breath with ADL's;Lipids;Hypertension;Diabetes    Review  Patient has lost some weight since the start of the program. She states she her weight was 199 this morning. She is checking her blood pressure at home and was 124/74 this morning. Her oxygen was 96 percent with a HR or 88. She is checking her sugar at home and was 99 fasting. She is trying to stay busy while being confined to home.    Expected Outcomes  Short: Exercise and take medications as prescribed to help with overall health. Inform staff of any changes while [facility] is closed. Long: maintain exercise and medications independently to improve upon overall health       ITP Comments: ITP Comments    Row Name 03/14/18 1220 04/03/18 0823 04/27/18 1248 05/01/18 0810 05/11/18 1347   ITP Comments  Medical Evaluation completed. Chart sent for review and changes to Dr. Mark Emily Filbertctor of [facility] [place] [facility]/13/2018  30  day review completed. ITP sent to Dr. Emily Filbert Director of [place]. Continue with ITP unless changes are made by physician.  LM on VM for patient to call back to Pulmonary Rehab.  Patient has been out due to being  sick. Unable to obtain all goals. 30 day review completed. ITP sent to Dr. Emily Filbert Director of [place]. Continue with ITP unless changes are made by physician.  Our program is currently closed due to COVID-19.  We are communicating with patient via phone calls and emails.   [facility] [place] Name 05/19/18 1050 05/29/18 1013         ITP Comments  Brenda Wu sent an email to let us know that she fell and broke her femur.  Called pt to check in.  She will be getting home health PT for now.  We will discharge her at this time while she recovers.  30 day review completed. ITP sent to Dr. Emily Filbert for review,changes as needed and signature. Continue with ITP unless changes directed by Dr. Sabra Heck.          Comments:

## 2018-05-30 DIAGNOSIS — I5042 Chronic combined systolic (congestive) and diastolic (congestive) heart failure: Secondary | ICD-10-CM

## 2018-08-06 ENCOUNTER — Other Ambulatory Visit: Payer: Self-pay | Admitting: Family

## 2018-08-22 ENCOUNTER — Other Ambulatory Visit: Payer: Self-pay

## 2018-08-22 ENCOUNTER — Ambulatory Visit: Payer: Medicare HMO | Attending: Family | Admitting: Family

## 2018-08-22 ENCOUNTER — Encounter: Payer: Self-pay | Admitting: Family

## 2018-08-22 VITALS — Wt 191.0 lb

## 2018-08-22 DIAGNOSIS — I10 Essential (primary) hypertension: Secondary | ICD-10-CM

## 2018-08-22 DIAGNOSIS — J449 Chronic obstructive pulmonary disease, unspecified: Secondary | ICD-10-CM

## 2018-08-22 DIAGNOSIS — E119 Type 2 diabetes mellitus without complications: Secondary | ICD-10-CM

## 2018-08-22 DIAGNOSIS — Z794 Long term (current) use of insulin: Secondary | ICD-10-CM

## 2018-08-22 DIAGNOSIS — I5042 Chronic combined systolic (congestive) and diastolic (congestive) heart failure: Secondary | ICD-10-CM

## 2018-08-22 MED ORDER — DILTIAZEM HCL ER COATED BEADS 180 MG PO CP24: 180.0000 mg | ORAL_CAPSULE | Freq: Every day | ORAL | 3 refills | Status: DC

## 2018-08-22 MED ORDER — TORSEMIDE 20 MG PO TABS: 40.0000 mg | ORAL_TABLET | Freq: Every day | ORAL | 3 refills | Status: DC

## 2018-08-22 NOTE — Progress Notes (Signed)
Virtual Visit via Telephone Note   Evaluation Performed:  Follow-up visit  This visit type was conducted due to national recommendations for restrictions regarding the COVID-19 Pandemic (e.g. social distancing).  This format is felt to be most appropriate for this patient at this time.  All issues noted in this document were discussed and addressed.  No physical exam was performed (except for noted visual exam findings with Video Visits).  Please refer to the patient's chart (MyChart message for video visits and phone note for telephone visits) for the patient's consent to telehealth for [facility]  Date:  08/22/2018   ID:  Brenda, Wu Apr 26, 1955, MRN [id number]  Patient Location:  [address] [place] [address]   Provider location:   [facility] [address] [place], [place] [address]  PCP:  Cheron Schaumann., MD  Cardiologist:  Julien Nordmann, MD  Electrophysiologist:  None   Chief Complaint:  fatigue  History of Present Illness:    Brenda Wu is a 63 y.o. female who presents via audio/video conferencing for a telehealth visit today.  Patient verified DOB and address.  The patient does not have symptoms concerning for COVID-19 infection (fever, chills, cough, or new SHORTNESS OF BREATH).   Patient reports minimal fatigue upon moderate exertion. She describes this as chronic in nature having been present for several years. She has associated right leg pain due to recent leg fracture. She denies any dizziness, swelling in her legs/ abdomen, palpitations, chest pain, shortness of breath, cough, difficulty sleeping or weight gain. PT is going to be starting tomorrow.   Prior CV studies:   The following studies were reviewed today:  Echo report from 01/06/18 reviewed and showed an EF of 40% along with mild MR.   Past Medical History:  Diagnosis Date  . Chronic combined systolic (congestive) and diastolic (congestive)  heart failure (HCC)    a. 10/2017 Echo: EF 45-50%, diff HK. Mild to mod MR, mildly dil LA. Mod dil RV w/ mildly reduced RV fxn.  [name] COPD (chronic obstructive pulmonary disease) (HCC)   . Depression   . Diabetes mellitus without complication (HCC)   . Hypercholesteremia   . Hypertension   . Morbid obesity (HCC)   . NICM (nonischemic cardiomyopathy) (HCC)   . NSTEMI (non-ST elevated myocardial infarction) (HCC)    a. 12/2017 -->Cath: nl cors. EF 45-50%.  . Seizures (HCC)   . Stroke (HCC)   . Tricuspid regurgitation    a. 10/2017 Echo: Mild to mod TR.   Past Surgical History:  Procedure Laterality Date  . ABDOMINAL HYSTERECTOMY    . LEFT HEART CATH AND CORONARY ANGIOGRAPHY N/A 01/09/2018   Procedure: LEFT HEART CATH AND CORONARY ANGIOGRAPHY;  Surgeon: Iran Ouch, MD;  Location: [facility] INVASIVE CV LAB;  Service: Cardiovascular;  Laterality: N/A;  . ORIF FEMUR FRACTURE Left 05/12/2018   Procedure: OPEN REDUCTION INTERNAL FIXATION (ORIF) DISTAL FEMUR FRACTURE;  Surgeon: Kennedy Bucker, MD;  Location: [facility] ORS;  Service: Orthopedics;  Laterality: Left;  . REPLACEMENT TOTAL KNEE       Current Meds  Medication Sig  . albuterol (PROVENTIL HFA;VENTOLIN HFA) 108 (90 Base) MCG/ACT inhaler Inhale 2 puffs into the lungs 4 (four) times daily as needed for wheezing or shortness of breath.   [name] albuterol (PROVENTIL) (2.5 MG/3ML) 0.083% nebulizer solution Inhale 2.5 mg into the lungs every 6 (six) hours as needed for wheezing or shortness of breath.   [name] aspirin EC  81 MG tablet Take 1 tablet (81 mg total) by mouth daily.  [name] atorvastatin (LIPITOR) 40 MG tablet Take 40 mg by mouth every evening.   . budesonide-formoterol (SYMBICORT) 160-4.5 MCG/ACT inhaler Inhale 2 puffs into the lungs 2 (two) times daily.  [name] diltiazem (CARDIZEM CD) 180 MG 24 hr capsule Take 180 mg by mouth daily.  [name] docusate sodium (COLACE) 100 MG capsule Take 1 capsule (100 mg total) by mouth 2 (two) times daily.  . ferrous sulfate 325  (65 FE) MG tablet Take 1 tablet (325 mg total) by mouth 2 (two) times daily with a meal. (Patient taking differently: Take 325 mg by mouth daily with breakfast. )  . glipiZIDE (GLUCOTROL) 10 MG tablet Take 10 mg by mouth 2 (two) times daily before a meal.   . insulin glargine (LANTUS) 100 UNIT/ML injection Inject 15 Units into the skin daily.  [name] lamoTRIgine (LAMICTAL) 200 MG tablet Take 200 mg by mouth 2 (two) times daily.   [name] losartan (COZAAR) 50 MG tablet Take 50 mg by mouth daily.  . Multiple Vitamin (MULTI-VITAMINS) TABS Take 1 tablet by mouth daily.  . niacin 500 MG tablet Take 500 mg by mouth daily.  . potassium chloride SA (K-DUR,KLOR-CON) 20 MEQ tablet Take 1 tablet (20 mEq total) by mouth daily.  . pregabalin (LYRICA) 75 MG capsule Take 75 mg by mouth 2 (two) times daily.  [name] senna (SENOKOT) 8.6 MG TABS tablet Take 1 tablet by mouth daily.   . sitaGLIPtin (JANUVIA) 50 MG tablet Take 50 mg by mouth daily.  [name] tiotropium (SPIRIVA) 18 MCG inhalation capsule Place 18 mcg into inhaler and inhale daily.  [name] topiramate (TOPAMAX) 200 MG tablet Take 200 mg by mouth 2 (two) times daily.  [name] torsemide (DEMADEX) 20 MG tablet Take 1 tablet (20 mg total) by mouth daily. Note dose change (Patient taking differently: Take 40 mg by mouth daily. Note dose change)     Allergies:   Metformin and Bismuth subsalicylate   Social History   Tobacco Use  . Smoking status: Former Smoker    Packs/day: 1.00    Years: 30.00    Pack years: 30.00    Types: Cigarettes    Quit date: 02/22/2002    Years since quitting: 16.5  . Smokeless tobacco: Never Used  Substance Use Topics  . Alcohol use: No  . Drug use: No     Family Hx: The patient's family history includes Asthma in her sister; Diabetes in her sister; Heart murmur in her sister; Hypertension in her father.  ROS:   Please see the history of present illness.     All other systems reviewed and are negative.   Labs/Other Tests and Data Reviewed:     Recent Labs: 01/06/2018: B Natriuretic Peptide 816.0; Magnesium 2.0; TSH 0.271 05/12/2018: ALT 17 05/15/2018: BUN 14; Creatinine, Ser 0.84; Potassium 4.3; Sodium 137 05/16/2018: Hemoglobin 7.8; Platelets 171   Recent Lipid Panel No results found for: CHOL, TRIG, HDL, CHOLHDL, LDLCALC, LDLDIRECT  Wt Readings from Last 3 Encounters:  08/22/18 191 lb (86.6 kg)  05/16/18 227 lb (103 kg)  05/09/18 206 lb 2 oz (93.5 kg)     Exam:    Vital Signs:  Wt 191 lb (86.6 kg) Comment: self-reported  BMI 32.79 kg/m    Well nourished, well developed female in no  acute distress.   ASSESSMENT & PLAN:    1.  Chronic heart failure with mildly reduced ejection fraction- - NYHA class II -  euvolemic today based on patient's description of symptoms - weighing daily and she says that her weight has declined some; reminded to call for an overnight weight gain of >2 pounds or a weekly weight gain of >5 pounds - not adding salt to her food. Reviewed the importance of closely following a 2000mg  sodium diet  - saw cardiology Mariah Milling([place]) 02/13/18 - EF mildly reduced so would not qualify for entresto - BNP 01/06/18 was 816.0  2: HTN- - not checking her BP at home - BMP done 04/25/2018 reviewed and showed sodium 136, potassium 3.6, creatinine 2.07 and GFR 29 - saw PCP Alwyn Ren([place]) 01/17/18  3: DM-  - glucose this morning at home was 96 - A1c 01/05/18 was 7.4%  4: COPD- - wearing oxygen at 2L around the clock although sometimes takes it off at home - wearing bipap at night - saw pulmonologist Su Monks([place]) 09/20/17  COVID-19 Education: The signs and symptoms of COVID-19 were discussed with the patient and how to seek care for testing (follow up with PCP or arrange E-visit).  The importance of social distancing was discussed today.  Patient Risk:   After full review of this patients clinical status, I feel that they are at least moderate risk at this time.  Time:   Today, I have spent 14 minutes with the  patient with telehealth technology discussing medications, diet and symptoms to report.     Medication Adjustments/Labs and Tests Ordered: Current medicines are reviewed at length with the patient today.  Concerns regarding medicines are outlined above.   Tests Ordered: No orders of the defined types were placed in this encounter.  Medication Changes: No orders of the defined types were placed in this encounter.   Disposition: Follow-up in 3 months or sooner for any questions/problems before then  Signed, Delma Freezeina A Ercel Pepitone, FNP  08/22/2018 11:21 AM    [facility]

## 2018-08-22 NOTE — Patient Instructions (Signed)
Continue weighing daily and call for an overnight weight gain of > 2 pounds or a weekly weight gain of >5 pounds. 

## 2018-10-04 ENCOUNTER — Other Ambulatory Visit: Payer: Self-pay | Admitting: Family Medicine

## 2018-10-04 DIAGNOSIS — Z1231 Encounter for screening mammogram for malignant neoplasm of breast: Secondary | ICD-10-CM

## 2018-11-01 NOTE — Progress Notes (Signed)
Patient's Name: Brenda Wu  MRN: [id number]  Referring Provider: Hessie Knows, MD  DOB: 1955-08-21  PCP: Theotis Burrow, MD  DOS: 11/02/2018  Note by: Gillis Santa, MD  Service setting: Ambulatory outpatient  Specialty: Interventional Pain Management  Location: [facility] (AMB) Pain Management Facility  Visit type: Initial Patient Evaluation  Patient type: New Patient   Primary Reason(s) for Visit: Encounter for initial evaluation of one or more chronic problems (new to examiner) potentially causing chronic pain, and posing a threat to normal musculoskeletal function. (Level of risk: High) CC: Knee Pain (left)  HPI  Brenda Wu is a 63 y.o. year old, female patient, who comes today to see [place] for the first time for an initial evaluation of her chronic pain. She has Asthma-chronic obstructive pulmonary disease overlap syndrome ([place]); Bilateral hearing loss; Cerebellar stroke ([place]); COPD with asthma ([place]); Generalized seizure disorder ([place]); History of cerebellar stroke; History of seizures; Hyperlipidemia associated with type 2 diabetes mellitus ([place]); Hypertension associated with diabetes ([place]); Hypoxemia; Morbid obesity due to excess calories ([place]); Obesity (BMI 30-39.9); Obstructive sleep apnea syndrome; OSA on CPAP; Paroxysmal atrial fibrillation ([place]); Primary osteoarthritis involving multiple joints; Pulmonary nodule, left; Severe episode of recurrent major depressive disorder, without psychotic features ([place]); Transient diplopia; COPD exacerbation ([place]); Chronic combined systolic and diastolic heart failure ([place]); HTN (hypertension); Diabetes ([place]); Non-ST elevation (NSTEMI) myocardial infarction [facility]); NICM (nonischemic cardiomyopathy) ([place]); and Fracture of prosthetic knee ([place]) on their problem list. Today she comes in for evaluation of her Knee Pain (left)  Pain Assessment: Location: Left Knee Radiating: right upper leg Onset: More than a month ago Duration: Chronic pain Quality:  Sharp Severity: 9 /10 (subjective, self-reported pain score)  Note: Reported level is compatible with observation.  Effect on ADL: difficulty with daily activities Timing: Constant Modifying factors: nothing BP: 105/62  HR: 79  Onset and Duration: Sudden and Present longer than 3 months Cause of pain: Trauma Severity: Getting worse, NAS-11 at its worse: 10/10, NAS-11 at its best: 5/10, NAS-11 now: 9/10 and NAS-11 on the average: 9/10 Timing: Not influenced by the time of the day, During activity or exercise and After activity or exercise Aggravating Factors: Motion and Walking Alleviating Factors: physical therapy Associated Problems: Pain that does not allow patient to sleep Quality of Pain: Sharp Previous Examinations or Tests: X-rays and Orthopedic evaluation Previous Treatments: Physical Therapy  The patient comes into the clinics today for the first time for a chronic pain management evaluation.   Patient is a pleasant 63 year old female who presents with a chief complaint of left knee pain.  Patient is status post left knee replacement in 2012 for left knee osteoarthritis.  In March 2020, patient sustained a fall which resulted in a left femur fracture requiring left ORIF.  She has had persistent and severe left knee pain that radiates to her anterior and superior thigh worse with weightbearing.  She has been working with physical therapy.  She states that the pain is severe and prevents her from doing ADLs.  She presents today in a wheelchair.  She is failed ibuprofen, Aleve, acetaminophen, ice.  She is currently on Lyrica.  She denies having done any nerve blocks in that area.  She is also tried hydrocodone in the past which she states was not effective.  Today I took the time to provide the patient with information regarding my pain practice. The patient was informed that my practice is divided into two sections: an interventional pain management section, as well as  a completely  separate and distinct medication management section. I explained that I have procedure days for my interventional therapies, and evaluation days for follow-ups and medication management. Because of the amount of documentation required during both, they are kept separated. This means that there is the possibility that she may be scheduled for a procedure on one day, and medication management the next. I have also informed her that because of staffing and facility limitations, I no longer take patients for medication management only. To illustrate the reasons for this, I gave the patient the example of surgeons, and how inappropriate it would be to refer a patient to his/her care, just to write for the post-surgical antibiotics on a surgery done by a different surgeon.   Because interventional pain management is my board-certified specialty, the patient was informed that joining my practice means that they are open to any and all interventional therapies. I made it clear that this does not mean that they will be forced to have any procedures done. What this means is that I believe interventional therapies to be essential part of the diagnosis and proper management of chronic pain conditions. Therefore, patients not interested in these interventional alternatives will be better served under the care of a different practitioner.  The patient was also made aware of my Comprehensive Pain Management Safety Guidelines where by joining my practice, they limit all of their nerve blocks and joint injections to those done by our practice, for as long as we are retained to manage their care.   Historic Controlled Substance Pharmacotherapy Review   06/26/2018  1   06/26/2018  Hydrocodone-Acetamin 5-325 MG  30.00  8 Th Gai   [id number]   [place] ([id number])   0  18.75 MME  Medicare   [place]    Medications: The patient did not bring the medication(s) to the appointment, as requested in our "New Patient  Package" Pharmacodynamics: Desired effects: Analgesia: The patient reports <50% benefit. Reported improvement in function: The patient reports medications have not provided significant benefit Clinically meaningful improvement in function (CMIF): Medication does not meet basic CMIF Perceived effectiveness: None Undesirable effects: Side-effects or Adverse reactions: None reported Historical Monitoring: The patient  reports no history of drug use. List of all UDS Test(s): Lab Results  Component Value Date   MDMA NONE DETECTED 01/05/2018   COCAINSCRNUR NONE DETECTED 01/05/2018   PCPSCRNUR NONE DETECTED 01/05/2018   THCU NONE DETECTED 01/05/2018   List of other Serum/Urine Drug Screening Test(s):  Lab Results  Component Value Date   COCAINSCRNUR NONE DETECTED 01/05/2018   THCU NONE DETECTED 01/05/2018   Historical Background Evaluation: [place] PMP: PDMP reviewed during this encounter. Six (6) year initial data search conducted.             [place] Department of public safety, offender search: Editor, commissioning Information) Non-contributory Risk Assessment Profile: Aberrant behavior: None observed or detected today Risk factors for fatal opioid overdose: None identified today Fatal overdose hazard ratio (HR): Calculation deferred Non-fatal overdose hazard ratio (HR): Calculation deferred Risk of opioid abuse or dependence: 0.7-3.0% with doses ? 36 MME/day and 6.1-26% with doses ? 120 MME/day. Substance use disorder (SUD) risk level: See below Personal History of Substance Abuse (SUD-Substance use disorder):  Alcohol: Negative  Illegal Drugs: Negative  Rx Drugs: Negative  ORT Risk Level calculation: Low Risk Opioid Risk Tool - 11/02/18 1010      Family History of Substance Abuse   Alcohol  Negative    Illegal Drugs  Negative    Rx Drugs  Negative      Personal History of Substance Abuse   Alcohol  Negative    Illegal Drugs  Negative    Rx Drugs  Negative      Age   Age between 65-45  years   No      History of Preadolescent Sexual Abuse   History of Preadolescent Sexual Abuse  Negative or Female      Psychological Disease   Psychological Disease  Negative    Depression  Negative      Total Score   Opioid Risk Tool Scoring  0    Opioid Risk Interpretation  Low Risk      ORT Scoring interpretation table:  Score <3 = Low Risk for SUD  Score between 4-7 = Moderate Risk for SUD  Score >8 = High Risk for Opioid Abuse   PHQ-2 Depression Scale:  Total score: 0  PHQ-2 Scoring interpretation table: (Score and probability of major depressive disorder)  Score 0 = No depression  Score 1 = 15.4% Probability  Score 2 = 21.1% Probability  Score 3 = 38.4% Probability  Score 4 = 45.5% Probability  Score 5 = 56.4% Probability  Score 6 = 78.6% Probability   PHQ-9 Depression Scale:  Total score: 0  PHQ-9 Scoring interpretation table:  Score 0-4 = No depression  Score 5-9 = Mild depression  Score 10-14 = Moderate depression  Score 15-19 = Moderately severe depression  Score 20-27 = Severe depression (2.4 times higher risk of SUD and 2.89 times higher risk of overuse)   Pharmacologic Plan: As per protocol, I have not taken over any controlled substance management, pending the results of ordered tests and/or consults.            Initial impression: Pending review of available data and ordered tests.  Meds   Current Outpatient Medications:  .  albuterol (PROVENTIL HFA;VENTOLIN HFA) 108 (90 Base) MCG/ACT inhaler, Inhale 2 puffs into the lungs 4 (four) times daily as needed for wheezing or shortness of breath. , Disp: , Rfl:  .  albuterol (PROVENTIL) (2.5 MG/3ML) 0.083% nebulizer solution, Inhale 2.5 mg into the lungs every 6 (six) hours as needed for wheezing or shortness of breath. , Disp: , Rfl:  .  aspirin EC 81 MG tablet, Take 1 tablet (81 mg total) by mouth daily., Disp: 32 tablet, Rfl: 0 .  atorvastatin (LIPITOR) 40 MG tablet, Take 40 mg by mouth every evening. , Disp:  , Rfl:  .  budesonide-formoterol (SYMBICORT) 160-4.5 MCG/ACT inhaler, Inhale 2 puffs into the lungs 2 (two) times daily., Disp: , Rfl:  .  diltiazem (CARDIZEM CD) 180 MG 24 hr capsule, Take 1 capsule (180 mg total) by mouth daily., Disp: 90 capsule, Rfl: 3 .  docusate sodium (COLACE) 100 MG capsule, Take 1 capsule (100 mg total) by mouth 2 (two) times daily., Disp: 10 capsule, Rfl: 0 .  glipiZIDE (GLUCOTROL) 10 MG tablet, Take 10 mg by mouth 2 (two) times daily before a meal. , Disp: , Rfl:  .  guaiFENesin (MUCINEX) 600 MG 12 hr tablet, Take 600 mg by mouth 2 (two) times daily as needed for cough. , Disp: , Rfl:  .  insulin glargine (LANTUS) 100 UNIT/ML injection, Inject 15 Units into the skin daily., Disp: , Rfl:  .  lamoTRIgine (LAMICTAL) 200 MG tablet, Take 200 mg by mouth 2 (two) times daily. , Disp: , Rfl:  .  losartan (COZAAR) 50 MG  tablet, Take 50 mg by mouth daily., Disp: , Rfl:  .  Multiple Vitamin (MULTI-VITAMINS) TABS, Take 1 tablet by mouth daily., Disp: , Rfl:  .  niacin 500 MG tablet, Take 500 mg by mouth daily., Disp: , Rfl:  .  potassium chloride SA (K-DUR,KLOR-CON) 20 MEQ tablet, Take 1 tablet (20 mEq total) by mouth daily., Disp: 30 tablet, Rfl: 2 .  pregabalin (LYRICA) 75 MG capsule, Take 75 mg by mouth 2 (two) times daily., Disp: , Rfl:  .  senna (SENOKOT) 8.6 MG TABS tablet, Take 1 tablet by mouth daily. , Disp: , Rfl:  .  sitaGLIPtin (JANUVIA) 50 MG tablet, Take 50 mg by mouth daily., Disp: , Rfl:  .  tiotropium (SPIRIVA) 18 MCG inhalation capsule, Place 18 mcg into inhaler and inhale daily., Disp: , Rfl:  .  topiramate (TOPAMAX) 200 MG tablet, Take 200 mg by mouth 2 (two) times daily., Disp: , Rfl:  .  torsemide (DEMADEX) 20 MG tablet, Take 2 tablets (40 mg total) by mouth daily. Note dose change, Disp: 180 tablet, Rfl: 3 .  vitamin B-12 (CYANOCOBALAMIN) 500 MCG tablet, Take 500 mcg by mouth daily., Disp: , Rfl:  .  diclofenac (VOLTAREN) 75 MG EC tablet, Take 1 tablet (75 mg  total) by mouth 3 times/day as needed-between meals & bedtime., Disp: 60 tablet, Rfl: 2 .  tiZANidine (ZANAFLEX) 4 MG tablet, Take 1 tablet (4 mg total) by mouth 2 (two) times daily as needed for muscle spasms., Disp: 60 tablet, Rfl: 2  Imaging Review  Hip-L DG 2-3 views:  Results for orders placed during the hospital encounter of 05/12/18  DG Hip Unilat W or Wo Pelvis 2-3 Views Left   Narrative CLINICAL DATA:  Left hip pain after mechanical fall  EXAM: DG HIP (WITH OR WITHOUT PELVIS) 2-3V LEFT  COMPARISON:  None.  FINDINGS: Pelvis radiograph is limited by overlapping EKG leads. There is no evidence of hip or pelvic ring fracture. No hip dislocation.  IMPRESSION: Negative.   Electronically Signed   By: Monte Fantasia M.D.   On: 05/12/2018 06:30     Results for orders placed during the hospital encounter of 05/12/18  DG Knee 1-2 Views Left   Narrative CLINICAL DATA:  Status post ORIF of distal left femoral fracture  EXAM: LEFT KNEE - 1-2 VIEW  COMPARISON:  Film from earlier in the same day.  FINDINGS: Knee replacement is again identified. New lateral fixation sideplate is noted along the distal femur. Fracture fragments have been reduced when compared with the prior exam.  IMPRESSION: Status post ORIF of distal left femoral fracture   Electronically Signed   By: Inez Catalina M.D.   On: 05/12/2018 16:11     Hand Imaging: Hand-R DG Complete:  Results for orders placed during the hospital encounter of 11/23/17  DG Hand Complete Right   Narrative CLINICAL DATA:  Hand pain post fall question fracture  EXAM: RIGHT HAND - COMPLETE 3+ VIEW  COMPARISON:  None  FINDINGS: Osseous mineralization normal.  Joint spaces preserved.  No acute fracture, dislocation, or bone destruction.  Question dorsal soft tissue swelling at the distal forearm.  IMPRESSION: No acute osseous abnormalities.   Electronically Signed   By: Lavonia Dana M.D.   On: 11/23/2017  13:56     Complexity Note: Imaging results reviewed. Results shared with Brenda Wu, using Layman's terms.  ROS  Cardiovascular: Heart attack ( Date: unknown) and Weak heart (CHF) Pulmonary or Respiratory: Smoking Neurological: Seizures (Epilepsy) and Stroke (Residual deficits or weakness: none) Review of Past Neurological Studies:  Results for orders placed or performed during the hospital encounter of 11/23/17  CT Head Wo Contrast   Narrative   CLINICAL DATA:  Minor head trauma with fall.  EXAM: CT HEAD WITHOUT CONTRAST  TECHNIQUE: Contiguous axial images were obtained from the base of the skull through the vertex without intravenous contrast.  COMPARISON:  Brain MRI 06/11/2015  FINDINGS: Brain: No evidence of acute infarction, hemorrhage, hydrocephalus, extra-axial collection or mass lesion/mass effect. Partially empty sella, also seen in 2017, nonspecific in isolation.  Vascular: Mild arterial calcification.  Skull: No acute finding  Sinuses/Orbits: Negative  IMPRESSION: No acute finding.   Electronically Signed   By: Monte Fantasia M.D.   On: 11/23/2017 14:45    Psychological-Psychiatric: No reported psychological or psychiatric signs or symptoms such as difficulty sleeping, anxiety, depression, delusions or hallucinations (schizophrenial), mood swings (bipolar disorders) or suicidal ideations or attempts Gastrointestinal: No reported gastrointestinal signs or symptoms such as vomiting or evacuating blood, reflux, heartburn, alternating episodes of diarrhea and constipation, inflamed or scarred liver, or pancreas or irrregular and/or infrequent bowel movements Genitourinary: No reported renal or genitourinary signs or symptoms such as difficulty voiding or producing urine, peeing blood, non-functioning kidney, kidney stones, difficulty emptying the bladder, difficulty controlling the flow of urine, or chronic kidney  disease Hematological: No reported hematological signs or symptoms such as prolonged bleeding, low or poor functioning platelets, bruising or bleeding easily, hereditary bleeding problems, low energy levels due to low hemoglobin or being anemic Endocrine: High blood sugar requiring insulin (IDDM) Rheumatologic: No reported rheumatological signs and symptoms such as fatigue, joint pain, tenderness, swelling, redness, heat, stiffness, decreased range of motion, with or without associated rash Musculoskeletal: Negative for myasthenia gravis, muscular dystrophy, multiple sclerosis or malignant hyperthermia Work History: Disabled  Allergies  Brenda Wu is allergic to metformin and bismuth subsalicylate.  Laboratory Chemistry Profile   Screening Lab Results  Component Value Date   MRSAPCR NEGATIVE 05/12/2018   HIV Non Reactive 11/04/2017    Inflammation (CRP: Acute Phase) (ESR: Chronic Phase) No results found for: CRP, ESRSEDRATE, LATICACIDVEN                       Rheumatology No results found for: RF, ANA, LABURIC, URICUR, LYMEIGGIGMAB, LYMEABIGMQN, HLAB27                      Renal Lab Results  Component Value Date   BUN 14 05/15/2018   CREATININE 0.84 05/15/2018   BCR 21 01/26/2018   GFRAA >60 05/15/2018   GFRNONAA >60 05/15/2018                             Hepatic Lab Results  Component Value Date   AST 31 05/12/2018   ALT 17 05/12/2018   ALBUMIN 3.8 05/12/2018   ALKPHOS 73 05/12/2018   LIPASE 35 04/25/2018                        Electrolytes Lab Results  Component Value Date   NA 137 05/15/2018   K 4.3 05/15/2018   CL 104 05/15/2018   CALCIUM 8.6 (L) 05/15/2018   MG 2.0 01/06/2018   PHOS 4.7 (H) 11/24/2017  Neuropathy Lab Results  Component Value Date   HGBA1C 9.0 (H) 05/13/2018   HIV Non Reactive 11/04/2017                        Coagulation Lab Results  Component Value Date   INR 1.0 05/12/2018   LABPROT 12.6 05/12/2018    APTT 100 (H) 01/06/2018   PLT 171 05/16/2018                        Cardiovascular Lab Results  Component Value Date   BNP 816.0 (H) 01/06/2018   TROPONINI 1.50 (HH) 01/06/2018   HGB 7.8 (L) 05/16/2018   HCT 25.9 (L) 05/16/2018                         ID Lab Results  Component Value Date   HIV Non Reactive 11/04/2017   MRSAPCR NEGATIVE 05/12/2018     Endocrine Lab Results  Component Value Date   TSH 0.271 (L) 01/06/2018   FREET4 0.95 01/06/2018                        Note: Lab results reviewed.  PFSH  Drug: Brenda Wu  reports no history of drug use. Alcohol:  reports no history of alcohol use. Tobacco:  reports that she quit smoking about 16 years ago. Her smoking use included cigarettes. She has a 30.00 pack-year smoking history. She has never used smokeless tobacco. Medical:  has a past medical history of Chronic combined systolic (congestive) and diastolic (congestive) heart failure ([place]), COPD (chronic obstructive pulmonary disease) ([place]), Depression, Diabetes mellitus without complication ([place]), Hypercholesteremia, Hypertension, Morbid obesity ([place]), NICM (nonischemic cardiomyopathy) ([place]), NSTEMI (non-ST elevated myocardial infarction) ([place]), Seizures ([place]), Stroke ([place]), and Tricuspid regurgitation. Family: family history includes Asthma in her sister; Diabetes in her sister; Heart murmur in her sister; Hypertension in her father.  Past Surgical History:  Procedure Laterality Date  . ABDOMINAL HYSTERECTOMY    . LEFT HEART CATH AND CORONARY ANGIOGRAPHY N/A 01/09/2018   Procedure: LEFT HEART CATH AND CORONARY ANGIOGRAPHY;  Surgeon: Wellington Hampshire, MD;  Location: [facility];  Service: Cardiovascular;  Laterality: N/A;  . ORIF FEMUR FRACTURE Left 05/12/2018   Procedure: OPEN REDUCTION INTERNAL FIXATION (ORIF) DISTAL FEMUR FRACTURE;  Surgeon: Hessie Knows, MD;  Location: [facility];  Service: Orthopedics;  Laterality: Left;  . REPLACEMENT TOTAL KNEE      Active Ambulatory Problems    Diagnosis Date Noted  . Asthma-chronic obstructive pulmonary disease overlap syndrome ([place]) 05/30/2015  . Bilateral hearing loss 01/22/2015  . Cerebellar stroke ([place]) 06/26/2015  . COPD with asthma ([place]) 05/30/2015  . Generalized seizure disorder ([place]) 01/22/2015  . History of cerebellar stroke 03/12/2016  . History of seizures 03/12/2016  . Hyperlipidemia associated with type 2 diabetes mellitus ([place]) 01/22/2015  . Hypertension associated with diabetes ([place]) 01/22/2015  . Hypoxemia 05/30/2015  . Morbid obesity due to excess calories ([place]) 01/22/2015  . Obesity (BMI 30-39.9) 03/12/2015  . Obstructive sleep apnea syndrome 05/30/2015  . OSA on CPAP 05/30/2015  . Paroxysmal atrial fibrillation ([place]) 10/17/2015  . Primary osteoarthritis involving multiple joints 01/22/2015  . Pulmonary nodule, left 01/05/2016  . Severe episode of recurrent major depressive disorder, without psychotic features ([place]) 01/22/2015  . Transient diplopia 06/09/2015  . COPD exacerbation ([place]) 02/07/2017  . Chronic combined systolic and diastolic heart failure ([place]) 12/12/2017  .  HTN (hypertension) 12/12/2017  . Diabetes ([place]) 12/12/2017  . Non-ST elevation (NSTEMI) myocardial infarction ([place])   . NICM (nonischemic cardiomyopathy) ([place]) 02/13/2018  . Fracture of prosthetic knee ([place]) 05/12/2018   Resolved Ambulatory Problems    Diagnosis Date Noted  . Acute on chronic respiratory failure with hypoxia and hypercapnia ([place]) 11/01/2016  . COPD with acute exacerbation ([place]) 03/12/2016  . Type 2 diabetes mellitus without complication, without long-term current use of insulin ([place]) 01/22/2015  . Respiratory failure ([place]) 11/04/2017  . Acute on chronic respiratory failure with hypoxia ([place]) 01/05/2018   Past Medical History:  Diagnosis Date  . Chronic combined systolic (congestive) and diastolic (congestive) heart failure ([place])   . COPD (chronic obstructive pulmonary disease)  ([place])   . Depression   . Diabetes mellitus without complication ([place])   . Hypercholesteremia   . Hypertension   . Morbid obesity ([place])   . NSTEMI (non-ST elevated myocardial infarction) ([place])   . Seizures ([place])   . Stroke ([place])   . Tricuspid regurgitation    Constitutional Exam  General appearance: Well nourished, well developed, and well hydrated. In no apparent acute distress Vitals:   11/02/18 1002  BP: 105/62  Pulse: 79  Resp: 16  SpO2: 100%  Weight: 193 lb (87.5 kg)  Height: _0  (1.626 m)   BMI Assessment: Estimated body mass index is 33.13 kg/m as calculated from the following:   Height as of this encounter: _1  (1.626 m).   Weight as of this encounter: 193 lb (87.5 kg).  BMI interpretation table: BMI level Category Range association with higher incidence of chronic pain  <18 kg/m2 Underweight   18.5-24.9 kg/m2 Ideal body weight   25-29.9 kg/m2 Overweight Increased incidence by 20%  30-34.9 kg/m2 Obese (Class I) Increased incidence by 68%  35-39.9 kg/m2 Severe obesity (Class II) Increased incidence by 136%  >40 kg/m2 Extreme obesity (Class III) Increased incidence by 254%   Patient's current BMI Ideal Body weight  Body mass index is 33.13 kg/m. Ideal body weight: 54.7 kg (120 lb 9.5 oz) Adjusted ideal body weight: 67.8 kg (149 lb 8.9 oz)   BMI Readings from Last 4 Encounters:  11/02/18 33.13 kg/m  08/22/18 32.79 kg/m  05/16/18 38.96 kg/m  05/09/18 35.38 kg/m   Wt Readings from Last 4 Encounters:  11/02/18 193 lb (87.5 kg)  08/22/18 191 lb (86.6 kg)  05/16/18 227 lb (103 kg)  05/09/18 206 lb 2 oz (93.5 kg)  Psych/Mental status: Alert, oriented x 3 (person, place, & time)       Eyes: PERLA Respiratory: No evidence of acute respiratory distress  Cervical Spine Area Exam  Skin & Axial Inspection: No masses, redness, edema, swelling, or associated skin lesions Alignment: Symmetrical Functional ROM: Unrestricted ROM      Stability: No instability  detected Muscle Tone/Strength: Functionally intact. No obvious neuro-muscular anomalies detected. Sensory (Neurological): Unimpaired Palpation: No palpable anomalies              Upper Extremity (UE) Exam    Side: Right upper extremity  Side: Left upper extremity  Skin & Extremity Inspection: Skin color, temperature, and hair growth are WNL. No peripheral edema or cyanosis. No masses, redness, swelling, asymmetry, or associated skin lesions. No contractures.  Skin & Extremity Inspection: Skin color, temperature, and hair growth are WNL. No peripheral edema or cyanosis. No masses, redness, swelling, asymmetry, or associated skin lesions. No contractures.  Functional ROM: Unrestricted ROM  Functional ROM: Unrestricted ROM          Muscle Tone/Strength: Functionally intact. No obvious neuro-muscular anomalies detected.  Muscle Tone/Strength: Functionally intact. No obvious neuro-muscular anomalies detected.  Sensory (Neurological): Unimpaired          Sensory (Neurological): Unimpaired          Palpation: No palpable anomalies              Palpation: No palpable anomalies              Provocative Test(s):  Phalen's test: deferred Tinel's test: deferred Apley's scratch test (touch opposite shoulder):  Action 1 (Across chest): deferred Action 2 (Overhead): deferred Action 3 (LB reach): deferred   Provocative Test(s):  Phalen's test: deferred Tinel's test: deferred Apley's scratch test (touch opposite shoulder):  Action 1 (Across chest): deferred Action 2 (Overhead): deferred Action 3 (LB reach): deferred    Thoracic Spine Area Exam  Skin & Axial Inspection: No masses, redness, or swelling Alignment: Symmetrical Functional ROM: Unrestricted ROM Stability: No instability detected Muscle Tone/Strength: Functionally intact. No obvious neuro-muscular anomalies detected. Sensory (Neurological): Unimpaired Muscle strength & Tone: No palpable anomalies  Lumbar Spine Area Exam  Skin &  Axial Inspection: No masses, redness, or swelling Alignment: Symmetrical Functional ROM: Decreased ROM       Stability: No instability detected Muscle Tone/Strength: Functionally intact. No obvious neuro-muscular anomalies detected. Sensory (Neurological): Musculoskeletal pain pattern Palpation: No palpable anomalies         Gait & Posture Assessment  Ambulation: Patient came in today in a wheel chair Gait: Significantly limited. Dependent on assistive device to ambulate Posture: Painful   Lower Extremity Exam    Side: Right lower extremity  Side: Left lower extremity  Stability: No instability observed          Stability: Unstable          Skin & Extremity Inspection: Skin color, temperature, and hair growth are WNL. No peripheral edema or cyanosis. No masses, redness, swelling, asymmetry, or associated skin lesions. No contractures.  Skin & Extremity Inspection: Evidence of prior arthroplastic surgery, +edema along lateral aspect of knee  Functional ROM: Unrestricted ROM                  Functional ROM: Pain restricted ROM for hip joint          Muscle Tone/Strength: Functionally intact. No obvious neuro-muscular anomalies detected.  Muscle Tone/Strength: Functionally intact. No obvious neuro-muscular anomalies detected.  Sensory (Neurological): Unimpaired        Sensory (Neurological): Arthropathic arthralgia and neuralgia        DTR: Patellar: 0: absent Achilles: 0: absent Plantar: deferred today  DTR: Patellar: 0: absent Achilles: 0: absent Plantar: deferred today  Palpation: No palpable anomalies  Palpation: No palpable anomalies   Assessment  Primary Diagnosis & Pertinent Problem List: The primary encounter diagnosis was Primary osteoarthritis of left knee. Diagnoses of History of open reduction and internal fixation (ORIF) procedure (LEFT FEMUR), Fracture of prosthetic knee, sequela, Paroxysmal atrial fibrillation ([place]), Obstructive sleep apnea syndrome, Generalized seizure  disorder ([place]), Primary osteoarthritis involving multiple joints, Severe episode of recurrent major depressive disorder, without psychotic features ([place]), and Chronic pain syndrome were also pertinent to this visit.  Visit Diagnosis (New problems to examiner): 1. Primary osteoarthritis of left knee   2. History of open reduction and internal fixation (ORIF) procedure (LEFT FEMUR)   3. Fracture of prosthetic knee, sequela   4. Paroxysmal atrial fibrillation ([facility])  5. Obstructive sleep apnea syndrome   6. Generalized seizure disorder ([place])   7. Primary osteoarthritis involving multiple joints   8. Severe episode of recurrent major depressive disorder, without psychotic features ([place])   9. Chronic pain syndrome   I had extensive discussion with the patient about the goals of pain management.  We discussed nonpharmacological approaches to pain management that include physical therapy, dieting, sleep hygiene, psychotherapy, interventional therapy.  We discussed the importance of understanding the type of pain including neuropathic, nociceptive, centralized.  I also stressed the importance of multimodal analgesia with an emphasis on nondrug modalities including self management, behavioral health support and physical therapy.  We discussed the importance of physical therapy and how a individualized physical therapy and occupational therapy program tailored to patient limitations can be helpful at improving physical function. We also discussed the importance of insomnia and disrupted sleep and how improved sleep hygiene and cognitive therapy could be helpful.  Psychotherapy including CBT, mind-body therapies, pain coping strategies can be helpful for patients whose pain impacts mood, sleep, quality of life, relationships with others.  We discussed avoiding benzodiazepines.  I also had an extensive discussion with the patient about interventional therapies which is my expertise and how these could be  incorporated into an effective multimodal pain management plan.  General Recommendations: The pain condition that the patient suffers from is best treated with a multidisciplinary approach that involves an increase in physical activity to prevent de-conditioning and worsening of the pain cycle, as well as psychological counseling (formal and/or informal) to address the co-morbid psychological affects of pain. Treatment will often involve judicious use of pain medications and interventional procedures to decrease the pain, allowing the patient to participate in the physical activity that will ultimately produce long-lasting pain reductions. The goal of the multidisciplinary approach is to return the patient to a higher level of overall function and to restore their ability to perform activities of daily living.  -Diclofenac, Zanaflex as below -Recommend ice, elevation, and brace to help with swelling and stability -Avoid opioid therapy -Consider diagnostic left genicular nerve block and possible RFA  Plan of Care (Initial workup plan)  Note: Brenda Wu was reminded that as per protocol, today's visit has been an evaluation only. We have not taken over the patient's controlled substance management.  Pharmacotherapy (current): Medications ordered:  Meds ordered this encounter  Medications  . diclofenac (VOLTAREN) 75 MG EC tablet    Sig: Take 1 tablet (75 mg total) by mouth 3 times/day as needed-between meals & bedtime.    Dispense:  60 tablet    Refill:  2  . tiZANidine (ZANAFLEX) 4 MG tablet    Sig: Take 1 tablet (4 mg total) by mouth 2 (two) times daily as needed for muscle spasms.    Dispense:  60 tablet    Refill:  2   Medications administered during this visit: Khadejah Gest had no medications administered during this visit.   Pharmacological management options:  Opioid Analgesics: The patient was informed that there is no guarantee that she would be a candidate for opioid  analgesics. The decision will be made following CDC guidelines. This decision will be based on the results of diagnostic studies, as well as Brenda Wu's risk profile. Consider Tramadol, [place]rans  Membrane stabilizer: Adequate regimen continue Lyrica, consider Gabapentin in future  Muscle relaxant: Zanaflex today  NSAID: Diclofenac today  Other analgesic(s): To be determined at a later time   Interventional management options: Brenda Wu was informed  that there is no guarantee that she would be a candidate for interventional therapies. The decision will be based on the results of diagnostic studies, as well as Brenda Wu's risk profile.  Procedure(s) under consideration:  LEFT Genicular nerve block and possible RFA   Provider-requested follow-up: Return in about 3 months (around 02/01/2019) for Medication Management, virtual.  Future Appointments  Date Time Provider [place]  11/22/2018 11:00 AM Alisa Graff, FNP [facility] None  02/01/2019  8:45 AM Gillis Santa, MD [facility] None    Primary Care Physician: Theotis Burrow, MD Location: [facility] Note by: Gillis Santa, MD Date: 11/02/2018; Time: 1:01 PM  Note: This dictation was prepared with Dragon dictation. Any transcriptional errors that may result from this process are unintentional.

## 2018-11-02 ENCOUNTER — Ambulatory Visit
Payer: Medicare HMO | Attending: Student in an Organized Health Care Education/Training Program | Admitting: Student in an Organized Health Care Education/Training Program

## 2018-11-02 ENCOUNTER — Other Ambulatory Visit: Payer: Self-pay

## 2018-11-02 ENCOUNTER — Encounter: Payer: Self-pay | Admitting: Student in an Organized Health Care Education/Training Program

## 2018-11-02 VITALS — BP 105/62 | HR 79 | Resp 16 | Ht 64.0 in | Wt 193.0 lb

## 2018-11-02 DIAGNOSIS — Z9889 Other specified postprocedural states: Secondary | ICD-10-CM | POA: Diagnosis present

## 2018-11-02 DIAGNOSIS — M1712 Unilateral primary osteoarthritis, left knee: Secondary | ICD-10-CM | POA: Diagnosis not present

## 2018-11-02 DIAGNOSIS — M159 Polyosteoarthritis, unspecified: Secondary | ICD-10-CM

## 2018-11-02 DIAGNOSIS — M150 Primary generalized (osteo)arthritis: Secondary | ICD-10-CM | POA: Diagnosis present

## 2018-11-02 DIAGNOSIS — F332 Major depressive disorder, recurrent severe without psychotic features: Secondary | ICD-10-CM | POA: Diagnosis present

## 2018-11-02 DIAGNOSIS — T84019S Broken internal joint prosthesis, unspecified site, sequela: Secondary | ICD-10-CM | POA: Diagnosis present

## 2018-11-02 DIAGNOSIS — G40309 Generalized idiopathic epilepsy and epileptic syndromes, not intractable, without status epilepticus: Secondary | ICD-10-CM

## 2018-11-02 DIAGNOSIS — G894 Chronic pain syndrome: Secondary | ICD-10-CM | POA: Diagnosis present

## 2018-11-02 DIAGNOSIS — I480 Paroxysmal atrial fibrillation: Secondary | ICD-10-CM | POA: Diagnosis present

## 2018-11-02 DIAGNOSIS — G4733 Obstructive sleep apnea (adult) (pediatric): Secondary | ICD-10-CM | POA: Diagnosis present

## 2018-11-02 MED ORDER — DICLOFENAC SODIUM 75 MG PO TBEC: 75.0000 mg | DELAYED_RELEASE_TABLET | Freq: Two times a day (BID) | ORAL | 2 refills | Status: DC | PRN

## 2018-11-02 MED ORDER — TIZANIDINE HCL 4 MG PO TABS: 4.0000 mg | ORAL_TABLET | Freq: Two times a day (BID) | ORAL | 2 refills | Status: DC | PRN

## 2018-11-02 NOTE — Progress Notes (Signed)
Safety precautions to be maintained throughout the outpatient stay will include: orient to surroundings, keep bed in low position, maintain call bell within reach at all times, provide assistance with transfer out of bed and ambulation.  

## 2018-11-02 NOTE — Patient Instructions (Signed)
______________________________________________________________________________________________  Specialty Pain Scale  Introduction:  The pain scale used by our Pain Specialists is different from that used by non-specialist.  It has 11 levels with "0" being no pain.  As you will learn, levels "6" through "10" do not belong in an outpatient pain facility. Learn and use this scale.  General Information:  The pain score should reflect your current level of pain at the time you are being asked. (Amount of pain you have NOW). Unless asked about your worst pain, or average pain, we will always be referring to your current pain.  Definition:  ADL: (Activities of Daily Living) This refers to basic activities such as bathing, brushing your teeth, getting dressed, eating, getting out of bed or a chair, and using restroom.  Instructions: Always describe your pain, based on what it allows you to do. Read below.     Score Level Description  0 No Pain Pain Description: No pain.    Effects on ADLs: None. Able to do all chores without slowness, difficulty or impairment.    Physiologic Effects: None    Treatment Location: Outpatient Pain Facility    Level of independence: 100%; completely independent.      1 Mild Pain Description: Nagging, annoying    Effects on ADLs: Does not interfere with ability to eat, bathe, get dressed, use the toilet without assistance, move in and out of bed or chair, or control your bowel and/or bladder. Ability to do house work and to maintain gainful employment is retained. Able to do all chores, but with some degree of slowness, difficulty and/or impairment. One might take two times longer than normal to complete chores.    Physiologic Effects: Blood pressure and heart rate are seldom affected.    Treatment Location: Outpatient [facility] Facility    Level of independence: 90%; completely independent.      2 Mild to Pain Description: Nagging, annoying, + noticeable and  distracting.   Moderate ADL Effects: Frequent flare-ups. Possible to adapt and function. Still able to eat, bathe, get dressed, use the toilet, transfer in or out of bed/chair without assistance. Ability to do house work and to maintain gainful employment is still possible. Takes two times longer than normal to complete chores.    Physiologic Effects: Blood pressure and heart rate may be affected with flare-ups. Bowel and/or bladder control is still unaffected.     Treatment Location: Outpatient Pain Facility    Level of independence: 80%; usually completely independent.      3 Moderate Pain Description: Nagging, annoying, noticeable, + very distracting & difficult to ignore.    ADL Effects: ADL such as bathing, getting dressed, cooking, getting out of bed and getting up from a chair takes additional effort. Ability to do house work and to maintain gainful employment may be diminished. Faces more difficulty with some chores. One spends a large part of the day with chores and might take three to four times longer than normal.    Physiologic Effects: Baseline blood pressure and heart rate may become elevated.     Treatment Location: Outpatient [facility] Facility    Level of independence: 70%; mostly independent.      4 Moderate to Severe Pain Description: Nagging, annoying, noticeable, very distracting, + impossible to ignore and very difficult to concentrate.    ADL Effects: With effort, patients may still be able to manage work or participate in some social activities. Patients find relief in laying down and not moving. Can do most chores,  but exceedingly slowly and with much effort. Errors are possible during the chores.    Physiologic Effects: Signs of autonomic nervous system discharge are evident: dilated pupils (mydriasis); mild sweating (diaphoresis); sleep interference. Heart rate becomes elevated (>115 bpm). Diastolic blood pressure (lower number) rises above 100 mmHg.    Treatment Location:  Outpatient [facility] Facility    Level of independence: 60%; somewhat independent.      5 Severe Pain Description: Nagging, annoying, noticeable, very distracting, impossible to ignore and very difficult to concentrate. + Pain is intense and extremely unpleasant.    ADL Effects: Associated with frowning face and frequent crying. Pain overwhelms the senses.  Ability to do any activity or maintain social relationships becomes significantly limited. Conversation becomes difficult. Pacing back and forth is common, as getting into a comfortable position is nearly impossible. Pain wakes you up from deep sleep. Needs help with half of every chore. Everything is difficult to one.    Physiologic Effects: Physical signs will be obvious: pupillary dilation; increased sweating; goosebumps; brisk reflexes; cold, clammy hands and feet; nausea, vomiting or dry heaves; loss of appetite; significant sleep disturbance with inability to fall asleep or to remain asleep. When persistent, significant weight loss is observed due to the complete loss of appetite and sleep deprivation.  Blood pressure and heart rate becomes significantly elevated. Caution: If elevated blood pressure triggers a pounding headache associated with blurred vision, then the patient should immediately seek attention at an urgent or emergency care unit, as these may be signs of an impending stroke.    Treatment Location: Outpatient Pain Facility    Level of independence: 50%; mostly dependent.      6 Distressing Pain Description: Severe    ADL Effects: Extremely limiting. Communication becomes difficult and requires great effort. Assistance to reach the emergency department may be required. Can assist with chores, and can complete some alone.    Physiologic Effects: Facial flushing and profuse sweating along with potentially dangerous increases in heart rate and blood pressure will be evident.    Level of independence: 40%; very dependent.    Treatment  Location: Inpatient Emergency Department. Requires emergency care and should not be seen or managed at an outpatient pain management facility.     NOTE: This is an emergency department pain level. This level may not be treated in an outpatient pain facility. Patients will be transferred to emergency department for management. Patients declaring this level of pain, while not meeting the above description, will be labeled as "symptom exaggeration".      7 Disabling Pain Description: Extremely Severe    ADL Effects: Self-care is very difficult. Assistance is required to transport, or use restroom. Assistance to reach the emergency department will be required. Tasks requiring coordination, such as bathing and getting dressed become very difficult. With help, can start chores. One can also complete few chores with effort and help.    Physiologic Effects:     Level of independence: 30%; very dependent.    Treatment Location: Inpatient Emergency Department. Requires emergency care and should not be seen or managed at an outpatient pain management facility.     NOTE: This is an emergency department pain level. This level may not be treated in an outpatient pain facility. Patients will be transferred to emergency department for management. Patients declaring this level of pain, while not meeting the above description, will be labeled as "symptom exaggeration".      8 Incapacitating Pain Description: Excruciating    ADL  Effects: Self-care is no longer possible. At this level, pain is disabling. The individual is unable to do even the most "basic" activities such as walking, eating, bathing, dressing, transferring to a bed, or toileting. Clearly. Can slightly help with chores, but cannot complete any alone.    Physiologic Effects: Fine motor skills are lost. It is difficult to think.    Level of independence: 20%; very dependent.    Treatment Location: Inpatient Emergency Department. Requires emergency care and  should not be seen or managed at an outpatient pain management facility.     NOTE: This is an emergency department pain level. This level may not be treated in an outpatient pain facility. Patients will be transferred to emergency department for management. Patients declaring this level of pain, while not meeting the above description, will be labeled as "symptom exaggeration".      9 Worst pain Pain Description: Intolerable   imaginable ADL Effects: Pain becomes incapacitating. Self-care is not possible. Pain is disabling. The individual is unable to do any of the "basic" activities (walking, eating, bathing, dressing, transferring to a bed, or toileting). Helpless and somewhat comatose.    Physiologic Effects: Thought processing is no longer possible. Difficult to remember your own name. Control of movement and coordination are lost.    Level of independence: 10%; fully dependent.    Treatment Location: Inpatient Emergency Department. Requires emergency care and should not be seen or managed at an outpatient pain management facility.     NOTE: This is an emergency department pain level. This level may not be treated in an outpatient pain facility. Patients will be transferred to emergency department for management. Patients declaring this level of pain, while not meeting the above description, will be labeled as "symptom exaggeration".      10 Patients will Pain Description: At this level, most patients pass out from pain.    pass out ADL Effects: Bedridden and helpless. Completely comatose. Patient is unconscious. No activity is possible. Medical emergency treatment required.    Physiologic Effects: When this level is reached, collapse of the autonomic nervous system occurs, leading to a sudden drop in blood pressure and heart rate. This in turn results in a temporary and dramatic drop in blood flow to the brain, leading to a loss of consciousness. Fainting is one of the body's self defense  mechanisms. Passing out puts the brain in a calmed state and causes it to shut down for a while, in order to begin the healing process.    Level of independence: 0%; fully dependent.    Treatment Location: EMS (Emergency Medical Services) are required. Requires emergency care and should not be seen or managed at an outpatient pain management facility.     NOTE: This is an emergency department pain level. This level may not be treated in an outpatient pain facility. Patients will be transferred to emergency department for management. Patients declaring this level of pain, while not meeting the above description, will be labeled as "symptom exaggeration".        Emergency Department Pain Levels (6-10/10)  Emergency Room Pain 6 Communication becomes difficult and requires great effort. Assistance to reach the emergency department may be required. Facial flushing and profuse sweating along with potentially dangerous increases in heart rate and blood pressure will be evident.   Distressing 7 Self-care is very difficult. Assistance is required to transport, or use restroom. Assistance to reach the emergency department will be required. Tasks requiring coordination, such as bathing and  getting dressed become very difficult.   Disabling 8 Self-care is no longer possible. At this level, pain is disabling. The individual is unable to do even the most "basic" activities such as walking, eating, bathing, dressing, transferring to a bed, or toileting. Fine motor skills are lost. It is difficult to think clearly.   Incapacitating 9 Pain becomes incapacitating. Thought processing is no longer possible. Difficult to remember your own name. Control of movement and coordination are lost.    Summary: 1. Refer to this scale when providing [place] with your pain level. 2. Be accurate and careful when reporting your pain level. This will help with your care. 3. Over-reporting your pain level will lead to loss of  credibility. 4. Even a level of 1/10 means that there is pain and will be treated at our facility. 5. High, inaccurate reporting will be documented as "Symptom Exaggeration", leading to loss of credibility and suspicions of possible secondary gains such as obtaining more narcotics, or wanting to appear disabled, for fraudulent reasons. 6. Only pain levels of 5 or below will be seen at our facility. 7. Pain levels of 6 and above will be sent to the Emergency Department and the appointment cancelled.  _______________________________________________________________________________________________     ______________________________________________________________________________________________  Specialty Pain Scale  Introduction:  The pain scale used by our Pain Specialists is different from that used by non-specialist.  It has 11 levels with "0" being no pain.  As you will learn, levels "6" through "10" do not belong in an outpatient pain facility. Learn and use this scale.  General Information:  The pain score should reflect your current level of pain at the time you are being asked. (Amount of pain you have NOW). Unless asked about your worst pain, or average pain, we will always be referring to your current pain.  Definition:  ADL: (Activities of Daily Living) This refers to basic activities such as bathing, brushing your teeth, getting dressed, eating, getting out of bed or a chair, and using restroom.  Instructions: Always describe your pain, based on what it allows you to do. Read below.     Score Level Description  0 No Pain Pain Description: No pain.    Effects on ADLs: None. Able to do all chores without slowness, difficulty or impairment.    Physiologic Effects: None    Treatment Location: Outpatient Pain Facility    Level of independence: 100%; completely independent.      1 Mild Pain Description: Nagging, annoying    Effects on ADLs: Does not interfere with ability to  eat, bathe, get dressed, use the toilet without assistance, move in and out of bed or chair, or control your bowel and/or bladder. Ability to do house work and to maintain gainful employment is retained. Able to do all chores, but with some degree of slowness, difficulty and/or impairment. One might take two times longer than normal to complete chores.    Physiologic Effects: Blood pressure and heart rate are seldom affected.    Treatment Location: [facility]    Level of independence: 90%; completely independent.      2 Mild to Pain Description: Nagging, annoying, + noticeable and distracting.   Moderate ADL Effects: Frequent flare-ups. Possible to adapt and function. Still able to eat, bathe, get dressed, use the toilet, transfer in or out of bed/chair without assistance. Ability to do house work and to maintain gainful employment is still possible. Takes two times longer than normal to complete chores.  Physiologic Effects: Blood pressure and heart rate may be affected with flare-ups. Bowel and/or bladder control is still unaffected.     Treatment Location: [facility]patient [facility] Facility    Level of independence: 80%; usually completely independent.      3 Moderate Pain Description: Nagging, annoying, noticeable, + very distracting & difficult to ignore.    ADL Effects: ADL such as bathing, getting dressed, cooking, getting out of bed and getting up from a chair takes additional effort. Ability to do house work and to maintain gainful employment may be diminished. Faces more difficulty with some chores. One spends a large part of the day with chores and might take three to four times longer than normal.    Physiologic Effects: Baseline blood pressure and heart rate may become elevated.     Treatment Location: [facility]patient [facility] Facility    Level of independence: 70%; mostly independent.      4 Moderate to Severe Pain Description: Nagging, annoying, noticeable, very distracting, + impossible  to ignore and very difficult to concentrate.    ADL Effects: With effort, patients may still be able to manage work or participate in some social activities. Patients find relief in laying down and not moving. Can do most chores, but exceedingly slowly and with much effort. Errors are possible during the chores.    Physiologic Effects: Signs of autonomic nervous system discharge are evident: dilated pupils (mydriasis); mild sweating (diaphoresis); sleep interference. Heart rate becomes elevated (>115 bpm). Diastolic blood pressure (lower number) rises above 100 mmHg.    Treatment Location: [facility] Facility    Level of independence: 60%; somewhat independent.      5 Severe Pain Description: Nagging, annoying, noticeable, very distracting, impossible to ignore and very difficult to concentrate. + Pain is intense and extremely unpleasant.    ADL Effects: Associated with frowning face and frequent crying. Pain overwhelms the senses.  Ability to do any activity or maintain social relationships becomes significantly limited. Conversation becomes difficult. Pacing back and forth is common, as getting into a comfortable position is nearly impossible. Pain wakes you up from deep sleep. Needs help with half of every chore. Everything is difficult to one.    Physiologic Effects: Physical signs will be obvious: pupillary dilation; increased sweating; goosebumps; brisk reflexes; cold, clammy hands and feet; nausea, vomiting or dry heaves; loss of appetite; significant sleep disturbance with inability to fall asleep or to remain asleep. When persistent, significant weight loss is observed due to the complete loss of appetite and sleep deprivation.  Blood pressure and heart rate becomes significantly elevated. Caution: If elevated blood pressure triggers a pounding headache associated with blurred vision, then the patient should immediately seek attention at an urgent or emergency care unit, as these may be signs  of an impending stroke.    Treatment Location: Outpatient Pain Facility    Level of independence: 50%; mostly dependent.      6 Distressing Pain Description: Severe    ADL Effects: Extremely limiting. Communication becomes difficult and requires great effort. Assistance to reach the emergency department may be required. Can assist with chores, and can complete some alone.    Physiologic Effects: Facial flushing and profuse sweating along with potentially dangerous increases in heart rate and blood pressure will be evident.    Level of independence: 40%; very dependent.    Treatment Location: Inpatient Emergency Department. Requires emergency care and should not be seen or managed at an outpatient pain management facility.     NOTE:  This is an emergency department pain level. This level may not be treated in an outpatient pain facility. Patients will be transferred to emergency department for management. Patients declaring this level of pain, while not meeting the above description, will be labeled as "symptom exaggeration".      7 Disabling Pain Description: Extremely Severe    ADL Effects: Self-care is very difficult. Assistance is required to transport, or use restroom. Assistance to reach the emergency department will be required. Tasks requiring coordination, such as bathing and getting dressed become very difficult. With help, can start chores. One can also complete few chores with effort and help.    Physiologic Effects:     Level of independence: 30%; very dependent.    Treatment Location: Inpatient Emergency Department. Requires emergency care and should not be seen or managed at an outpatient pain management facility.     NOTE: This is an emergency department pain level. This level may not be treated in an outpatient pain facility. Patients will be transferred to emergency department for management. Patients declaring this level of pain, while not meeting the above description, will be  labeled as "symptom exaggeration".      8 Incapacitating Pain Description: Excruciating    ADL Effects: Self-care is no longer possible. At this level, pain is disabling. The individual is unable to do even the most "basic" activities such as walking, eating, bathing, dressing, transferring to a bed, or toileting. Clearly. Can slightly help with chores, but cannot complete any alone.    Physiologic Effects: Fine motor skills are lost. It is difficult to think.    Level of independence: 20%; very dependent.    Treatment Location: Inpatient Emergency Department. Requires emergency care and should not be seen or managed at an outpatient pain management facility.     NOTE: This is an emergency department pain level. This level may not be treated in an outpatient pain facility. Patients will be transferred to emergency department for management. Patients declaring this level of pain, while not meeting the above description, will be labeled as "symptom exaggeration".      9 Worst pain Pain Description: Intolerable   imaginable ADL Effects: Pain becomes incapacitating. Self-care is not possible. Pain is disabling. The individual is unable to do any of the "basic" activities (walking, eating, bathing, dressing, transferring to a bed, or toileting). Helpless and somewhat comatose.    Physiologic Effects: Thought processing is no longer possible. Difficult to remember your own name. Control of movement and coordination are lost.    Level of independence: 10%; fully dependent.    Treatment Location: Inpatient Emergency Department. Requires emergency care and should not be seen or managed at an outpatient pain management facility.     NOTE: This is an emergency department pain level. This level may not be treated in an outpatient pain facility. Patients will be transferred to emergency department for management. Patients declaring this level of pain, while not meeting the above description, will be labeled as  "symptom exaggeration".      10 Patients will Pain Description: At this level, most patients pass out from pain.    pass out ADL Effects: Bedridden and helpless. Completely comatose. Patient is unconscious. No activity is possible. Medical emergency treatment required.    Physiologic Effects: When this level is reached, collapse of the autonomic nervous system occurs, leading to a sudden drop in blood pressure and heart rate. This in turn results in a temporary and dramatic drop in blood flow to  the brain, leading to a loss of consciousness. Fainting is one of the body's self defense mechanisms. Passing out puts the brain in a calmed state and causes it to shut down for a while, in order to begin the healing process.    Level of independence: 0%; fully dependent.    Treatment Location: EMS (Emergency Medical Services) are required. Requires emergency care and should not be seen or managed at an outpatient pain management facility.     NOTE: This is an emergency department pain level. This level may not be treated in an outpatient pain facility. Patients will be transferred to emergency department for management. Patients declaring this level of pain, while not meeting the above description, will be labeled as "symptom exaggeration".        Emergency Department Pain Levels (6-10/10)  Emergency Room Pain 6 Communication becomes difficult and requires great effort. Assistance to reach the emergency department may be required. Facial flushing and profuse sweating along with potentially dangerous increases in heart rate and blood pressure will be evident.   Distressing 7 Self-care is very difficult. Assistance is required to transport, or use restroom. Assistance to reach the emergency department will be required. Tasks requiring coordination, such as bathing and getting dressed become very difficult.   Disabling 8 Self-care is no longer possible. At this level, pain is disabling. The individual is unable  to do even the most "basic" activities such as walking, eating, bathing, dressing, transferring to a bed, or toileting. Fine motor skills are lost. It is difficult to think clearly.   Incapacitating 9 Pain becomes incapacitating. Thought processing is no longer possible. Difficult to remember your own name. Control of movement and coordination are lost.    Summary: 8. Refer to this scale when providing us with your pain level. 9. Be accurate and careful when reporting your pain level. This will help with your care. 10. Over-reporting your pain level will lead to loss of credibility. 11. Even a level of 1/10 means that there is pain and will be treated at our facility. 12. High, inaccurate reporting will be documented as "Symptom Exaggeration", leading to loss of credibility and suspicions of possible secondary gains such as obtaining more narcotics, or wanting to appear disabled, for fraudulent reasons. 13. Only pain levels of 5 or below will be seen at our facility. 14. Pain levels of 6 and above will be sent to the Emergency Department and the appointment cancelled.  _______________________________________________________________________________________________

## 2018-11-22 ENCOUNTER — Ambulatory Visit: Payer: Medicare HMO | Attending: Family | Admitting: Family

## 2018-11-22 ENCOUNTER — Other Ambulatory Visit: Payer: Self-pay

## 2018-11-22 ENCOUNTER — Encounter: Payer: Self-pay | Admitting: Family

## 2018-11-22 VITALS — BP 120/76 | HR 76 | Resp 18 | Ht 64.0 in | Wt 210.0 lb

## 2018-11-22 DIAGNOSIS — I428 Other cardiomyopathies: Secondary | ICD-10-CM | POA: Insufficient documentation

## 2018-11-22 DIAGNOSIS — E119 Type 2 diabetes mellitus without complications: Secondary | ICD-10-CM

## 2018-11-22 DIAGNOSIS — Z7951 Long term (current) use of inhaled steroids: Secondary | ICD-10-CM | POA: Insufficient documentation

## 2018-11-22 DIAGNOSIS — E7800 Pure hypercholesterolemia, unspecified: Secondary | ICD-10-CM | POA: Insufficient documentation

## 2018-11-22 DIAGNOSIS — R569 Unspecified convulsions: Secondary | ICD-10-CM | POA: Insufficient documentation

## 2018-11-22 DIAGNOSIS — I10 Essential (primary) hypertension: Secondary | ICD-10-CM

## 2018-11-22 DIAGNOSIS — Z8673 Personal history of transient ischemic attack (TIA), and cerebral infarction without residual deficits: Secondary | ICD-10-CM | POA: Insufficient documentation

## 2018-11-22 DIAGNOSIS — Z79899 Other long term (current) drug therapy: Secondary | ICD-10-CM | POA: Diagnosis not present

## 2018-11-22 DIAGNOSIS — Z87891 Personal history of nicotine dependence: Secondary | ICD-10-CM | POA: Insufficient documentation

## 2018-11-22 DIAGNOSIS — I252 Old myocardial infarction: Secondary | ICD-10-CM | POA: Diagnosis not present

## 2018-11-22 DIAGNOSIS — Z8249 Family history of ischemic heart disease and other diseases of the circulatory system: Secondary | ICD-10-CM | POA: Diagnosis not present

## 2018-11-22 DIAGNOSIS — I5042 Chronic combined systolic (congestive) and diastolic (congestive) heart failure: Secondary | ICD-10-CM | POA: Insufficient documentation

## 2018-11-22 DIAGNOSIS — J449 Chronic obstructive pulmonary disease, unspecified: Secondary | ICD-10-CM

## 2018-11-22 DIAGNOSIS — I5022 Chronic systolic (congestive) heart failure: Secondary | ICD-10-CM

## 2018-11-22 DIAGNOSIS — Z794 Long term (current) use of insulin: Secondary | ICD-10-CM | POA: Diagnosis not present

## 2018-11-22 DIAGNOSIS — Z7982 Long term (current) use of aspirin: Secondary | ICD-10-CM | POA: Insufficient documentation

## 2018-11-22 DIAGNOSIS — Z888 Allergy status to other drugs, medicaments and biological substances status: Secondary | ICD-10-CM | POA: Diagnosis not present

## 2018-11-22 DIAGNOSIS — I110 Hypertensive heart disease with heart failure: Secondary | ICD-10-CM | POA: Insufficient documentation

## 2018-11-22 NOTE — Progress Notes (Signed)
Patient ID: Brenda Wu, female    DOB: 1955/09/30, 63 y.o.   MRN: [id number]  Brenda Wu is a 63 y/o female with a history of DM, hyperlipidemia, , stroke, depression, HTN, seizures, COPD, previous tobacco use and chronic heart failure.   Echo report from 01/06/18 reviewed and showed an EF of 40% along with mild MR. Echo report from 11/05/17 reviewed and showed an EF of 45-50% along with mild/moderate MR/ TR and normal PA pressure.   Catheterization done 01/09/18 showed normal coronary arteries and an EF of 45-50%.  Was admitted 05/12/2018 for a fall and left femur fracture. She had surgery and was discharged after 4 days. Was in the ED 04/25/2018 due to diarrhea and dizziness. Gave some IV fluids due to dehydration. Abdominal CT was normal and she was released. Was in the ED 04/23/2018 due to viral gastroenteritis where she was treated and released.   She presents today for a follow-up visit with a chief complaint of occasional shortness of breath and clear productive cough. She states this happens when the seasons change with her COPD. She denies fatigue, chest pain, leg swelling, palpitations, abdominal distention, dizziness, light-headedness, and trouble sleeping. She is weighing daily and noticed her weight has gone up, however she said she has been eating "more cookies" recently. She does not add salt to her food and looks at the nutrition label. She walks around the house but is afraid to exercise because she is afraid she will fall again.   Past Medical History:  Diagnosis Date  . Chronic combined systolic (congestive) and diastolic (congestive) heart failure (HCC)    a. 10/2017 Echo: EF 45-50%, diff HK. Mild to mod MR, mildly dil LA. Mod dil RV w/ mildly reduced RV fxn.  [name] COPD (chronic obstructive pulmonary disease) (HCC)   . Depression   . Diabetes mellitus without complication (HCC)   . Hypercholesteremia   . Hypertension   . Morbid obesity (HCC)   . NICM (nonischemic  cardiomyopathy) (HCC)   . NSTEMI (non-ST elevated myocardial infarction) (HCC)    a. 12/2017 -->Cath: nl cors. EF 45-50%.  . Seizures (HCC)   . Stroke (HCC)   . Tricuspid regurgitation    a. 10/2017 Echo: Mild to mod TR.   Past Surgical History:  Procedure Laterality Date  . ABDOMINAL HYSTERECTOMY    . LEFT HEART CATH AND CORONARY ANGIOGRAPHY N/A 01/09/2018   Procedure: LEFT HEART CATH AND CORONARY ANGIOGRAPHY;  Surgeon: Iran Ouch, MD;  Location: [facility];  Service: Cardiovascular;  Laterality: N/A;  . ORIF FEMUR FRACTURE Left 05/12/2018   Procedure: OPEN REDUCTION INTERNAL FIXATION (ORIF) DISTAL FEMUR FRACTURE;  Surgeon: Kennedy Bucker, MD;  Location: [facility];  Service: Orthopedics;  Laterality: Left;  . REPLACEMENT TOTAL KNEE     Family History  Problem Relation Age of Onset  . Hypertension Father   . Asthma Sister   . Heart murmur Sister   . Diabetes Sister    Social History   Tobacco Use  . Smoking status: Former Smoker    Packs/day: 1.00    Years: 30.00    Pack years: 30.00    Types: Cigarettes    Quit date: 02/22/2002    Years since quitting: 16.7  . Smokeless tobacco: Never Used  Substance Use Topics  . Alcohol use: No   Allergies  Allergen Reactions  . Metformin Other (See Comments)    Other reaction(s): Other (See Comments) Constipation, dry mouth, dizziness  . Bismuth Subsalicylate Rash  Pepto Bismol   Prior to Admission medications   Medication Sig Start Date End Date Taking? Authorizing Provider  albuterol (PROVENTIL) (2.5 MG/3ML) 0.083% nebulizer solution Inhale 2.5 mg into the lungs every 6 (six) hours as needed for wheezing or shortness of breath.  10/01/16  Yes [provider]  aspirin EC 81 MG tablet Take 1 tablet (81 mg total) by mouth daily. 02/13/18  Yes [name], MD  atorvastatin (LIPITOR) 40 MG tablet Take 40 mg by mouth every evening.    Yes [provider]  budesonide-formoterol (SYMBICORT) 160-4.5  MCG/ACT inhaler Inhale 2 puffs into the lungs 2 (two) times daily. 05/22/15  Yes [provider]  diltiazem (CARDIZEM CD) 180 MG 24 hr capsule Take 180 mg by mouth daily.   Yes [provider]  ferrous sulfate 325 (65 FE) MG tablet Take 325 mg by mouth daily with breakfast.   Yes [provider]  glipiZIDE (GLUCOTROL) 10 MG tablet Take 10 mg by mouth 2 (two) times daily before a meal.    Yes [provider]  guaiFENesin (MUCINEX) 600 MG 12 hr tablet Take 600 mg by mouth 2 (two) times daily as needed.    Yes [provider]  Insulin Glargine (LANTUS SOLOSTAR) 100 UNIT/ML Solostar Pen Inject 15 Units into the skin daily.  01/21/17  Yes [provider]  lamoTRIgine (LAMICTAL) 200 MG tablet Take 200 mg by mouth 2 (two) times daily.    Yes [provider]  losartan (COZAAR) 50 MG tablet Take 50 mg by mouth daily.   Yes [provider]  Multiple Vitamin (MULTI-VITAMINS) TABS Take 1 tablet by mouth daily.   Yes [provider]  niacin 500 MG tablet Take 500 mg by mouth daily.   Yes [provider]  potassium chloride SA (K-DUR,KLOR-CON) 20 MEQ tablet Take 1 tablet (20 mEq total) by mouth daily. 02/12/17  Yes [name], MD  pregabalin (LYRICA) 75 MG capsule Take 75 mg by mouth 2 (two) times daily.   Yes [provider]  senna (SENOKOT) 8.6 MG TABS tablet Take 1 tablet by mouth daily.    Yes [provider]  tiotropium (SPIRIVA) 18 MCG inhalation capsule Place 18 mcg into inhaler and inhale daily.   Yes [provider]  topiramate (TOPAMAX) 200 MG tablet Take 200 mg by mouth 2 (two) times daily.   Yes [provider]  torsemide (DEMADEX) 20 MG tablet Take 2 tablets (40 mg total) by mouth daily. 01/26/18  Yes Creig Hines, NP  vitamin B-12 (CYANOCOBALAMIN) 500 MCG tablet Take 500 mcg by mouth daily.   Yes [provider]  albuterol (PROVENTIL HFA;VENTOLIN  HFA) 108 (90 Base) MCG/ACT inhaler Inhale 2 puffs into the lungs 4 (four) times daily as needed for wheezing or shortness of breath.  01/05/16   [provider]  sitaGLIPtin (JANUVIA) 50 MG tablet Take 50 mg by mouth daily.    [provider]    Review of Systems  Constitutional: Negative for appetite change, fatigue and fever.  HENT: Negative for congestion, postnasal drip and sore throat.   Eyes: Negative.   Respiratory: Positive for cough (productive - clear) and shortness of breath (off and on). Negative for wheezing.   Cardiovascular: Negative for chest pain, palpitations and leg swelling.  Gastrointestinal: Negative for abdominal distention and abdominal pain.  Endocrine: Negative.   Genitourinary: Negative.   Musculoskeletal: Positive for arthralgias (left shoulder). Negative for back pain and neck pain.  Skin: Negative.   Allergic/Immunologic: Negative.   Neurological: Negative for dizziness and light-headedness.  Hematological: Negative for adenopathy. Does not bruise/bleed easily.  Psychiatric/Behavioral: Negative for dysphoric mood and sleep disturbance (wearing oxygen @2L  and CPAP at night). The patient is not nervous/anxious.    Vitals:   11/22/18 1121  BP: 120/76  Pulse: 76  Resp: 18  SpO2: 94%  Weight: 210 lb (95.3 kg)  Height: 5\' 4"  (1.626 m)   Wt Readings from Last 3 Encounters:  11/22/18 210 lb (95.3 kg)  11/02/18 193 lb (87.5 kg)  08/22/18 191 lb (86.6 kg)   Lab Results  Component Value Date   CREATININE 0.84 05/15/2018   CREATININE 0.84 05/14/2018   CREATININE 1.07 (H) 05/13/2018    Physical Exam Vitals signs and nursing note reviewed.  Constitutional:      Appearance: She is well-developed.  HENT:     Head: Normocephalic and atraumatic.  Neck:     Musculoskeletal: Normal range of motion and neck supple.     Vascular: No JVD.  Cardiovascular:     Rate and Rhythm: Normal rate and regular rhythm.  Pulmonary:     Effort:  Pulmonary effort is normal. No respiratory distress.     Breath sounds: No wheezing or rales.  Abdominal:     General: There is no distension.     Palpations: Abdomen is soft.  Musculoskeletal:     Right lower leg: She exhibits no tenderness. No edema.     Left lower leg: She exhibits no tenderness. No edema.  Skin:    General: Skin is warm and dry.  Neurological:     Mental Status: She is oriented to person, place, and time.  Psychiatric:        Mood and Affect: Mood is not anxious.        Behavior: Behavior is not agitated.     Assessment & Plan:  1: Chronic heart failure with mildly reduced ejection fraction- - NYHA class II - euvolemic today - weighing daily and she says that her weight has declined Reviewed the importance of calling for an overnight weight gain of >2 pounds or a weekly weight gain of >5 pounds - weight up 4 pounds from last visit 6 months ago - not adding salt to her food. Reviewed the importance of closely following a 2000mg  sodium diet  - saw cardiology Rockey Situ) 02/13/18 - EF mildly reduced so would not qualify for entresto - has not received flu shot this year yet. She plans to soon. Encouraged good handwashing.  - BNP 01/06/18 was 816.0  2: HTN- - BP looks good today - BMP done 05/15/2018 reviewed and showed sodium 137, potassium 4.3, creatinine 0.84, GFR >60 - saw PCP Theodis Sato) August 2020  3: DM-  - glucose this morning at home was 99 - A1c 05/13/2018 was 9.0%  4: COPD- - wearing oxygen at 2L at night - wearing CPAP at night - saw pulmonologist Lanney Gins) 10/13/2018  Patient did not bring medications nor a list. Each medication was verbally reviewed with the patient and was encouraged to bring the bottles to every visit to confirm accuracy of the list.  Return in 6 months or sooner for any questions/problems before then.

## 2018-11-22 NOTE — Patient Instructions (Signed)
Continue weighing daily and call for an overnight weight gain of > 2 pounds or a weekly weight gain of >5 pounds. 

## 2019-01-11 ENCOUNTER — Other Ambulatory Visit: Payer: Self-pay | Admitting: Family Medicine

## 2019-01-11 DIAGNOSIS — M25511 Pain in right shoulder: Secondary | ICD-10-CM

## 2019-01-24 ENCOUNTER — Encounter: Payer: Self-pay | Admitting: Student in an Organized Health Care Education/Training Program

## 2019-01-25 ENCOUNTER — Encounter: Payer: Self-pay | Admitting: Student in an Organized Health Care Education/Training Program

## 2019-01-25 ENCOUNTER — Ambulatory Visit
Payer: Medicare HMO | Attending: Student in an Organized Health Care Education/Training Program | Admitting: Student in an Organized Health Care Education/Training Program

## 2019-01-25 ENCOUNTER — Other Ambulatory Visit: Payer: Self-pay

## 2019-01-25 DIAGNOSIS — G4733 Obstructive sleep apnea (adult) (pediatric): Secondary | ICD-10-CM

## 2019-01-25 DIAGNOSIS — T84019S Broken internal joint prosthesis, unspecified site, sequela: Secondary | ICD-10-CM | POA: Diagnosis not present

## 2019-01-25 DIAGNOSIS — I480 Paroxysmal atrial fibrillation: Secondary | ICD-10-CM

## 2019-01-25 DIAGNOSIS — M8949 Other hypertrophic osteoarthropathy, multiple sites: Secondary | ICD-10-CM

## 2019-01-25 DIAGNOSIS — M1712 Unilateral primary osteoarthritis, left knee: Secondary | ICD-10-CM | POA: Diagnosis not present

## 2019-01-25 DIAGNOSIS — G40309 Generalized idiopathic epilepsy and epileptic syndromes, not intractable, without status epilepticus: Secondary | ICD-10-CM

## 2019-01-25 DIAGNOSIS — Z9889 Other specified postprocedural states: Secondary | ICD-10-CM | POA: Diagnosis not present

## 2019-01-25 DIAGNOSIS — M150 Primary generalized (osteo)arthritis: Secondary | ICD-10-CM

## 2019-01-25 DIAGNOSIS — G894 Chronic pain syndrome: Secondary | ICD-10-CM

## 2019-01-25 DIAGNOSIS — M159 Polyosteoarthritis, unspecified: Secondary | ICD-10-CM

## 2019-01-25 MED ORDER — DICLOFENAC SODIUM 75 MG PO TBEC: 75.0000 mg | DELAYED_RELEASE_TABLET | Freq: Two times a day (BID) | ORAL | 5 refills | Status: DC | PRN

## 2019-01-25 MED ORDER — TIZANIDINE HCL 4 MG PO TABS: 4.0000 mg | ORAL_TABLET | Freq: Two times a day (BID) | ORAL | 5 refills | Status: DC | PRN

## 2019-01-25 NOTE — Progress Notes (Signed)
Pain Management Virtual Encounter Note - Virtual Visit via Video Conference Telehealth (real-time audio visits between healthcare provider and patient).   Patient's Phone No. & Preferred Pharmacy:  [phone] (home); [phone] (mobile); (Preferred) [phone] [email or web address]  [facility] [address], [place] - [address] [place] [address] Phone: [phone] Fax: [phone]  [facility] - [place], [place] - [address] [place] [address] Phone: [phone] Fax: [phone]    Pre-screening note:  Our staff contacted Brenda Wu and offered her an "in person", "face-to-face" appointment versus a telephone encounter. She indicated preferring the telephone encounter, at this time.   Reason for Virtual Visit: COVID-19*  Social distancing based on CDC and AMA recommendations.   I contacted Brenda Wu on 01/25/2019 via video conference.      I clearly identified myself as Edward Jolly, MD. I verified that I was speaking with the correct person using two identifiers (Name: Brenda Wu, and date of birth: January 20, 1956).  Advanced Informed Consent I sought verbal advanced consent from Brenda Wu for virtual visit interactions. I informed Brenda Wu of possible security and privacy concerns, risks, and limitations associated with providing "not-in-person" medical evaluation and management services. I also informed Brenda Wu of the availability of "in-person" appointments. Finally, I informed her that there would be a charge for the virtual visit and that she could be  personally, fully or partially, financially responsible for it. Brenda Wu expressed understanding and agreed to proceed.   Historic Elements   Brenda Wu is a 63 y.o. year old, female patient evaluated today after her last encounter by our practice on 11/02/2018. Brenda Wu  has a past medical history of  Chronic combined systolic (congestive) and diastolic (congestive) heart failure (HCC), COPD (chronic obstructive pulmonary disease) (HCC), Depression, Diabetes mellitus without complication (HCC), Hypercholesteremia, Hypertension, Morbid obesity (HCC), NICM (nonischemic cardiomyopathy) (HCC), NSTEMI (non-Brenda elevated myocardial infarction) (HCC), Seizures (HCC), Stroke (HCC), and Tricuspid regurgitation. She also  has a past surgical history that includes Replacement total knee; LEFT HEART CATH AND CORONARY ANGIOGRAPHY (N/A, 01/09/2018); Abdominal hysterectomy; and ORIF femur fracture (Left, 05/12/2018). Brenda Wu has a current medication list which includes the following prescription(s): albuterol, albuterol, aspirin ec, atorvastatin, budesonide-formoterol, diclofenac, diltiazem, docusate sodium, glipizide, guaifenesin, insulin glargine, lamotrigine, losartan, multi-vitamins, niacin, potassium chloride sa, pregabalin, senna, sitagliptin, tiotropium, tizanidine, topiramate, torsemide, and vitamin b-12. She  reports that she quit smoking about 16 years ago. Her smoking use included cigarettes. She has a 30.00 pack-year smoking history. She has never used smokeless tobacco. She reports that she does not drink alcohol or use drugs. Brenda Wu is allergic to metformin and bismuth subsalicylate.   HPI  Today, she is being contacted for medication management.  Patient continues to endorse left knee pain.  She believes that it is related to the hardware in her knee.  She has an upcoming appointment with Brenda Wu to discuss possible removal of hardware.  She states that she is having difficulty walking.  At her previous visit with me in September, we initiated diclofenac p.o. as well as tizanidine as needed.  She feels that these medications are helping to a certain extent.  She feels that her left knee pain is not any worse than it was in September.  I will refill her p.o. diclofenac and tizanidine as below.   We also discussed possible diagnostic left knee genicular nerve block.  She states that she would like to  defer this until she has discussed surgical plan with Brenda Wu.  This is reasonable.  I will place as needed order for left knee genicular nerve block if she would like to try this in the future.  Of note patient does not have a pain contract with the pain clinic.  She is not a candidate for chronic opioid therapy and we are focusing on nonopioid-based analgesics and interventional pain therapies.  Laboratory Chemistry Profile (12 mo)  Renal: 01/26/2018: BUN/Creatinine Ratio 21 05/15/2018: BUN 14; Creatinine, Ser 0.84  Lab Results  Component Value Date   GFRAA >60 05/15/2018   GFRNONAA >60 05/15/2018   Hepatic: 05/12/2018: Albumin 3.8 Lab Results  Component Value Date   AST 31 05/12/2018   ALT 17 05/12/2018   Other: No results found for requested labs within last 8760 hours. Note: Above Lab results reviewed.  Imaging  DG Knee 1-2 Views Left CLINICAL DATA:  Status post ORIF of distal left femoral fracture  EXAM: LEFT KNEE - 1-2 VIEW  COMPARISON:  Film from earlier in the same day.  FINDINGS: Knee replacement is again identified. New lateral fixation sideplate is noted along the distal femur. Fracture fragments have been reduced when compared with the prior exam.  IMPRESSION: Status post ORIF of distal left femoral fracture  Electronically Signed   By: Alcide Clever M.D.   On: 05/12/2018 16:11 DG C-Arm 1-60 Min-No Report Fluoroscopy was utilized by the requesting physician.  No radiographic  interpretation.  DG Chest Port 1 View CLINICAL DATA:  Preoperative for repair of right femur fracture.  EXAM: PORTABLE CHEST 1 VIEW  COMPARISON:  01/06/2018 chest radiograph.  FINDINGS: Stable cardiomediastinal silhouette with normal heart size. No pneumothorax. No pleural effusion. Lungs appear clear, with no acute consolidative airspace disease and no pulmonary  edema.  IMPRESSION: No active disease.  Electronically Signed   By: Delbert Phenix M.D.   On: 05/12/2018 08:10 DG FEMUR MIN 2 VIEWS LEFT CLINICAL DATA:  Leg pain after fall  EXAM: LEFT FEMUR 2 VIEWS  COMPARISON:  None.  FINDINGS: Primarily transverse distal femur fracture above a total knee arthroplasty. The fracture is displaced posteriorly by 50% with dorsal impaction. Osteopenia.  IMPRESSION: Displaced distal femur fracture above a total knee arthroplasty.  Electronically Signed   By: Marnee Spring M.D.   On: 05/12/2018 06:31 DG Hip Unilat W or Wo Pelvis 2-3 Views Left CLINICAL DATA:  Left hip pain after mechanical fall  EXAM: DG HIP (WITH OR WITHOUT PELVIS) 2-3V LEFT  COMPARISON:  None.  FINDINGS: Pelvis radiograph is limited by overlapping EKG leads. There is no evidence of hip or pelvic ring fracture. No hip dislocation.  IMPRESSION: Negative.  Electronically Signed   By: Marnee Spring M.D.   On: 05/12/2018 06:30   Assessment  The primary encounter diagnosis was Primary osteoarthritis of left knee. Diagnoses of History of open reduction and internal fixation (ORIF) procedure (LEFT FEMUR), Fracture of prosthetic knee, sequela, Paroxysmal atrial fibrillation (HCC), Obstructive sleep apnea syndrome, Generalized seizure disorder (HCC), Primary osteoarthritis involving multiple joints, and Chronic pain syndrome were also pertinent to this visit.  Plan of Care  I have changed Brenda Wu's diclofenac. I am also having her maintain her topiramate, atorvastatin, glipiZIDE, tiotropium, lamoTRIgine, budesonide-formoterol, albuterol, Multi-Vitamins, albuterol, vitamin B-12, pregabalin, potassium chloride SA, guaiFENesin, niacin, senna, aspirin EC, docusate sodium, sitaGLIPtin, losartan, insulin glargine, diltiazem, torsemide, and tiZANidine.  Pharmacotherapy (Medications Ordered): Meds ordered this encounter  Medications  . tiZANidine (ZANAFLEX) 4 MG tablet  Sig: Take 1 tablet (4 mg total) by mouth 2 (two) times daily as needed for muscle spasms.    Dispense:  60 tablet    Refill:  5  . diclofenac (VOLTAREN) 75 MG EC tablet    Sig: Take 1 tablet (75 mg total) by mouth 2 (two) times daily as needed.    Dispense:  60 tablet    Refill:  5   Orders:  Orders Placed This Encounter  Procedures  . Genicular NB (PRN)    For knee pain.    Standing Status:   Standing    Number of Occurrences:   1    Standing Expiration Date:   01/25/2020    Scheduling Instructions:     Side(s): LEFT KNEE     Level(s): Superior-Lateral, Superior-Medial, and Inferior-Medial Genicular Nerves     Sedation: UP TO PATIENT     TIMEFRAME: PRN procedure. (Brenda Wu will call when needed.)    Order Specific Question:   Where will this procedure be performed?    Answer:   [facility]   Follow-up plan:   Return in about 6 months (around 07/26/2019) for Medication Management.    Recent Visits Date Type Provider Dept  11/02/18 Office Visit Gillis Santa, MD [facility]  Showing recent visits within past 90 days and meeting all other requirements   Today's Visits Date Type Provider Dept  01/25/19 Office Visit Gillis Santa, MD [facility]  Showing today's visits and meeting all other requirements   Future Appointments No visits were found meeting these conditions.  Showing future appointments within next 90 days and meeting all other requirements   I discussed the assessment and treatment plan with the patient. The patient was provided an opportunity to ask questions and all were answered. The patient agreed with the plan and demonstrated an understanding of the instructions.  Patient advised to call back or seek an in-person evaluation if the symptoms or condition worsens.  Total duration of non-face-to-face encounter: 25 minutes.  Note by: Gillis Santa, MD Date: 01/25/2019; Time: 10:13 AM  Note: This dictation was prepared with  Dragon dictation. Any transcriptional errors that may result from this process are unintentional.  Disclaimer:  * Given the special circumstances of the COVID-19 pandemic, the federal government has announced that the Office for Civil Rights (OCR) will exercise its enforcement discretion and will not impose penalties on physicians using telehealth in the event of noncompliance with regulatory requirements under the [place] (HIPAA) in connection with the good faith provision of telehealth during the JGGEZ-66 national public health emergency. ([place])

## 2019-01-26 ENCOUNTER — Other Ambulatory Visit: Payer: Self-pay

## 2019-01-26 ENCOUNTER — Ambulatory Visit
Admission: RE | Admit: 2019-01-26 | Discharge: 2019-01-26 | Disposition: A | Discharge status: Home / Self Care | Payer: Medicare HMO | Source: Ambulatory Visit | Attending: Family Medicine | Admitting: Family Medicine

## 2019-01-26 DIAGNOSIS — M25511 Pain in right shoulder: Secondary | ICD-10-CM | POA: Diagnosis not present

## 2019-02-01 ENCOUNTER — Encounter: Payer: Medicare HMO | Admitting: Student in an Organized Health Care Education/Training Program

## 2019-02-26 ENCOUNTER — Other Ambulatory Visit: Payer: Self-pay | Admitting: Orthopedic Surgery

## 2019-03-05 ENCOUNTER — Other Ambulatory Visit: Payer: Self-pay

## 2019-03-05 ENCOUNTER — Encounter
Admission: RE | Admit: 2019-03-05 | Discharge: 2019-03-05 | Disposition: A | Discharge status: Home / Self Care | Payer: Medicare HMO | Source: Ambulatory Visit | Attending: Orthopedic Surgery | Admitting: Orthopedic Surgery

## 2019-03-05 HISTORY — DX: Sleep apnea, unspecified: G47.30

## 2019-03-05 NOTE — Patient Instructions (Addendum)
Your EKG & COVID test are scheduled on: Tuesday 03/06/19 @ 9:00 am.  Enter through the [facility] entrance.  Your procedure is scheduled on: Thursday 03/08/19 Report to Same Day Surgery 2nd floor [facility] Entrance-take elevator on left to 2nd floor.  Check in with surgery information desk.) To find out your arrival time, call [phone] 1:00-3:00 PM on Wednesday 03/07/19  Remember: Instructions that are not followed completely may result in serious medical risk, up to and including death, or upon the discretion of your surgeon and anesthesiologist your surgery may need to be rescheduled.   __x__ 1. Do not eat food (including mints, candies, chewing gum) after midnight the night before your procedure. You may drink water up to 2 hours before you are scheduled to arrive at the hospital for your procedure.  Do not drink anything within 2 hours of your scheduled arrival to the hospital.  __x__ 2. Finish your Gatorade drink (in bag) 2 hours before your scheduled arrival time on the day of surgery.  __x__ 3. No Alcohol or smoking for 24 hours before or after surgery.  __x__ 4. Notify your doctor if there is any change in your medical condition (cold, fever, infections).  __x__ 5. On the morning of surgery brush your teeth with toothpaste and water.  You may rinse your mouth with mouthwash if you wish.  Do not swallow any toothpaste or mouthwash.  Please read over the following fact sheets that you were given: [facility] Preparing for Surgery and/or MRSA Information   __x__ Use CHG Soap as directed on instruction sheet.  Do not wear jewelry, make-up, hairpins, clips or nail polish on the day of surgery. Do not wear lotions, powders, deodorant, or perfumes.  Do not shave below the neckline for 48 hours prior to surgery.  Do not bring valuables to the hospital.  [facility] is not responsible for any belongings or valuables.   Glasses, dentures or bridgework may not be worn  into surgery. For patients discharged on the day of surgery, you will NOT be permitted to drive yourself home.  You must have a responsible adult with you for 24 hours after surgery.  __x__ Take these medicines on the morning of surgery with a SMALL SIP OF WATER:  1. Cardizem (Diltiazem)  2. Lamotrigine (Lamictal)  3. Pregabalin (Lyrica)  4. Topiramate (Topamax)  5. Tizanidine (Zanaflex) if needed  __x__ Skip your Glipizide (Glucotrol), Losartan (Cozaar), Sitagliptin (Januvia), and Torsemide (Demadex) ONLY ON THE MORNING OF SURGERY.  You do not need to stop these ahead of time.  __x__ Use inhalers (Albuterol, Simbicort, Spiriva) on the day of surgery and bring them with you to the hospital.  __x__ Bring C-Pap/Bi-Pap machine to the hospital.    __x__ Take half of your normal insulin dose the night before surgery (7 Units Lantus) and take no insulin on the morning of surgery.   __x__ Follow recommendations from Cardiologist, Pulmonologist or PCP regarding stopping Aspirin, Coumadin, Plavix, Eliquis, Effient, Pradaxa, and Pletal.  __x__ STARTING TODAY: Do not take any Anti-inflammatories such as Aspirin, Diclofenac/Voltaren, Advil, Ibuprofen, Motrin, Aleve, Naproxen, Naprosyn, BC/Goodies powders or aspirin containing products. You may take Tylenol if needed.   __x__ STARTING TODAY: Do not take any over the counter herbal/nutritional supplements until after surgery. You CAN continue to take your Potassium, Vitamin B12, Niacin, and multivitamin.

## 2019-03-06 ENCOUNTER — Other Ambulatory Visit: Admission: RE | Admit: 2019-03-06 | Payer: Medicare HMO | Source: Ambulatory Visit

## 2019-03-06 ENCOUNTER — Other Ambulatory Visit
Admission: RE | Admit: 2019-03-06 | Discharge: 2019-03-06 | Disposition: A | Discharge status: Home / Self Care | Payer: Medicare HMO | Source: Ambulatory Visit | Attending: Orthopedic Surgery | Admitting: Orthopedic Surgery

## 2019-03-06 DIAGNOSIS — I10 Essential (primary) hypertension: Secondary | ICD-10-CM | POA: Diagnosis not present

## 2019-03-06 DIAGNOSIS — Z01818 Encounter for other preprocedural examination: Secondary | ICD-10-CM | POA: Diagnosis present

## 2019-03-06 DIAGNOSIS — T8484XA Pain due to internal orthopedic prosthetic devices, implants and grafts, initial encounter: Secondary | ICD-10-CM | POA: Diagnosis not present

## 2019-03-06 DIAGNOSIS — E119 Type 2 diabetes mellitus without complications: Secondary | ICD-10-CM | POA: Diagnosis not present

## 2019-03-06 DIAGNOSIS — R9431 Abnormal electrocardiogram [ECG] [EKG]: Secondary | ICD-10-CM | POA: Diagnosis not present

## 2019-03-06 DIAGNOSIS — Z20822 Contact with and (suspected) exposure to covid-19: Secondary | ICD-10-CM | POA: Diagnosis not present

## 2019-03-06 LAB — BASIC METABOLIC PANEL
Anion gap: 10 (ref 5–15)
BUN: 23 mg/dL (ref 8–23)
CO2: 30 mmol/L (ref 22–32)
Calcium: 9.2 mg/dL (ref 8.9–10.3)
Chloride: 101 mmol/L (ref 98–111)
Creatinine, Ser: 1.38 mg/dL — ABNORMAL HIGH (ref 0.44–1.00)
GFR calc Af Amer: 47 mL/min — ABNORMAL LOW (ref >60)
GFR calc non Af Amer: 41 mL/min — ABNORMAL LOW (ref >60)
Glucose, Bld: 189 mg/dL — ABNORMAL HIGH (ref 70–99)
Potassium: 3.7 mmol/L (ref 3.5–5.1)
Sodium: 141 mmol/L (ref 135–145)

## 2019-03-06 LAB — SARS CORONAVIRUS 2 (TAT 6-24 HRS): SARS Coronavirus 2: NEGATIVE

## 2019-03-06 LAB — CBC
HCT: 52.8 % — ABNORMAL HIGH (ref 36.0–46.0)
Hemoglobin: 15.9 g/dL — ABNORMAL HIGH (ref 12.0–15.0)
MCH: 29.1 pg (ref 26.0–34.0)
MCHC: 30.1 g/dL (ref 30.0–36.0)
MCV: 96.5 fL (ref 80.0–100.0)
Platelets: 219 10*3/uL (ref 150–400)
RBC: 5.47 MIL/uL — ABNORMAL HIGH (ref 3.87–5.11)
RDW: 13.9 % (ref 11.5–15.5)
WBC: 8.8 10*3/uL (ref 4.0–10.5)
nRBC: 0 % (ref 0.0–0.2)

## 2019-03-08 ENCOUNTER — Ambulatory Visit: Payer: Medicare HMO

## 2019-03-08 ENCOUNTER — Other Ambulatory Visit: Payer: Self-pay

## 2019-03-08 ENCOUNTER — Encounter: Payer: Self-pay | Admitting: *Deleted

## 2019-03-08 ENCOUNTER — Encounter
Admission: RE | Disposition: A | Discharge status: Home / Self Care | Payer: Self-pay | Source: Home / Self Care | Attending: Orthopedic Surgery

## 2019-03-08 ENCOUNTER — Encounter: Payer: Self-pay | Admitting: Orthopedic Surgery

## 2019-03-08 ENCOUNTER — Ambulatory Visit
Admission: RE | Admit: 2019-03-08 | Discharge: 2019-03-08 | Disposition: A | Discharge status: Home / Self Care | Payer: Medicare HMO | Attending: Orthopedic Surgery | Admitting: Orthopedic Surgery

## 2019-03-08 DIAGNOSIS — I480 Paroxysmal atrial fibrillation: Secondary | ICD-10-CM | POA: Diagnosis not present

## 2019-03-08 DIAGNOSIS — Z7982 Long term (current) use of aspirin: Secondary | ICD-10-CM | POA: Insufficient documentation

## 2019-03-08 DIAGNOSIS — E1169 Type 2 diabetes mellitus with other specified complication: Secondary | ICD-10-CM | POA: Insufficient documentation

## 2019-03-08 DIAGNOSIS — G4733 Obstructive sleep apnea (adult) (pediatric): Secondary | ICD-10-CM | POA: Diagnosis not present

## 2019-03-08 DIAGNOSIS — Y838 Other surgical procedures as the cause of abnormal reaction of the patient, or of later complication, without mention of misadventure at the time of the procedure: Secondary | ICD-10-CM | POA: Diagnosis not present

## 2019-03-08 DIAGNOSIS — Z96659 Presence of unspecified artificial knee joint: Secondary | ICD-10-CM | POA: Insufficient documentation

## 2019-03-08 DIAGNOSIS — Z794 Long term (current) use of insulin: Secondary | ICD-10-CM | POA: Diagnosis not present

## 2019-03-08 DIAGNOSIS — T8484XA Pain due to internal orthopedic prosthetic devices, implants and grafts, initial encounter: Secondary | ICD-10-CM | POA: Insufficient documentation

## 2019-03-08 DIAGNOSIS — G40409 Other generalized epilepsy and epileptic syndromes, not intractable, without status epilepticus: Secondary | ICD-10-CM | POA: Diagnosis not present

## 2019-03-08 DIAGNOSIS — I428 Other cardiomyopathies: Secondary | ICD-10-CM | POA: Insufficient documentation

## 2019-03-08 DIAGNOSIS — Z6837 Body mass index (BMI) 37.0-37.9, adult: Secondary | ICD-10-CM | POA: Insufficient documentation

## 2019-03-08 DIAGNOSIS — E1159 Type 2 diabetes mellitus with other circulatory complications: Secondary | ICD-10-CM | POA: Insufficient documentation

## 2019-03-08 DIAGNOSIS — H9193 Unspecified hearing loss, bilateral: Secondary | ICD-10-CM | POA: Insufficient documentation

## 2019-03-08 DIAGNOSIS — I5042 Chronic combined systolic (congestive) and diastolic (congestive) heart failure: Secondary | ICD-10-CM | POA: Insufficient documentation

## 2019-03-08 DIAGNOSIS — Z79899 Other long term (current) drug therapy: Secondary | ICD-10-CM | POA: Insufficient documentation

## 2019-03-08 DIAGNOSIS — E7800 Pure hypercholesterolemia, unspecified: Secondary | ICD-10-CM | POA: Insufficient documentation

## 2019-03-08 DIAGNOSIS — J449 Chronic obstructive pulmonary disease, unspecified: Secondary | ICD-10-CM | POA: Diagnosis not present

## 2019-03-08 DIAGNOSIS — E785 Hyperlipidemia, unspecified: Secondary | ICD-10-CM | POA: Diagnosis not present

## 2019-03-08 DIAGNOSIS — I110 Hypertensive heart disease with heart failure: Secondary | ICD-10-CM | POA: Diagnosis not present

## 2019-03-08 DIAGNOSIS — Z7951 Long term (current) use of inhaled steroids: Secondary | ICD-10-CM | POA: Diagnosis not present

## 2019-03-08 DIAGNOSIS — K219 Gastro-esophageal reflux disease without esophagitis: Secondary | ICD-10-CM | POA: Diagnosis not present

## 2019-03-08 DIAGNOSIS — Z9981 Dependence on supplemental oxygen: Secondary | ICD-10-CM | POA: Diagnosis not present

## 2019-03-08 DIAGNOSIS — I252 Old myocardial infarction: Secondary | ICD-10-CM | POA: Insufficient documentation

## 2019-03-08 DIAGNOSIS — Z87891 Personal history of nicotine dependence: Secondary | ICD-10-CM | POA: Insufficient documentation

## 2019-03-08 DIAGNOSIS — Z8673 Personal history of transient ischemic attack (TIA), and cerebral infarction without residual deficits: Secondary | ICD-10-CM | POA: Insufficient documentation

## 2019-03-08 DIAGNOSIS — Z419 Encounter for procedure for purposes other than remedying health state, unspecified: Secondary | ICD-10-CM

## 2019-03-08 HISTORY — PX: HARDWARE REMOVAL: SHX979

## 2019-03-08 LAB — BASIC METABOLIC PANEL
Anion gap: 12 (ref 5–15)
BUN: 25 mg/dL — ABNORMAL HIGH (ref 8–23)
CO2: 26 mmol/L (ref 22–32)
Calcium: 8.6 mg/dL — ABNORMAL LOW (ref 8.9–10.3)
Chloride: 100 mmol/L (ref 98–111)
Creatinine, Ser: 1.47 mg/dL — ABNORMAL HIGH (ref 0.44–1.00)
GFR calc Af Amer: 44 mL/min — ABNORMAL LOW (ref >60)
GFR calc non Af Amer: 38 mL/min — ABNORMAL LOW (ref >60)
Glucose, Bld: 335 mg/dL — ABNORMAL HIGH (ref 70–99)
Potassium: 4.8 mmol/L (ref 3.5–5.1)
Sodium: 138 mmol/L (ref 135–145)

## 2019-03-08 LAB — CBC
HCT: 48.5 % — ABNORMAL HIGH (ref 36.0–46.0)
Hemoglobin: 14.1 g/dL (ref 12.0–15.0)
MCH: 29 pg (ref 26.0–34.0)
MCHC: 29.1 g/dL — ABNORMAL LOW (ref 30.0–36.0)
MCV: 99.8 fL (ref 80.0–100.0)
Platelets: 212 10*3/uL (ref 150–400)
RBC: 4.86 MIL/uL (ref 3.87–5.11)
RDW: 14.3 % (ref 11.5–15.5)
WBC: 11.2 10*3/uL — ABNORMAL HIGH (ref 4.0–10.5)
nRBC: 0 % (ref 0.0–0.2)

## 2019-03-08 LAB — GLUCOSE, CAPILLARY
Glucose-Capillary: 142 mg/dL — ABNORMAL HIGH (ref 70–99)
Glucose-Capillary: 221 mg/dL — ABNORMAL HIGH (ref 70–99)

## 2019-03-08 SURGERY — REMOVAL, HARDWARE
Anesthesia: General | Site: Leg Upper | Laterality: Left

## 2019-03-08 MED ORDER — EPHEDRINE SULFATE 50 MG/ML IJ SOLN
INTRAMUSCULAR | Status: DC | PRN
  Administered 2019-03-08: 10 mg via INTRAVENOUS
  Administered 2019-03-08: 5 mg via INTRAVENOUS

## 2019-03-08 MED ORDER — PHENYLEPHRINE HCL (PRESSORS) 10 MG/ML IV SOLN
INTRAVENOUS | Status: DC | PRN
  Administered 2019-03-08: 150 ug via INTRAVENOUS
  Administered 2019-03-08: 200 ug via INTRAVENOUS

## 2019-03-08 MED ORDER — IPRATROPIUM-ALBUTEROL 0.5-2.5 (3) MG/3ML IN SOLN
RESPIRATORY_TRACT | Status: AC
  Filled 2019-03-08: qty 3

## 2019-03-08 MED ORDER — CEFAZOLIN SODIUM-DEXTROSE 2-4 GM/100ML-% IV SOLN
2.0000 g | INTRAVENOUS | Status: AC
  Administered 2019-03-08: 09:00:00 2 g via INTRAVENOUS

## 2019-03-08 MED ORDER — FENTANYL CITRATE (PF) 100 MCG/2ML IJ SOLN
INTRAMUSCULAR | Status: AC
  Filled 2019-03-08: qty 2

## 2019-03-08 MED ORDER — SUCCINYLCHOLINE CHLORIDE 20 MG/ML IJ SOLN
INTRAMUSCULAR | Status: AC
  Filled 2019-03-08: qty 1

## 2019-03-08 MED ORDER — IPRATROPIUM-ALBUTEROL 0.5-2.5 (3) MG/3ML IN SOLN
3.0000 mL | RESPIRATORY_TRACT | Status: DC
  Administered 2019-03-08: 11:00:00 3 mL via RESPIRATORY_TRACT

## 2019-03-08 MED ORDER — SUCCINYLCHOLINE CHLORIDE 20 MG/ML IJ SOLN
INTRAMUSCULAR | Status: DC | PRN
  Administered 2019-03-08: 140 mg via INTRAVENOUS

## 2019-03-08 MED ORDER — FAMOTIDINE 20 MG PO TABS
ORAL_TABLET | ORAL | Status: AC
  Administered 2019-03-08: 20 mg via ORAL
  Filled 2019-03-08: qty 1

## 2019-03-08 MED ORDER — LIDOCAINE HCL (CARDIAC) PF 100 MG/5ML IV SOSY
PREFILLED_SYRINGE | INTRAVENOUS | Status: DC | PRN
  Administered 2019-03-08: 50 mg via INTRAVENOUS

## 2019-03-08 MED ORDER — HYDROCODONE-ACETAMINOPHEN 5-325 MG PO TABS: 1.0000 | ORAL_TABLET | Freq: Four times a day (QID) | ORAL | Status: DC | PRN

## 2019-03-08 MED ORDER — FENTANYL CITRATE (PF) 100 MCG/2ML IJ SOLN: 25.0000 ug | INTRAMUSCULAR | Status: DC | PRN

## 2019-03-08 MED ORDER — PROPOFOL 10 MG/ML IV BOLUS
INTRAVENOUS | Status: AC
  Filled 2019-03-08: qty 20

## 2019-03-08 MED ORDER — ONDANSETRON HCL 4 MG PO TABS: 4.0000 mg | ORAL_TABLET | Freq: Four times a day (QID) | ORAL | Status: DC | PRN

## 2019-03-08 MED ORDER — DEXAMETHASONE SODIUM PHOSPHATE 10 MG/ML IJ SOLN
INTRAMUSCULAR | Status: AC
  Filled 2019-03-08: qty 1

## 2019-03-08 MED ORDER — METOCLOPRAMIDE HCL 5 MG/ML IJ SOLN: 5.0000 mg | Freq: Three times a day (TID) | INTRAMUSCULAR | Status: DC | PRN

## 2019-03-08 MED ORDER — ALBUTEROL SULFATE HFA 108 (90 BASE) MCG/ACT IN AERS
INHALATION_SPRAY | RESPIRATORY_TRACT | Status: AC
  Filled 2019-03-08: qty 6.7

## 2019-03-08 MED ORDER — ROCURONIUM BROMIDE 100 MG/10ML IV SOLN
INTRAVENOUS | Status: DC | PRN
  Administered 2019-03-08: 10 mg via INTRAVENOUS

## 2019-03-08 MED ORDER — ONDANSETRON HCL 4 MG/2ML IJ SOLN
INTRAMUSCULAR | Status: AC
  Filled 2019-03-08: qty 2

## 2019-03-08 MED ORDER — MIDAZOLAM HCL 2 MG/2ML IJ SOLN
INTRAMUSCULAR | Status: DC | PRN
  Administered 2019-03-08: .5 mg via INTRAVENOUS

## 2019-03-08 MED ORDER — ONDANSETRON HCL 4 MG/2ML IJ SOLN
INTRAMUSCULAR | Status: DC | PRN
  Administered 2019-03-08: 4 mg via INTRAVENOUS

## 2019-03-08 MED ORDER — FAMOTIDINE 20 MG PO TABS: 20.0000 mg | ORAL_TABLET | Freq: Once | ORAL | Status: AC

## 2019-03-08 MED ORDER — MIDAZOLAM HCL 2 MG/2ML IJ SOLN
INTRAMUSCULAR | Status: AC
  Filled 2019-03-08: qty 2

## 2019-03-08 MED ORDER — NEOMYCIN-POLYMYXIN B GU 40-200000 IR SOLN
Status: DC | PRN
  Administered 2019-03-08: 2 mL

## 2019-03-08 MED ORDER — METOCLOPRAMIDE HCL 10 MG PO TABS: 5.0000 mg | ORAL_TABLET | Freq: Three times a day (TID) | ORAL | Status: DC | PRN

## 2019-03-08 MED ORDER — PROPOFOL 10 MG/ML IV BOLUS
INTRAVENOUS | Status: DC | PRN
  Administered 2019-03-08: 140 mg via INTRAVENOUS
  Administered 2019-03-08: 40 mg via INTRAVENOUS

## 2019-03-08 MED ORDER — ALBUTEROL SULFATE HFA 108 (90 BASE) MCG/ACT IN AERS
INHALATION_SPRAY | RESPIRATORY_TRACT | Status: DC | PRN
  Administered 2019-03-08: 8 via RESPIRATORY_TRACT
  Administered 2019-03-08: 4 via RESPIRATORY_TRACT

## 2019-03-08 MED ORDER — HYDROCODONE-ACETAMINOPHEN 5-325 MG PO TABS
ORAL_TABLET | ORAL | Status: AC
  Administered 2019-03-08: 1 via ORAL
  Filled 2019-03-08: qty 1

## 2019-03-08 MED ORDER — DEXAMETHASONE SODIUM PHOSPHATE 10 MG/ML IJ SOLN
INTRAMUSCULAR | Status: DC | PRN
  Administered 2019-03-08: 5 mg via INTRAVENOUS

## 2019-03-08 MED ORDER — SODIUM CHLORIDE 0.9 % IV SOLN: INTRAVENOUS | Status: DC

## 2019-03-08 MED ORDER — EPHEDRINE SULFATE 50 MG/ML IJ SOLN
INTRAMUSCULAR | Status: AC
  Filled 2019-03-08: qty 1

## 2019-03-08 MED ORDER — FENTANYL CITRATE (PF) 100 MCG/2ML IJ SOLN
INTRAMUSCULAR | Status: DC | PRN
  Administered 2019-03-08: 50 ug via INTRAVENOUS
  Administered 2019-03-08: 25 ug via INTRAVENOUS

## 2019-03-08 MED ORDER — HYDROCODONE-ACETAMINOPHEN 5-325 MG PO TABS: 1.0000 | ORAL_TABLET | Freq: Four times a day (QID) | ORAL | 0 refills | Status: DC | PRN

## 2019-03-08 MED ORDER — LIDOCAINE HCL (PF) 2 % IJ SOLN
INTRAMUSCULAR | Status: AC
  Filled 2019-03-08: qty 10

## 2019-03-08 MED ORDER — ONDANSETRON HCL 4 MG/2ML IJ SOLN: 4.0000 mg | Freq: Four times a day (QID) | INTRAMUSCULAR | Status: DC | PRN

## 2019-03-08 MED ORDER — ONDANSETRON HCL 4 MG/2ML IJ SOLN: 4.0000 mg | Freq: Once | INTRAMUSCULAR | Status: DC | PRN

## 2019-03-08 MED ORDER — CHLORHEXIDINE GLUCONATE 4 % EX LIQD
60.0000 mL | Freq: Once | CUTANEOUS | Status: AC
  Administered 2019-03-08: 4 via TOPICAL

## 2019-03-08 MED ORDER — CEFAZOLIN SODIUM-DEXTROSE 2-4 GM/100ML-% IV SOLN
INTRAVENOUS | Status: AC
  Filled 2019-03-08: qty 100

## 2019-03-08 SURGICAL SUPPLY — 42 items
BNDG COHESIVE 6X5 TAN STRL LF (GAUZE/BANDAGES/DRESSINGS) ×1 IMPLANT
CANISTER SUCT 1200ML W/VALVE (MISCELLANEOUS) ×2 IMPLANT
CHLORAPREP W/TINT 26 (MISCELLANEOUS) ×2 IMPLANT
COVER WAND RF STERILE (DRAPES) ×2 IMPLANT
CUFF TOURN SGL QUICK 24 (TOURNIQUET CUFF)
CUFF TOURN SGL QUICK 30 (TOURNIQUET CUFF)
CUFF TRNQT CYL 24X4X16.5-23 (TOURNIQUET CUFF) IMPLANT
CUFF TRNQT CYL 30X4X21-28X (TOURNIQUET CUFF) IMPLANT
DRAPE C-ARM XRAY 36X54 (DRAPES) ×2 IMPLANT
DRAPE INCISE IOBAN 66X45 STRL (DRAPES) ×2 IMPLANT
DRAPE U-SHAPE 47X51 STRL (DRAPES) ×1 IMPLANT
DRSG EMULSION OIL 3X8 NADH (GAUZE/BANDAGES/DRESSINGS) ×2 IMPLANT
ELECT CAUTERY BLADE 6.4 (BLADE) ×2 IMPLANT
ELECT REM PT RETURN 9FT ADLT (ELECTROSURGICAL) ×2
ELECTRODE REM PT RTRN 9FT ADLT (ELECTROSURGICAL) ×1 IMPLANT
GAUZE SPONGE 4X4 12PLY STRL (GAUZE/BANDAGES/DRESSINGS) ×2 IMPLANT
GAUZE XEROFORM 1X8 LF (GAUZE/BANDAGES/DRESSINGS) ×2 IMPLANT
GLOVE SURG SYN 9.0  PF PI (GLOVE) ×1
GLOVE SURG SYN 9.0 PF PI (GLOVE) ×1 IMPLANT
GOWN SRG 2XL LVL 4 RGLN SLV (GOWNS) ×1 IMPLANT
GOWN STRL NON-REIN 2XL LVL4 (GOWNS) ×1
GOWN STRL REUS W/ TWL LRG LVL3 (GOWN DISPOSABLE) ×1 IMPLANT
GOWN STRL REUS W/TWL LRG LVL3 (GOWN DISPOSABLE) ×1
HANDLE YANKAUER SUCT BULB TIP (MISCELLANEOUS) ×1 IMPLANT
KIT TURNOVER KIT A (KITS) ×2 IMPLANT
NDL FILTER BLUNT 18X1 1/2 (NEEDLE) ×1 IMPLANT
NEEDLE FILTER BLUNT 18X 1/2SAF (NEEDLE) ×1
NEEDLE FILTER BLUNT 18X1 1/2 (NEEDLE) ×1 IMPLANT
NS IRRIG 1000ML POUR BTL (IV SOLUTION) ×2 IMPLANT
PACK EXTREMITY ARMC (MISCELLANEOUS) ×2 IMPLANT
PAD ABD DERMACEA PRESS 5X9 (GAUZE/BANDAGES/DRESSINGS) ×4 IMPLANT
SCALPEL PROTECTED #15 DISP (BLADE) ×4 IMPLANT
STAPLER SKIN PROX 35W (STAPLE) ×3 IMPLANT
STOCKINETTE M/LG 89821 (MISCELLANEOUS) ×1 IMPLANT
SUT ETHIBOND NAB CT1 #1 30IN (SUTURE) ×2 IMPLANT
SUT ETHILON 3-0 FS-10 30 BLK (SUTURE) ×2
SUT VIC AB 0 CT1 36 (SUTURE) ×2 IMPLANT
SUT VIC AB 2-0 CT1 27 (SUTURE) ×1
SUT VIC AB 2-0 CT1 TAPERPNT 27 (SUTURE) ×1 IMPLANT
SUTURE EHLN 3-0 FS-10 30 BLK (SUTURE) ×1 IMPLANT
SYR 10ML LL (SYRINGE) ×2 IMPLANT
WATER STERILE IRR 1000ML POUR (IV SOLUTION) ×2 IMPLANT

## 2019-03-08 NOTE — Addendum Note (Signed)
Addendum  created 03/08/19 1218 by Lynden Oxford, CRNA   Child order released for a procedure order, Clinical Note Signed, Intraprocedure Blocks edited

## 2019-03-08 NOTE — ED Triage Notes (Signed)
Pt to triage via wheelchair.  Pt had hardware removed from left thigh this am by dr Rosita Kea.  Pt has bleeding thru the bandage of left thigh.  Pt alert  Speech clear.

## 2019-03-08 NOTE — Op Note (Signed)
03/08/2019  9:42 AM  PATIENT:  Brenda Wu  64 y.o. female  PRE-OPERATIVE DIAGNOSIS:  PAINFUL ORTHOPEADIC HARDWARE  POST-OPERATIVE DIAGNOSIS:  PAINFUL ORTHOPEADIC HARDWARE  PROCEDURE:  Procedure(s): HARDWARE REMOVAL OF DISTAL FEMUR (Left)  SURGEON: Leitha Schuller, MD  ASSISTANTS: None  ANESTHESIA:   general  EBL:  Total I/O In: 500 [I.V.:500] Out: 50 [Blood:50]  BLOOD ADMINISTERED:none  DRAINS: none   LOCAL MEDICATIONS USED:  NONE  SPECIMEN:  No Specimen  DISPOSITION OF SPECIMEN:  N/A  COUNTS:  YES  TOURNIQUET:  * No tourniquets in log *  IMPLANTS: None  DICTATION: .Dragon Dictation patient was brought to the operating room and after adequate anesthesia was obtained the left leg was prepped and draped in the usual sterile manner.  After patient identification and timeout procedures were completed the C arm was brought in and good visualization of all hardware was noted.  The distal prior incision was utilized and the skin and subcutaneous tissue were divided getting down to the IT band splitting it and then getting through the lateral capsule exposing the distal portion of the plate.  All the distal screws were removed without difficulty.  There was some bony overgrowth along the plate at the level of a screw just above the most distal screws.  This was removed with use of a rondure and the screw removed as well.  After this had been completed attention was turned to the 4 proximal cortical screws a single incision approximately an inch long was made incorporating to the prior incision was skin and subcutaneous tissue spread and the screwdriver head was attached to the each of the screws sequentially backing it up by hand part until it was clearly off the plate and then using power to remove it all screws then removed.  Scar tissue was then removed around the edges of the plate distally and the plate was elevated off the distal femur such that it could be pulled out and  submuscular fashion as it was put in.  At this point all wounds were thoroughly irrigated.  The deep tissue was rerepaired using a running #1 Vicryl suture.  2-0 Vicryl was used subcutaneously followed by skin staples and Xeroform 4 x 4's ABD and foam tape.  Permanent C arm views obtained showing healed fracture  PLAN OF CARE: Discharge to home after PACU  PATIENT DISPOSITION:  PACU - hemodynamically stable.

## 2019-03-08 NOTE — Discharge Instructions (Addendum)
AMBULATORY SURGERY  DISCHARGE INSTRUCTIONS   1) The drugs that you were given will stay in your system until tomorrow so for the next 24 hours you should not:  A) Drive an automobile B) Make any legal decisions C) Drink any alcoholic beverage   2) You may resume regular meals tomorrow.  Today it is better to start with liquids and gradually work up to solid foods.  You may eat anything you prefer, but it is better to start with liquids, then soup and crackers, and gradually work up to solid foods.   3) Please notify your doctor immediately if you have any unusual bleeding, trouble breathing, redness and pain at the surgery site, drainage, fever, or pain not relieved by medication.    4) Additional Instructions:        Please contact your physician with any problems or Same Day Surgery at [phone], Monday through Friday 6 am to 4 pm, or [place] at [facility] number at [phone].Take it easy through the weekend walking as little as possible.  I recommend using a walker for the next 2 weeks to help with skin healing, and avoiding refracture.  Pain medicine as directed.  Leave dressing in place until Monday.  Keep dressing clean and dry.

## 2019-03-08 NOTE — Transfer of Care (Signed)
Immediate Anesthesia Transfer of Care Note  Patient: Brenda Wu  Procedure(s) Performed: HARDWARE REMOVAL OF DISTAL FEMUR (Left Leg Upper)  Patient Location: PACU  Anesthesia Type:General  Level of Consciousness: drowsy  Airway & Oxygen Therapy: Patient Spontanous Breathing and Patient connected to face mask oxygen  Post-op Assessment: Report given to RN and Post -op Vital signs reviewed and stable  Post vital signs: Reviewed and stable  Last Vitals:  Vitals Value Taken Time  BP 141/74 03/08/19 1009  Temp    Pulse 76 03/08/19 1012  Resp 15 03/08/19 1012  SpO2 96 % 03/08/19 1012  Vitals shown include unvalidated device data.  Last Pain:  Vitals:   03/08/19 0733  TempSrc: Tympanic  PainSc: 0-No pain         Complications: No apparent anesthesia complications

## 2019-03-08 NOTE — OR Nursing (Signed)
Dr Rosita Kea called to come evaluate dressing and bleeding prior to discharge.

## 2019-03-08 NOTE — Anesthesia Preprocedure Evaluation (Addendum)
Anesthesia Evaluation  Patient identified by MRN, date of birth, ID band Patient awake    Reviewed: Allergy & Precautions, NPO status , Patient's Chart, lab work & pertinent test results  History of Anesthesia Complications Negative for: history of anesthetic complications  Airway Mallampati: III       Dental   Pulmonary asthma , sleep apnea, Continuous Positive Airway Pressure Ventilation and Oxygen sleep apnea , COPD,  COPD inhaler and oxygen dependent, Not current smoker, former smoker,           Cardiovascular hypertension, Pt. on medications + Past MI and +CHF  (-) dysrhythmias (-) Valvular Problems/Murmurs     Neuro/Psych Seizures -,  Depression CVA, No Residual Symptoms    GI/Hepatic Neg liver ROS, GERD  ,  Endo/Other  diabetes, Type 2, Oral Hypoglycemic Agents  Renal/GU negative Renal ROS     Musculoskeletal   Abdominal   Peds  Hematology   Anesthesia Other Findings   Reproductive/Obstetrics                            Anesthesia Physical Anesthesia Plan  ASA: III  Anesthesia Plan: General   Post-op Pain Management:    Induction: Intravenous  PONV Risk Score and Plan: 3 and Dexamethasone, Ondansetron and Treatment may vary due to age or medical condition  Airway Management Planned: Oral ETT  Additional Equipment:   Intra-op Plan:   Post-operative Plan:   Informed Consent: I have reviewed the patients History and Physical, chart, labs and discussed the procedure including the risks, benefits and alternatives for the proposed anesthesia with the patient or authorized representative who has indicated his/her understanding and acceptance.       Plan Discussed with:   Anesthesia Plan Comments:         Anesthesia Quick Evaluation

## 2019-03-08 NOTE — ED Notes (Addendum)
Pt taken into triage for reassessment; reports bleeding from surg site; using sterile technique, foam dressing removed from left thigh; no bleeding noted, staples intact; sterile 4x4 applied with paper tape; pt with no c/o at present

## 2019-03-08 NOTE — H&P (Signed)
Reviewed paper H+P, will be scanned into chart. No changes noted.  

## 2019-03-08 NOTE — OR Nursing (Signed)
Dr Rosita Kea in to evaluate patient.  Assessed dressing no active bleeding noted, may discharge home.

## 2019-03-08 NOTE — Anesthesia Procedure Notes (Signed)
Procedure Name: Intubation Date/Time: 03/08/2019 8:27 AM Performed by: Lynden Oxford, CRNA Pre-anesthesia Checklist: Patient identified, Emergency Drugs available, Suction available and Patient being monitored Patient Re-evaluated:Patient Re-evaluated prior to induction Oxygen Delivery Method: Circle system utilized Preoxygenation: Pre-oxygenation with 100% oxygen Induction Type: IV induction Ventilation: Mask ventilation without difficulty Laryngoscope Size: McGraph and 3 Grade View: Grade I Tube type: Oral Tube size: 7.0 mm Number of attempts: 1 Airway Equipment and Method: Stylet and Oral airway Placement Confirmation: ETT inserted through vocal cords under direct vision,  positive ETCO2 and breath sounds checked- equal and bilateral Secured at: 20 cm Tube secured with: Tape Dental Injury: Teeth and Oropharynx as per pre-operative assessment

## 2019-03-08 NOTE — Anesthesia Postprocedure Evaluation (Signed)
Anesthesia Post Note  Patient: Brenda Wu  Procedure(s) Performed: HARDWARE REMOVAL OF DISTAL FEMUR (Left Leg Upper)  Patient location during evaluation: PACU Anesthesia Type: General Level of consciousness: awake and alert Pain management: pain level controlled Vital Signs Assessment: post-procedure vital signs reviewed and stable Respiratory status: spontaneous breathing and respiratory function stable Cardiovascular status: blood pressure returned to baseline and stable Anesthetic complications: no     Last Vitals:  Vitals:   03/08/19 1104 03/08/19 1110  BP:  (!) 98/58  Pulse: 75 73  Resp: 15 18  Temp:    SpO2: 95% 92%    Last Pain:  Vitals:   03/08/19 0733  TempSrc: Tympanic  PainSc: 0-No pain                 KEPHART,WILLIAM K

## 2019-03-09 ENCOUNTER — Emergency Department
Admission: EM | Admit: 2019-03-09 | Discharge: 2019-03-09 | Disposition: A | Discharge status: Home / Self Care | Payer: Medicare HMO | Source: Home / Self Care | Attending: Emergency Medicine | Admitting: Emergency Medicine

## 2019-03-09 DIAGNOSIS — L7634 Postprocedural seroma of skin and subcutaneous tissue following other procedure: Secondary | ICD-10-CM

## 2019-03-09 NOTE — ED Notes (Addendum)
Dr. Manson Passey in to eval, no bleeding noted and dressing applied.

## 2019-03-09 NOTE — ED Provider Notes (Signed)
Brenda Wu     (approximate)  I have reviewed the triage vital signs and the nursing notes.   HISTORY  Chief Complaint Post-op Problem    HPI Brenda Wu is a 64 y.o. female with below list of previous medical conditions including recent hardware removal from the left leg yesterday by Dr. Trilby Drummer returns to the emergency department secondary to bleeding from the incision site.  Patient presented with overlying dressing saturated with blood however no active bleeding from the actual wound at present.  Patient states pain is controlled with medication was prescribed.     Past Medical History:  Diagnosis Date  . Chronic combined systolic (congestive) and diastolic (congestive) heart failure (HCC)    a. 10/2017 Echo: EF 45-50%, diff HK. Mild to mod MR, mildly dil LA. Mod dil RV w/ mildly reduced RV fxn.  [name] COPD (chronic obstructive pulmonary disease) (HCC)   . Depression   . Diabetes mellitus without complication (HCC)   . Hypercholesteremia   . Hypertension   . Morbid obesity (HCC)   . NICM (nonischemic cardiomyopathy) (HCC)   . NSTEMI (non-ST elevated myocardial infarction) (HCC)    a. 12/2017 -->Cath: nl cors. EF 45-50%.  . Seizures (HCC)   . Sleep apnea   . Stroke (HCC)   . Tricuspid regurgitation    a. 10/2017 Echo: Mild to mod TR.    Patient Active Problem List   Diagnosis Date Noted  . Fracture of prosthetic knee (HCC) 05/12/2018  . NICM (nonischemic cardiomyopathy) (HCC) 02/13/2018  . Non-ST elevation (NSTEMI) myocardial infarction (HCC)   . Chronic combined systolic and diastolic heart failure (HCC) 12/12/2017  . HTN (hypertension) 12/12/2017  . Diabetes (HCC) 12/12/2017  . COPD exacerbation (HCC) 02/07/2017  . History of cerebellar stroke 03/12/2016  . History of seizures 03/12/2016  . Pulmonary  nodule, left 01/05/2016  . Paroxysmal atrial fibrillation (HCC) 10/17/2015  . Cerebellar stroke (HCC) 06/26/2015  . Transient diplopia 06/09/2015  . Asthma-chronic obstructive pulmonary disease overlap syndrome (HCC) 05/30/2015  . COPD with asthma (HCC) 05/30/2015  . Hypoxemia 05/30/2015  . Obstructive sleep apnea syndrome 05/30/2015  . OSA on CPAP 05/30/2015  . Obesity (BMI 30-39.9) 03/12/2015  . Bilateral hearing loss 01/22/2015  . Generalized seizure disorder (HCC) 01/22/2015  . Hyperlipidemia associated with type 2 diabetes mellitus (HCC) 01/22/2015  . Hypertension associated with diabetes (HCC) 01/22/2015  . Morbid obesity due to excess calories (HCC) 01/22/2015  . Primary osteoarthritis involving multiple joints 01/22/2015  . Severe episode of recurrent major depressive disorder, without psychotic features (HCC) 01/22/2015    Past Surgical History:  Procedure Laterality Date  . ABDOMINAL HYSTERECTOMY    . LEFT HEART CATH AND CORONARY ANGIOGRAPHY N/A 01/09/2018   Procedure: LEFT HEART CATH AND CORONARY ANGIOGRAPHY;  Surgeon: Iran Ouch, MD;  Location: [facility] INVASIVE CV LAB;  Service: Cardiovascular;  Laterality: N/A;  . ORIF FEMUR FRACTURE Left 05/12/2018   Procedure: OPEN REDUCTION INTERNAL FIXATION (ORIF) DISTAL FEMUR FRACTURE;  Surgeon: Kennedy Bucker, MD;  Location: [facility] ORS;  Service: Orthopedics;  Laterality: Left;  . REPLACEMENT TOTAL KNEE      Prior to Admission medications   Medication Sig Start Date End Date Taking? Authorizing Provider  albuterol (PROVENTIL HFA;VENTOLIN HFA) 108 (90 Base) MCG/ACT inhaler Inhale 2 puffs into the lungs 4 (four) times daily as needed for wheezing or shortness of breath.  01/05/16  [provider]  albuterol (PROVENTIL) (2.5 MG/3ML) 0.083% nebulizer solution Inhale 2.5 mg into the lungs every 6 (six) hours as needed for wheezing or shortness of breath.  10/01/16   [provider]  aspirin EC 81 MG tablet Take 1  tablet (81 mg total) by mouth daily. 02/13/18   Minna Merritts, MD  atorvastatin (LIPITOR) 40 MG tablet Take 40 mg by mouth every evening.     [provider]  budesonide-formoterol (SYMBICORT) 160-4.5 MCG/ACT inhaler Inhale 2 puffs into the lungs 2 (two) times daily. 05/22/15   [provider]  diclofenac (VOLTAREN) 75 MG EC tablet Take 1 tablet (75 mg total) by mouth 2 (two) times daily as needed. 01/25/19   Gillis Santa, MD  diltiazem (CARDIZEM CD) 180 MG 24 hr capsule Take 1 capsule (180 mg total) by mouth daily. 08/22/18   Alisa Graff, FNP  docusate sodium (COLACE) 100 MG capsule Take 1 capsule (100 mg total) by mouth 2 (two) times daily. Patient not taking: Reported on 03/08/2019 05/16/18   Gladstone Lighter, MD  glipiZIDE (GLUCOTROL) 10 MG tablet Take 10 mg by mouth 2 (two) times daily before a meal.     [provider]  HYDROcodone-acetaminophen (NORCO) 5-325 MG tablet Take 1-2 tablets by mouth every 6 (six) hours as needed for moderate pain. 03/08/19   Hessie Knows, MD  insulin glargine (LANTUS) 100 UNIT/ML injection Inject 15 Units into the skin daily.    [provider]  lamoTRIgine (LAMICTAL) 200 MG tablet Take 200 mg by mouth 2 (two) times daily.     [provider]  losartan (COZAAR) 50 MG tablet Take 50 mg by mouth daily.    [provider]  Multiple Vitamin (MULTI-VITAMINS) TABS Take 1 tablet by mouth daily.    [provider]  niacin 500 MG tablet Take 500 mg by mouth daily.    [provider]  potassium chloride SA (K-DUR,KLOR-CON) 20 MEQ tablet Take 1 tablet (20 mEq total) by mouth daily. 02/12/17   Gladstone Lighter, MD  pregabalin (LYRICA) 75 MG capsule Take 75 mg by mouth 2 (two) times daily.    [provider]  sitaGLIPtin (JANUVIA) 50 MG tablet Take 50 mg by mouth daily.    [provider]  tiotropium (SPIRIVA) 18 MCG inhalation capsule Place 18 mcg into inhaler and inhale daily.     [provider]  tiZANidine (ZANAFLEX) 4 MG tablet Take 1 tablet (4 mg total) by mouth 2 (two) times daily as needed for muscle spasms. 01/25/19 01/25/20  Gillis Santa, MD  topiramate (TOPAMAX) 200 MG tablet Take 200 mg by mouth 2 (two) times daily.    [provider]  torsemide (DEMADEX) 20 MG tablet Take 2 tablets (40 mg total) by mouth daily. Note dose change 08/22/18   Darylene Price A, FNP  vitamin B-12 (CYANOCOBALAMIN) 500 MCG tablet Take 500 mcg by mouth daily.    [provider]    Allergies Bismuth subsalicylate  Family History  Problem Relation Age of Onset  . Hypertension Father   . Asthma Sister   . Heart murmur Sister   . Diabetes Sister     Social History Social History   Tobacco Use  . Smoking status: Former Smoker    Packs/day: 1.00    Years: 30.00    Pack years: 30.00    Types: Cigarettes    Quit date: 02/22/2002    Years since quitting: 17.0  . Smokeless tobacco: Never Used  Substance Use Topics  . Alcohol use: No  . Drug use: No    Review of Systems Constitutional: No fever/chills Eyes: No visual changes. ENT: No sore throat. Cardiovascular: Denies chest pain. Respiratory: Denies shortness of breath. Gastrointestinal: No abdominal pain.  No nausea, no vomiting.  No diarrhea.  No constipation. Genitourinary: Negative for dysuria. Musculoskeletal: Negative for neck pain.  Negative for back pain.  Positive for bleeding from left thigh/knee surgical incisions site Integumentary: Negative for rash. Neurological: Negative for headaches, focal weakness or numbness.  ____________________________________________   PHYSICAL EXAM:  VITAL SIGNS: ED Triage Vitals  Enc Vitals Group     BP 03/08/19 1950 (!) 110/58     Pulse Rate 03/08/19 1950 93     Resp 03/08/19 1950 20     Temp 03/08/19 1950 98 F (36.7 C)     Temp Source 03/08/19 1950 Oral     SpO2 03/08/19 1950 95 %     Weight 03/08/19 1952 100.2 kg (221 lb)     Height  03/08/19 1952 1.626 m (5\' 4" )     Head Circumference --      Peak Flow --      Pain Score --      Pain Loc --      Pain Edu? --      Excl. in GC? --     Constitutional: Alert and oriented.  Eyes: Conjunctivae are normal.  Mouth/Throat: Patient is wearing a mask. Neck: No stridor.  No meningeal signs.   Gastrointestinal: Soft and nontender. No distention.  Musculoskeletal: Patient's incision on the left knee/thigh both clean dry intact without any active bleeding.  No discomfort with gentle palpation. Neurologic:  Normal speech and language. No gross focal neurologic deficits are appreciated.  Skin: Patient's and surgical wounds on the left knee/thigh clean dry and intact.  No active bleeding Psychiatric: Mood and affect are normal. Speech and behavior are normal.  ____________________________________________   LABS (all labs ordered are listed, but only abnormal results are displayed)  Labs Reviewed  BASIC METABOLIC PANEL - Abnormal; Notable for the following components:      Result Value   Glucose, Bld 335 (*)    BUN 25 (*)    Creatinine, Ser 1.47 (*)    Calcium 8.6 (*)    GFR calc non Af Amer 38 (*)    GFR calc Af Amer 44 (*)    All other components within normal limits  CBC - Abnormal; Notable for the following components:   WBC 11.2 (*)    HCT 48.5 (*)    MCHC 29.1 (*)    All other components within normal limits    RADIOLOGY I, [facility], personally viewed and evaluated these images (plain radiographs) as part of my medical decision making, as well as reviewing the written report by the radiologist.   Official radiology report(s): DG C-Arm 1-60 Min  Result Date: 03/08/2019 CLINICAL DATA:  Removal of hardware from the distal femur. EXAM: LEFT FEMUR 1 VIEW; DG C-ARM 1-60 MIN COMPARISON:  Radiographs dated 05/12/2018 FINDINGS: A single C-arm image demonstrates at the side plate and screws have been removed from the left femur. Healed distal left femur fracture  noted. IMPRESSION: Removal of fracture fixation hardware from the left femur. The femoral component of the left total knee prosthesis is partially visualized. FLUOROSCOPY TIME:  2 minutes 7 seconds.  10.7 mGy. C-arm fluoroscopic image was obtained intraoperatively and submitted for post operative interpretation. Electronically Signed  By: Francene Boyers M.D.   On: 03/08/2019 09:47   DG FEMUR MIN 2 VIEWS LEFT  Result Date: 03/08/2019 CLINICAL DATA:  Removal of hardware from the distal femur. EXAM: LEFT FEMUR 1 VIEW; DG C-ARM 1-60 MIN COMPARISON:  Radiographs dated 05/12/2018 FINDINGS: A single C-arm image demonstrates at the side plate and screws have been removed from the left femur. Healed distal left femur fracture noted. IMPRESSION: Removal of fracture fixation hardware from the left femur. The femoral component of the left total knee prosthesis is partially visualized. FLUOROSCOPY TIME:  2 minutes 7 seconds.  10.7 mGy. C-arm fluoroscopic image was obtained intraoperatively and submitted for post operative interpretation. Electronically Signed   By: Francene Boyers M.D.   On: 03/08/2019 09:47    :  Procedures   ____________________________________________   INITIAL IMPRESSION / MDM / ASSESSMENT AND PLAN / ED COURSE  As part of my medical decision making, I reviewed the following data within the electronic MEDICAL RECORD NUMBER   64 year old female presented with above-stated history and physical exam with differential diagnosis including evacuated seroma/hematoma following hardware removal.  Patient with no active bleeding at this time.  Patient's dressing changed.  Patient advised to follow-up with Dr. Rosita Kea today.       ____________________________________________  FINAL CLINICAL IMPRESSION(S) / ED DIAGNOSES  Final diagnoses:  Postoperative seroma of subcutaneous tissue after non-dermatologic procedure     MEDICATIONS GIVEN DURING THIS VISIT:  Medications - No data to  display   ED Discharge Orders    None      *Please note:  Vaunda Aldaba was evaluated in Emergency Department on 03/09/2019 for the symptoms described in the history of present illness. She was evaluated in the context of the global COVID-19 pandemic, which necessitated consideration that the patient might be at risk for infection with the SARS-CoV-2 virus that causes COVID-19. Institutional protocols and algorithms that pertain to the evaluation of patients at risk for COVID-19 are in a state of rapid change based on information released by regulatory bodies including the CDC and federal and state organizations. These policies and algorithms were followed during the patient's care in the ED.  Some ED evaluations and interventions may be delayed as a result of limited staffing during the pandemic.*  Note:  This document was prepared using Dragon voice recognition software and may include unintentional dictation errors.   Darci Current, MD 03/09/19 [phone]

## 2019-03-09 NOTE — ED Notes (Signed)
Patient discharged to home per MD order. Patient in stable condition, and deemed medically cleared by ED provider for discharge. Discharge instructions reviewed with patient/family using "Teach Back"; verbalized understanding of medication education and administration, and information about follow-up care. Denies further concerns. ° °

## 2019-03-15 ENCOUNTER — Other Ambulatory Visit: Payer: Self-pay

## 2019-03-15 ENCOUNTER — Emergency Department
Admission: EM | Admit: 2019-03-15 | Discharge: 2019-03-15 | Disposition: A | Discharge status: Home / Self Care | Payer: Medicare HMO | Attending: Emergency Medicine | Admitting: Emergency Medicine

## 2019-03-15 ENCOUNTER — Emergency Department: Payer: Medicare HMO

## 2019-03-15 ENCOUNTER — Ambulatory Visit (INDEPENDENT_AMBULATORY_CARE_PROVIDER_SITE_OTHER)
Admission: EM | Admit: 2019-03-15 | Discharge: 2019-03-15 | Disposition: A | Discharge status: Other Acute Inpatient Hospital | Payer: Medicare HMO | Source: Home / Self Care

## 2019-03-15 DIAGNOSIS — Z794 Long term (current) use of insulin: Secondary | ICD-10-CM | POA: Insufficient documentation

## 2019-03-15 DIAGNOSIS — R0902 Hypoxemia: Secondary | ICD-10-CM | POA: Diagnosis not present

## 2019-03-15 DIAGNOSIS — E119 Type 2 diabetes mellitus without complications: Secondary | ICD-10-CM | POA: Insufficient documentation

## 2019-03-15 DIAGNOSIS — R0602 Shortness of breath: Secondary | ICD-10-CM

## 2019-03-15 DIAGNOSIS — Z87891 Personal history of nicotine dependence: Secondary | ICD-10-CM | POA: Insufficient documentation

## 2019-03-15 DIAGNOSIS — R0603 Acute respiratory distress: Secondary | ICD-10-CM | POA: Insufficient documentation

## 2019-03-15 DIAGNOSIS — I5042 Chronic combined systolic (congestive) and diastolic (congestive) heart failure: Secondary | ICD-10-CM | POA: Diagnosis not present

## 2019-03-15 DIAGNOSIS — I509 Heart failure, unspecified: Secondary | ICD-10-CM

## 2019-03-15 DIAGNOSIS — I110 Hypertensive heart disease with heart failure: Secondary | ICD-10-CM | POA: Insufficient documentation

## 2019-03-15 DIAGNOSIS — J449 Chronic obstructive pulmonary disease, unspecified: Secondary | ICD-10-CM | POA: Insufficient documentation

## 2019-03-15 DIAGNOSIS — Z96659 Presence of unspecified artificial knee joint: Secondary | ICD-10-CM | POA: Insufficient documentation

## 2019-03-15 DIAGNOSIS — Z79899 Other long term (current) drug therapy: Secondary | ICD-10-CM | POA: Diagnosis not present

## 2019-03-15 DIAGNOSIS — R062 Wheezing: Secondary | ICD-10-CM | POA: Diagnosis not present

## 2019-03-15 DIAGNOSIS — J441 Chronic obstructive pulmonary disease with (acute) exacerbation: Secondary | ICD-10-CM

## 2019-03-15 DIAGNOSIS — R079 Chest pain, unspecified: Secondary | ICD-10-CM | POA: Diagnosis not present

## 2019-03-15 LAB — BASIC METABOLIC PANEL
Anion gap: 9 (ref 5–15)
BUN: 29 mg/dL — ABNORMAL HIGH (ref 8–23)
CO2: 33 mmol/L — ABNORMAL HIGH (ref 22–32)
Calcium: 8.6 mg/dL — ABNORMAL LOW (ref 8.9–10.3)
Chloride: 103 mmol/L (ref 98–111)
Creatinine, Ser: 1.4 mg/dL — ABNORMAL HIGH (ref 0.44–1.00)
GFR calc Af Amer: 47 mL/min — ABNORMAL LOW (ref >60)
GFR calc non Af Amer: 40 mL/min — ABNORMAL LOW (ref >60)
Glucose, Bld: 157 mg/dL — ABNORMAL HIGH (ref 70–99)
Potassium: 3.7 mmol/L (ref 3.5–5.1)
Sodium: 145 mmol/L (ref 135–145)

## 2019-03-15 LAB — CBC WITH DIFFERENTIAL/PLATELET
Abs Immature Granulocytes: 0.02 10*3/uL (ref 0.00–0.07)
Basophils Absolute: 0.1 10*3/uL (ref 0.0–0.1)
Basophils Relative: 1 %
Eosinophils Absolute: 0.4 10*3/uL (ref 0.0–0.5)
Eosinophils Relative: 5 %
HCT: 45 % (ref 36.0–46.0)
Hemoglobin: 13.2 g/dL (ref 12.0–15.0)
Immature Granulocytes: 0 %
Lymphocytes Relative: 27 %
Lymphs Abs: 2.1 10*3/uL (ref 0.7–4.0)
MCH: 29.1 pg (ref 26.0–34.0)
MCHC: 29.3 g/dL — ABNORMAL LOW (ref 30.0–36.0)
MCV: 99.3 fL (ref 80.0–100.0)
Monocytes Absolute: 0.7 10*3/uL (ref 0.1–1.0)
Monocytes Relative: 9 %
Neutro Abs: 4.6 10*3/uL (ref 1.7–7.7)
Neutrophils Relative %: 58 %
Platelets: 188 10*3/uL (ref 150–400)
RBC: 4.53 MIL/uL (ref 3.87–5.11)
RDW: 15.1 % (ref 11.5–15.5)
WBC: 7.8 10*3/uL (ref 4.0–10.5)
nRBC: 0.4 % — ABNORMAL HIGH (ref 0.0–0.2)

## 2019-03-15 LAB — TROPONIN I (HIGH SENSITIVITY): Troponin I (High Sensitivity): <2 ng/L (ref <18)

## 2019-03-15 MED ORDER — PREDNISONE 10 MG (21) PO TBPK: ORAL_TABLET | ORAL | 0 refills | Status: DC

## 2019-03-15 MED ORDER — AZITHROMYCIN 250 MG PO TABS: ORAL_TABLET | ORAL | 0 refills | Status: AC

## 2019-03-15 NOTE — ED Triage Notes (Signed)
Pt to ED via [facility] from [facility] for chief complaint of [facility], COPD exacerbation that started one week ago.   Pt received solumedrol at urgent care. Receiving 2g mag from EMS.  20 Left AC placed by [facility].  Pt supposed to be on 2L [place] continuous.  EMS reports that pt was 78% on RA, placed on 2L [place] was Sp O2 increased to 98%. EMS reports rales on auscultation.  Pt reporting pain in her left femur from surgery on the 1/14, to "remove hardware on the left femur".  Pt in NAD at this time.

## 2019-03-15 NOTE — ED Triage Notes (Signed)
Patient here for SOB that started about 1 week ago.

## 2019-03-15 NOTE — ED Notes (Signed)
This RN went into treatment room to check on pt, pt starting stating "Ya'll can't answer any of my questions". This RN questioned pt which question she was referring to, pt did not ask any questions to this RN other than asking where the MD was, this RN informed pt MD would be in to see pt when he was available.  Pt then began calling on cellphone telling somebody "Come get me, I am getting out of here".  This RN informed Dr. Derrill Kay that pt was requesting to leave.  Dr. Derrill Kay to bedside at this time.

## 2019-03-15 NOTE — Discharge Instructions (Addendum)
EMS will take to the emergency department for further evaluation and treatment. Based on assessment, this could represent a blood clot, COPD exacerbation, or CHF exacerbation. Less likely infectious, but they will look for infection too. Based on what they find, you may require admission.  Quentin Mulling, MSN, APRN, FNP-C, CEN Advanced Practice Provider [facility] 03/15/2019

## 2019-03-15 NOTE — ED Notes (Signed)
Signature pad not working at this time. Pt verbalizes understanding of d/c instructions, no further questions or concerns at this time

## 2019-03-15 NOTE — ED Notes (Signed)
Pt resting comfortably at this time. Call bell within reach

## 2019-03-15 NOTE — ED Provider Notes (Signed)
[facility] Emergency Department Provider Note ____________________________________________   I have reviewed the triage vital signs and the nursing notes.   HISTORY  Chief Complaint Shortness of Breath and COPD   History limited by: Not Limited   HPI Brenda Wu is a 64 y.o. female who presents to the emergency department today because of concern for shortness of breath. Patient is presenting from urgent care. Patient states that her breathing has gotten worse over the past few days. She has history of COPD and states that she feels like her home breathing treatments are no longer working. When it gets to this point the patient states she benefits from steroids and antibiotics.    Records reviewed. Per medical record review patient has a history of COPD, CHF.   Past Medical History:  Diagnosis Date  . Chronic combined systolic (congestive) and diastolic (congestive) heart failure (HCC)    a. 10/2017 Echo: EF 45-50%, diff HK. Mild to mod MR, mildly dil LA. Mod dil RV w/ mildly reduced RV fxn.  [name] COPD (chronic obstructive pulmonary disease) (HCC)   . Depression   . Diabetes mellitus without complication (HCC)   . Hypercholesteremia   . Hypertension   . Morbid obesity (HCC)   . NICM (nonischemic cardiomyopathy) (HCC)   . NSTEMI (non-ST elevated myocardial infarction) (HCC)    a. 12/2017 -->Cath: nl cors. EF 45-50%.  . Seizures (HCC)   . Sleep apnea   . Stroke (HCC)   . Tricuspid regurgitation    a. 10/2017 Echo: Mild to mod TR.    Patient Active Problem List   Diagnosis Date Noted  . Fracture of prosthetic knee (HCC) 05/12/2018  . NICM (nonischemic cardiomyopathy) (HCC) 02/13/2018  . Non-ST elevation (NSTEMI) myocardial infarction (HCC)   . Chronic combined systolic and diastolic heart failure (HCC) 12/12/2017  . HTN (hypertension) 12/12/2017  . Diabetes (HCC) 12/12/2017  . COPD exacerbation (HCC) 02/07/2017  . History of cerebellar stroke  03/12/2016  . History of seizures 03/12/2016  . Pulmonary nodule, left 01/05/2016  . Paroxysmal atrial fibrillation (HCC) 10/17/2015  . Cerebellar stroke (HCC) 06/26/2015  . Transient diplopia 06/09/2015  . Asthma-chronic obstructive pulmonary disease overlap syndrome (HCC) 05/30/2015  . COPD with asthma (HCC) 05/30/2015  . Hypoxemia 05/30/2015  . Obstructive sleep apnea syndrome 05/30/2015  . OSA on CPAP 05/30/2015  . Obesity (BMI 30-39.9) 03/12/2015  . Bilateral hearing loss 01/22/2015  . Generalized seizure disorder (HCC) 01/22/2015  . Hyperlipidemia associated with type 2 diabetes mellitus (HCC) 01/22/2015  . Hypertension associated with diabetes (HCC) 01/22/2015  . Morbid obesity due to excess calories (HCC) 01/22/2015  . Primary osteoarthritis involving multiple joints 01/22/2015  . Severe episode of recurrent major depressive disorder, without psychotic features (HCC) 01/22/2015    Past Surgical History:  Procedure Laterality Date  . ABDOMINAL HYSTERECTOMY    . HARDWARE REMOVAL Left 03/08/2019   Procedure: HARDWARE REMOVAL OF DISTAL FEMUR;  Surgeon: Kennedy Bucker, MD;  Location: [facility] ORS;  Service: Orthopedics;  Laterality: Left;  . LEFT HEART CATH AND CORONARY ANGIOGRAPHY N/A 01/09/2018   Procedure: LEFT HEART CATH AND CORONARY ANGIOGRAPHY;  Surgeon: Iran Ouch, MD;  Location: [facility] INVASIVE CV LAB;  Service: Cardiovascular;  Laterality: N/A;  . ORIF FEMUR FRACTURE Left 05/12/2018   Procedure: OPEN REDUCTION INTERNAL FIXATION (ORIF) DISTAL FEMUR FRACTURE;  Surgeon: Kennedy Bucker, MD;  Location: [facility] ORS;  Service: Orthopedics;  Laterality: Left;  . REPLACEMENT TOTAL KNEE      Prior to Admission  medications   Medication Sig Start Date End Date Taking? Authorizing Provider  albuterol (PROVENTIL HFA;VENTOLIN HFA) 108 (90 Base) MCG/ACT inhaler Inhale 2 puffs into the lungs 4 (four) times daily as needed for wheezing or shortness of breath.  01/05/16   [provider]  albuterol (PROVENTIL) (2.5 MG/3ML) 0.083% nebulizer solution Inhale 2.5 mg into the lungs every 6 (six) hours as needed for wheezing or shortness of breath.  10/01/16   [provider]  aspirin EC 81 MG tablet Take 1 tablet (81 mg total) by mouth daily. 02/13/18   [name], MD  atorvastatin (LIPITOR) 40 MG tablet Take 40 mg by mouth every evening.     [provider]  budesonide-formoterol (SYMBICORT) 160-4.5 MCG/ACT inhaler Inhale 2 puffs into the lungs 2 (two) times daily. 05/22/15   [provider]  diclofenac (VOLTAREN) 75 MG EC tablet Take 1 tablet (75 mg total) by mouth 2 (two) times daily as needed. 01/25/19   Edward Jolly, MD  diltiazem (CARDIZEM CD) 180 MG 24 hr capsule Take 1 capsule (180 mg total) by mouth daily. 08/22/18   Delma Freeze, FNP  docusate sodium (COLACE) 100 MG capsule Take 1 capsule (100 mg total) by mouth 2 (two) times daily. Patient not taking: Reported on 03/08/2019 05/16/18   [name], MD  glipiZIDE (GLUCOTROL) 10 MG tablet Take 10 mg by mouth 2 (two) times daily before a meal.     [provider]  HYDROcodone-acetaminophen (NORCO) 5-325 MG tablet Take 1-2 tablets by mouth every 6 (six) hours as needed for moderate pain. 03/08/19   Kennedy Bucker, MD  insulin glargine (LANTUS) 100 UNIT/ML injection Inject 15 Units into the skin daily.    [provider]  lamoTRIgine (LAMICTAL) 200 MG tablet Take 200 mg by mouth 2 (two) times daily.     [provider]  losartan (COZAAR) 50 MG tablet Take 50 mg by mouth daily.    [provider]  Multiple Vitamin (MULTI-VITAMINS) TABS Take 1 tablet by mouth daily.    [provider]  niacin 500 MG tablet Take 500 mg by mouth daily.    [provider]  potassium chloride SA (K-DUR,KLOR-CON) 20 MEQ tablet Take 1 tablet (20 mEq total) by mouth daily. 02/12/17   [name], MD  pregabalin (LYRICA) 75 MG capsule Take 75 mg by mouth 2  (two) times daily.    [provider]  sitaGLIPtin (JANUVIA) 50 MG tablet Take 50 mg by mouth daily.    [provider]  tiotropium (SPIRIVA) 18 MCG inhalation capsule Place 18 mcg into inhaler and inhale daily.    [provider]  tiZANidine (ZANAFLEX) 4 MG tablet Take 1 tablet (4 mg total) by mouth 2 (two) times daily as needed for muscle spasms. 01/25/19 01/25/20  Edward Jolly, MD  topiramate (TOPAMAX) 200 MG tablet Take 200 mg by mouth 2 (two) times daily.    [provider]  torsemide (DEMADEX) 20 MG tablet Take 2 tablets (40 mg total) by mouth daily. Note dose change 08/22/18   Clarisa Kindred A, FNP  vitamin B-12 (CYANOCOBALAMIN) 500 MCG tablet Take 500 mcg by mouth daily.    [provider]    Allergies Bismuth subsalicylate  Family History  Problem Relation Age of Onset  . Hypertension Father   . Asthma Sister   . Heart murmur Sister   . Diabetes Sister     Social History Social History   Tobacco Use  .  Smoking status: Former Smoker    Packs/day: 1.00    Years: 30.00    Pack years: 30.00    Types: Cigarettes    Quit date: 02/22/2002    Years since quitting: 17.0  . Smokeless tobacco: Never Used  Substance Use Topics  . Alcohol use: No  . Drug use: No    Review of Systems Constitutional: No fever/chills Eyes: No visual changes. ENT: No sore throat. Cardiovascular: Denies chest pain. Respiratory: Positive for shortness of breath. Gastrointestinal: No abdominal pain.  No nausea, no vomiting.  No diarrhea.   Genitourinary: Negative for dysuria. Musculoskeletal: Left leg pain. Skin: Negative for rash. Neurological: Negative for headaches, focal weakness or numbness.  ____________________________________________   PHYSICAL EXAM:  VITAL SIGNS: ED Triage Vitals  Enc Vitals Group     BP 03/15/19 1713 115/61     Pulse Rate 03/15/19 1713 75     Resp 03/15/19 1713 20     Temp 03/15/19 1713 98.5 F (36.9 C)     Temp  Source 03/15/19 1713 Oral     SpO2 03/15/19 1713 97 %     Weight 03/15/19 1710 213 lb 13.5 oz (97 kg)     Height 03/15/19 1710 5\' 4"  (1.626 m)     Head Circumference --      Peak Flow --      Pain Score 03/15/19 1706 5   Constitutional: Alert and oriented.  Eyes: Conjunctivae are normal.  ENT      Head: Normocephalic and atraumatic.      Nose: No congestion/rhinnorhea.      Mouth/Throat: Mucous membranes are moist.      Neck: No stridor. Hematological/Lymphatic/Immunilogical: No cervical lymphadenopathy. Cardiovascular: Normal rate, regular rhythm.  No murmurs, rubs, or gallops.  Respiratory: Normal respiratory effort without tachypnea nor retractions.  Diffuse expiratory wheezing.  Gastrointestinal: Soft and non tender. No rebound. No guarding.  Genitourinary: Deferred Musculoskeletal: Normal range of motion in all extremities.  Neurologic:  Normal speech and language. No gross focal neurologic deficits are appreciated.  Skin:  Skin is warm, dry and intact. No rash noted. Psychiatric: Mood and affect are normal. Speech and behavior are normal. Patient exhibits appropriate insight and judgment.  ____________________________________________    LABS (pertinent positives/negatives)  Trop hs <2 CBC wbc 7.8, hgb 13.2, plt 188 BMP na 145, k 3.7, glu 157, cr 1.40  ____________________________________________   EKG  I, 03/17/19, attending physician, personally viewed and interpreted this EKG  EKG Time: 1712 Rate: 74 Rhythm: sinus rhythm Axis: normal Intervals: qtc 524 QRS: narrow ST changes: no st elevation Impression: abnormal ekg   ____________________________________________    RADIOLOGY  CXR New mild enlargement of the cardiopericardial silhouette. Possible mild CHF.   ____________________________________________   PROCEDURES  Procedures  ____________________________________________   INITIAL IMPRESSION / ASSESSMENT AND PLAN / ED  COURSE  Pertinent labs & imaging results that were available during my care of the patient were reviewed by me and considered in my medical decision making (see chart for details).   Patient presented to the emergency department because of concern for shortness of breath. The patient has history of COPD and CHF. The patient did have diffuse wheezing. On CXR there is concern for mild CHF, however patient feels this is more her COPD. Discussed with patient she can take extra dose of her diuretic for the next couple of days. Additionally will give prescription for steroids and abx for COPD exacerbation.  ____________________________________________   FINAL CLINICAL IMPRESSION(S) /  ED DIAGNOSES  Final diagnoses:  Shortness of breath  COPD exacerbation (HCC)  Congestive heart failure, unspecified HF chronicity, unspecified heart failure type [facility])     Note: This dictation was prepared with Dragon dictation. Any transcriptional errors that result from this process are unintentional     Nance Pear, MD 03/15/19 2056

## 2019-03-15 NOTE — ED Provider Notes (Addendum)
[place], [place]   Name: Brenda Wu DOB: [birth year] MRN: [id number] CSN: [id number] PCP: Preston Fleeting, MD  Arrival date and time:  03/15/19 1554  Chief Complaint:  Shortness of Breath   NOTE: Prior to seeing the patient today, I have reviewed the triage nursing documentation and vital signs. Clinical staff has updated patient's PMH/PSHx, current medication list, and drug allergies/intolerances to ensure comprehensive history available to assist in medical decision making.   History:   HPI: Brenda Wu is a 64 y.o. female who presents today with complaints of progressive shortness of breath. PMH (+) for COPD. Patient is on supplemental oxygen (2L/[place]) around the clock. Patient has been using Duonebs at home as prescribed. PMH also (+) mild systolic CHF; last echocardiogram in 2019 revealed an EF of 45-50%. Patient advises that she had surgical procedure on 03/08/2019 at [facility] to remove "painful orthopedic hardware" from her LEFT distal femur. Procedure was performed by Dr. Lavena Stanford of [facility]. Since her procedure, patient notes increasing shortness of breath both at rest and with exertion. She states, "as soon as I come off the oxygen, it is like I cannot breathe". Symptoms are exponentially worse with exertion. Patient ambulatory from front desk to treatment room. Documented SPO2 noted to be 74% on room air. Oximetry was monitored; improved to 89% at rest. Patient wheezing and having difficulties speaking in complete sentences. Patient placed on 2L/[place] supplemental oxygen, which improved saturations to 96%. Patient denies any chest pain or vertiginous symptoms. No cough, fever, or chills. Patient denies being in close contact with anyone known to be ill. She was tested for SARS-CoV-2 (novel coronavirus) prior to her surgery on 03/08/2019; results were negative. Staff directed to call EMS for patient transport to emergency department.   Past Medical History:    Diagnosis Date  . Chronic combined systolic (congestive) and diastolic (congestive) heart failure (HCC)    a. 10/2017 Echo: EF 45-50%, diff HK. Mild to mod MR, mildly dil LA. Mod dil RV w/ mildly reduced RV fxn.  [name] COPD (chronic obstructive pulmonary disease) (HCC)   . Depression   . Diabetes mellitus without complication (HCC)   . Hypercholesteremia   . Hypertension   . Morbid obesity (HCC)   . NICM (nonischemic cardiomyopathy) (HCC)   . NSTEMI (non-ST elevated myocardial infarction) (HCC)    a. 12/2017 -->Cath: nl cors. EF 45-50%.  . Seizures (HCC)   . Sleep apnea   . Stroke (HCC)   . Tricuspid regurgitation    a. 10/2017 Echo: Mild to mod TR.    Past Surgical History:  Procedure Laterality Date  . ABDOMINAL HYSTERECTOMY    . HARDWARE REMOVAL Left 03/08/2019   Procedure: HARDWARE REMOVAL OF DISTAL FEMUR;  Surgeon: Kennedy Bucker, MD;  Location: [facility] ORS;  Service: Orthopedics;  Laterality: Left;  . LEFT HEART CATH AND CORONARY ANGIOGRAPHY N/A 01/09/2018   Procedure: LEFT HEART CATH AND CORONARY ANGIOGRAPHY;  Surgeon: Iran Ouch, MD;  Location: [facility] INVASIVE CV LAB;  Service: Cardiovascular;  Laterality: N/A;  . ORIF FEMUR FRACTURE Left 05/12/2018   Procedure: OPEN REDUCTION INTERNAL FIXATION (ORIF) DISTAL FEMUR FRACTURE;  Surgeon: Kennedy Bucker, MD;  Location: [facility] ORS;  Service: Orthopedics;  Laterality: Left;  . REPLACEMENT TOTAL KNEE      Family History  Problem Relation Age of Onset  . Hypertension Father   . Asthma Sister   . Heart murmur Sister   . Diabetes Sister     Social History   Tobacco  Use  . Smoking status: Former Smoker    Packs/day: 1.00    Years: 30.00    Pack years: 30.00    Types: Cigarettes    Quit date: 02/22/2002    Years since quitting: 17.0  . Smokeless tobacco: Never Used  Substance Use Topics  . Alcohol use: No  . Drug use: No    Patient Active Problem List   Diagnosis Date Noted  . Fracture of prosthetic knee ([place]) 05/12/2018  .  NICM (nonischemic cardiomyopathy) ([place]) 02/13/2018  . Non-ST elevation (NSTEMI) myocardial infarction ([place])   . Chronic combined systolic and diastolic heart failure ([place]) 12/12/2017  . HTN (hypertension) 12/12/2017  . Diabetes ([place]) 12/12/2017  . COPD exacerbation ([place]) 02/07/2017  . History of cerebellar stroke 03/12/2016  . History of seizures 03/12/2016  . Pulmonary nodule, left 01/05/2016  . Paroxysmal atrial fibrillation ([place]) 10/17/2015  . Cerebellar stroke ([place]) 06/26/2015  . Transient diplopia 06/09/2015  . Asthma-chronic obstructive pulmonary disease overlap syndrome ([place]) 05/30/2015  . COPD with asthma ([place]) 05/30/2015  . Hypoxemia 05/30/2015  . Obstructive sleep apnea syndrome 05/30/2015  . OSA on CPAP 05/30/2015  . Obesity (BMI 30-39.9) 03/12/2015  . Bilateral hearing loss 01/22/2015  . Generalized seizure disorder ([place]) 01/22/2015  . Hyperlipidemia associated with type 2 diabetes mellitus ([place]) 01/22/2015  . Hypertension associated with diabetes ([place]) 01/22/2015  . Morbid obesity due to excess calories ([place]) 01/22/2015  . Primary osteoarthritis involving multiple joints 01/22/2015  . Severe episode of recurrent major depressive disorder, without psychotic features ([place]) 01/22/2015    Home Medications:    No outpatient medications have been marked as taking for the 03/15/19 encounter [facility] Encounter).    Allergies:   Bismuth subsalicylate  Review of Systems (ROS): Review of Systems  Constitutional: Negative for chills and fever.  HENT: Negative for congestion, rhinorrhea, sinus pressure, sinus pain and sore throat.   Respiratory: Positive for chest tightness and shortness of breath. Negative for cough.   Cardiovascular: Negative for chest pain, palpitations and leg swelling.  Gastrointestinal: Negative for abdominal pain, diarrhea, nausea and vomiting.  Skin: Negative for color change, pallor and rash.  Neurological: Negative for dizziness, syncope,  weakness, light-headedness and headaches.  Psychiatric/Behavioral: The patient is nervous/anxious.   All other systems reviewed and are negative.    Vital Signs: Today's Vitals   03/15/19 1603 03/15/19 1604 03/15/19 1650  BP: 103/66    Pulse: 86    Resp: (!) 28    SpO2: (!) 74% (!) 74%   Weight: 216 lb (98 kg)    Height: 5\' 4"  (1.626 m)    PainSc:   0-No pain    Physical Exam: Physical Exam  Constitutional: She is oriented to person, place, and time. She appears distressed.  HENT:  Head: Normocephalic and atraumatic.  Nose: Nose normal.  Mouth/Throat: Uvula is midline, oropharynx is clear and moist and mucous membranes are normal.  Eyes: Pupils are equal, round, and reactive to light.  Cardiovascular: Normal rate, regular rhythm, normal heart sounds and intact distal pulses.  Pulmonary/Chest: Tachypnea noted. She is in respiratory distress. She has wheezes (Diffuse).  No cough noted in clinic. Significant SOB and increased WOB. Initial RR 28-30. Moderate distress. Unble to speak in complete sentences. SPO2 74% on RA. PMH (+) COPD and CHF. On supplemental oxygen (2L/[place]) ATC at home.   Abdominal: Soft. Normal appearance and bowel sounds are normal. There is no abdominal tenderness.  Neurological: She is alert and oriented to person, place,  and time. Gait normal.  Skin: Skin is warm and dry. No rash noted. She is not diaphoretic.  Psychiatric: Memory, affect and judgment normal. Her mood appears anxious.  Nursing note and vitals reviewed.   Urgent Care Treatments / Results:   Orders Placed This Encounter  Procedures  . ED EKG  . EKG 12-Lead    LABS: PLEASE NOTE: all labs that were ordered this encounter are listed, however only abnormal results are displayed. Labs Reviewed - No data to display  URGENT CARE ECG REPORT Date: 03/16/2019 Time ECG obtained: 1615 PM Rate: 77 bpm Rhythm: normal sinus rhythm Intervals: PR 122 ms, QTc 450 ms ST segment and T wave changes: No  acute changes. No STEMI.  Comparison: Similar to previous tracing obtained on 03/06/2019  RADIOLOGY: No results found.  PROCEDURES: Procedures  MEDICATIONS RECEIVED THIS VISIT: Medications - No data to display  PERTINENT CLINICAL COURSE NOTES/UPDATES:   Initial Impression / Assessment and Plan / Urgent Care Course:  Pertinent labs & imaging results that were available during my care of the patient were personally reviewed by me and considered in my medical decision making (see lab/imaging section of note for values and interpretations).  Brenda Wu is a 64 y.o. female who presents to [facility] today with complaints of Shortness of Breath  Patient presents to clinic today in acute respiratory distress. Presenting symptoms (see HPI) and exam as documented above. Initial SPO2 74% on RA. WOB and RR increased. Patient placed on supplemental oxygen at 2L/[place], which improved saturations to 96%. Patient reports feeling some better. EKG shows NSR with no acute ST changes. Patient is POD #7 following hardware removal from her LEFT femur. She has an extensive PMH that is significant for CHF, COPD, HTN, HLD, OSAH, T2DM, previous CVA, previous NSTEMI. Presenting respiratory distress likely multi-factorial and relate to exacerbations of her chronic conditions (COPD and CHF). However, with that being said, patient is also as risk for VTE following recent surgical procedure. Patient's presenting symptoms today are felt to require further evaluation and intervention in a setting that is capable of providing a higher level of care. Patient is going to need labs, CXR, IV medications, and +/- CT imaging of the chest to rule out pulmonary embolism. I discussed with patient that her problems today are outside of the capabilities of this outpatient urgent care setting, thus my recommendations are to have her seen in the emergency department and that she would need to go by EMS due to her condition. Patient  initially refusing to go to the hospital. Patient advised that her condition is emergent and that delaying care, based on current condition, could likely result in respiratory failure that would result in her death. [facility] has already been notified, as patient being treated here is simply not a feasible option due to her care needs. Patient agrees to EMS transport following our conversation. Staff directed to place PIV in anticipation of rapid intervention should breathing not improve here. Solu-Medrol order given to nursing staff by provider.   [facility] arrives just as IV steroid orders given. EMS electing to load and go due to patient condition. Patient will be given steroids and magnesium dose en route to the ED in efforts to avoid delaying transport.  Patient report called to [facility] emergency department staff; spoke with Lajuana Ripple, RN (charge nurse). Nurse was advised of patient's presenting complaints, assessment in clinic, findings from work up thus far, and plans for patient to present there for ongoing evaluation and  management of her symptoms. Questions fielded. Nurse advised to return a call to [facility] with any questions or concerns pertaining to the care that [facility] received here today. Hospital staff aware that patient will be presenting to their facility via POV today.   Final Clinical Impressions / Urgent Care Diagnoses:   Final diagnoses:  Respiratory distress  SOB (shortness of breath)  Hypoxia  Wheezing    New Prescriptions:  [place] Controlled Substance Registry consulted? Not Applicable  No orders of the defined types were placed in this encounter.   Recommended Follow up Care:   Follow-up Information    Go to  [facility] EMERGENCY DEPARTMENT.   Specialty: Emergency Medicine Why: EMS from [facility] to [facility] for further evaluation and treatment. Based on findings, may require admission. Contact information: [address] [id number] [address] [place] [address] [phone]        NOTE: This note was prepared using Dragon dictation software along with smaller phrase technology. Despite my best ability to proofread, there is the potential that transcriptional errors may still occur from this process, and are completely unintentional.     Verlee Monte, NP 03/16/19 1849

## 2019-03-15 NOTE — Discharge Instructions (Addendum)
Please seek medical attention for any high fevers, chest pain, shortness of breath, change in behavior, persistent vomiting, bloody stool or any other new or concerning symptoms.  

## 2019-03-20 NOTE — Progress Notes (Signed)
Office Visit    Patient Name: Brenda Wu Date of Encounter: 03/21/2019  Primary Care Provider:  Theotis Burrow, MD Primary Cardiologist:  Ida Rogue, MD Electrophysiologist:  None   Chief Complaint    Brenda Wu is a 64 y.o. female with a hx of chronic combined systolic and diastolic heart failure, DM2, HLD, COPD, CVA, depression, HTN, seizures, previous tobacco abuse presents today for follow up after ED visit for SOB.    Past Medical History    Past Medical History:  Diagnosis Date  . Chronic combined systolic (congestive) and diastolic (congestive) heart failure ([place])    a. 10/2017 Echo: EF 45-50%, diff HK. Mild to mod MR, mildly dil LA. Mod dil RV w/ mildly reduced RV fxn.  [name] COPD (chronic obstructive pulmonary disease) ([place])   . Depression   . Diabetes mellitus without complication ([place])   . Hypercholesteremia   . Hypertension   . Morbid obesity ([place])   . NICM (nonischemic cardiomyopathy) ([place])   . NSTEMI (non-ST elevated myocardial infarction) ([place])    a. 12/2017 -->Cath: nl cors. EF 45-50%.  . Seizures ([place])   . Sleep apnea   . Stroke ([place])   . Tricuspid regurgitation    a. 10/2017 Echo: Mild to mod TR.   Past Surgical History:  Procedure Laterality Date  . ABDOMINAL HYSTERECTOMY    . HARDWARE REMOVAL Left 03/08/2019   Procedure: HARDWARE REMOVAL OF DISTAL FEMUR;  Surgeon: Hessie Knows, MD;  Location: [facility];  Service: Orthopedics;  Laterality: Left;  . LEFT HEART CATH AND CORONARY ANGIOGRAPHY N/A 01/09/2018   Procedure: LEFT HEART CATH AND CORONARY ANGIOGRAPHY;  Surgeon: Wellington Hampshire, MD;  Location: [facility];  Service: Cardiovascular;  Laterality: N/A;  . ORIF FEMUR FRACTURE Left 05/12/2018   Procedure: OPEN REDUCTION INTERNAL FIXATION (ORIF) DISTAL FEMUR FRACTURE;  Surgeon: Hessie Knows, MD;  Location: [facility];  Service: Orthopedics;  Laterality: Left;  . REPLACEMENT TOTAL KNEE      Allergies  Allergies    Allergen Reactions  . Bismuth Subsalicylate Rash    Pepto Bismol    History of Present Illness    Brenda Wu is a 64 y.o. female with a hx of chronic combined systolic and diastolic heart failure, DM2, HLD, COPD, CVA, depression, HTN, seizures, previous tobacco abuse. She was last seen by Dr. Rockey Situ 01/2018 and by Jackelyn Hoehn, NP of HF clinic 11/22/18.  Cardiac cath 01/09/18 with normal coronary arteries. Echo 12/2017 LVEF 40%, mild MR. Previous echo 11/05/17 with EF 45-50% with mild/moderate MR/TR and normal PA pressure.   Admitted 05/12/18 for fall and femur fracture with subsequent surgery. ED 04/23/18 viral gastroenteritis. ED 04/25/18 with diarrhea, dizziness given IVF.   Hardware removal from left leg 03/08/19. ED visit 03/08/19 for bleeding - no active bleeding noted and recommended to follow up with her surgeon same day.   Urgent care visit 03/15/19 and subsequent ED visit same day. EKG NSR. CXR with new mild enlargement of cardiopericardial silhouette with possible mild CHF. Diffuse wheeze on exam per documentation. She was given steroid and antibiotics for COPD exacerbation. She was recommended to take an extra dose of her diuretic for a few days.   She reports her breathing is "much better "since leaving the ED.  She has not started her prednisone or antibiotic as she lost her prescription.  Her PCP just provided her with a new prescription today plans to pick up today.  She has been using her nebulizer  at home every 4 hours.  Reports no chest pain, pressure, tightness.  No orthopnea, PND, edema.  Checks her weight daily at home reports it has been stable.  EKGs/Labs/Other Studies Reviewed:   The following studies were reviewed today:  CXR 03/15/19 IMPRESSION: New mild enlargement of the cardiopericardial silhouette. Mild diffuse prominence of the parahilar interstitial markings. Findings raise possibility of mild congestive heart failure.   Mild platelike scarring versus  atelectasis in the left mid lung.  EKG: No EKG today as the patient declined.  EKG independently reviewed from ED 03/14/2018 shows sinus rhythm with no acute ST/T wave changes and QTC prolonged at 524.  Recent Labs: 05/12/2018: ALT 17 03/15/2019: BUN 29; Creatinine, Ser 1.40; Hemoglobin 13.2; Platelets 188; Potassium 3.7; Sodium 145  Recent Lipid Panel No results found for: CHOL, TRIG, HDL, CHOLHDL, VLDL, LDLCALC, LDLDIRECT  Home Medications   Current Meds  Medication Sig  . albuterol (PROVENTIL HFA;VENTOLIN HFA) 108 (90 Base) MCG/ACT inhaler Inhale 2 puffs into the lungs 4 (four) times daily as needed for wheezing or shortness of breath.   [name] albuterol (PROVENTIL) (2.5 MG/3ML) 0.083% nebulizer solution Inhale 2.5 mg into the lungs every 6 (six) hours as needed for wheezing or shortness of breath.   [name] aspirin EC 81 MG tablet Take 1 tablet (81 mg total) by mouth daily.  [name] atorvastatin (LIPITOR) 40 MG tablet Take 40 mg by mouth every evening.   . budesonide-formoterol (SYMBICORT) 160-4.5 MCG/ACT inhaler Inhale 2 puffs into the lungs 2 (two) times daily.  . diclofenac (VOLTAREN) 75 MG EC tablet Take 1 tablet (75 mg total) by mouth 2 (two) times daily as needed.  . diltiazem (CARDIZEM CD) 180 MG 24 hr capsule Take 1 capsule (180 mg total) by mouth daily.  [name] docusate sodium (COLACE) 100 MG capsule Take 1 capsule (100 mg total) by mouth 2 (two) times daily.  [name] glipiZIDE (GLUCOTROL) 10 MG tablet Take 10 mg by mouth 2 (two) times daily before a meal.   . HYDROcodone-acetaminophen (NORCO) 5-325 MG tablet Take 1-2 tablets by mouth every 6 (six) hours as needed for moderate pain.  [name] insulin glargine (LANTUS) 100 UNIT/ML injection Inject 15 Units into the skin daily.  [name] lamoTRIgine (LAMICTAL) 200 MG tablet Take 200 mg by mouth 2 (two) times daily.   [name] losartan (COZAAR) 50 MG tablet Take 50 mg by mouth daily.  . Multiple Vitamin (MULTI-VITAMINS) TABS Take 1 tablet by mouth daily.  . niacin 500 MG tablet  Take 500 mg by mouth daily.  . potassium chloride SA (K-DUR,KLOR-CON) 20 MEQ tablet Take 1 tablet (20 mEq total) by mouth daily.  . predniSONE (STERAPRED UNI-PAK 21 TAB) 10 MG (21) TBPK tablet Per packaging instructions  . pregabalin (LYRICA) 75 MG capsule Take 75 mg by mouth 2 (two) times daily.  . sitaGLIPtin (JANUVIA) 50 MG tablet Take 50 mg by mouth daily.  [name] tiotropium (SPIRIVA) 18 MCG inhalation capsule Place 18 mcg into inhaler and inhale daily.  [name] tiZANidine (ZANAFLEX) 4 MG tablet Take 1 tablet (4 mg total) by mouth 2 (two) times daily as needed for muscle spasms.  [name] topiramate (TOPAMAX) 200 MG tablet Take 200 mg by mouth 2 (two) times daily.  [name] torsemide (DEMADEX) 20 MG tablet Take 2 tablets (40 mg total) by mouth daily. Note dose change  . vitamin B-12 (CYANOCOBALAMIN) 500 MCG tablet Take 500 mcg by mouth daily.    Review of Systems      Review of Systems  Constitution: Negative for chills, fever and malaise/fatigue.  Cardiovascular: Positive for dyspnea on exertion. Negative for chest pain, leg swelling, near-syncope, orthopnea, palpitations and syncope.  Respiratory: Positive for shortness of breath and wheezing. Negative for cough.   Gastrointestinal: Negative for nausea and vomiting.  Neurological: Negative for dizziness, light-headedness and weakness.   All other systems reviewed and are otherwise negative except as noted above.  Physical Exam    VS:  BP 120/70 (BP Location: Right Arm, Patient Position: Sitting, Cuff Size: Normal)   Pulse 81   Ht 5\' 4"  (1.626 m)   Wt 218 lb (98.9 kg)   SpO2 90%   BMI 37.42 kg/m  , BMI Body mass index is 37.42 kg/m. GEN: Well nourished, overweight, well developed, in no acute distress. HEENT: normal. Neck: Supple, no JVD, carotid bruits, or masses. Cardiac: RRR, no murmurs, rubs, or gallops. No clubbing, cyanosis, edema.  Radials//PT 2+ and equal bilaterally.  Respiratory:  Respirations regular and unlabored.  Bilateral upper lobes  diminished likely due to body habitus.  Bilateral lower lobes with wheezing GI: Soft, nontender, nondistended, BS + x 4. MS: No deformity or atrophy. Skin: Warm and dry, no rash. Neuro:  Strength and sensation are intact. Psych: Normal affect.   Assessment & Plan    1. Combined systolic and diastolic heart failure/NICM - Appears euvolemic and well compensated on exam.  Anticipate her shortness of breath and dyspnea on exertion are due to COPD exacerbation.  Chest x-ray 03/14/19 in ED was concerning for mild congestive heart failure.  She will complete treatment of prednisone & antibiotics for COPD 6 per patient.  She will call 03/16/19 if her dyspnea does not improve and at that time we will consider repeating echocardiogram.  Continue GDMT torsemide, potassium, losartan, calcium channel blocker.  2. DM2 -follows with PCP. 3. HLD -follows with PCP.  No recent labs available for review, will request.  Continue atorvastatin 40 mg daily. 4. COPD -current COPD exacerbation.  Seen in ED 03/15/2019 and started on prednisone and ABX but she lost the prescription.  Was given a prescription by her PCP-plans to pick up today.  Encouraged her to complete these prescriptions and continue to use her nebulizer at home. 5. Hx CVA -continue aspirin, Lipitor. 6. CKD -careful titration of antihypertensives and diuretics.  Disposition: Follow up in 1 year(s) with Dr. 03/17/2019 or APP   Mariah Milling, NP 03/21/2019, 12:55 PM

## 2019-03-21 ENCOUNTER — Other Ambulatory Visit: Payer: Self-pay

## 2019-03-21 ENCOUNTER — Ambulatory Visit (INDEPENDENT_AMBULATORY_CARE_PROVIDER_SITE_OTHER): Payer: Medicare HMO | Admitting: Family

## 2019-03-21 ENCOUNTER — Encounter: Payer: Self-pay | Admitting: Family

## 2019-03-21 VITALS — BP 120/70 | HR 81 | Ht 64.0 in | Wt 218.0 lb

## 2019-03-21 DIAGNOSIS — I428 Other cardiomyopathies: Secondary | ICD-10-CM | POA: Diagnosis not present

## 2019-03-21 DIAGNOSIS — E119 Type 2 diabetes mellitus without complications: Secondary | ICD-10-CM

## 2019-03-21 DIAGNOSIS — I5042 Chronic combined systolic (congestive) and diastolic (congestive) heart failure: Secondary | ICD-10-CM | POA: Diagnosis not present

## 2019-03-21 DIAGNOSIS — J449 Chronic obstructive pulmonary disease, unspecified: Secondary | ICD-10-CM | POA: Diagnosis not present

## 2019-03-21 DIAGNOSIS — Z794 Long term (current) use of insulin: Secondary | ICD-10-CM

## 2019-03-21 NOTE — Patient Instructions (Addendum)
Medication Instructions:  No medication changes today.  *If you need a refill on your cardiac medications before your next appointment, please call your pharmacy*  Lab Work: No lab work today.   Testing/Procedures: None ordered today.  Follow-Up: At [facility], you and your health needs are our priority.  As part of our continuing mission to provide you with exceptional heart care, we have created designated Provider Care Teams.  These Care Teams include your primary Cardiologist (physician) and Advanced Practice Providers (APPs -  Physician Assistants and Nurse Practitioners) who all work together to provide you with the care you need, when you need it.  Your next appointment:    In March with Clarisa Kindred, NP at [facility] as previously scheduled  In 1 year in-person with Dr. Mariah Milling  Other Instructions   Glad you are feeling a bit better. Hopefully the prednisone and antibiotic will do the trick. If after the prednisone and antibiotic you still feel like your breathing is not back to your normal, please give [place] a call and we will consider doing an echocardiogram (ultrasound of your heart) to make sure the shortness of breath isn't coming from your heart.   Call our office or heart failure clinic if you gain 3 lbs overnight or 5 lbs in 1 week.

## 2019-04-23 ENCOUNTER — Other Ambulatory Visit: Payer: Self-pay | Admitting: Physician Assistant

## 2019-04-23 DIAGNOSIS — Z13820 Encounter for screening for osteoporosis: Secondary | ICD-10-CM

## 2019-05-18 NOTE — Progress Notes (Signed)
Patient ID: Brenda Wu, female    DOB: [birth year], 64 y.o.   MRN: [id number]  HPI  Ms Brenda Wu is a 64 y/o female with a history of DM, hyperlipidemia, , stroke, depression, HTN, seizures, COPD, previous tobacco use and chronic heart failure.   Echo report from 01/06/18 reviewed and showed an EF of 40% along with mild MR. Echo report from 11/05/17 reviewed and showed an EF of 45-50% along with mild/moderate MR/ TR and normal PA pressure.   Catheterization done 01/09/18 showed normal coronary arteries and an EF of 45-50%.  Was in the ED 03/15/19 due to COPD exacerbation and possible HF. Given steroids and antibiotics and released. Was in the ED 03/09/19 due to seratoma/hematoma of left leg where she was evaluated and released.   She presents today for a follow-up visit with a chief complaint of minimal shortness of breath upon moderate exertion. She describes this as chronic in nature having been present for several years. She has associated cough, light-headedness and gradual weight gain along with this. She denies any difficulty sleeping, abdominal distention, palpitations, pedal edema, chest pain, wheezing or fatigue.   Has received her first COVID vaccine.    Past Medical History:  Diagnosis Date  . Chronic combined systolic (congestive) and diastolic (congestive) heart failure ([place])    a. 10/2017 Echo: EF 45-50%, diff HK. Mild to mod MR, mildly dil LA. Mod dil RV w/ mildly reduced RV fxn.  [name] COPD (chronic obstructive pulmonary disease) ([place])   . Depression   . Diabetes mellitus without complication ([place])   . Hypercholesteremia   . Hypertension   . Morbid obesity ([place])   . NICM (nonischemic cardiomyopathy) ([place])   . NSTEMI (non-ST elevated myocardial infarction) ([place])    a. 12/2017 -->Cath: nl cors. EF 45-50%.  . Seizures ([place])   . Sleep apnea   . Stroke ([place])   . Tricuspid regurgitation    a. 10/2017 Echo: Mild to mod TR.   Past Surgical History:  Procedure Laterality Date   . ABDOMINAL HYSTERECTOMY    . HARDWARE REMOVAL Left 03/08/2019   Procedure: HARDWARE REMOVAL OF DISTAL FEMUR;  Surgeon: Hessie Knows, MD;  Location: [facility];  Service: Orthopedics;  Laterality: Left;  . LEFT HEART CATH AND CORONARY ANGIOGRAPHY N/A 01/09/2018   Procedure: LEFT HEART CATH AND CORONARY ANGIOGRAPHY;  Surgeon: Wellington Hampshire, MD;  Location: [facility];  Service: Cardiovascular;  Laterality: N/A;  . ORIF FEMUR FRACTURE Left 05/12/2018   Procedure: OPEN REDUCTION INTERNAL FIXATION (ORIF) DISTAL FEMUR FRACTURE;  Surgeon: Hessie Knows, MD;  Location: [facility];  Service: Orthopedics;  Laterality: Left;  . REPLACEMENT TOTAL KNEE     Family History  Problem Relation Age of Onset  . Hypertension Father   . Asthma Sister   . Heart murmur Sister   . Diabetes Sister    Social History   Tobacco Use  . Smoking status: Former Smoker    Packs/day: 1.00    Years: 30.00    Pack years: 30.00    Types: Cigarettes    Quit date: 02/22/2002    Years since quitting: 17.2  . Smokeless tobacco: Never Used  Substance Use Topics  . Alcohol use: No   Allergies  Allergen Reactions  . Bismuth Subsalicylate Rash    Pepto Bismol   Prior to Admission medications   Medication Sig Start Date End Date Taking? Authorizing Provider  albuterol (PROVENTIL HFA;VENTOLIN HFA) 108 (90 Base) MCG/ACT inhaler Inhale 2 puffs into  the lungs 4 (four) times daily as needed for wheezing or shortness of breath.  01/05/16  Yes [provider]  albuterol (PROVENTIL) (2.5 MG/3ML) 0.083% nebulizer solution Inhale 2.5 mg into the lungs every 6 (six) hours as needed for wheezing or shortness of breath.  10/01/16  Yes [provider]  aspirin EC 81 MG tablet Take 1 tablet (81 mg total) by mouth daily. 02/13/18  Yes [name], MD  atorvastatin (LIPITOR) 40 MG tablet Take 40 mg by mouth every evening.    Yes [provider]  budesonide-formoterol (SYMBICORT) 160-4.5 MCG/ACT  inhaler Inhale 2 puffs into the lungs 2 (two) times daily. 05/22/15  Yes [provider]  diclofenac (VOLTAREN) 75 MG EC tablet Take 1 tablet (75 mg total) by mouth 2 (two) times daily as needed. 01/25/19  Yes Edward Jolly, MD  diltiazem (CARDIZEM CD) 180 MG 24 hr capsule Take 1 capsule (180 mg total) by mouth daily. 08/22/18  Yes Clarisa Kindred A, FNP  docusate sodium (COLACE) 100 MG capsule Take 1 capsule (100 mg total) by mouth 2 (two) times daily. 05/16/18  Yes [name], MD  glipiZIDE (GLUCOTROL) 10 MG tablet Take 10 mg by mouth 2 (two) times daily before a meal.    Yes [provider]  HYDROcodone-acetaminophen (NORCO) 5-325 MG tablet Take 1-2 tablets by mouth every 6 (six) hours as needed for moderate pain. 03/08/19  Yes Kennedy Bucker, MD  insulin glargine (LANTUS) 100 UNIT/ML injection Inject 15 Units into the skin daily.   Yes [provider]  lamoTRIgine (LAMICTAL) 200 MG tablet Take 200 mg by mouth 2 (two) times daily.    Yes [provider]  losartan (COZAAR) 50 MG tablet Take 50 mg by mouth daily.   Yes [provider]  Multiple Vitamin (MULTI-VITAMINS) TABS Take 1 tablet by mouth daily.   Yes [provider]  niacin 500 MG tablet Take 500 mg by mouth daily.   Yes [provider]  potassium chloride SA (K-DUR,KLOR-CON) 20 MEQ tablet Take 1 tablet (20 mEq total) by mouth daily. 02/12/17  Yes [name], MD  pregabalin (LYRICA) 75 MG capsule Take 75 mg by mouth 2 (two) times daily.   Yes [provider]  sitaGLIPtin (JANUVIA) 50 MG tablet Take 50 mg by mouth daily.   Yes [provider]  tiotropium (SPIRIVA) 18 MCG inhalation capsule Place 18 mcg into inhaler and inhale daily.   Yes [provider]  tiZANidine (ZANAFLEX) 4 MG tablet Take 1 tablet (4 mg total) by mouth 2 (two) times daily as needed for muscle spasms. 01/25/19 01/25/20 Yes Edward Jolly, MD  topiramate (TOPAMAX) 200 MG tablet  Take 200 mg by mouth 2 (two) times daily.   Yes [provider]  torsemide (DEMADEX) 20 MG tablet Take 2 tablets (40 mg total) by mouth daily. Note dose change 08/22/18  Yes Kamille Toomey A, FNP  vitamin B-12 (CYANOCOBALAMIN) 500 MCG tablet Take 500 mcg by mouth daily.   Yes [provider]    Review of Systems  Constitutional: Negative for appetite change, fatigue and fever.  HENT: Negative for congestion, postnasal drip and sore throat.   Eyes: Negative.   Respiratory: Positive for cough (dry) and shortness of breath (minimal). Negative for wheezing.   Cardiovascular: Negative for chest pain, palpitations and leg swelling.  Gastrointestinal: Negative for abdominal distention and abdominal pain.  Endocrine: Negative.   Genitourinary: Negative.   Musculoskeletal: Negative for back pain and neck pain.  Skin: Negative.   Allergic/Immunologic: Negative.   Neurological: Positive for light-headedness (last week). Negative for dizziness.  Hematological: Negative for adenopathy. Does not bruise/bleed easily.  Psychiatric/Behavioral: Negative for dysphoric mood and sleep disturbance (wearing oxygen @2L  and CPAP). The patient is not nervous/anxious.    Vitals:   05/21/19 0826  BP: 129/79  Pulse: 89  Resp: 18  SpO2: 94%  Weight: 220 lb 2 oz (99.8 kg)  Height: 5\' 4"  (1.626 m)   Wt Readings from Last 3 Encounters:  05/21/19 220 lb 2 oz (99.8 kg)  03/21/19 218 lb (98.9 kg)  03/15/19 213 lb 13.5 oz (97 kg)   Lab Results  Component Value Date   CREATININE 1.40 (H) 03/15/2019   CREATININE 1.47 (H) 03/08/2019   CREATININE 1.38 (H) 03/06/2019    Physical Exam Vitals and nursing note reviewed.  Constitutional:      Appearance: She is well-developed.  HENT:     Head: Normocephalic and atraumatic.  Neck:     Vascular: No JVD.  Cardiovascular:     Rate and Rhythm: Normal rate and regular rhythm.  Pulmonary:     Effort: Pulmonary effort is normal. No respiratory  distress.     Breath sounds: No wheezing or rales.  Abdominal:     General: There is no distension.     Palpations: Abdomen is soft.  Musculoskeletal:     Cervical back: Normal range of motion and neck supple.     Right lower leg: No tenderness. No edema.     Left lower leg: No tenderness. No edema.  Skin:    General: Skin is warm and dry.  Neurological:     Mental Status: She is oriented to person, place, and time.  Psychiatric:        Mood and Affect: Mood is not anxious.        Behavior: Behavior is not agitated.     Assessment & Plan:  1: Chronic heart failure with mildly reduced ejection fraction- - NYHA class II - euvolemic today - weighing daily and she says that her weight has been fluctuating; reminded to call for an overnight weight gain of >2 pounds or a weekly weight gain of >5 pounds - weight up 10 pounds from last visit here 6 months ago - doing weights and exercise at home - last echo done November 2019; unable to reach schedulers so LM with them to call patient with the appointment date/time - not adding salt to her food. Reviewed the importance of closely following a 2000mg  sodium diet  - saw cardiology 05/04/2019) 03/21/19 - she says that she's received her flu vaccine already - BNP 01/06/18 was 816.0  2: HTN- - BP looks good today - BMP done 03/15/19 reviewed and showed sodium 145, potassium 3.7, creatinine 1.4 and GFR 47 - saw PCP 03/23/19) 01/17/18  3: DM-  - glucose this morning at home was 97 - A1c 05/13/2018 was 9.0%  4: COPD- - wearing oxygen at 2L at bedtime - wearing cpap at night - saw pulmonologist Alwyn Ren) 03/29/19 - going to begin trelegy once she finishes her spiriva   Patient did not bring her medications nor a list. Each medication was verbally reviewed with the patient and she was encouraged to bring the bottles to every visit to confirm accuracy of list.  Return in 6 months or sooner for any questions/problems before then. Should EF  remain <50%, will bring her in sooner to discuss changing medications.

## 2019-05-21 ENCOUNTER — Ambulatory Visit: Payer: Medicare HMO | Attending: Family | Admitting: Family

## 2019-05-21 ENCOUNTER — Encounter: Payer: Self-pay | Admitting: Family

## 2019-05-21 ENCOUNTER — Other Ambulatory Visit: Payer: Self-pay

## 2019-05-21 VITALS — BP 129/79 | HR 89 | Resp 18 | Ht 64.0 in | Wt 220.1 lb

## 2019-05-21 DIAGNOSIS — Z6837 Body mass index (BMI) 37.0-37.9, adult: Secondary | ICD-10-CM | POA: Insufficient documentation

## 2019-05-21 DIAGNOSIS — I110 Hypertensive heart disease with heart failure: Secondary | ICD-10-CM | POA: Diagnosis not present

## 2019-05-21 DIAGNOSIS — I252 Old myocardial infarction: Secondary | ICD-10-CM | POA: Insufficient documentation

## 2019-05-21 DIAGNOSIS — J449 Chronic obstructive pulmonary disease, unspecified: Secondary | ICD-10-CM

## 2019-05-21 DIAGNOSIS — Z79899 Other long term (current) drug therapy: Secondary | ICD-10-CM | POA: Insufficient documentation

## 2019-05-21 DIAGNOSIS — Z9981 Dependence on supplemental oxygen: Secondary | ICD-10-CM | POA: Insufficient documentation

## 2019-05-21 DIAGNOSIS — Z87891 Personal history of nicotine dependence: Secondary | ICD-10-CM | POA: Insufficient documentation

## 2019-05-21 DIAGNOSIS — I10 Essential (primary) hypertension: Secondary | ICD-10-CM

## 2019-05-21 DIAGNOSIS — Z825 Family history of asthma and other chronic lower respiratory diseases: Secondary | ICD-10-CM | POA: Insufficient documentation

## 2019-05-21 DIAGNOSIS — Z833 Family history of diabetes mellitus: Secondary | ICD-10-CM | POA: Diagnosis not present

## 2019-05-21 DIAGNOSIS — Z7951 Long term (current) use of inhaled steroids: Secondary | ICD-10-CM | POA: Diagnosis not present

## 2019-05-21 DIAGNOSIS — I5042 Chronic combined systolic (congestive) and diastolic (congestive) heart failure: Secondary | ICD-10-CM | POA: Insufficient documentation

## 2019-05-21 DIAGNOSIS — Z7982 Long term (current) use of aspirin: Secondary | ICD-10-CM | POA: Insufficient documentation

## 2019-05-21 DIAGNOSIS — Z794 Long term (current) use of insulin: Secondary | ICD-10-CM | POA: Insufficient documentation

## 2019-05-21 DIAGNOSIS — E119 Type 2 diabetes mellitus without complications: Secondary | ICD-10-CM | POA: Insufficient documentation

## 2019-05-21 DIAGNOSIS — Z8673 Personal history of transient ischemic attack (TIA), and cerebral infarction without residual deficits: Secondary | ICD-10-CM | POA: Insufficient documentation

## 2019-05-21 DIAGNOSIS — I5022 Chronic systolic (congestive) heart failure: Secondary | ICD-10-CM

## 2019-05-21 DIAGNOSIS — Z888 Allergy status to other drugs, medicaments and biological substances status: Secondary | ICD-10-CM | POA: Diagnosis not present

## 2019-05-21 DIAGNOSIS — N1831 Chronic kidney disease, stage 3a: Secondary | ICD-10-CM

## 2019-05-21 DIAGNOSIS — Z8249 Family history of ischemic heart disease and other diseases of the circulatory system: Secondary | ICD-10-CM | POA: Diagnosis not present

## 2019-05-21 DIAGNOSIS — G4730 Sleep apnea, unspecified: Secondary | ICD-10-CM | POA: Insufficient documentation

## 2019-05-21 DIAGNOSIS — I509 Heart failure, unspecified: Secondary | ICD-10-CM | POA: Diagnosis present

## 2019-05-21 DIAGNOSIS — E785 Hyperlipidemia, unspecified: Secondary | ICD-10-CM | POA: Insufficient documentation

## 2019-05-21 NOTE — Patient Instructions (Signed)
Continue weighing daily and call for an overnight weight gain of > 2 pounds or a weekly weight gain of >5 pounds. 

## 2019-07-26 ENCOUNTER — Other Ambulatory Visit: Payer: Self-pay

## 2019-07-26 ENCOUNTER — Encounter: Payer: Self-pay | Admitting: Student in an Organized Health Care Education/Training Program

## 2019-07-26 ENCOUNTER — Ambulatory Visit
Payer: Medicare HMO | Attending: Student in an Organized Health Care Education/Training Program | Admitting: Student in an Organized Health Care Education/Training Program

## 2019-07-26 VITALS — BP 127/74 | HR 66 | Temp 97.7°F | Resp 18 | Ht 64.0 in | Wt 215.0 lb

## 2019-07-26 DIAGNOSIS — M12812 Other specific arthropathies, not elsewhere classified, left shoulder: Secondary | ICD-10-CM | POA: Insufficient documentation

## 2019-07-26 DIAGNOSIS — G8929 Other chronic pain: Secondary | ICD-10-CM | POA: Insufficient documentation

## 2019-07-26 DIAGNOSIS — M19012 Primary osteoarthritis, left shoulder: Secondary | ICD-10-CM | POA: Diagnosis present

## 2019-07-26 DIAGNOSIS — M25512 Pain in left shoulder: Secondary | ICD-10-CM | POA: Insufficient documentation

## 2019-07-26 DIAGNOSIS — M75102 Unspecified rotator cuff tear or rupture of left shoulder, not specified as traumatic: Secondary | ICD-10-CM | POA: Diagnosis not present

## 2019-07-26 NOTE — Progress Notes (Signed)
PROVIDER NOTE: Information contained herein reflects review and annotations entered in association with encounter. Interpretation of such information and data should be left to medically-trained personnel. Information provided to patient can be located elsewhere in the medical record under "Patient Instructions". Document created using STT-dictation technology, any transcriptional errors that may result from process are unintentional.    Patient: Brenda Wu  Service Category: E/M  Provider: Gillis Santa, MD  DOB: 03-17-1955  DOS: 07/26/2019  Referring Provider: Theotis Burrow*  MRN: [id number]  Setting: Ambulatory outpatient  PCP: Theotis Burrow, MD  Type: Established Patient  Specialty: Interventional Pain Management    Location: Office  Delivery: Face-to-face     HPI  Reason for encounter: Ms. Colbi Schiltz, a 64 y.o. year old female, is here today for evaluation and management of her Arthritis of left shoulder region [M19.012]. Ms. Vital's primary complain today is Shoulder Pain  Pertinent problems:  1. Arthritis of left shoulder region   2. Left rotator cuff tear arthropathy   3. Chronic left shoulder pain    Pain Assessment: Severity of Chronic pain is reported as a 5 /10. Location: Shoulder Left/ . Onset: More than a month ago. Quality: Constant, Aching. Timing: Constant. Modifying factor(s): nothing. Vitals:  height is '5\' 4"'$  (1.626 m) and weight is 215 lb (97.5 kg). Her temporal temperature is 97.7 F (36.5 C). Her blood pressure is 127/74 and her pulse is 66. Her respiration is 18 and oxygen saturation is 97%.   Patient presents today endorsing left shoulder pain that is worse with arm abduction.  She has a history of left rotator cuff surgery in the past.  We discussed options for her left shoulder pain which could include left shoulder steroid injection versus peripheral nerve stimulation of her axillary nerve.  Both of these procedures were discussed in great  detail.  Patient wants to think about these further.  As needed order placed for shoulder steroid injection.  If she would like to move forward with axillary nerve peripheral nerve stimulation, I recommend that she contact our clinic.  ROS  Constitutional: Denies any fever or chills Gastrointestinal: No reported hemesis, hematochezia, vomiting, or acute GI distress Musculoskeletal: Left shoulder pain especially with anterior arm raise Neurological: No reported episodes of acute onset apraxia, aphasia, dysarthria, agnosia, amnesia, paralysis, loss of coordination, or loss of consciousness   Medication Review  Multi-Vitamins, albuterol, aspirin EC, atorvastatin, budesonide-formoterol, diltiazem, docusate sodium, glipiZIDE, insulin glargine, lamoTRIgine, losartan, niacin, potassium chloride SA, pregabalin, sitaGLIPtin, tiotropium, topiramate, torsemide, and vitamin B-12  History Review  Allergy: Ms. Wendt is allergic to bismuth subsalicylate. Drug: Ms. Lipuma  reports no history of drug use. Alcohol:  reports no history of alcohol use. Tobacco:  reports that she quit smoking about 17 years ago. Her smoking use included cigarettes. She has a 30.00 pack-year smoking history. She has never used smokeless tobacco. Social: Ms. Guardiola  reports that she quit smoking about 17 years ago. Her smoking use included cigarettes. She has a 30.00 pack-year smoking history. She has never used smokeless tobacco. She reports that she does not drink alcohol or use drugs. Medical:  has a past medical history of Chronic combined systolic (congestive) and diastolic (congestive) heart failure (HCC), COPD (chronic obstructive pulmonary disease) ([place]), Depression, Diabetes mellitus without complication ([place]), Hypercholesteremia, Hypertension, Morbid obesity ([place]), NICM (nonischemic cardiomyopathy) ([place]), NSTEMI (non-ST elevated myocardial infarction) ([place]), Seizures ([place]), Sleep apnea, Stroke ([place]), and Tricuspid  regurgitation. Surgical: Ms. Andre  has a past surgical history that  includes Replacement total knee; LEFT HEART CATH AND CORONARY ANGIOGRAPHY (N/A, 01/09/2018); Abdominal hysterectomy; ORIF femur fracture (Left, 05/12/2018); and Hardware Removal (Left, 03/08/2019). Family: family history includes Asthma in her sister; Diabetes in her sister; Heart murmur in her sister; Hypertension in her father.  Laboratory Chemistry Profile   Renal Lab Results  Component Value Date   BUN 29 (H) 03/15/2019   CREATININE 1.40 (H) 03/15/2019   BCR 21 01/26/2018   GFRAA 47 (L) 03/15/2019   GFRNONAA 40 (L) 03/15/2019     Hepatic Lab Results  Component Value Date   AST 31 05/12/2018   ALT 17 05/12/2018   ALBUMIN 3.8 05/12/2018   ALKPHOS 73 05/12/2018   LIPASE 35 04/25/2018     Electrolytes Lab Results  Component Value Date   NA 145 03/15/2019   K 3.7 03/15/2019   CL 103 03/15/2019   CALCIUM 8.6 (L) 03/15/2019   MG 2.0 01/06/2018   PHOS 4.7 (H) 11/24/2017     Bone No results found for: VD25OH, VD125OH2TOT, [id number], [id number], 25OHVITD1, 25OHVITD2, 25OHVITD3, TESTOFREE, TESTOSTERONE   Inflammation (CRP: Acute Phase) (ESR: Chronic Phase) No results found for: CRP, ESRSEDRATE, LATICACIDVEN     Note: Above Lab results reviewed.  Recent Imaging Review  DG Chest Portable 1 View CLINICAL DATA:  Dyspnea, COPD exacerbation  EXAM: PORTABLE CHEST 1 VIEW  COMPARISON:  05/12/2018 chest radiograph.  FINDINGS: Mild enlargement of the cardiopericardial silhouette, increased from prior. Otherwise stable normal mediastinal contour. No pneumothorax. No pleural effusion. Mild diffuse prominence of the parahilar interstitial markings. Mild platelike scarring versus atelectasis in the left mid lung.  IMPRESSION: New mild enlargement of the cardiopericardial silhouette. Mild diffuse prominence of the parahilar interstitial markings. Findings raise possibility of mild congestive heart  failure.  Mild platelike scarring versus atelectasis in the left mid lung.  Electronically Signed   By: Ilona Sorrel M.D.   On: 03/15/2019 18:33 Note: Reviewed        Physical Exam  General appearance: Well nourished, well developed, and well hydrated. In no apparent acute distress Mental status: Alert, oriented x 3 (person, place, & time)       Respiratory: No evidence of acute respiratory distress Eyes: PERLA Vitals: BP 127/74 (BP Location: Right Arm, Patient Position: Sitting, Cuff Size: Normal)   Pulse 66   Temp 97.7 F (36.5 C) (Temporal)   Resp 18   Ht '5\' 4"'$  (1.626 m)   Wt 215 lb (97.5 kg)   SpO2 97%   BMI 36.90 kg/m  BMI: Estimated body mass index is 36.9 kg/m as calculated from the following:   Height as of this encounter: '5\' 4"'$  (1.626 m).   Weight as of this encounter: 215 lb (97.5 kg). Ideal: Ideal body weight: 54.7 kg (120 lb 9.5 oz) Adjusted ideal body weight: 71.8 kg (158 lb 5.7 oz)  Upper Extremity (UE) Exam    Side: Right upper extremity  Side: Left upper extremity  Skin & Extremity Inspection: Skin color, temperature, and hair growth are WNL. No peripheral edema or cyanosis. No masses, redness, swelling, asymmetry, or associated skin lesions. No contractures.  Skin & Extremity Inspection: Evidence of prior arthroplastic surgery  Functional ROM: Unrestricted ROM          Functional ROM: Pain restricted ROM for shoulder and elbow  Muscle Tone/Strength: Functionally intact. No obvious neuro-muscular anomalies detected.  Muscle Tone/Strength: Functionally intact. No obvious neuro-muscular anomalies detected.  Sensory (Neurological): Unimpaired          Sensory (  Neurological): Arthropathic arthralgia          Palpation: No palpable anomalies              Palpation: No palpable anomalies              Provocative Test(s):  Phalen's test: deferred Tinel's test: deferred Apley's scratch test (touch opposite shoulder):  Action 1 (Across chest): deferred Action 2  (Overhead): deferred Action 3 (LB reach): deferred   Provocative Test(s):  Phalen's test: deferred Tinel's test: deferred Apley's scratch test (touch opposite shoulder):  Action 1 (Across chest): Decreased ROM Action 2 (Overhead): Decreased ROM Action 3 (LB reach): Decreased ROM    Presents today in wheelchair.   Assessment   Status Diagnosis  Persistent Persistent Persistent 1. Arthritis of left shoulder region   2. Left rotator cuff tear arthropathy   3. Chronic left shoulder pain      Updated Problems: Problem  Chronic Left Shoulder Pain  Left Rotator Cuff Tear Arthropathy  Arthritis of Left Shoulder Region    Plan of Care   Ms. Tara Schlarb has a current medication list which includes the following long-term medication(s): atorvastatin, diltiazem, glipizide, lamotrigine, potassium chloride sa, pregabalin, topiramate, torsemide, budesonide-formoterol, and tiotropium.  Orders:  Orders Placed This Encounter  Procedures  . SHOULDER INJECTION    For shoulder pain.    Standing Status:   Standing    Number of Occurrences:   1    Standing Expiration Date:   07/25/2020    Scheduling Instructions:     Side: Left     Sedation:without     TIMEFRAME: PRN procedure. (Ms. Kenedy will call when needed.)    Order Specific Question:   Where will this procedure be performed?    Answer:   [facility]    Comments:   [place]   Follow-up plan:   Return if symptoms worsen or fail to improve.   Recent Visits No visits were found meeting these conditions.  Showing recent visits within past 90 days and meeting all other requirements   Today's Visits Date Type Provider Dept  07/26/19 Office Visit Gillis Santa, MD [facility]  Showing today's visits and meeting all other requirements   Future Appointments No visits were found meeting these conditions.  Showing future appointments within next 90 days and meeting all other requirements   I discussed the  assessment and treatment plan with the patient. The patient was provided an opportunity to ask questions and all were answered. The patient agreed with the plan and demonstrated an understanding of the instructions.  Patient advised to call back or seek an in-person evaluation if the symptoms or condition worsens.  Duration of encounter: 53mnutes.  Note by: BGillis Santa MD Date: 07/26/2019; Time: 10:27 AM

## 2019-07-26 NOTE — Progress Notes (Signed)
Safety precautions to be maintained throughout the outpatient stay will include: orient to surroundings, keep bed in low position, maintain call bell within reach at all times, provide assistance with transfer out of bed and ambulation.  

## 2019-07-26 NOTE — Patient Instructions (Addendum)
1. Sprint Peripheral Nerve stimulator for shoulder pain 2. Call if you'd like to move forward

## 2019-11-01 ENCOUNTER — Other Ambulatory Visit: Payer: Self-pay | Admitting: Family Medicine

## 2019-11-01 DIAGNOSIS — Z1231 Encounter for screening mammogram for malignant neoplasm of breast: Secondary | ICD-10-CM

## 2019-11-01 DIAGNOSIS — Z780 Asymptomatic menopausal state: Secondary | ICD-10-CM

## 2019-11-21 ENCOUNTER — Ambulatory Visit: Payer: Medicare HMO | Attending: Family | Admitting: Family

## 2019-11-21 ENCOUNTER — Other Ambulatory Visit: Payer: Self-pay

## 2019-11-21 ENCOUNTER — Encounter: Payer: Self-pay | Admitting: Family

## 2019-11-21 VITALS — BP 108/66 | HR 80 | Resp 18 | Ht 64.0 in | Wt 215.1 lb

## 2019-11-21 DIAGNOSIS — I110 Hypertensive heart disease with heart failure: Secondary | ICD-10-CM | POA: Diagnosis present

## 2019-11-21 DIAGNOSIS — I5022 Chronic systolic (congestive) heart failure: Secondary | ICD-10-CM | POA: Diagnosis not present

## 2019-11-21 DIAGNOSIS — Z9981 Dependence on supplemental oxygen: Secondary | ICD-10-CM | POA: Diagnosis not present

## 2019-11-21 DIAGNOSIS — J449 Chronic obstructive pulmonary disease, unspecified: Secondary | ICD-10-CM | POA: Diagnosis not present

## 2019-11-21 DIAGNOSIS — J4489 Other specified chronic obstructive pulmonary disease: Secondary | ICD-10-CM

## 2019-11-21 DIAGNOSIS — Z87891 Personal history of nicotine dependence: Secondary | ICD-10-CM | POA: Insufficient documentation

## 2019-11-21 DIAGNOSIS — Z794 Long term (current) use of insulin: Secondary | ICD-10-CM | POA: Diagnosis not present

## 2019-11-21 DIAGNOSIS — Z7951 Long term (current) use of inhaled steroids: Secondary | ICD-10-CM | POA: Diagnosis not present

## 2019-11-21 DIAGNOSIS — E119 Type 2 diabetes mellitus without complications: Secondary | ICD-10-CM | POA: Insufficient documentation

## 2019-11-21 DIAGNOSIS — N1831 Chronic kidney disease, stage 3a: Secondary | ICD-10-CM

## 2019-11-21 DIAGNOSIS — Z79899 Other long term (current) drug therapy: Secondary | ICD-10-CM | POA: Diagnosis not present

## 2019-11-21 DIAGNOSIS — Z7982 Long term (current) use of aspirin: Secondary | ICD-10-CM | POA: Insufficient documentation

## 2019-11-21 DIAGNOSIS — I10 Essential (primary) hypertension: Secondary | ICD-10-CM

## 2019-11-21 NOTE — Patient Instructions (Signed)
Natural help with constipation:  Go lightly Brown Cow: 1/2 cup of coffee, 1/2 cup warm prune juice, and Miralax.

## 2019-11-21 NOTE — Progress Notes (Signed)
`  Patient ID: Brenda Wu, female    DOB: 10-Jun-1955, 64 y.o.   MRN: [id number]  HPI  Brenda Wu is a 64 y/o female with a history of DM, hyperlipidemia, , stroke, depression, HTN, seizures, COPD, previous tobacco use and chronic heart failure.   Echo report from 01/06/18 reviewed and showed an EF of 40% along with mild MR. Echo report from 11/05/17 reviewed and showed an EF of 45-50% along with mild/moderate MR/ TR and normal PA pressure.   Catheterization done 01/09/18 showed normal coronary arteries and an EF of 45-50%.  Has not been seen or admitted in the ER in past 6 months.   Was in the ED 03/15/19 due to COPD exacerbation and possible HF. Given steroids and antibiotics and released. Was in the ED 03/09/19 due to seratoma/hematoma of left leg where she was evaluated and released.   She presents today for a follow-up visit with a chief complaint of minimal shortness of breath upon moderate exertion. She describes this as chronic in nature having been present for several years. She has associated seasonal allergies with nasal congestion and cough. Patient endorses sleeping a lot and a poorly fitting c-pap mask. Patient has not had a repeat echo yet.   Has received both COVID vaccines, denies wanting to get booster.   Past Medical History:  Diagnosis Date  . Chronic combined systolic (congestive) and diastolic (congestive) heart failure (HCC)    a. 10/2017 Echo: EF 45-50%, diff HK. Mild to mod MR, mildly dil LA. Mod dil RV w/ mildly reduced RV fxn.  [name] COPD (chronic obstructive pulmonary disease) (HCC)   . Depression   . Diabetes mellitus without complication (HCC)   . Hypercholesteremia   . Hypertension   . Morbid obesity (HCC)   . NICM (nonischemic cardiomyopathy) (HCC)   . NSTEMI (non-ST elevated myocardial infarction) (HCC)    a. 12/2017 -->Cath: nl cors. EF 45-50%.  . Seizures (HCC)   . Sleep apnea   . Stroke (HCC)   . Tricuspid regurgitation    a. 10/2017 Echo: Mild to  mod TR.   Past Surgical History:  Procedure Laterality Date  . ABDOMINAL HYSTERECTOMY    . HARDWARE REMOVAL Left 03/08/2019   Procedure: HARDWARE REMOVAL OF DISTAL FEMUR;  Surgeon: Kennedy Bucker, MD;  Location: [facility];  Service: Orthopedics;  Laterality: Left;  . LEFT HEART CATH AND CORONARY ANGIOGRAPHY N/A 01/09/2018   Procedure: LEFT HEART CATH AND CORONARY ANGIOGRAPHY;  Surgeon: Iran Ouch, MD;  Location: [facility];  Service: Cardiovascular;  Laterality: N/A;  . ORIF FEMUR FRACTURE Left 05/12/2018   Procedure: OPEN REDUCTION INTERNAL FIXATION (ORIF) DISTAL FEMUR FRACTURE;  Surgeon: Kennedy Bucker, MD;  Location: [facility];  Service: Orthopedics;  Laterality: Left;  . REPLACEMENT TOTAL KNEE     Family History  Problem Relation Age of Onset  . Hypertension Father   . Asthma Sister   . Heart murmur Sister   . Diabetes Sister    Social History   Tobacco Use  . Smoking status: Former Smoker    Packs/day: 1.00    Years: 30.00    Pack years: 30.00    Types: Cigarettes    Quit date: 02/22/2002    Years since quitting: 17.7  . Smokeless tobacco: Never Used  Substance Use Topics  . Alcohol use: No   Allergies  Allergen Reactions  . Bismuth Subsalicylate Rash    Pepto Bismol   Prior to Admission medications   Medication Sig  Start Date End Date Taking? Authorizing Provider  albuterol (PROVENTIL HFA;VENTOLIN HFA) 108 (90 Base) MCG/ACT inhaler Inhale 2 puffs into the lungs 4 (four) times daily as needed for wheezing or shortness of breath.  01/05/16  Yes [provider]  albuterol (PROVENTIL) (2.5 MG/3ML) 0.083% nebulizer solution Inhale 2.5 mg into the lungs every 6 (six) hours as needed for wheezing or shortness of breath.  10/01/16  Yes [provider]  aspirin EC 81 MG tablet Take 1 tablet (81 mg total) by mouth daily. 02/13/18  Yes [name], MD  atorvastatin (LIPITOR) 40 MG tablet Take 40 mg by mouth every evening.    Yes [provider]  budesonide-formoterol (SYMBICORT) 160-4.5 MCG/ACT inhaler Inhale 2 puffs into the lungs 2 (two) times daily. 05/22/15  Yes [provider]  diltiazem (CARDIZEM CD) 180 MG 24 hr capsule Take 1 capsule (180 mg total) by mouth daily. 08/22/18  Yes Clarisa Kindred A, FNP  docusate sodium (COLACE) 100 MG capsule Take 1 capsule (100 mg total) by mouth 2 (two) times daily. 05/16/18  Yes [name], MD  Fluticasone-Umeclidin-Vilant (TRELEGY ELLIPTA) 100-62.5-25 MCG/INH AEPB Inhale into the lungs.   Yes [provider]  glipiZIDE (GLUCOTROL) 10 MG tablet Take 10 mg by mouth 2 (two) times daily before a meal.    Yes [provider]  insulin glargine (LANTUS) 100 UNIT/ML injection Inject 15 Units into the skin daily.   Yes [provider]  lamoTRIgine (LAMICTAL) 200 MG tablet Take 200 mg by mouth 2 (two) times daily.    Yes [provider]  losartan (COZAAR) 50 MG tablet Take 50 mg by mouth daily.   Yes [provider]  Multiple Vitamin (MULTI-VITAMINS) TABS Take 1 tablet by mouth daily.   Yes [provider]  niacin 500 MG tablet Take 500 mg by mouth daily.   Yes [provider]  potassium chloride SA (K-DUR,KLOR-CON) 20 MEQ tablet Take 1 tablet (20 mEq total) by mouth daily. 02/12/17  Yes [name], MD  pregabalin (LYRICA) 75 MG capsule Take 75 mg by mouth daily.    Yes [provider]  sitaGLIPtin (JANUVIA) 50 MG tablet Take 50 mg by mouth daily.   Yes [provider]  topiramate (TOPAMAX) 200 MG tablet Take 200 mg by mouth 2 (two) times daily.   Yes [provider]  torsemide (DEMADEX) 20 MG tablet Take 2 tablets (40 mg total) by mouth daily. Note dose change 08/22/18  Yes Hackney, Tina A, FNP  vitamin B-12 (CYANOCOBALAMIN) 500 MCG tablet Take 500 mcg by mouth daily.   Yes [provider]     Review of Systems  Constitutional: Positive for activity change (Patient  reports she has been walking less due to fear of falling). Negative for appetite change, fatigue and fever.  HENT: Negative for congestion, postnasal drip and sore throat.   Eyes: Negative.   Respiratory: Positive for cough ([place]/weather change related) and shortness of breath (minimal). Negative for choking, chest tightness and wheezing.   Cardiovascular: Negative for chest pain, palpitations and leg swelling.  Gastrointestinal: Positive for abdominal pain (Patient reports lower abdominal pain related to constipation) and constipation. Negative for abdominal distention.  Endocrine: Negative.   Genitourinary: Negative.  Negative for difficulty urinating.  Musculoskeletal: Positive for arthralgias (Patient reports shoulder pain and back pain). Negative for back pain and neck pain.  Skin: Negative.   Allergic/Immunologic: Positive for environmental allergies.  Neurological: Positive for light-headedness (last week). Negative for  dizziness.  Hematological: Negative for adenopathy. Does not bruise/bleed easily.  Psychiatric/Behavioral: Negative for dysphoric mood and sleep disturbance (wearing oxygen @2L  and CPAP). The patient is not nervous/anxious.    Vitals with BMI 11/21/2019 07/26/2019 05/21/2019  Height 5\' 4"  5\' 4"  5\' 4"   Weight 215 lbs 2 oz 215 lbs 220 lbs 2 oz  BMI 36.91 36.89 37.77  Systolic 108 127 05/23/2019  Diastolic 66 74 79  Pulse 80 66 89   Lab Results  Component Value Date   CREATININE 1.40 (H) 03/15/2019   CREATININE 1.47 (H) 03/08/2019   CREATININE 1.38 (H) 03/06/2019      Physical Exam Vitals and nursing note reviewed.  Constitutional:      General: She is not in acute distress.    Appearance: Normal appearance. She is well-developed. She is obese. She is not ill-appearing.  HENT:     Head: Normocephalic and atraumatic.     Nose: Congestion (Related to seasonal change) and rhinorrhea present.     Mouth/Throat:     Mouth: Mucous membranes are dry.  Neck:      Vascular: No JVD.  Cardiovascular:     Rate and Rhythm: Normal rate and regular rhythm.     Pulses: Normal pulses.     Heart sounds: Normal heart sounds.  Pulmonary:     Effort: Pulmonary effort is normal. No respiratory distress.     Breath sounds: No wheezing or rales.  Abdominal:     General: There is no distension.     Palpations: Abdomen is soft.  Musculoskeletal:     Cervical back: Normal range of motion and neck supple.     Right lower leg: No tenderness. No edema.     Left lower leg: No tenderness. No edema.  Skin:    General: Skin is warm and dry.  Neurological:     Mental Status: She is alert and oriented to person, place, and time.  Psychiatric:        Mood and Affect: Mood normal. Mood is not anxious.        Behavior: Behavior normal. Behavior is not agitated.        Thought Content: Thought content normal.     Assessment & Plan:  1: Chronic heart failure with mildly reduced ejection fraction- - NYHA class II - euvolemic today - weighing daily and she says that her weight has been fluctuating; reminded to call for an overnight weight gain of >2 pounds or a weekly weight gain of >5 pounds -5lb weight loss since last visit 6 months ago - doing weights and exercise at home - last echo done November 2019, attempting to schedule her one this visit - not adding salt to her food. Reviewed the importance of closely following a 2000mg  sodium diet  - saw cardiology 03/17/2019) 03/21/19 - she says that she has not yet gotten her flu vaccine for the year but plans to. - BNP 01/06/18 was 816.0  2: HTN- - BP looks good today -BMP done 03/15/19 reviewed and showed sodium 145, potassium 3.7, creatinine 1.4 and GFR 47 - Currently trying to find a new PCP.  3: DM-  - glucose this morning at home was 99 - A1c 05/13/2018 was 9.0%, Patient reports she did an A1C for [employer] recently and was told it was 6.0. Those records are not currently available for viewing, so no way to validate  this.   4: COPD- - wearing oxygen at 2L at bedtime - wearing cpap at night  but reports her face mask is poorly fitting.  - saw pulmonologist Karna Christmas) 03/29/19 -Has began trelegy   Patient did not bring her medications nor a list. Each medication was verbally reviewed with the patient and she was encouraged to bring the bottles to every visit to confirm accuracy of list.  Patient to return in 4 months and get her Echo the same day.

## 2020-01-15 ENCOUNTER — Other Ambulatory Visit: Payer: Self-pay

## 2020-01-15 ENCOUNTER — Ambulatory Visit
Admission: RE | Admit: 2020-01-15 | Discharge: 2020-01-15 | Disposition: A | Discharge status: Home / Self Care | Payer: Medicare HMO | Source: Ambulatory Visit | Attending: Family Medicine | Admitting: Family Medicine

## 2020-01-15 DIAGNOSIS — Z780 Asymptomatic menopausal state: Secondary | ICD-10-CM | POA: Insufficient documentation

## 2020-01-15 DIAGNOSIS — Z1231 Encounter for screening mammogram for malignant neoplasm of breast: Secondary | ICD-10-CM | POA: Insufficient documentation

## 2020-01-21 ENCOUNTER — Other Ambulatory Visit: Payer: Self-pay

## 2020-01-21 ENCOUNTER — Inpatient Hospital Stay
Admission: RE | Admit: 2020-01-21 | Discharge: 2020-01-21 | Disposition: A | Discharge status: Home / Self Care | Payer: Self-pay | Source: Ambulatory Visit | Attending: *Deleted | Admitting: *Deleted

## 2020-01-21 ENCOUNTER — Other Ambulatory Visit: Payer: Self-pay | Admitting: *Deleted

## 2020-01-21 DIAGNOSIS — Z1231 Encounter for screening mammogram for malignant neoplasm of breast: Secondary | ICD-10-CM

## 2020-01-22 ENCOUNTER — Ambulatory Visit: Payer: Medicare HMO | Admitting: Gastroenterology

## 2020-02-04 ENCOUNTER — Encounter: Payer: Self-pay | Admitting: Emergency Medicine

## 2020-02-04 ENCOUNTER — Ambulatory Visit
Admission: EM | Admit: 2020-02-04 | Discharge: 2020-02-04 | Disposition: A | Discharge status: Home / Self Care | Payer: Medicare HMO | Attending: Family Medicine | Admitting: Family Medicine

## 2020-02-04 ENCOUNTER — Other Ambulatory Visit: Payer: Self-pay

## 2020-02-04 DIAGNOSIS — M67431 Ganglion, right wrist: Secondary | ICD-10-CM

## 2020-02-04 DIAGNOSIS — J441 Chronic obstructive pulmonary disease with (acute) exacerbation: Secondary | ICD-10-CM | POA: Diagnosis not present

## 2020-02-04 MED ORDER — PREDNISONE 50 MG PO TABS: ORAL_TABLET | ORAL | 0 refills | Status: DC

## 2020-02-04 NOTE — ED Provider Notes (Signed)
[facility]    CSN: [id number] Arrival date & time: 02/04/20  6767      History   Chief Complaint Chief Complaint  Patient presents with  . Wrist Pain   HPI  64 year old female presents with the above complaint.  2 week history of an area of swelling on the right wrist.  She states that it is painful.  5/10 in severity.  Has recently interfered with sleep.  Described as aching in character.  No relieving factors.  Additionally, patient reports ongoing wheezing.  She has known COPD.  She endorses compliance with her home medications.  She states that her sputum is clear.  No other associated symptoms.  No other complaints.  Past Medical History:  Diagnosis Date  . Chronic combined systolic (congestive) and diastolic (congestive) heart failure (HCC)    a. 10/2017 Echo: EF 45-50%, diff HK. Mild to mod MR, mildly dil LA. Mod dil RV w/ mildly reduced RV fxn.  [name] COPD (chronic obstructive pulmonary disease) (HCC)   . Depression   . Diabetes mellitus without complication (HCC)   . Hypercholesteremia   . Hypertension   . Morbid obesity (HCC)   . NICM (nonischemic cardiomyopathy) (HCC)   . NSTEMI (non-ST elevated myocardial infarction) (HCC)    a. 12/2017 -->Cath: nl cors. EF 45-50%.  . Seizures (HCC)   . Sleep apnea   . Stroke (HCC)   . Tricuspid regurgitation    a. 10/2017 Echo: Mild to mod TR.    Patient Active Problem List   Diagnosis Date Noted  . Chronic left shoulder pain 07/26/2019  . Left rotator cuff tear arthropathy 07/26/2019  . Arthritis of left shoulder region 07/26/2019  . Fracture of prosthetic knee (HCC) 05/12/2018  . NICM (nonischemic cardiomyopathy) (HCC) 02/13/2018  . Non-ST elevation (NSTEMI) myocardial infarction (HCC)   . Chronic combined systolic and diastolic heart failure (HCC) 12/12/2017  . HTN (hypertension) 12/12/2017  . Diabetes (HCC) 12/12/2017  . COPD exacerbation (HCC) 02/07/2017  . History of cerebellar stroke 03/12/2016  .  History of seizures 03/12/2016  . Pulmonary nodule, left 01/05/2016  . Paroxysmal atrial fibrillation (HCC) 10/17/2015  . Cerebellar stroke (HCC) 06/26/2015  . Transient diplopia 06/09/2015  . Asthma-chronic obstructive pulmonary disease overlap syndrome (HCC) 05/30/2015  . COPD with asthma (HCC) 05/30/2015  . Hypoxemia 05/30/2015  . Obstructive sleep apnea syndrome 05/30/2015  . OSA on CPAP 05/30/2015  . Obesity (BMI 30-39.9) 03/12/2015  . Bilateral hearing loss 01/22/2015  . Generalized seizure disorder (HCC) 01/22/2015  . Hyperlipidemia associated with type 2 diabetes mellitus (HCC) 01/22/2015  . Hypertension associated with diabetes (HCC) 01/22/2015  . Morbid obesity due to excess calories (HCC) 01/22/2015  . Primary osteoarthritis involving multiple joints 01/22/2015  . Severe episode of recurrent major depressive disorder, without psychotic features (HCC) 01/22/2015    Past Surgical History:  Procedure Laterality Date  . ABDOMINAL HYSTERECTOMY    . HARDWARE REMOVAL Left 03/08/2019   Procedure: HARDWARE REMOVAL OF DISTAL FEMUR;  Surgeon: Kennedy Bucker, MD;  Location: [facility];  Service: Orthopedics;  Laterality: Left;  . LEFT HEART CATH AND CORONARY ANGIOGRAPHY N/A 01/09/2018   Procedure: LEFT HEART CATH AND CORONARY ANGIOGRAPHY;  Surgeon: Iran Ouch, MD;  Location: [facility];  Service: Cardiovascular;  Laterality: N/A;  . ORIF FEMUR FRACTURE Left 05/12/2018   Procedure: OPEN REDUCTION INTERNAL FIXATION (ORIF) DISTAL FEMUR FRACTURE;  Surgeon: Kennedy Bucker, MD;  Location: [facility];  Service: Orthopedics;  Laterality: Left;  .  REPLACEMENT TOTAL KNEE      OB History   No obstetric history on file.      Home Medications    Prior to Admission medications   Medication Sig Start Date End Date Taking? Authorizing Provider  albuterol (PROVENTIL HFA;VENTOLIN HFA) 108 (90 Base) MCG/ACT inhaler Inhale 2 puffs into the lungs 4 (four) times daily as needed for  wheezing or shortness of breath.  01/05/16  Yes [provider]  albuterol (PROVENTIL) (2.5 MG/3ML) 0.083% nebulizer solution Inhale 2.5 mg into the lungs every 6 (six) hours as needed for wheezing or shortness of breath.  10/01/16  Yes [provider]  aspirin EC 81 MG tablet Take 1 tablet (81 mg total) by mouth daily. 02/13/18  Yes [name], MD  atorvastatin (LIPITOR) 40 MG tablet Take 40 mg by mouth every evening.    Yes [provider]  brimonidine (ALPHAGAN) 0.2 % ophthalmic solution  12/31/19  Yes [provider]  budesonide-formoterol (SYMBICORT) 160-4.5 MCG/ACT inhaler Inhale 2 puffs into the lungs 2 (two) times daily. 05/22/15  Yes [provider]  diltiazem (TIAZAC) 180 MG 24 hr capsule  11/24/19  Yes [provider]  docusate sodium (COLACE) 100 MG capsule Take 1 capsule (100 mg total) by mouth 2 (two) times daily. 05/16/18  Yes [name], MD  Fluticasone-Umeclidin-Vilant (TRELEGY ELLIPTA) 100-62.5-25 MCG/INH AEPB Inhale into the lungs.   Yes [provider]  glipiZIDE (GLUCOTROL) 10 MG tablet Take 10 mg by mouth 2 (two) times daily before a meal.    Yes [provider]  HYDROcodone-acetaminophen (NORCO/VICODIN) 5-325 MG tablet Take by mouth. 03/08/19  Yes [provider]  insulin glargine (LANTUS) 100 UNIT/ML injection Inject 15 Units into the skin daily.   Yes [provider]  lamoTRIgine (LAMICTAL) 100 MG tablet  11/24/19  Yes [provider]  losartan (COZAAR) 50 MG tablet Take 50 mg by mouth daily.   Yes [provider]  methocarbamol (ROBAXIN) 500 MG tablet TAKE 1 TABLET BY MOUTH EVERY 6 HOURS AS NEEDED FOR MUSCLE SPASM 06/10/18  Yes [provider]  Multiple Vitamin (MULTI-VITAMINS) TABS Take 1 tablet by mouth daily.   Yes [provider]  niacin 500 MG tablet Take 500 mg by mouth daily.   Yes [provider]  potassium chloride SA  (K-DUR,KLOR-CON) 20 MEQ tablet Take 1 tablet (20 mEq total) by mouth daily. 02/12/17  Yes [name], MD  pregabalin (LYRICA) 75 MG capsule Take 75 mg by mouth daily.    Yes [provider]  sitaGLIPtin (JANUVIA) 50 MG tablet Take 50 mg by mouth daily.   Yes [provider]  topiramate (TOPAMAX) 200 MG tablet Take 200 mg by mouth 2 (two) times daily.   Yes [provider]  torsemide (DEMADEX) 20 MG tablet Take 2 tablets (40 mg total) by mouth daily. Note dose change 08/22/18  Yes Hackney, Tina A, FNP  vitamin B-12 (CYANOCOBALAMIN) 500 MCG tablet Take 500 mcg by mouth daily.   Yes [provider]  predniSONE (DELTASONE) 50 MG tablet 1 tablet daily x 5 days 02/04/20   Tommie Sams, DO    Family History Family History  Problem Relation Age of Onset  . Hypertension Father   . Asthma Sister   . Heart murmur Sister   . Diabetes Sister     Social History Social History   Tobacco Use  . Smoking status: Former Smoker    Packs/day: 1.00  Years: 30.00    Pack years: 30.00    Types: Cigarettes    Quit date: 02/22/2002    Years since quitting: 17.9  . Smokeless tobacco: Never Used  Vaping Use  . Vaping Use: Never used  Substance Use Topics  . Alcohol use: No  . Drug use: No     Allergies   Bismuth subsalicylate   Review of Systems Review of Systems  Respiratory: Positive for wheezing.   Musculoskeletal:       Right wrist nodule.   Physical Exam Triage Vital Signs ED Triage Vitals  Enc Vitals Group     BP 02/04/20 0842 (!) 141/77     Pulse Rate 02/04/20 0842 78     Resp 02/04/20 0842 18     Temp 02/04/20 0842 98 F (36.7 C)     Temp Source 02/04/20 0842 Oral     SpO2 02/04/20 0842 100 %     Weight 02/04/20 0840 223 lb (101.2 kg)     Height 02/04/20 0840 5\' 4"  (1.626 m)     Head Circumference --      Peak Flow --      Pain Score 02/04/20 0840 5     Pain Loc --      Pain Edu? --      Excl. in GC? --    Updated Vital  Signs BP (!) 141/77 (BP Location: Right Arm)   Pulse 78   Temp 98 F (36.7 C) (Oral)   Resp 18   Ht 5\' 4"  (1.626 m)   Wt 101.2 kg   SpO2 100%   BMI 38.28 kg/m   Visual Acuity Right Eye Distance:   Left Eye Distance:   Bilateral Distance:    Right Eye Near:   Left Eye Near:    Bilateral Near:     Physical Exam Vitals and nursing note reviewed.  Constitutional:      General: She is not in acute distress.    Appearance: Normal appearance. She is not ill-appearing.  HENT:     Head: Normocephalic and atraumatic.  Eyes:     General:        Right eye: No discharge.        Left eye: No discharge.     Conjunctiva/sclera: Conjunctivae normal.  Cardiovascular:     Rate and Rhythm: Normal rate and regular rhythm.  Pulmonary:     Effort: Pulmonary effort is normal.     Comments: Diffuse expiratory wheezing. Musculoskeletal:       Hands:     Comments: Right wrist - small, firm, easily movable nodule noted at the labelled location.  Neurological:     Mental Status: She is alert.  Psychiatric:        Mood and Affect: Mood normal.        Behavior: Behavior normal.    UC Treatments / Results  Labs (all labs ordered are listed, but only abnormal results are displayed) Labs Reviewed - No data to display  EKG   Radiology No results found.  Procedures Procedures (including critical care time)  Medications Ordered in UC Medications - No data to display  Initial Impression / Assessment and Plan / UC Course  I have reviewed the triage vital signs and the nursing notes.  Pertinent labs & imaging results that were available during my care of the patient were reviewed by me and considered in my medical decision making (see chart for details).    64 year old female presents with a  ganglion cyst of the right wrist.  Information given regarding [facility].  Patient desires removal.  Additionally, patient is experiencing COPD exacerbation.  Mild.  Placing on prednisone.  No  indication for antibiotics at this time as she does not have an increase in sputum production or purulence.  Final Clinical Impressions(s) / UC Diagnoses   Final diagnoses:  Ganglion of right wrist  COPD exacerbation [facility])     Discharge Instructions     Medication as prescribed.  Please call [facility] [phone]) for an appt.   Take care  Dr. Adriana Simas     ED Prescriptions    Medication Sig Dispense Auth. Provider   predniSONE (DELTASONE) 50 MG tablet 1 tablet daily x 5 days 5 tablet Everlene Other G, DO     PDMP not reviewed this encounter.   Tommie Sams, [place] 02/04/20 [phone]

## 2020-02-04 NOTE — ED Triage Notes (Signed)
Patient c/o knot on her right wrist that started about 2 weeks ago. She denies injury.

## 2020-02-04 NOTE — Discharge Instructions (Addendum)
Medication as prescribed.  Please call [facility] [phone]) for an appt.   Take care  Dr. Adriana Simas

## 2020-03-17 NOTE — Progress Notes (Signed)
`  Patient ID: Brenda Wu, female    DOB: 10/23/1955, 65 y.o.   MRN: [id number]  HPI  Brenda Wu is a 65 y/o female with a history of DM, hyperlipidemia, , stroke, depression, HTN, seizures, COPD, previous tobacco use and chronic heart failure.   Echo report from 01/06/18 reviewed and showed an EF of 40% along with mild MR. Echo report from 11/05/17 reviewed and showed an EF of 45-50% along with mild/moderate MR/ TR and normal PA pressure.   Catheterization done 01/09/18 showed normal coronary arteries and an EF of 45-50%.  Has not been admitted or been in the ED in the last 6 months.    She presents today for a follow-up visit with a chief complaint of minimal pedal edema. She describes this as chronic in nature having been present for several years. Swelling resolves overnight after she has her feet elevated in the bed. She has associated cough (with weather changes) & intermittent joint pain. She denies any difficulty sleeping, dizziness, abdominal distention, palpitations, chest pain, shortness of breath, fatigue or weight gain.   She had her echo done earlier today and overall says that she feels quite well.   Past Medical History:  Diagnosis Date  . Chronic combined systolic (congestive) and diastolic (congestive) heart failure (HCC)    a. 10/2017 Echo: EF 45-50%, diff HK. Mild to mod MR, mildly dil LA. Mod dil RV w/ mildly reduced RV fxn.  [name] COPD (chronic obstructive pulmonary disease) (HCC)   . Depression   . Diabetes mellitus without complication (HCC)   . Hypercholesteremia   . Hypertension   . Morbid obesity (HCC)   . NICM (nonischemic cardiomyopathy) (HCC)   . NSTEMI (non-ST elevated myocardial infarction) (HCC)    a. 12/2017 -->Cath: nl cors. EF 45-50%.  . Seizures (HCC)   . Sleep apnea   . Stroke (HCC)   . Tricuspid regurgitation    a. 10/2017 Echo: Mild to mod TR.   Past Surgical History:  Procedure Laterality Date  . ABDOMINAL HYSTERECTOMY    . HARDWARE  REMOVAL Left 03/08/2019   Procedure: HARDWARE REMOVAL OF DISTAL FEMUR;  Surgeon: Kennedy Bucker, MD;  Location: [facility];  Service: Orthopedics;  Laterality: Left;  . LEFT HEART CATH AND CORONARY ANGIOGRAPHY N/A 01/09/2018   Procedure: LEFT HEART CATH AND CORONARY ANGIOGRAPHY;  Surgeon: Iran Ouch, MD;  Location: [facility];  Service: Cardiovascular;  Laterality: N/A;  . ORIF FEMUR FRACTURE Left 05/12/2018   Procedure: OPEN REDUCTION INTERNAL FIXATION (ORIF) DISTAL FEMUR FRACTURE;  Surgeon: Kennedy Bucker, MD;  Location: [facility];  Service: Orthopedics;  Laterality: Left;  . REPLACEMENT TOTAL KNEE     Family History  Problem Relation Age of Onset  . Hypertension Father   . Asthma Sister   . Heart murmur Sister   . Diabetes Sister    Social History   Tobacco Use  . Smoking status: Former Smoker    Packs/day: 1.00    Years: 30.00    Pack years: 30.00    Types: Cigarettes    Quit date: 02/22/2002    Years since quitting: 18.0  . Smokeless tobacco: Never Used  Substance Use Topics  . Alcohol use: No   Allergies  Allergen Reactions  . Bismuth Subsalicylate Rash    Pepto Bismol   Prior to Admission medications   Medication Sig Start Date End Date Taking? Authorizing Provider  albuterol (PROVENTIL HFA;VENTOLIN HFA) 108 (90 Base) MCG/ACT inhaler Inhale 2 puffs into the  lungs 4 (four) times daily as needed for wheezing or shortness of breath.  01/05/16  Yes [provider]  albuterol (PROVENTIL) (2.5 MG/3ML) 0.083% nebulizer solution Inhale 2.5 mg into the lungs every 6 (six) hours as needed for wheezing or shortness of breath.  10/01/16  Yes [provider]  aspirin EC 81 MG tablet Take 1 tablet (81 mg total) by mouth daily. 02/13/18  Yes [name], MD  atorvastatin (LIPITOR) 40 MG tablet Take 40 mg by mouth every evening.    Yes [provider]  brimonidine (ALPHAGAN) 0.2 % ophthalmic solution  12/31/19  Yes [provider]   budesonide-formoterol (SYMBICORT) 160-4.5 MCG/ACT inhaler Inhale 2 puffs into the lungs 2 (two) times daily. 05/22/15  Yes [provider]  diltiazem (TIAZAC) 180 MG 24 hr capsule  11/24/19  Yes [provider]  docusate sodium (COLACE) 100 MG capsule Take 1 capsule (100 mg total) by mouth 2 (two) times daily. 05/16/18  Yes [name], MD  Fluticasone-Umeclidin-Vilant (TRELEGY ELLIPTA) 100-62.5-25 MCG/INH AEPB Inhale into the lungs.   Yes [provider]  glipiZIDE (GLUCOTROL) 10 MG tablet Take 10 mg by mouth 2 (two) times daily before a meal.    Yes [provider]  HYDROcodone-acetaminophen (NORCO/VICODIN) 5-325 MG tablet Take by mouth. 03/08/19  Yes [provider]  insulin glargine (LANTUS) 100 UNIT/ML injection Inject 15 Units into the skin daily.   Yes [provider]  lamoTRIgine (LAMICTAL) 100 MG tablet  11/24/19  Yes [provider]  losartan (COZAAR) 50 MG tablet Take 50 mg by mouth daily.   Yes [provider]  methocarbamol (ROBAXIN) 500 MG tablet TAKE 1 TABLET BY MOUTH EVERY 6 HOURS AS NEEDED FOR MUSCLE SPASM 06/10/18  Yes [provider]  Multiple Vitamin (MULTI-VITAMINS) TABS Take 1 tablet by mouth daily.   Yes [provider]  niacin 500 MG tablet Take 500 mg by mouth daily.   Yes [provider]  potassium chloride SA (K-DUR,KLOR-CON) 20 MEQ tablet Take 1 tablet (20 mEq total) by mouth daily. 02/12/17  Yes [name], MD  predniSONE (DELTASONE) 50 MG tablet 1 tablet daily x 5 days 02/04/20  Yes Cook, [name], DO  pregabalin (LYRICA) 75 MG capsule Take 75 mg by mouth daily.    Yes [provider]  sitaGLIPtin (JANUVIA) 50 MG tablet Take 50 mg by mouth daily.   Yes [provider]  topiramate (TOPAMAX) 200 MG tablet Take 200 mg by mouth 2 (two) times daily.   Yes [provider]  torsemide (DEMADEX) 20 MG tablet Take 2 tablets (40 mg total) by mouth  daily. Note dose change 08/22/18  Yes [name], FNP  vitamin B-12 (CYANOCOBALAMIN) 500 MCG tablet Take 500 mcg by mouth daily.   Yes [provider]    Review of Systems  Constitutional: Negative for appetite change and fatigue.  HENT: Negative for congestion, postnasal drip and sore throat.   Eyes: Negative.   Respiratory: Positive for cough ([place]/weather change related). Negative for choking, chest tightness, shortness of breath and wheezing.   Cardiovascular: Positive for leg swelling (resolves overnight). Negative for chest pain and palpitations.  Gastrointestinal: Positive for constipation. Negative for abdominal distention and abdominal pain.  Endocrine: Negative.   Genitourinary: Negative.  Negative for difficulty urinating.  Musculoskeletal: Positive for arthralgias (Patient reports shoulder pain and back pain). Negative for back pain and neck pain.  Skin: Negative.   Allergic/Immunologic: Positive for environmental allergies.  Neurological: Negative for dizziness and light-headedness.  Hematological: Negative for adenopathy. Does not bruise/bleed easily.  Psychiatric/Behavioral: Negative for dysphoric mood and sleep disturbance (wearing oxygen @2L  and CPAP). The patient is not nervous/anxious.    Vitals:   03/18/20 1144  BP: 136/76  Pulse: 78  Resp: 18  SpO2: 90%  Weight: 213 lb 4 oz (96.7 kg)  Height: 5\' 4"  (1.626 m)   Wt Readings from Last 3 Encounters:  03/18/20 213 lb 4 oz (96.7 kg)  02/04/20 223 lb (101.2 kg)  11/21/19 215 lb 2 oz (97.6 kg)   Lab Results  Component Value Date   CREATININE 1.40 (H) 03/15/2019   CREATININE 1.47 (H) 03/08/2019   CREATININE 1.38 (H) 03/06/2019    Physical Exam Vitals and nursing note reviewed.  Constitutional:      General: She is not in acute distress.    Appearance: Normal appearance. She is well-developed. She is not ill-appearing.  HENT:     Head: Normocephalic and atraumatic.  Neck:     Vascular: No  JVD.  Cardiovascular:     Rate and Rhythm: Normal rate and regular rhythm.     Pulses: Normal pulses.     Heart sounds: Normal heart sounds.  Pulmonary:     Effort: Pulmonary effort is normal. No respiratory distress.     Breath sounds: No wheezing or rales.  Abdominal:     General: There is no distension.     Palpations: Abdomen is soft.  Musculoskeletal:        General: No tenderness.     Cervical back: Normal range of motion and neck supple.     Right lower leg: No tenderness. No edema.     Left lower leg: No tenderness. No edema.  Skin:    General: Skin is warm and dry.  Neurological:     General: No focal deficit present.     Mental Status: She is alert and oriented to person, place, and time.  Psychiatric:        Mood and Affect: Mood normal. Mood is not anxious.        Behavior: Behavior normal. Behavior is not agitated.        Thought Content: Thought content normal.     Assessment & Plan:  1: Chronic heart failure with mildly reduced ejection fraction- - NYHA class I - euvolemic today - weighing daily; reminded to call for an overnight weight gain of >2 pounds or a weekly weight gain of >5 pounds - weight down 2 pounds since last visit 4 months ago - doing weights and exercise at home - had echo done earlier today - not adding salt to her food. Reviewed the importance of closely following a 2000mg  sodium diet; eating more vegetarian over the last 6 months.  - saw cardiology 03/10/2019) 03/21/19 - received flu vaccine for this season - has received 2 covid vaccines - BNP 01/06/18 was 816.0  2: HTN- - BP looks good today - saw PCP Dan Humphreys) 03/05/20 - BMP done 02/05/20 reviewed and showed sodium 146, potassium 4.5, creatinine 1.0 and GFR 68  3: DM-  - glucose this morning at home was 97 - A1c 02/05/20 was 7.2%   4: COPD- - wearing oxygen at 2L at bedtime - wearing cpap at night but reports her face mask is poorly fitting.  - saw pulmonologist 05/03/20)  11/2719   Patient did not bring her medications nor a list. Each medication was verbally reviewed with the patient and she  was encouraged to bring the bottles to every visit to confirm accuracy of list.  Return in 6 months or sooner for any questions/problems before then.

## 2020-03-18 ENCOUNTER — Encounter: Payer: Self-pay | Admitting: Family

## 2020-03-18 ENCOUNTER — Other Ambulatory Visit: Payer: Self-pay

## 2020-03-18 ENCOUNTER — Ambulatory Visit: Payer: Medicare Other | Admitting: Family

## 2020-03-18 ENCOUNTER — Ambulatory Visit
Admission: RE | Admit: 2020-03-18 | Discharge: 2020-03-18 | Disposition: A | Discharge status: Home / Self Care | Payer: Medicare Other | Source: Ambulatory Visit | Attending: Family | Admitting: Family

## 2020-03-18 VITALS — BP 136/76 | HR 78 | Resp 18 | Ht 64.0 in | Wt 213.2 lb

## 2020-03-18 DIAGNOSIS — I5022 Chronic systolic (congestive) heart failure: Secondary | ICD-10-CM

## 2020-03-18 DIAGNOSIS — Z8673 Personal history of transient ischemic attack (TIA), and cerebral infarction without residual deficits: Secondary | ICD-10-CM | POA: Diagnosis not present

## 2020-03-18 DIAGNOSIS — Z79899 Other long term (current) drug therapy: Secondary | ICD-10-CM | POA: Diagnosis not present

## 2020-03-18 DIAGNOSIS — Z6836 Body mass index (BMI) 36.0-36.9, adult: Secondary | ICD-10-CM | POA: Insufficient documentation

## 2020-03-18 DIAGNOSIS — Z87891 Personal history of nicotine dependence: Secondary | ICD-10-CM | POA: Insufficient documentation

## 2020-03-18 DIAGNOSIS — N1831 Chronic kidney disease, stage 3a: Secondary | ICD-10-CM

## 2020-03-18 DIAGNOSIS — J4489 Other specified chronic obstructive pulmonary disease: Secondary | ICD-10-CM

## 2020-03-18 DIAGNOSIS — I10 Essential (primary) hypertension: Secondary | ICD-10-CM

## 2020-03-18 DIAGNOSIS — I110 Hypertensive heart disease with heart failure: Secondary | ICD-10-CM | POA: Insufficient documentation

## 2020-03-18 DIAGNOSIS — I252 Old myocardial infarction: Secondary | ICD-10-CM | POA: Insufficient documentation

## 2020-03-18 DIAGNOSIS — Z794 Long term (current) use of insulin: Secondary | ICD-10-CM | POA: Diagnosis not present

## 2020-03-18 DIAGNOSIS — E7800 Pure hypercholesterolemia, unspecified: Secondary | ICD-10-CM | POA: Insufficient documentation

## 2020-03-18 DIAGNOSIS — J449 Chronic obstructive pulmonary disease, unspecified: Secondary | ICD-10-CM | POA: Insufficient documentation

## 2020-03-18 DIAGNOSIS — Z7951 Long term (current) use of inhaled steroids: Secondary | ICD-10-CM | POA: Diagnosis not present

## 2020-03-18 DIAGNOSIS — E785 Hyperlipidemia, unspecified: Secondary | ICD-10-CM | POA: Diagnosis not present

## 2020-03-18 DIAGNOSIS — E119 Type 2 diabetes mellitus without complications: Secondary | ICD-10-CM | POA: Diagnosis not present

## 2020-03-18 DIAGNOSIS — Z8249 Family history of ischemic heart disease and other diseases of the circulatory system: Secondary | ICD-10-CM | POA: Insufficient documentation

## 2020-03-18 DIAGNOSIS — I5042 Chronic combined systolic (congestive) and diastolic (congestive) heart failure: Secondary | ICD-10-CM | POA: Diagnosis not present

## 2020-03-18 DIAGNOSIS — E1122 Type 2 diabetes mellitus with diabetic chronic kidney disease: Secondary | ICD-10-CM

## 2020-03-18 LAB — ECHOCARDIOGRAM COMPLETE
AR max vel: 1.94 cm2
AV Area VTI: 2.26 cm2
AV Area mean vel: 1.88 cm2
AV Mean grad: 4.5 mmHg
AV Peak grad: 8.2 mmHg
Ao pk vel: 1.43 m/s
Area-P 1/2: 3.89 cm2
S' Lateral: 3.29 cm

## 2020-03-18 NOTE — Patient Instructions (Signed)
Continue weighing daily and call for an overnight weight gain of > 2 pounds or a weekly weight gain of >5 pounds. 

## 2020-03-18 NOTE — Progress Notes (Signed)
*  PRELIMINARY RESULTS* Echocardiogram 2D Echocardiogram has been performed.  Brenda Wu 03/18/2020, 11:39 AM

## 2020-03-25 ENCOUNTER — Ambulatory Visit: Payer: Medicare Other | Admitting: Family

## 2020-04-14 NOTE — Progress Notes (Unsigned)
Cardiology Office Note  Date:  04/15/2020   ID:  Brunette, Lavalle 09/28/1955, MRN [id number]  PCP:  [name], MD   Chief Complaint  Patient presents with  . Follow-up    Annual follow up. Medications verbally reviewed with patient.     HPI:  65 year old female with  COPD, former smoker,  depression,  Diabetes,  hypertension,  Hyperlipidemia, morbid obesity,   stroke.    diagnostic catheterization which showed normal coronary arteries.   Who presents for follow-up of her shortness of breath, diastolic and systolic CHF  Last seen in clinic by myself December 2019 Seen by one of our providers January 2021 Followed in CHF clinic, last seen March 18, 2020  multiple admissions for respiratory failure and COPD.   Last emergency room visit for shortness of breath COPD January 2021  Echo 02/2020 EF 50%, mild LVH  Knee injury, doing PT Presents today in wheelchair, Using no oxygen, battery problem, needs two instead of one  torsemide  40 daily CR 1.0, BUN 10 HGBA1C 7.2 Total chol 171,LDL 97  EKG personally reviewed by myself on todays visit Shows normal sinus rhythm rate 69 bpm nonspecific ST abnormality precordial leads, no change  PMH:   has a past medical history of Chronic combined systolic (congestive) and diastolic (congestive) heart failure (HCC), COPD (chronic obstructive pulmonary disease) (HCC), Depression, Diabetes mellitus without complication (HCC), Hypercholesteremia, Hypertension, Morbid obesity (HCC), NICM (nonischemic cardiomyopathy) (HCC), NSTEMI (non-ST elevated myocardial infarction) (HCC), Seizures (HCC), Sleep apnea, Stroke (HCC), and Tricuspid regurgitation.  PSH:    Past Surgical History:  Procedure Laterality Date  . ABDOMINAL HYSTERECTOMY    . HARDWARE REMOVAL Left 03/08/2019   Procedure: HARDWARE REMOVAL OF DISTAL FEMUR;  Surgeon: Kennedy Bucker, MD;  Location: [facility];  Service: Orthopedics;  Laterality: Left;  . LEFT HEART CATH  AND CORONARY ANGIOGRAPHY N/A 01/09/2018   Procedure: LEFT HEART CATH AND CORONARY ANGIOGRAPHY;  Surgeon: Iran Ouch, MD;  Location: [facility];  Service: Cardiovascular;  Laterality: N/A;  . ORIF FEMUR FRACTURE Left 05/12/2018   Procedure: OPEN REDUCTION INTERNAL FIXATION (ORIF) DISTAL FEMUR FRACTURE;  Surgeon: Kennedy Bucker, MD;  Location: [facility] OR[facility];  Service: Orthopedics;  Laterality: Left;  . REPLACEMENT TOTAL KNEE      Current Outpatient Medications  Medication Sig Dispense Refill  . albuterol (PROVENTIL HFA;VENTOLIN HFA) 108 (90 Base) MCG/ACT inhaler Inhale 2 puffs into the lungs 4 (four) times daily as needed for wheezing or shortness of breath.     [name] albuterol (PROVENTIL) (2.5 MG/3ML) 0.083% nebulizer solution Inhale 2.5 mg into the lungs every 6 (six) hours as needed for wheezing or shortness of breath.     [name] aspirin EC 81 MG tablet Take 1 tablet (81 mg total) by mouth daily. 32 tablet 0  . atorvastatin (LIPITOR) 40 MG tablet Take 40 mg by mouth every evening.     . brimonidine (ALPHAGAN) 0.2 % ophthalmic solution     . budesonide-formoterol (SYMBICORT) 160-4.5 MCG/ACT inhaler Inhale 2 puffs into the lungs 2 (two) times daily.    [name] diltiazem (TIAZAC) 180 MG 24 hr capsule     . docusate sodium (COLACE) 100 MG capsule Take 1 capsule (100 mg total) by mouth 2 (two) times daily. 10 capsule 0  . Fluticasone-Umeclidin-Vilant (TRELEGY ELLIPTA) 100-62.5-25 MCG/INH AEPB Inhale into the lungs.    [name] glipiZIDE (GLUCOTROL) 10 MG tablet Take 10 mg by mouth 2 (two) times daily before a meal.     .  insulin glargine (LANTUS) 100 UNIT/ML injection Inject 15 Units into the skin daily.    [name] lamoTRIgine (LAMICTAL) 100 MG tablet     . losartan (COZAAR) 50 MG tablet Take 50 mg by mouth daily.    . Multiple Vitamin (MULTI-VITAMINS) TABS Take 1 tablet by mouth daily.    . niacin 500 MG tablet Take 500 mg by mouth daily.    . potassium chloride SA (K-DUR,KLOR-CON) 20 MEQ tablet Take 1 tablet (20  mEq total) by mouth daily. 30 tablet 2  . sitaGLIPtin (JANUVIA) 50 MG tablet Take 50 mg by mouth daily.    [name] topiramate (TOPAMAX) 200 MG tablet Take 200 mg by mouth 2 (two) times daily.    [name] torsemide (DEMADEX) 20 MG tablet Take 2 tablets (40 mg total) by mouth daily. Note dose change 180 tablet 3  . vitamin B-12 (CYANOCOBALAMIN) 500 MCG tablet Take 500 mcg by mouth daily.     No current facility-administered medications for this visit.     Allergies:   Bismuth subsalicylate   Social History:  The patient  reports that she quit smoking about 18 years ago. Her smoking use included cigarettes. She has a 30.00 pack-year smoking history. She has never used smokeless tobacco. She reports that she does not drink alcohol and does not use drugs.   Family History:   family history includes Asthma in her sister; Diabetes in her sister; Heart murmur in her sister; Hypertension in her father.    Review of Systems: Review of Systems  Constitutional: Negative.   Respiratory: Positive for shortness of breath.   Cardiovascular: Negative.   Gastrointestinal: Negative.   Musculoskeletal: Negative.   Neurological: Negative.   Psychiatric/Behavioral: Negative.   All other systems reviewed and are negative.    PHYSICAL EXAM: VS:  BP 120/80 (BP Location: Right Arm, Patient Position: Sitting, Cuff Size: Normal)   Pulse 69   Ht 5\' 4"  (1.626 m)   Wt 219 lb (99.3 kg)   SpO2 95%   BMI 37.59 kg/m  , BMI Body mass index is 37.59 kg/m. Constitutional:  oriented to person, place, and time. No distress.  HENT:  Head: Grossly normal Eyes:  no discharge. No scleral icterus.  Neck: No JVD, no carotid bruits  Cardiovascular: Regular rate and rhythm, no murmurs appreciated Pulmonary/Chest: Clear to auscultation bilaterally, no wheezes or rails Abdominal: Soft.  no distension.  no tenderness.  Musculoskeletal: Normal range of motion Neurological:  normal muscle tone. Coordination normal. No atrophy Skin:  Skin warm and dry Psychiatric: normal affect, pleasant  Recent Labs: No results found for requested labs within last 8760 hours.    Lipid Panel No results found for: CHOL, HDL, LDLCALC, TRIG    Wt Readings from Last 3 Encounters:  04/15/20 219 lb (99.3 kg)  03/18/20 213 lb 4 oz (96.7 kg)  02/04/20 223 lb (101.2 kg)       ASSESSMENT AND PLAN:  Chronic combined systolic and diastolic heart failure (HCC) - Plan: EKG 12-Lead Looks great, euvolemic, No changes made, echo reviewed  NICM (nonischemic cardiomyopathy) (HCC) - Plan: EKG 12-Lead Currently with no symptoms of angina. No further workup at this time. Continue current medication regimen.  Essential hypertension Blood pressure is well controlled on today's visit. No changes made to the medications.  Type 2 diabetes mellitus without complication, with long-term current use of insulin (HCC) Discussed diet, walker   COPD with asthma (HCC) Prior history of COPD exacerbation Off oxygen, battery issues  Hypertension associated  with diabetes (HCC) Diltiazem, losartan and torsemide BP stable  Paroxysmal atrial fibrillation (HCC) Notes indicate history of paroxysmal atrial fibrillation No clear documentation, "may have been 2019, 2020" NSR , stable   Total encounter time more than 25 minutes  Greater than 50% was spent in counseling and coordination of care with the patient    No orders of the defined types were placed in this encounter.    Signed, Dossie Arbour, M.D., Ph.D. 04/15/2020  [facility] [place], [place] [phone]

## 2020-04-15 ENCOUNTER — Other Ambulatory Visit: Payer: Self-pay

## 2020-04-15 ENCOUNTER — Ambulatory Visit (INDEPENDENT_AMBULATORY_CARE_PROVIDER_SITE_OTHER): Payer: Medicare Other | Admitting: Cardiovascular Disease

## 2020-04-15 ENCOUNTER — Encounter: Payer: Self-pay | Admitting: Cardiovascular Disease

## 2020-04-15 VITALS — BP 120/80 | HR 69 | Ht 64.0 in | Wt 219.0 lb

## 2020-04-15 DIAGNOSIS — Z794 Long term (current) use of insulin: Secondary | ICD-10-CM

## 2020-04-15 DIAGNOSIS — E1122 Type 2 diabetes mellitus with diabetic chronic kidney disease: Secondary | ICD-10-CM

## 2020-04-15 DIAGNOSIS — J449 Chronic obstructive pulmonary disease, unspecified: Secondary | ICD-10-CM | POA: Diagnosis not present

## 2020-04-15 DIAGNOSIS — I10 Essential (primary) hypertension: Secondary | ICD-10-CM

## 2020-04-15 DIAGNOSIS — I428 Other cardiomyopathies: Secondary | ICD-10-CM

## 2020-04-15 DIAGNOSIS — N1831 Chronic kidney disease, stage 3a: Secondary | ICD-10-CM

## 2020-04-15 DIAGNOSIS — I5022 Chronic systolic (congestive) heart failure: Secondary | ICD-10-CM

## 2020-04-15 NOTE — Patient Instructions (Signed)
Medication Instructions:  No changes  If you need a refill on your cardiac medications before your next appointment, please call your pharmacy.    Lab work: No new labs needed   If you have labs (blood work) drawn today and your tests are completely normal, you will receive your results only by: . MyChart Message (if you have MyChart) OR . A paper copy in the mail If you have any lab test that is abnormal or we need to change your treatment, we will call you to review the results.   Testing/Procedures: No new testing needed   Follow-Up: At [facility], you and your health needs are our priority.  As part of our continuing mission to provide you with exceptional heart care, we have created designated Provider Care Teams.  These Care Teams include your primary Cardiologist (physician) and Advanced Practice Providers (APPs -  Physician Assistants and Nurse Practitioners) who all work together to provide you with the care you need, when you need it.  . You will need a follow up appointment in 12 months  . Providers on your designated Care Team:   . Christopher Berge, NP . Ryan Dunn, PA-C . Jacquelyn Visser, PA-C  Any Other Special Instructions Will Be Listed Below (If Applicable).  COVID-19 Vaccine Information can be found at: [email or web address] For questions related to vaccine distribution or appointments, please email [email or web address] or call [phone].     

## 2020-08-17 DIAGNOSIS — J449 Chronic obstructive pulmonary disease, unspecified: Secondary | ICD-10-CM | POA: Diagnosis not present

## 2020-09-11 ENCOUNTER — Other Ambulatory Visit: Payer: Self-pay

## 2020-09-11 ENCOUNTER — Encounter: Payer: Self-pay | Admitting: Family

## 2020-09-11 ENCOUNTER — Ambulatory Visit: Payer: Medicare HMO | Attending: Family | Admitting: Family

## 2020-09-11 VITALS — BP 121/69 | HR 76 | Resp 14 | Ht 64.0 in | Wt 214.2 lb

## 2020-09-11 DIAGNOSIS — E7800 Pure hypercholesterolemia, unspecified: Secondary | ICD-10-CM | POA: Insufficient documentation

## 2020-09-11 DIAGNOSIS — J4489 Other specified chronic obstructive pulmonary disease: Secondary | ICD-10-CM

## 2020-09-11 DIAGNOSIS — Z79899 Other long term (current) drug therapy: Secondary | ICD-10-CM | POA: Diagnosis not present

## 2020-09-11 DIAGNOSIS — Z7951 Long term (current) use of inhaled steroids: Secondary | ICD-10-CM | POA: Diagnosis not present

## 2020-09-11 DIAGNOSIS — F32A Depression, unspecified: Secondary | ICD-10-CM | POA: Diagnosis not present

## 2020-09-11 DIAGNOSIS — Z7982 Long term (current) use of aspirin: Secondary | ICD-10-CM | POA: Insufficient documentation

## 2020-09-11 DIAGNOSIS — J449 Chronic obstructive pulmonary disease, unspecified: Secondary | ICD-10-CM

## 2020-09-11 DIAGNOSIS — Z7984 Long term (current) use of oral hypoglycemic drugs: Secondary | ICD-10-CM | POA: Diagnosis not present

## 2020-09-11 DIAGNOSIS — E119 Type 2 diabetes mellitus without complications: Secondary | ICD-10-CM | POA: Insufficient documentation

## 2020-09-11 DIAGNOSIS — Z87891 Personal history of nicotine dependence: Secondary | ICD-10-CM | POA: Diagnosis not present

## 2020-09-11 DIAGNOSIS — I5042 Chronic combined systolic (congestive) and diastolic (congestive) heart failure: Secondary | ICD-10-CM | POA: Diagnosis not present

## 2020-09-11 DIAGNOSIS — Z794 Long term (current) use of insulin: Secondary | ICD-10-CM

## 2020-09-11 DIAGNOSIS — I110 Hypertensive heart disease with heart failure: Secondary | ICD-10-CM | POA: Insufficient documentation

## 2020-09-11 DIAGNOSIS — N1831 Chronic kidney disease, stage 3a: Secondary | ICD-10-CM

## 2020-09-11 DIAGNOSIS — I10 Essential (primary) hypertension: Secondary | ICD-10-CM

## 2020-09-11 DIAGNOSIS — Z9981 Dependence on supplemental oxygen: Secondary | ICD-10-CM | POA: Diagnosis not present

## 2020-09-11 DIAGNOSIS — I081 Rheumatic disorders of both mitral and tricuspid valves: Secondary | ICD-10-CM | POA: Insufficient documentation

## 2020-09-11 DIAGNOSIS — R569 Unspecified convulsions: Secondary | ICD-10-CM | POA: Insufficient documentation

## 2020-09-11 DIAGNOSIS — I5032 Chronic diastolic (congestive) heart failure: Secondary | ICD-10-CM

## 2020-09-11 DIAGNOSIS — Z8673 Personal history of transient ischemic attack (TIA), and cerebral infarction without residual deficits: Secondary | ICD-10-CM | POA: Diagnosis not present

## 2020-09-11 DIAGNOSIS — E1122 Type 2 diabetes mellitus with diabetic chronic kidney disease: Secondary | ICD-10-CM

## 2020-09-11 NOTE — Patient Instructions (Signed)
Continue weighing daily and call for an overnight weight gain of > 2 pounds or a weekly weight gain of >5 pounds. 

## 2020-09-11 NOTE — Progress Notes (Signed)
`  Patient ID: Brenda Wu, female    DOB: 1955-12-08, 65 y.o.   MRN: [id number]  HPI  Ms Brenda Wu is a 65 y/o female with a history of DM, hyperlipidemia, , stroke, depression, HTN, seizures, COPD, previous tobacco use and chronic heart failure.   Echo report from 03/18/20 reviewed and showed an EF of 50-55% along with mild LVH and mild MR. Echo report from 01/06/18 reviewed and showed an EF of 40% along with mild MR. Echo report from 11/05/17 reviewed and showed an EF of 45-50% along with mild/moderate MR/ TR and normal PA pressure.   Catheterization done 01/09/18 showed normal coronary arteries and an EF of 45-50%.  Has not been admitted or been in the ED in the last 6 months.    She presents today for a follow-up visit with a chief complaint of minimal shortness of breath. She describes this as chronic in nature having been present for several years. She says that she notices this mostly at night. She has no other symptoms and specifically denies any dizziness, difficulty sleeping, abdominal distention, palpitations, pedal edema, chest pain, cough, fatigue or weight gain.   Past Medical History:  Diagnosis Date   Chronic combined systolic (congestive) and diastolic (congestive) heart failure (HCC)    a. 10/2017 Echo: EF 45-50%, diff HK. Mild to mod MR, mildly dil LA. Mod dil RV w/ mildly reduced RV fxn.   COPD (chronic obstructive pulmonary disease) (HCC)    Depression    Diabetes mellitus without complication (HCC)    Hypercholesteremia    Hypertension    Morbid obesity (HCC)    NICM (nonischemic cardiomyopathy) (HCC)    NSTEMI (non-ST elevated myocardial infarction) (HCC)    a. 12/2017 -->Cath: nl cors. EF 45-50%.   Seizures (HCC)    Sleep apnea    Stroke [facility])    Tricuspid regurgitation    a. 10/2017 Echo: Mild to mod TR.   Past Surgical History:  Procedure Laterality Date   ABDOMINAL HYSTERECTOMY     HARDWARE REMOVAL Left 03/08/2019   Procedure: HARDWARE REMOVAL OF DISTAL  FEMUR;  Surgeon: Kennedy Bucker, MD;  Location: [facility] OR[facility];  Service: Orthopedics;  Laterality: Left;   LEFT HEART CATH AND CORONARY ANGIOGRAPHY N/A 01/09/2018   Procedure: LEFT HEART CATH AND CORONARY ANGIOGRAPHY;  Surgeon: Iran Ouch, MD;  Location: [facility];  Service: Cardiovascular;  Laterality: N/A;   ORIF FEMUR FRACTURE Left 05/12/2018   Procedure: OPEN REDUCTION INTERNAL FIXATION (ORIF) DISTAL FEMUR FRACTURE;  Surgeon: Kennedy Bucker, MD;  Location: [facility] OR[facility];  Service: Orthopedics;  Laterality: Left;   REPLACEMENT TOTAL KNEE     Family History  Problem Relation Age of Onset   Hypertension Father    Asthma Sister    Heart murmur Sister    Diabetes Sister    Social History   Tobacco Use   Smoking status: Former    Packs/day: 1.00    Years: 30.00    Pack years: 30.00    Types: Cigarettes    Quit date: 02/22/2002    Years since quitting: 18.5   Smokeless tobacco: Never  Substance Use Topics   Alcohol use: No   Allergies  Allergen Reactions   Bismuth Subsalicylate Rash    Pepto Bismol   Prior to Admission medications   Medication Sig Start Date End Date Taking? Authorizing Provider  albuterol (PROVENTIL HFA;VENTOLIN HFA) 108 (90 Base) MCG/ACT inhaler Inhale 2 puffs into the lungs 4 (four) times daily as needed for  wheezing or shortness of breath.  01/05/16  Yes [provider]  albuterol (PROVENTIL) (2.5 MG/3ML) 0.083% nebulizer solution Inhale 2.5 mg into the lungs every 6 (six) hours as needed for wheezing or shortness of breath.  10/01/16  Yes [provider]  aspirin EC 81 MG tablet Take 1 tablet (81 mg total) by mouth daily. 02/13/18  Yes [name], MD  atorvastatin (LIPITOR) 40 MG tablet Take 40 mg by mouth every evening.    Yes [provider]  brimonidine (ALPHAGAN) 0.2 % ophthalmic solution  12/31/19  Yes [provider]  diltiazem (TIAZAC) 180 MG 24 hr capsule  11/24/19  Yes [provider]  docusate  sodium (COLACE) 100 MG capsule Take 1 capsule (100 mg total) by mouth 2 (two) times daily. 05/16/18  Yes [name], MD  ferrous sulfate 324 MG TBEC Take 324 mg by mouth.   Yes [provider]  Fluticasone-Umeclidin-Vilant (TRELEGY ELLIPTA) 100-62.5-25 MCG/INH AEPB Inhale into the lungs 1 day or 1 dose.   Yes [provider]  glipiZIDE (GLUCOTROL) 10 MG tablet Take 10 mg by mouth 2 (two) times daily before a meal.    Yes [provider]  insulin glargine (LANTUS) 100 UNIT/ML injection Inject 15 Units into the skin daily.   Yes [provider]  lamoTRIgine (LAMICTAL) 100 MG tablet 200 mg 2 (two) times daily. 11/24/19  Yes [provider]  losartan (COZAAR) 50 MG tablet Take 50 mg by mouth daily.   Yes [provider]  Multiple Vitamin (MULTI-VITAMINS) TABS Take 1 tablet by mouth daily.   Yes [provider]  niacin 500 MG tablet Take 500 mg by mouth daily.   Yes [provider]  potassium chloride SA (K-DUR,KLOR-CON) 20 MEQ tablet Take 1 tablet (20 mEq total) by mouth daily. 02/12/17  Yes [name], MD  sitaGLIPtin (JANUVIA) 50 MG tablet Take 100 mg by mouth daily.   Yes [provider]  topiramate (TOPAMAX) 200 MG tablet Take 200 mg by mouth 2 (two) times daily.   Yes [provider]  torsemide (DEMADEX) 20 MG tablet Take 2 tablets (40 mg total) by mouth daily. Note dose change 08/22/18  Yes [name], FNP  vitamin B-12 (CYANOCOBALAMIN) 500 MCG tablet Take 500 mcg by mouth daily.   Yes [provider]     Review of Systems  Constitutional:  Negative for appetite change and fatigue.  HENT:  Negative for congestion, postnasal drip and sore throat.   Eyes: Negative.   Respiratory:  Positive for shortness of breath (only during evening). Negative for chest tightness.   Cardiovascular:  Negative for chest pain, palpitations and leg swelling.  Gastrointestinal:  Negative for  abdominal distention and abdominal pain.  Endocrine: Negative.   Genitourinary: Negative.   Musculoskeletal:  Negative for back pain and neck pain.  Skin: Negative.   Allergic/Immunologic: Negative.   Neurological:  Negative for dizziness and light-headedness.  Hematological:  Negative for adenopathy. Does not bruise/bleed easily.  Psychiatric/Behavioral:  Negative for dysphoric mood and sleep disturbance (wearing CPAP; sleeping on multiple pillows chronically). The patient is not nervous/anxious.    Vitals:   09/11/20 0900  BP: 121/69  Pulse: 76  Resp: 14  SpO2: 95%  Weight: 214 lb 4 oz (97.2 kg)  Height: 5\' 4"  (1.626 m)   Wt Readings from Last 3 Encounters:  09/11/20 214 lb 4 oz (97.2 kg)  04/15/20 219 lb (99.3 kg)  03/18/20 213 lb 4 oz (  96.7 kg)   Lab Results  Component Value Date   CREATININE 1.40 (H) 03/15/2019   CREATININE 1.47 (H) 03/08/2019   CREATININE 1.38 (H) 03/06/2019    Physical Exam Vitals and nursing note reviewed.  Constitutional:      Appearance: Normal appearance.  HENT:     Head: Normocephalic and atraumatic.  Cardiovascular:     Rate and Rhythm: Normal rate and regular rhythm.  Pulmonary:     Effort: Pulmonary effort is normal. No respiratory distress.     Breath sounds: No wheezing or rales.  Abdominal:     General: There is no distension.     Palpations: Abdomen is soft.     Tenderness: There is no abdominal tenderness.  Musculoskeletal:        General: No tenderness.     Cervical back: Normal range of motion and neck supple.     Right lower leg: No edema.     Left lower leg: No edema.  Skin:    General: Skin is warm and dry.  Neurological:     General: No focal deficit present.     Mental Status: She is alert and oriented to person, place, and time.  Psychiatric:        Mood and Affect: Mood normal.        Behavior: Behavior normal.        Thought Content: Thought content normal.    Assessment & Plan:  1: Chronic heart failure  with now preserved ejection fraction with structural changes (LVH)- - NYHA class II - euvolemic today - weighing daily; reminded to call for an overnight weight gain of >2 pounds or a weekly weight gain of >5 pounds - weight stable since last visit 6 months ago - doing weights and exercise at home - not adding salt to her food. Reviewed the importance of closely following a 2000mg  sodium diet - saw cardiology ) 04/15/20 - on januvia and losartan - BNP 01/06/18 was 816.0  2: HTN- - BP looks good today (121/69) - saw PCP 01/08/18) 07/09/20 - BMP done 06/04/20 reviewed and showed sodium 141, potassium 4.1, creatinine 1.4 and GFR 46  3: DM-  - glucose this morning at home was 98 - A1c 06/04/20 was 7.6%   4: COPD- - wearing oxygen at 2L at bedtime - wearing cpap at night  - saw pulmonologist 06/06/20) 07/15/20   Patient did not bring her medications nor a list. Each medication was verbally reviewed with the patient and she was encouraged to bring the bottles to every visit to confirm accuracy of list.  Return in 6 months or sooner for any questions/problems before then.

## 2020-09-12 ENCOUNTER — Ambulatory Visit: Payer: Medicare Other | Admitting: Family

## 2020-09-15 DIAGNOSIS — H401131 Primary open-angle glaucoma, bilateral, mild stage: Secondary | ICD-10-CM | POA: Diagnosis not present

## 2020-09-16 DIAGNOSIS — J449 Chronic obstructive pulmonary disease, unspecified: Secondary | ICD-10-CM | POA: Diagnosis not present

## 2020-09-19 DIAGNOSIS — J9610 Chronic respiratory failure, unspecified whether with hypoxia or hypercapnia: Secondary | ICD-10-CM | POA: Diagnosis not present

## 2020-09-19 DIAGNOSIS — J449 Chronic obstructive pulmonary disease, unspecified: Secondary | ICD-10-CM | POA: Diagnosis not present

## 2020-09-19 DIAGNOSIS — J45909 Unspecified asthma, uncomplicated: Secondary | ICD-10-CM | POA: Diagnosis not present

## 2020-09-19 DIAGNOSIS — E661 Drug-induced obesity: Secondary | ICD-10-CM | POA: Diagnosis not present

## 2020-10-06 DIAGNOSIS — Z794 Long term (current) use of insulin: Secondary | ICD-10-CM | POA: Diagnosis not present

## 2020-10-06 DIAGNOSIS — J449 Chronic obstructive pulmonary disease, unspecified: Secondary | ICD-10-CM | POA: Diagnosis not present

## 2020-10-06 DIAGNOSIS — S40022A Contusion of left upper arm, initial encounter: Secondary | ICD-10-CM | POA: Diagnosis not present

## 2020-10-06 DIAGNOSIS — E119 Type 2 diabetes mellitus without complications: Secondary | ICD-10-CM | POA: Diagnosis not present

## 2020-10-17 DIAGNOSIS — J449 Chronic obstructive pulmonary disease, unspecified: Secondary | ICD-10-CM | POA: Diagnosis not present

## 2020-10-20 DIAGNOSIS — J9610 Chronic respiratory failure, unspecified whether with hypoxia or hypercapnia: Secondary | ICD-10-CM | POA: Diagnosis not present

## 2020-10-20 DIAGNOSIS — J449 Chronic obstructive pulmonary disease, unspecified: Secondary | ICD-10-CM | POA: Diagnosis not present

## 2020-10-20 DIAGNOSIS — J45909 Unspecified asthma, uncomplicated: Secondary | ICD-10-CM | POA: Diagnosis not present

## 2020-10-20 DIAGNOSIS — E661 Drug-induced obesity: Secondary | ICD-10-CM | POA: Diagnosis not present

## 2020-11-17 DIAGNOSIS — J449 Chronic obstructive pulmonary disease, unspecified: Secondary | ICD-10-CM | POA: Diagnosis not present

## 2020-11-20 DIAGNOSIS — E661 Drug-induced obesity: Secondary | ICD-10-CM | POA: Diagnosis not present

## 2020-11-20 DIAGNOSIS — J449 Chronic obstructive pulmonary disease, unspecified: Secondary | ICD-10-CM | POA: Diagnosis not present

## 2020-11-20 DIAGNOSIS — J45909 Unspecified asthma, uncomplicated: Secondary | ICD-10-CM | POA: Diagnosis not present

## 2020-11-20 DIAGNOSIS — J9610 Chronic respiratory failure, unspecified whether with hypoxia or hypercapnia: Secondary | ICD-10-CM | POA: Diagnosis not present

## 2020-12-04 DIAGNOSIS — R9431 Abnormal electrocardiogram [ECG] [EKG]: Secondary | ICD-10-CM | POA: Diagnosis not present

## 2020-12-04 DIAGNOSIS — R0789 Other chest pain: Secondary | ICD-10-CM | POA: Diagnosis not present

## 2020-12-04 DIAGNOSIS — U071 COVID-19: Secondary | ICD-10-CM | POA: Diagnosis not present

## 2020-12-04 DIAGNOSIS — Z7982 Long term (current) use of aspirin: Secondary | ICD-10-CM | POA: Diagnosis not present

## 2020-12-04 DIAGNOSIS — R0602 Shortness of breath: Secondary | ICD-10-CM | POA: Diagnosis not present

## 2020-12-04 DIAGNOSIS — M438X4 Other specified deforming dorsopathies, thoracic region: Secondary | ICD-10-CM | POA: Diagnosis not present

## 2020-12-04 DIAGNOSIS — I491 Atrial premature depolarization: Secondary | ICD-10-CM | POA: Diagnosis not present

## 2020-12-10 ENCOUNTER — Other Ambulatory Visit: Payer: Self-pay | Admitting: Family Medicine

## 2020-12-10 ENCOUNTER — Ambulatory Visit
Admission: RE | Admit: 2020-12-10 | Discharge: 2020-12-10 | Disposition: A | Discharge status: Home / Self Care | Payer: Medicare HMO | Source: Ambulatory Visit | Attending: Family Medicine | Admitting: Family Medicine

## 2020-12-10 DIAGNOSIS — R1011 Right upper quadrant pain: Secondary | ICD-10-CM | POA: Diagnosis not present

## 2020-12-10 DIAGNOSIS — R1012 Left upper quadrant pain: Secondary | ICD-10-CM

## 2020-12-10 DIAGNOSIS — K5730 Diverticulosis of large intestine without perforation or abscess without bleeding: Secondary | ICD-10-CM | POA: Diagnosis not present

## 2020-12-10 DIAGNOSIS — U071 COVID-19: Secondary | ICD-10-CM | POA: Diagnosis not present

## 2020-12-10 MED ORDER — IOHEXOL 300 MG/ML  SOLN
100.0000 mL | Freq: Once | INTRAMUSCULAR | Status: AC | PRN
  Administered 2020-12-10: 100 mL via INTRAVENOUS

## 2020-12-17 DIAGNOSIS — J449 Chronic obstructive pulmonary disease, unspecified: Secondary | ICD-10-CM | POA: Diagnosis not present

## 2020-12-20 DIAGNOSIS — J449 Chronic obstructive pulmonary disease, unspecified: Secondary | ICD-10-CM | POA: Diagnosis not present

## 2020-12-20 DIAGNOSIS — J9610 Chronic respiratory failure, unspecified whether with hypoxia or hypercapnia: Secondary | ICD-10-CM | POA: Diagnosis not present

## 2020-12-20 DIAGNOSIS — E661 Drug-induced obesity: Secondary | ICD-10-CM | POA: Diagnosis not present

## 2020-12-20 DIAGNOSIS — J45909 Unspecified asthma, uncomplicated: Secondary | ICD-10-CM | POA: Diagnosis not present

## 2020-12-24 DIAGNOSIS — E119 Type 2 diabetes mellitus without complications: Secondary | ICD-10-CM | POA: Diagnosis not present

## 2020-12-31 DIAGNOSIS — I480 Paroxysmal atrial fibrillation: Secondary | ICD-10-CM | POA: Diagnosis not present

## 2020-12-31 DIAGNOSIS — R1012 Left upper quadrant pain: Secondary | ICD-10-CM | POA: Diagnosis not present

## 2020-12-31 DIAGNOSIS — Z794 Long term (current) use of insulin: Secondary | ICD-10-CM | POA: Diagnosis not present

## 2020-12-31 DIAGNOSIS — R569 Unspecified convulsions: Secondary | ICD-10-CM | POA: Diagnosis not present

## 2020-12-31 DIAGNOSIS — Z23 Encounter for immunization: Secondary | ICD-10-CM | POA: Diagnosis not present

## 2020-12-31 DIAGNOSIS — E119 Type 2 diabetes mellitus without complications: Secondary | ICD-10-CM | POA: Diagnosis not present

## 2021-01-13 DIAGNOSIS — Z1231 Encounter for screening mammogram for malignant neoplasm of breast: Secondary | ICD-10-CM | POA: Diagnosis not present

## 2021-01-13 DIAGNOSIS — R1012 Left upper quadrant pain: Secondary | ICD-10-CM | POA: Diagnosis not present

## 2021-01-13 DIAGNOSIS — E119 Type 2 diabetes mellitus without complications: Secondary | ICD-10-CM | POA: Diagnosis not present

## 2021-01-13 DIAGNOSIS — I480 Paroxysmal atrial fibrillation: Secondary | ICD-10-CM | POA: Diagnosis not present

## 2021-01-13 DIAGNOSIS — G40909 Epilepsy, unspecified, not intractable, without status epilepticus: Secondary | ICD-10-CM | POA: Diagnosis not present

## 2021-01-14 ENCOUNTER — Other Ambulatory Visit: Payer: Self-pay | Admitting: Internal Medicine

## 2021-01-14 DIAGNOSIS — Z1231 Encounter for screening mammogram for malignant neoplasm of breast: Secondary | ICD-10-CM

## 2021-01-17 DIAGNOSIS — J449 Chronic obstructive pulmonary disease, unspecified: Secondary | ICD-10-CM | POA: Diagnosis not present

## 2021-01-18 IMAGING — DX PORTABLE CHEST - 1 VIEW
1 series · 1 of 1 positions shown · non-contrast
Comparison: 01/06/2018 chest radiograph.

CLINICAL DATA: Preoperative for repair of right femur fracture.

EXAM:
PORTABLE CHEST 1 VIEW

[chest ap]
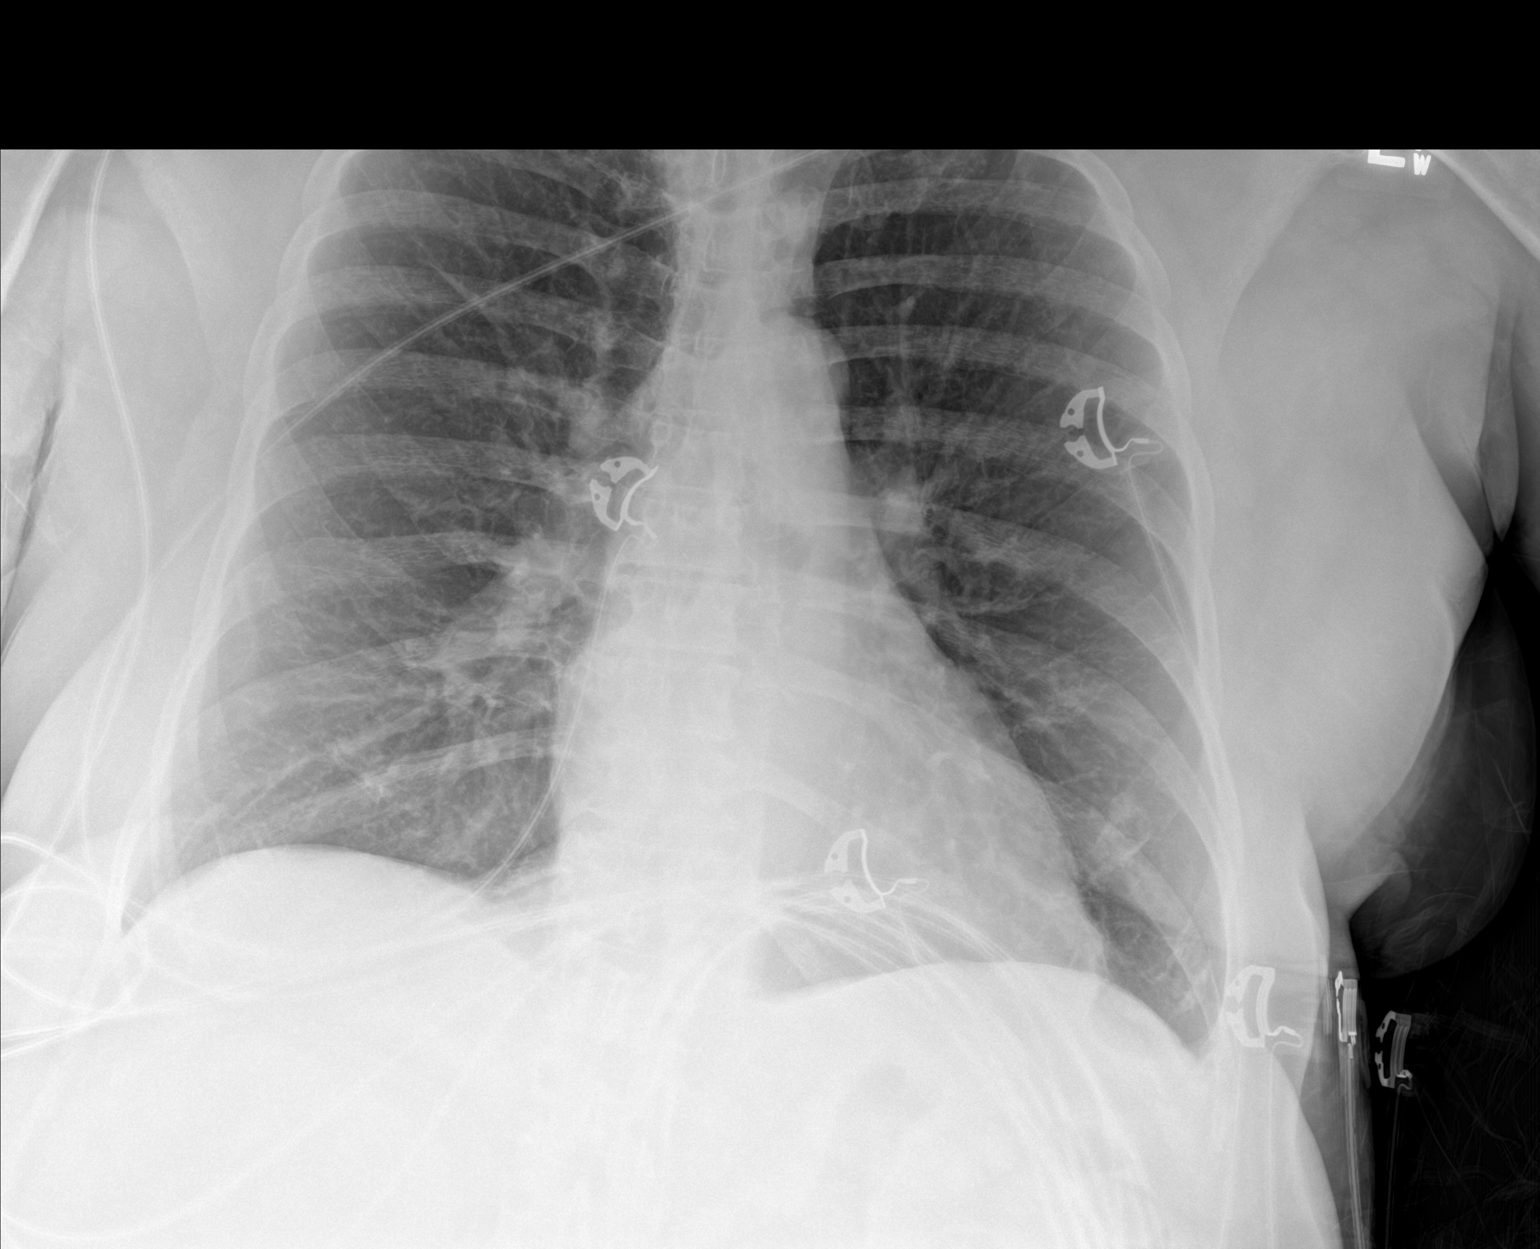

[1 of 1 positions shown; findings below may reference images not displayed]

FINDINGS: Stable cardiomediastinal silhouette with normal heart size. No
pneumothorax. No pleural effusion. Lungs appear clear, with no acute
consolidative airspace disease and no pulmonary edema.
IMPRESSION: No active disease.

## 2021-01-18 IMAGING — DX LEFT KNEE - 1-2 VIEW
2 series · 2 of 2 positions shown · non-contrast
Comparison: Film from earlier in the same day.

CLINICAL DATA: Status post ORIF of distal left femoral fracture

EXAM:
LEFT KNEE - 1-2 VIEW

[knee ap]
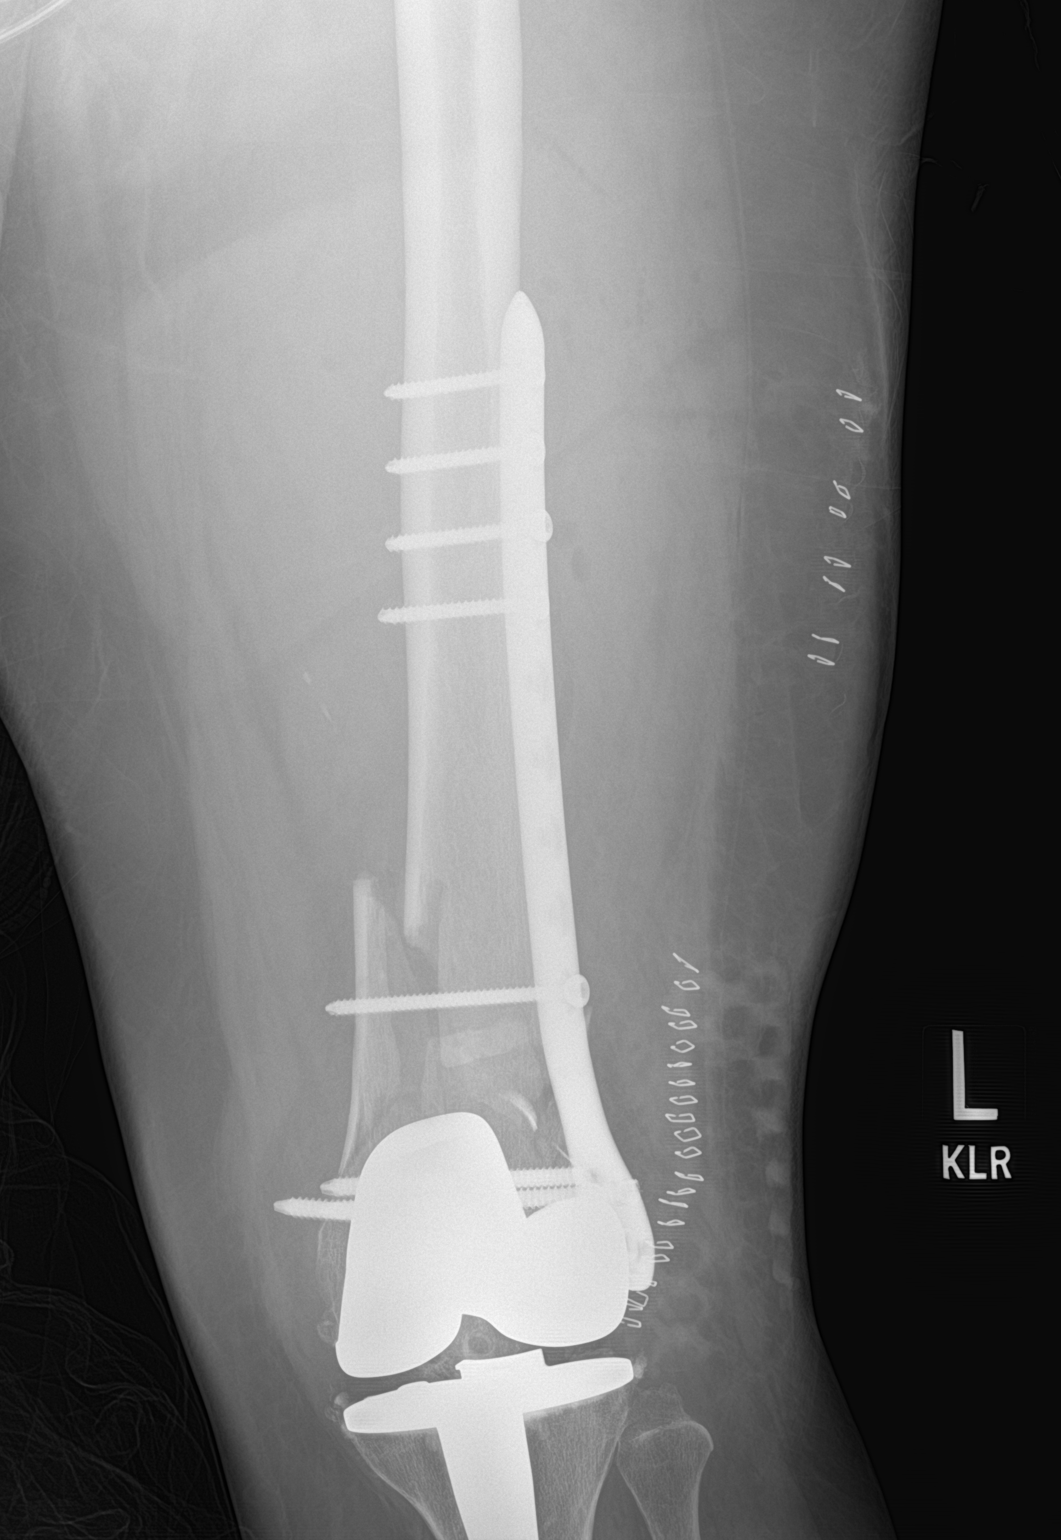

[knee lat]
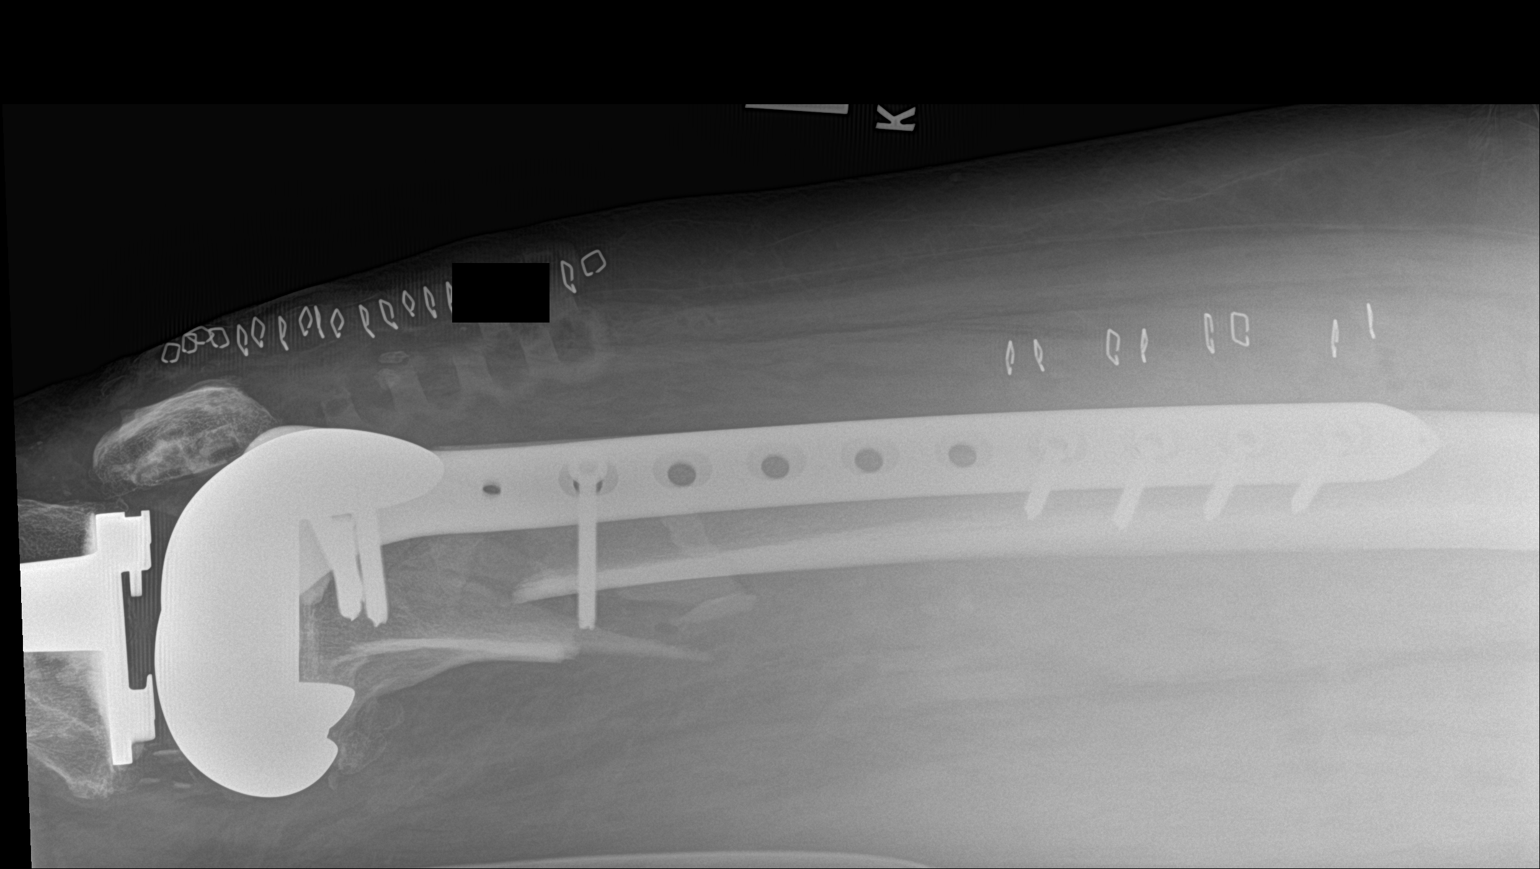

[2 of 2 positions shown; findings below may reference images not displayed]

FINDINGS: Knee replacement is again identified. New lateral fixation sideplate
is noted along the distal femur. Fracture fragments have been
reduced when compared with the prior exam.
IMPRESSION: Status post ORIF of distal left femoral fracture

## 2021-01-18 IMAGING — DX DG HIP (WITH OR WITHOUT PELVIS) 2-3V LEFT
3 series · 3 of 3 positions shown · non-contrast
Comparison: None.

CLINICAL DATA: Left hip pain after mechanical fall

EXAM:
DG HIP (WITH OR WITHOUT PELVIS) 2-3V LEFT

[pelvis ap]
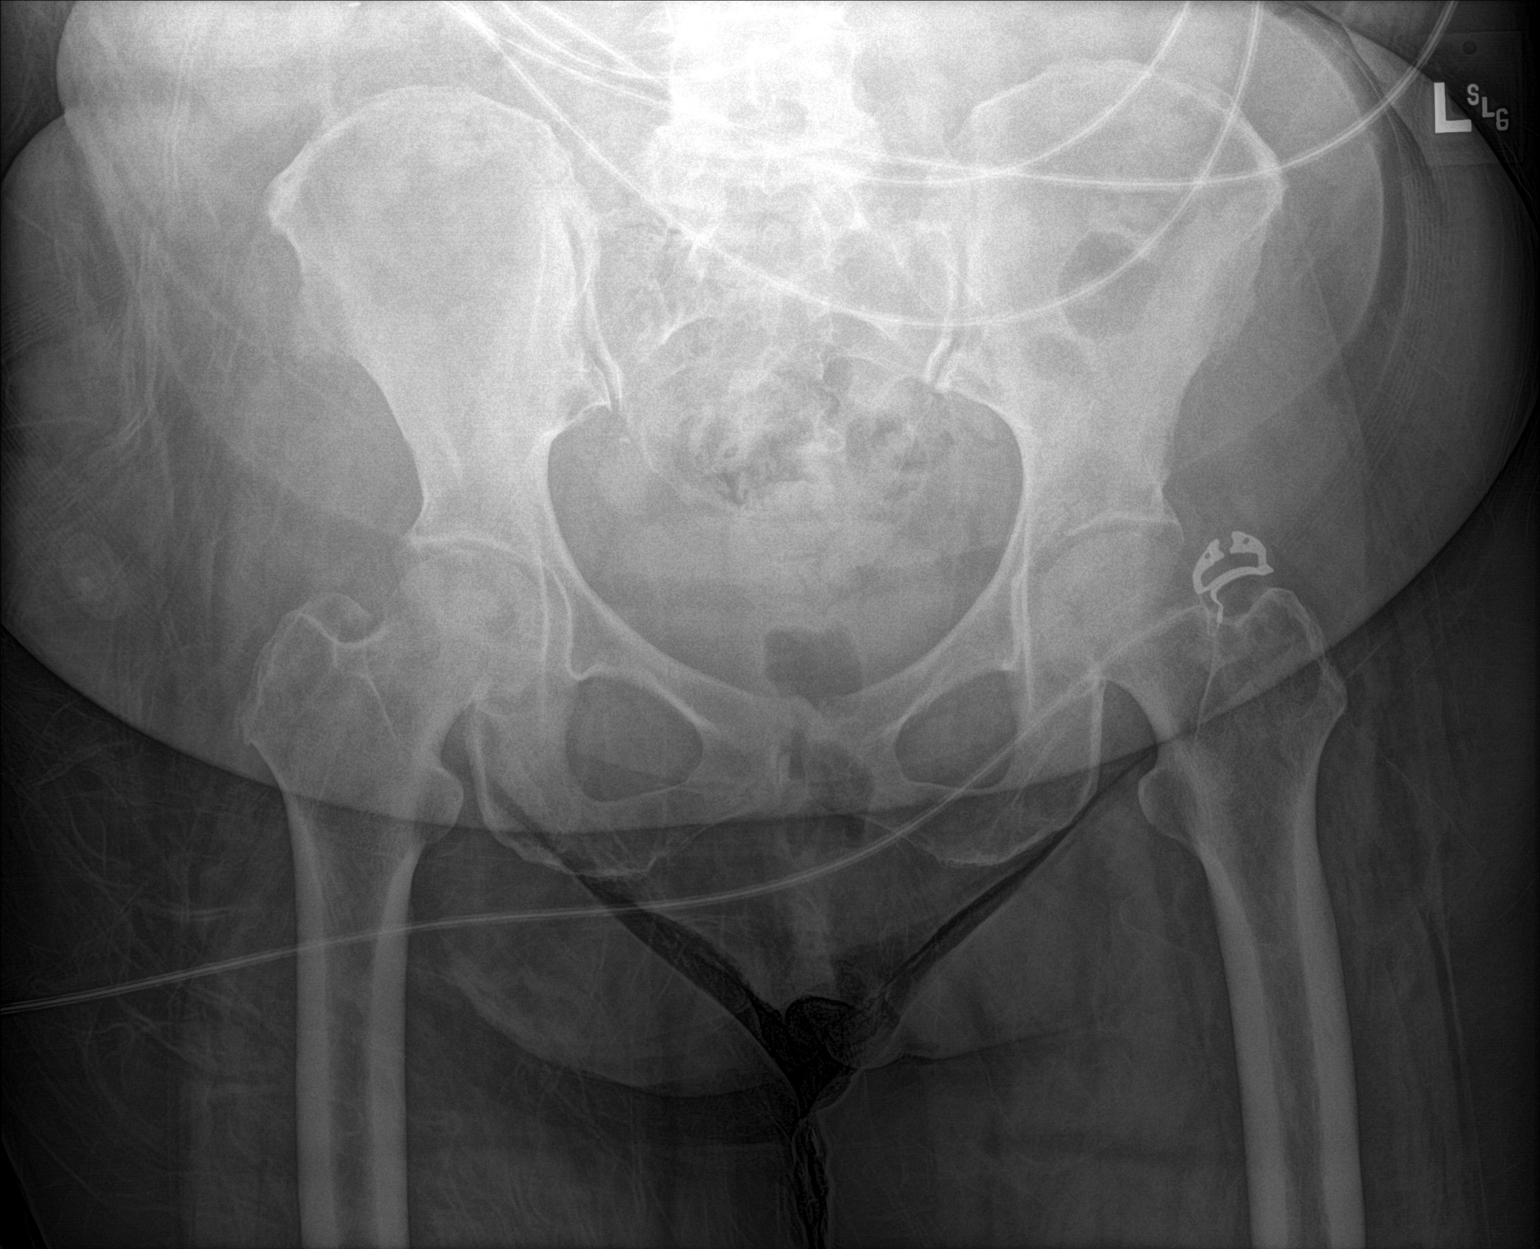

[hip lat]
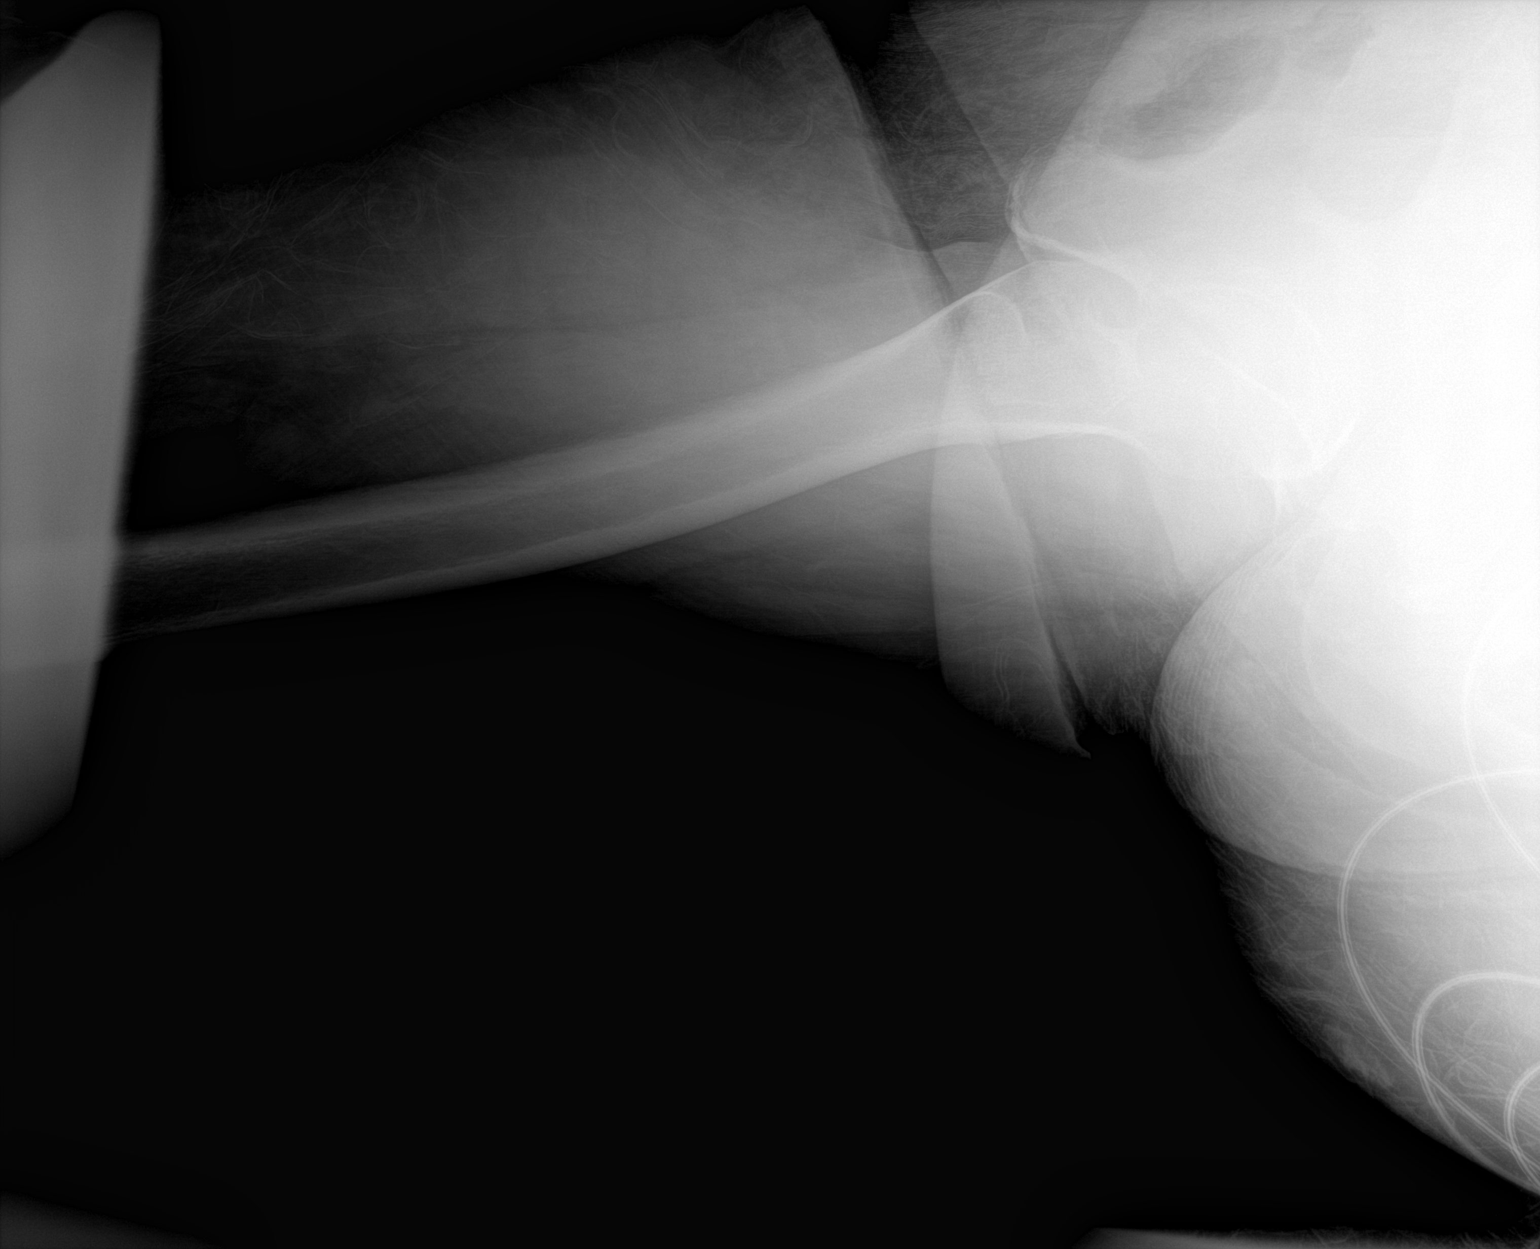

[hip ap]
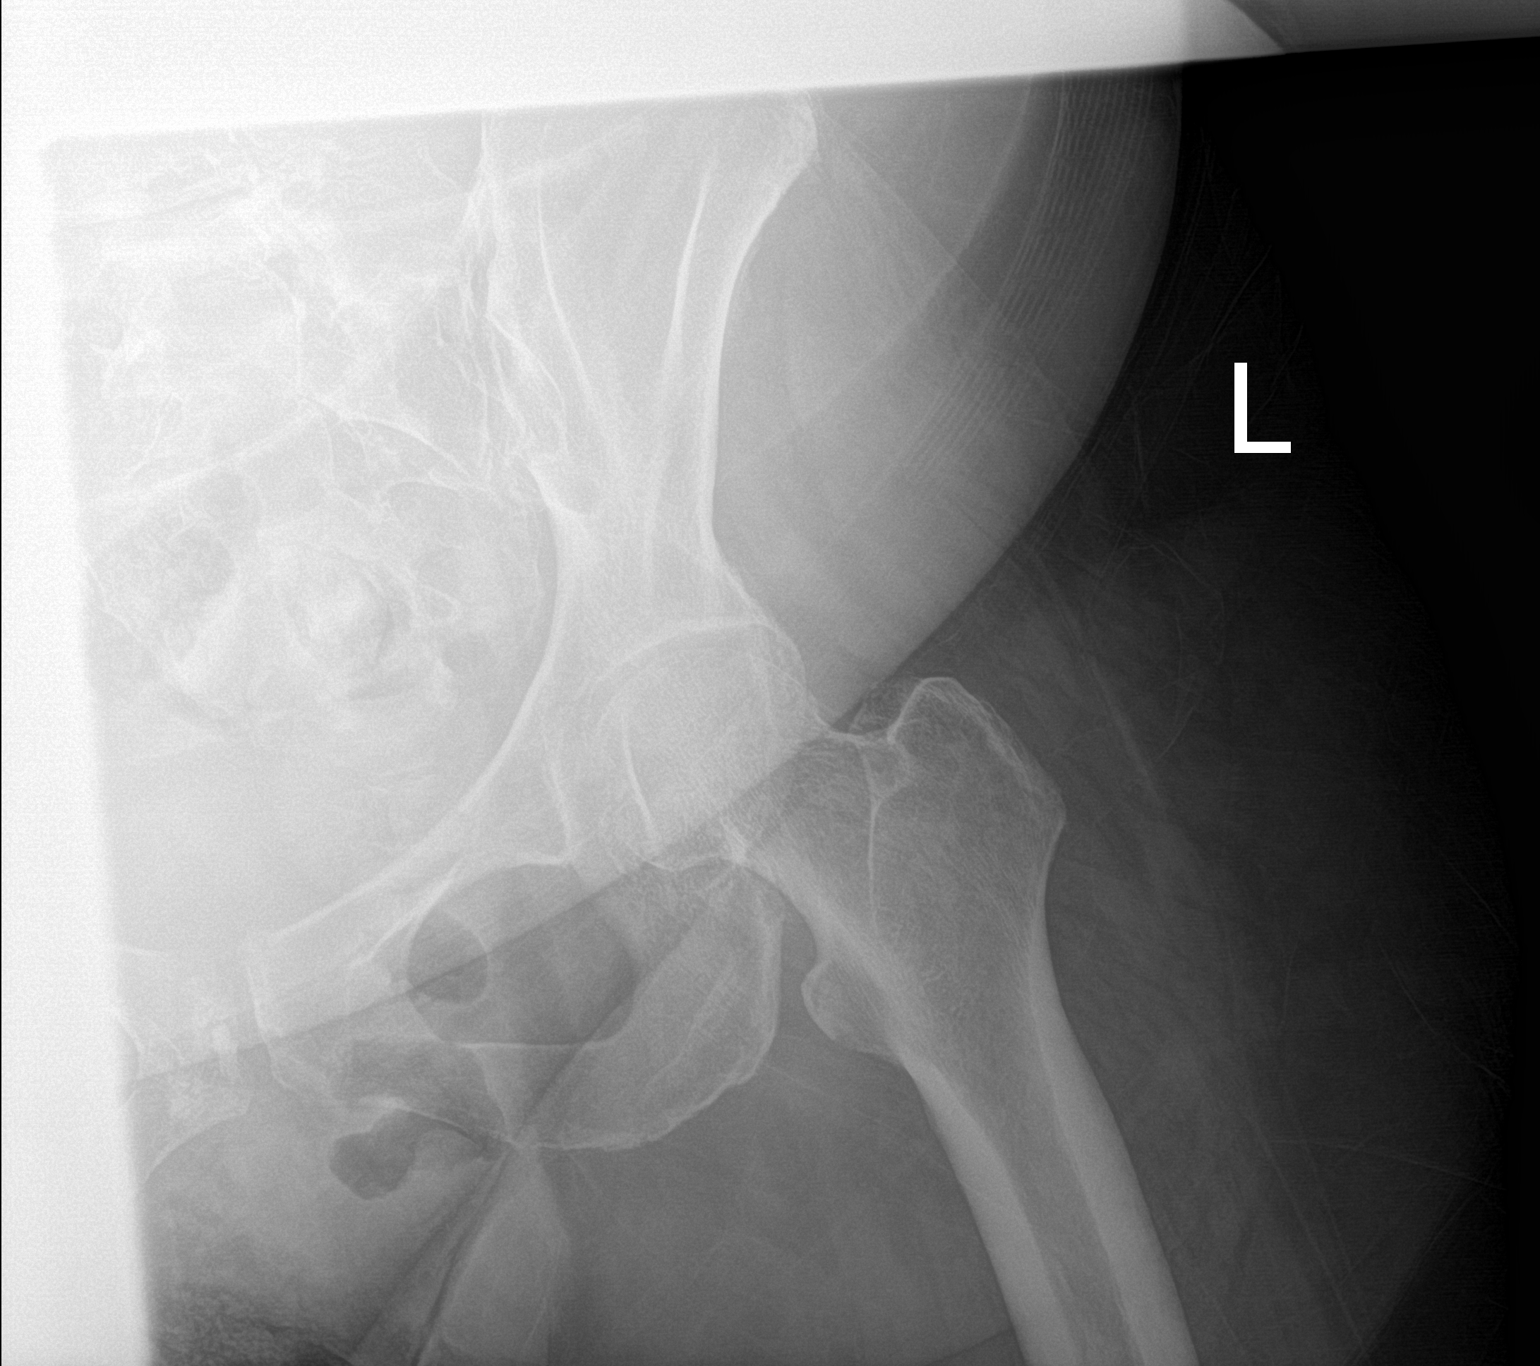

[3 of 3 positions shown; findings below may reference images not displayed]

FINDINGS: Pelvis radiograph is limited by overlapping EKG leads. There is no
evidence of hip or pelvic ring fracture. No hip dislocation.
IMPRESSION: Negative.

## 2021-01-28 DIAGNOSIS — J449 Chronic obstructive pulmonary disease, unspecified: Secondary | ICD-10-CM | POA: Diagnosis not present

## 2021-01-28 DIAGNOSIS — G4733 Obstructive sleep apnea (adult) (pediatric): Secondary | ICD-10-CM | POA: Diagnosis not present

## 2021-02-16 DIAGNOSIS — J449 Chronic obstructive pulmonary disease, unspecified: Secondary | ICD-10-CM | POA: Diagnosis not present

## 2021-03-11 ENCOUNTER — Ambulatory Visit: Payer: Medicare HMO | Admitting: Family

## 2021-03-13 ENCOUNTER — Ambulatory Visit: Payer: Medicare HMO | Admitting: Family

## 2021-03-13 ENCOUNTER — Telehealth: Payer: Self-pay | Admitting: Family

## 2021-03-13 NOTE — Telephone Encounter (Signed)
Patient did not show for her Heart Failure Clinic appointment on 03/13/21. Will attempt to reschedule.

## 2021-04-14 NOTE — Progress Notes (Signed)
`  Patient ID: Brenda Wu, female    DOB: Mar 03, 1955, 66 y.o.   MRN: [id number]  HPI  Ms Macknight is a 66 y/o female with a history of DM, hyperlipidemia, , stroke, depression, HTN, seizures, COPD, previous tobacco use and chronic heart failure.   Echo report from 03/18/20 reviewed and showed an EF of 50-55% along with mild LVH and mild MR. Echo report from 01/06/18 reviewed and showed an EF of 40% along with mild MR. Echo report from 11/05/17 reviewed and showed an EF of 45-50% along with mild/moderate MR/ TR and normal PA pressure.   Catheterization done 01/09/18 showed normal coronary arteries and an EF of 45-50%.  Was in the ED 12/04/20 due to shortness of breath. Found to be covid + and left AMA.   She presents today for a follow-up visit with a chief complaint of minimal shortness of breath with moderate exertion. She has associated right wrist pain along with this. She denies any difficulty sleeping, dizziness, abdominal distention, palpitations, pedal edema, chest pain, cough, fatigue or weight gain.   Overall feels great and continues to lose weight. She swims at the [facility] 5 days/ week along with doing some other exercises.   Past Medical History:  Diagnosis Date   Chronic combined systolic (congestive) and diastolic (congestive) heart failure (HCC)    a. 10/2017 Echo: EF 45-50%, diff HK. Mild to mod MR, mildly dil LA. Mod dil RV w/ mildly reduced RV fxn.   COPD (chronic obstructive pulmonary disease) (HCC)    Depression    Diabetes mellitus without complication (HCC)    Hypercholesteremia    Hypertension    Morbid obesity (HCC)    NICM (nonischemic cardiomyopathy) (HCC)    NSTEMI (non-ST elevated myocardial infarction) (HCC)    a. 12/2017 -->Cath: nl cors. EF 45-50%.   Seizures (HCC)    Sleep apnea    Stroke [facility])    Tricuspid regurgitation    a. 10/2017 Echo: Mild to mod TR.   Past Surgical History:  Procedure Laterality Date   ABDOMINAL HYSTERECTOMY     HARDWARE  REMOVAL Left 03/08/2019   Procedure: HARDWARE REMOVAL OF DISTAL FEMUR;  Surgeon: Kennedy Bucker, MD;  Location: [facility] ORS;  Service: Orthopedics;  Laterality: Left;   LEFT HEART CATH AND CORONARY ANGIOGRAPHY N/A 01/09/2018   Procedure: LEFT HEART CATH AND CORONARY ANGIOGRAPHY;  Surgeon: Iran Ouch, MD;  Location: [facility] INVASIVE CV LAB;  Service: Cardiovascular;  Laterality: N/A;   ORIF FEMUR FRACTURE Left 05/12/2018   Procedure: OPEN REDUCTION INTERNAL FIXATION (ORIF) DISTAL FEMUR FRACTURE;  Surgeon: Kennedy Bucker, MD;  Location: [facility] ORS;  Service: Orthopedics;  Laterality: Left;   REPLACEMENT TOTAL KNEE     Family History  Problem Relation Age of Onset   Hypertension Father    Asthma Sister    Heart murmur Sister    Diabetes Sister    Social History   Tobacco Use   Smoking status: Former    Packs/day: 1.00    Years: 30.00    Pack years: 30.00    Types: Cigarettes    Quit date: 02/22/2002    Years since quitting: 19.1   Smokeless tobacco: Never  Substance Use Topics   Alcohol use: No   Allergies  Allergen Reactions   Bismuth Subsalicylate Rash    Pepto Bismol   Prior to Admission medications   Medication Sig Start Date End Date Taking? Authorizing Provider  albuterol (PROVENTIL HFA;VENTOLIN HFA) 108 (90 Base) MCG/ACT inhaler Inhale 2  puffs into the lungs 4 (four) times daily as needed for wheezing or shortness of breath.  01/05/16  Yes [provider]  albuterol (PROVENTIL) (2.5 MG/3ML) 0.083% nebulizer solution Inhale 2.5 mg into the lungs every 6 (six) hours as needed for wheezing or shortness of breath.  10/01/16  Yes [provider]  aspirin EC 81 MG tablet Take 1 tablet (81 mg total) by mouth daily. 02/13/18  Yes [name], MD  atorvastatin (LIPITOR) 40 MG tablet Take 40 mg by mouth daily.   Yes [provider]  brimonidine (ALPHAGAN) 0.2 % ophthalmic solution Place 2 drops into both eyes daily. 12/31/19  Yes [provider]   diltiazem (TIAZAC) 180 MG 24 hr capsule Take 180 mg by mouth daily. 11/24/19  Yes [provider]  docusate sodium (COLACE) 100 MG capsule Take 1 capsule (100 mg total) by mouth 2 (two) times daily. Patient taking differently: Take 100 mg by mouth daily as needed. 05/16/18  Yes [name], MD  ergocalciferol (VITAMIN D2) 1.25 MG (50000 UT) capsule Take 50,000 Units by mouth once a week. 10/06/20  Yes [provider]  ferrous sulfate 324 MG TBEC Take 324 mg by mouth daily with breakfast.   Yes [provider]  Fluticasone-Umeclidin-Vilant (TRELEGY ELLIPTA) 100-62.5-25 MCG/INH AEPB Inhale 1 puff into the lungs 1 day or 1 dose.   Yes [provider]  glipiZIDE (GLUCOTROL) 10 MG tablet Take 10 mg by mouth 2 (two) times daily before a meal.    Yes [provider]  insulin glargine (LANTUS) 100 UNIT/ML injection Inject 15 Units into the skin at bedtime.   Yes [provider]  lamoTRIgine (LAMICTAL) 100 MG tablet 100 mg 2 (two) times daily. 11/24/19  Yes [provider]  losartan (COZAAR) 50 MG tablet Take 50 mg by mouth daily.   Yes [provider]  Multiple Vitamin (MULTI-VITAMINS) TABS Take 1 tablet by mouth daily.   Yes [provider]  niacin 500 MG tablet Take 500 mg by mouth daily.   Yes [provider]  potassium chloride SA (K-DUR,KLOR-CON) 20 MEQ tablet Take 1 tablet (20 mEq total) by mouth daily. 02/12/17  Yes [name], MD  sitaGLIPtin (JANUVIA) 100 MG tablet Take 100 mg by mouth daily.   Yes [provider]  topiramate (TOPAMAX) 200 MG tablet Take 200 mg by mouth 2 (two) times daily.   Yes [provider]  vitamin B-12 (CYANOCOBALAMIN) 500 MCG tablet Take 500 mcg by mouth daily.   Yes [provider]  vitamin C (ASCORBIC ACID) 500 MG tablet Take 500 mg by mouth daily.   Yes [provider]  zinc sulfate 220 (50 Zn) MG capsule Take 220 mg by mouth daily.    Yes [provider]  torsemide (DEMADEX) 20 MG tablet Take 2 tablets (40 mg total) by mouth daily. Note dose change 04/15/21   Delma Freeze, FNP   Review of Systems  Constitutional:  Negative for appetite change and fatigue.  HENT:  Negative for congestion, postnasal drip and sore throat.   Eyes: Negative.   Respiratory:  Positive for shortness of breath (only during evening). Negative for chest tightness.   Cardiovascular:  Negative for chest pain, palpitations and leg swelling.  Gastrointestinal:  Negative for abdominal distention and abdominal pain.  Endocrine: Negative.   Genitourinary: Negative.   Musculoskeletal:  Positive for arthralgias (right wrist in brace). Negative for back pain and neck pain.  Skin: Negative.  Allergic/Immunologic: Negative.   Neurological:  Negative for dizziness and light-headedness.  Hematological:  Negative for adenopathy. Does not bruise/bleed easily.  Psychiatric/Behavioral:  Negative for dysphoric mood and sleep disturbance (wearing CPAP; sleeping on multiple pillows chronically). The patient is not nervous/anxious.    Vitals:   04/15/21 0846  BP: (!) 115/58  Pulse: 69  Resp: 16  SpO2: 97%  Weight: 209 lb 3 oz (94.9 kg)  Height: 5\' 3"  (1.6 m)   Wt Readings from Last 3 Encounters:  04/15/21 209 lb 3 oz (94.9 kg)  09/11/20 214 lb 4 oz (97.2 kg)  04/15/20 219 lb (99.3 kg)   Lab Results  Component Value Date   CREATININE 1.40 (H) 03/15/2019   CREATININE 1.47 (H) 03/08/2019   CREATININE 1.38 (H) 03/06/2019   Physical Exam Vitals and nursing note reviewed.  Constitutional:      Appearance: Normal appearance.  HENT:     Head: Normocephalic and atraumatic.  Cardiovascular:     Rate and Rhythm: Normal rate and regular rhythm.  Pulmonary:     Effort: Pulmonary effort is normal. No respiratory distress.     Breath sounds: No wheezing or rales.  Abdominal:     General: There is no distension.     Palpations: Abdomen is soft.      Tenderness: There is no abdominal tenderness.  Musculoskeletal:        General: No tenderness.     Cervical back: Normal range of motion and neck supple.     Right lower leg: No edema.     Left lower leg: No edema.  Skin:    General: Skin is warm and dry.  Neurological:     General: No focal deficit present.     Mental Status: She is alert and oriented to person, place, and time.  Psychiatric:        Mood and Affect: Mood normal.        Behavior: Behavior normal.        Thought Content: Thought content normal.    Assessment & Plan:  1: Chronic heart failure with preserved ejection fraction with structural changes (LVH)- - NYHA class II - euvolemic today - weighing daily; reminded to call for an overnight weight gain of >2 pounds or a weekly weight gain of >5 pounds - weight down 5 pounds since last visit 7 months ago - not adding salt to her food. Reviewed the importance of closely following a 2000mg  sodium diet - saw cardiology 05/04/2019) 04/15/20 - swimming at the [facility] 5 days a week & doing weights during the week as well - BNP 01/06/18 was 816.0 - PharmD reconciled medications with the patient  2: HTN- - BP looks good (115/58) - saw PCP [facility]) 03/16/21 - BMP done 12/24/20 reviewed and showed sodium 141, potassium 3.9, creatinine 1.4 and GFR 46  3: DM-  - glucose this morning at home was 97 - A1c 12/24/20 was 7.3%   4: COPD- - wearing oxygen at 2L at bedtime - wearing cpap at night  - saw pulmonologist 13/2/22) 01/28/21   Patient did not bring her medications nor a list. Each medication was verbally reviewed with the patient and she was encouraged to bring the bottles to every visit to confirm accuracy of list.  Due to HF stability, will not make a return appointment for patient at this time. Advised that she continue close follow-up with her other providers but that she could return at anytime. She was comfortable with this plan.

## 2021-04-15 ENCOUNTER — Ambulatory Visit: Payer: Medicare Other | Attending: Family | Admitting: Family

## 2021-04-15 ENCOUNTER — Other Ambulatory Visit: Payer: Self-pay

## 2021-04-15 ENCOUNTER — Encounter: Payer: Self-pay | Admitting: Family

## 2021-04-15 ENCOUNTER — Encounter: Payer: Self-pay | Admitting: Pharmacist

## 2021-04-15 VITALS — BP 115/58 | HR 69 | Resp 16 | Ht 63.0 in | Wt 209.2 lb

## 2021-04-15 DIAGNOSIS — E1122 Type 2 diabetes mellitus with diabetic chronic kidney disease: Secondary | ICD-10-CM | POA: Diagnosis not present

## 2021-04-15 DIAGNOSIS — I5032 Chronic diastolic (congestive) heart failure: Secondary | ICD-10-CM | POA: Diagnosis not present

## 2021-04-15 DIAGNOSIS — J449 Chronic obstructive pulmonary disease, unspecified: Secondary | ICD-10-CM | POA: Insufficient documentation

## 2021-04-15 DIAGNOSIS — Z87891 Personal history of nicotine dependence: Secondary | ICD-10-CM | POA: Diagnosis not present

## 2021-04-15 DIAGNOSIS — I110 Hypertensive heart disease with heart failure: Secondary | ICD-10-CM | POA: Diagnosis not present

## 2021-04-15 DIAGNOSIS — E785 Hyperlipidemia, unspecified: Secondary | ICD-10-CM | POA: Insufficient documentation

## 2021-04-15 DIAGNOSIS — E119 Type 2 diabetes mellitus without complications: Secondary | ICD-10-CM | POA: Diagnosis not present

## 2021-04-15 DIAGNOSIS — Z9989 Dependence on other enabling machines and devices: Secondary | ICD-10-CM | POA: Insufficient documentation

## 2021-04-15 DIAGNOSIS — R569 Unspecified convulsions: Secondary | ICD-10-CM | POA: Insufficient documentation

## 2021-04-15 DIAGNOSIS — Z9981 Dependence on supplemental oxygen: Secondary | ICD-10-CM | POA: Diagnosis not present

## 2021-04-15 DIAGNOSIS — Z8673 Personal history of transient ischemic attack (TIA), and cerebral infarction without residual deficits: Secondary | ICD-10-CM | POA: Diagnosis not present

## 2021-04-15 DIAGNOSIS — M25531 Pain in right wrist: Secondary | ICD-10-CM | POA: Insufficient documentation

## 2021-04-15 DIAGNOSIS — I10 Essential (primary) hypertension: Secondary | ICD-10-CM

## 2021-04-15 DIAGNOSIS — F32A Depression, unspecified: Secondary | ICD-10-CM | POA: Diagnosis not present

## 2021-04-15 DIAGNOSIS — N1831 Chronic kidney disease, stage 3a: Secondary | ICD-10-CM

## 2021-04-15 DIAGNOSIS — Z8616 Personal history of COVID-19: Secondary | ICD-10-CM | POA: Insufficient documentation

## 2021-04-15 DIAGNOSIS — Z794 Long term (current) use of insulin: Secondary | ICD-10-CM

## 2021-04-15 MED ORDER — TORSEMIDE 20 MG PO TABS
40.0000 mg | ORAL_TABLET | Freq: Every day | ORAL | 3 refills | Status: AC
Start: 2021-04-15 — End: ?

## 2021-04-15 NOTE — Progress Notes (Signed)
[facility] - HEART FAILURE CLINIC - PHARMACIST COUNSELING NOTE  HFpEF  Guideline-Directed Medical Therapy/Evidence Based Medicine  ACE/ARB/ARNI: Losartan 50 mg daily Beta Blocker: None Aldosterone Antagonist: None Diuretic: Torsemide 40 mg daily SGLT2i: None  Adherence Assessment  Do you ever forget to take your medication? [] Yes [x] No  Do you ever skip doses due to side effects? [] Yes [x] No  Do you have trouble affording your medicines? [] Yes [x] No  Are you ever unable to pick up your medication due to transportation difficulties? [] Yes [x] No  Do you ever stop taking your medications because you don't believe they are helping? [] Yes [x] No  Do you check your weight daily? [x] Yes [] No   Adherence strategy: Pill box  Barriers to obtaining medications: None  Vital signs: HR 69, BP 115/58, weight (pounds) 209 lbs ECHO: Date 03/18/20, EF 50-55%, notes mild LVH and mild MR Cath: Date 01/09/18, EF 45-50%, notes mild MR  BMP Latest Ref Rng & Units 03/15/2019 03/08/2019 03/06/2019  Glucose 70 - 99 mg/dL ) ) )  BUN 8 - 23 mg/dL ) ) 23  Creatinine 0.44 - 1.00 mg/dL ) ) 03/20/20)  BUN/Creat Ratio 12 - 28 - - -  Sodium 135 - 145 mmol/L 145 138 141  Potassium 3.5 - 5.1 mmol/L 3.7 4.8 3.7  Chloride 98 - 111 mmol/L 103 100 101  CO2 22 - 32 mmol/L 33(H) 26 30  Calcium 8.9 - 10.3 mg/dL 01/11/18) 03/17/2019) 9.2    Past Medical History:  Diagnosis Date   Chronic combined systolic (congestive) and diastolic (congestive) heart failure (HCC)    a. 10/2017 Echo: EF 45-50%, diff HK. Mild to mod MR, mildly dil LA. Mod dil RV w/ mildly reduced RV fxn.   COPD (chronic obstructive pulmonary disease) (HCC)    Depression    Diabetes mellitus without complication (HCC)    Hypercholesteremia    Hypertension    Morbid obesity (HCC)    NICM (nonischemic cardiomyopathy) (HCC)    NSTEMI (non-ST elevated myocardial infarction) (HCC)    a. 12/2017  -->Cath: nl cors. EF 45-50%.   Seizures (HCC)    Sleep apnea    Stroke [facility])    Tricuspid regurgitation    a. 10/2017 Echo: Mild to mod TR.    ASSESSMENT 66 year old female who presents to the HF clinic for follow-up appointment. PMH significant for CHF, DM, HTN, HLD, COPD, stroke, and depression.   Recent ED Visit (past 6 months): Date - 12/04/20, CC - chest wall pain, found to be COVID positive and left AMA.   PLAN CHF/HTN  - HR and BP well controlled, appears euvolemic today - Continue losartan 50 mg BID, and torsemide 40 mg daily   DM - blood glucose was 97 at home this morning - A1c was 7.3% on 12/24/20 - Continue Lantus 15 units daily, Januvia 100 mg daily, and glipizide 10 mg BID - Consider addition of SGLT2i at a later visit   Time spent: 15 minutes  7.62(U, Pharm.D. Clinical Pharmacist 04/15/2021 11:56 AM    Current Outpatient Medications:    albuterol (PROVENTIL HFA;VENTOLIN HFA) 108 (90 Base) MCG/ACT inhaler, Inhale 2 puffs into the lungs 4 (four) times daily as needed for wheezing or shortness of breath. , Disp: , Rfl:    albuterol (PROVENTIL) (2.5 MG/3ML) 0.083% nebulizer solution, Inhale 2.5 mg into the lungs every 6 (six) hours as needed for wheezing or shortness of breath. , Disp: , Rfl:    aspirin EC 81 MG  tablet, Take 1 tablet (81 mg total) by mouth daily., Disp: 32 tablet, Rfl: 0   atorvastatin (LIPITOR) 40 MG tablet, Take 40 mg by mouth daily., Disp: , Rfl:    brimonidine (ALPHAGAN) 0.2 % ophthalmic solution, Place 2 drops into both eyes daily., Disp: , Rfl:    diltiazem (TIAZAC) 180 MG 24 hr capsule, Take 180 mg by mouth daily., Disp: , Rfl:    docusate sodium (COLACE) 100 MG capsule, Take 1 capsule (100 mg total) by mouth 2 (two) times daily. (Patient taking differently: Take 100 mg by mouth daily as needed.), Disp: 10 capsule, Rfl: 0   ergocalciferol (VITAMIN D2) 1.25 MG (50000 UT) capsule, Take 50,000 Units by mouth once a week., Disp: , Rfl:     ferrous sulfate 324 MG TBEC, Take 324 mg by mouth daily with breakfast., Disp: , Rfl:    Fluticasone-Umeclidin-Vilant (TRELEGY ELLIPTA) 100-62.5-25 MCG/INH AEPB, Inhale 1 puff into the lungs 1 day or 1 dose., Disp: , Rfl:    glipiZIDE (GLUCOTROL) 10 MG tablet, Take 10 mg by mouth 2 (two) times daily before a meal. , Disp: , Rfl:    insulin glargine (LANTUS) 100 UNIT/ML injection, Inject 15 Units into the skin at bedtime., Disp: , Rfl:    lamoTRIgine (LAMICTAL) 100 MG tablet, 100 mg 2 (two) times daily., Disp: , Rfl:    losartan (COZAAR) 50 MG tablet, Take 50 mg by mouth daily., Disp: , Rfl:    Multiple Vitamin (MULTI-VITAMINS) TABS, Take 1 tablet by mouth daily., Disp: , Rfl:    niacin 500 MG tablet, Take 500 mg by mouth daily., Disp: , Rfl:    potassium chloride SA (K-DUR,KLOR-CON) 20 MEQ tablet, Take 1 tablet (20 mEq total) by mouth daily., Disp: 30 tablet, Rfl: 2   sitaGLIPtin (JANUVIA) 100 MG tablet, Take 100 mg by mouth daily., Disp: , Rfl:    topiramate (TOPAMAX) 200 MG tablet, Take 200 mg by mouth 2 (two) times daily., Disp: , Rfl:    torsemide (DEMADEX) 20 MG tablet, Take 2 tablets (40 mg total) by mouth daily. Note dose change, Disp: 180 tablet, Rfl: 3   vitamin B-12 (CYANOCOBALAMIN) 500 MCG tablet, Take 500 mcg by mouth daily., Disp: , Rfl:    vitamin C (ASCORBIC ACID) 500 MG tablet, Take 500 mg by mouth daily., Disp: , Rfl:    zinc sulfate 220 (50 Zn) MG capsule, Take 220 mg by mouth daily., Disp: , Rfl:     MEDICATION ADHERENCES TIPS AND STRATEGIES Taking medication as prescribed improves patient outcomes in heart failure (reduces hospitalizations, improves symptoms, increases survival) Side effects of medications can be managed by decreasing doses, switching agents, stopping drugs, or adding additional therapy. Please let someone in the Heart Failure Clinic know if you have having bothersome side effects so we can modify your regimen. Do not alter your medication regimen without  talking to [place].  Medication reminders can help patients remember to take drugs on time. If you are missing or forgetting doses you can try linking behaviors, using pill boxes, or an electronic reminder like an alarm on your phone or an app. Some people can also get automated phone calls as medication reminders.

## 2021-04-15 NOTE — Patient Instructions (Addendum)
Continue weighing daily and call for an overnight weight gain of 3 pounds or more or a weekly weight gain of more than 5 pounds.  ° ° ° °Call us in the future if you need us for anything °

## 2021-07-03 ENCOUNTER — Other Ambulatory Visit: Payer: Self-pay | Admitting: Internal Medicine

## 2021-07-03 DIAGNOSIS — Z1231 Encounter for screening mammogram for malignant neoplasm of breast: Secondary | ICD-10-CM

## 2021-07-16 LAB — EXTERNAL GENERIC LAB PROCEDURE: COLOGUARD: NEGATIVE

## 2021-08-28 ENCOUNTER — Ambulatory Visit
Admission: EM | Admit: 2021-08-28 | Discharge: 2021-08-28 | Disposition: A | Discharge status: Home / Self Care | Payer: Medicare Other | Attending: Emergency Medicine | Admitting: Emergency Medicine

## 2021-08-28 ENCOUNTER — Encounter: Payer: Self-pay | Admitting: Emergency Medicine

## 2021-08-28 ENCOUNTER — Other Ambulatory Visit: Payer: Self-pay

## 2021-08-28 DIAGNOSIS — J441 Chronic obstructive pulmonary disease with (acute) exacerbation: Secondary | ICD-10-CM | POA: Diagnosis not present

## 2021-08-28 MED ORDER — METHYLPREDNISOLONE SODIUM SUCC 125 MG IJ SOLR
125.0000 mg | Freq: Once | INTRAMUSCULAR | Status: AC
  Administered 2021-08-28: 125 mg via INTRAMUSCULAR

## 2021-08-28 MED ORDER — BENZONATATE 100 MG PO CAPS: 100.0000 mg | ORAL_CAPSULE | Freq: Three times a day (TID) | ORAL | 0 refills | Status: AC

## 2021-08-28 MED ORDER — IPRATROPIUM-ALBUTEROL 0.5-2.5 (3) MG/3ML IN SOLN
3.0000 mL | Freq: Once | RESPIRATORY_TRACT | Status: AC
Start: 2021-08-28 — End: 2021-08-28
  Administered 2021-08-28: 3 mL via RESPIRATORY_TRACT

## 2021-08-28 MED ORDER — ALBUTEROL SULFATE HFA 108 (90 BASE) MCG/ACT IN AERS
2.0000 | INHALATION_SPRAY | Freq: Four times a day (QID) | RESPIRATORY_TRACT | 1 refills | Status: AC | PRN
Start: 2021-08-28 — End: ?

## 2021-08-28 MED ORDER — PREDNISONE 20 MG PO TABS: 40.0000 mg | ORAL_TABLET | Freq: Every day | ORAL | 0 refills | Status: DC

## 2021-08-28 NOTE — ED Triage Notes (Addendum)
Pt c/o shortness of breath, cough, wheezing. Started today. Pt states she usually uses 2l of o2 all the time, she left ox in the car. Pt has known COPD and took her last nebulizer treatment about 2:00 today.

## 2021-08-28 NOTE — Discharge Instructions (Signed)
Today you are being treated for inflammation to your upper airways  Begin use of prednisone every morning with food for 5 days to help reduce inflammation   May use albuterol inhaler taking 2 puffs every 4 hours as needed to help calm shortness of breath and wheezing  May use Tessalon pill every 8 hours as needed for coughing, may attempt use of over-the-counter Delsym in addition  For worsening signs of breathing please go to the nearest emergency department for evaluation  In addition:  Maintaining adequate hydration may help to thin secretions and soothe the respiratory mucosa   Warm Liquids- Ingestion of warm liquids may have a soothing effect on the respiratory mucosa, increase the flow of nasal mucus, and loosen respiratory secretions, making them easier to remove

## 2021-08-28 NOTE — ED Provider Notes (Signed)
[facility]    CSN: [id number] Arrival date & time: 08/28/21  1819      History   Chief Complaint Chief Complaint  Patient presents with   Shortness of Breath   Cough    HPI Brenda Wu is a 66 y.o. female.   Patient presents with a productive cough, shortness of breath at rest and wheezing beginning today.  No known triggers.  Attempted use of a nebulizer treatment at home which was ineffective and minimizing symptoms.  Wears 2 L of O2 at baseline, currently wearing oxygen.  Denies fever, chills, body aches, URI symptoms.  History of CHF, COPD, tricuspid regurgitation, diabetes, hypertension, seizures, CVA.  Former smoker.  Past Medical History:  Diagnosis Date   Chronic combined systolic (congestive) and diastolic (congestive) heart failure (HCC)    a. 10/2017 Echo: EF 45-50%, diff HK. Mild to mod MR, mildly dil LA. Mod dil RV w/ mildly reduced RV fxn.   COPD (chronic obstructive pulmonary disease) (HCC)    Depression    Diabetes mellitus without complication (HCC)    Hypercholesteremia    Hypertension    Morbid obesity (HCC)    NICM (nonischemic cardiomyopathy) (HCC)    NSTEMI (non-ST elevated myocardial infarction) (HCC)    a. 12/2017 -->Cath: nl cors. EF 45-50%.   Seizures (HCC)    Sleep apnea    Stroke [facility])    Tricuspid regurgitation    a. 10/2017 Echo: Mild to mod TR.    Patient Active Problem List   Diagnosis Date Noted   Chronic left shoulder pain 07/26/2019   Left rotator cuff tear arthropathy 07/26/2019   Arthritis of left shoulder region 07/26/2019   Fracture of prosthetic knee (HCC) 05/12/2018   NICM (nonischemic cardiomyopathy) (HCC) 02/13/2018   Non-ST elevation (NSTEMI) myocardial infarction [facility])    Chronic combined systolic and diastolic heart failure (HCC) 12/12/2017   HTN (hypertension) 12/12/2017   Diabetes (HCC) 12/12/2017   COPD exacerbation (HCC) 02/07/2017   History of cerebellar stroke 03/12/2016   History of seizures  03/12/2016   Pulmonary nodule, left 01/05/2016   Paroxysmal atrial fibrillation (HCC) 10/17/2015   Cerebellar stroke (HCC) 06/26/2015   Transient diplopia 06/09/2015   Asthma-chronic obstructive pulmonary disease overlap syndrome (HCC) 05/30/2015   COPD with asthma (HCC) 05/30/2015   Hypoxemia 05/30/2015   Obstructive sleep apnea syndrome 05/30/2015   OSA on CPAP 05/30/2015   Obesity (BMI 30-39.9) 03/12/2015   Bilateral hearing loss 01/22/2015   Generalized seizure disorder (HCC) 01/22/2015   Hyperlipidemia associated with type 2 diabetes mellitus (HCC) 01/22/2015   Hypertension associated with diabetes (HCC) 01/22/2015   Morbid obesity due to excess calories (HCC) 01/22/2015   Primary osteoarthritis involving multiple joints 01/22/2015   Severe episode of recurrent major depressive disorder, without psychotic features (HCC) 01/22/2015    Past Surgical History:  Procedure Laterality Date   ABDOMINAL HYSTERECTOMY     HARDWARE REMOVAL Left 03/08/2019   Procedure: HARDWARE REMOVAL OF DISTAL FEMUR;  Surgeon: Kennedy Bucker, MD;  Location: [facility] ORS;  Service: Orthopedics;  Laterality: Left;   LEFT HEART CATH AND CORONARY ANGIOGRAPHY N/A 01/09/2018   Procedure: LEFT HEART CATH AND CORONARY ANGIOGRAPHY;  Surgeon: Iran Ouch, MD;  Location: [facility] INVASIVE CV LAB;  Service: Cardiovascular;  Laterality: N/A;   ORIF FEMUR FRACTURE Left 05/12/2018   Procedure: OPEN REDUCTION INTERNAL FIXATION (ORIF) DISTAL FEMUR FRACTURE;  Surgeon: Kennedy Bucker, MD;  Location: [facility] ORS;  Service: Orthopedics;  Laterality: Left;   REPLACEMENT TOTAL KNEE  OB History   No obstetric history on file.      Home Medications    Prior to Admission medications   Medication Sig Start Date End Date Taking? Authorizing Provider  albuterol (PROVENTIL HFA;VENTOLIN HFA) 108 (90 Base) MCG/ACT inhaler Inhale 2 puffs into the lungs 4 (four) times daily as needed for wheezing or shortness of breath.  01/05/16  Yes  [provider]  albuterol (PROVENTIL) (2.5 MG/3ML) 0.083% nebulizer solution Inhale 2.5 mg into the lungs every 6 (six) hours as needed for wheezing or shortness of breath.  10/01/16  Yes [provider]  aspirin EC 81 MG tablet Take 1 tablet (81 mg total) by mouth daily. 02/13/18  Yes [name], MD  atorvastatin (LIPITOR) 40 MG tablet Take 40 mg by mouth daily.   Yes [provider]  brimonidine (ALPHAGAN) 0.2 % ophthalmic solution Place 2 drops into both eyes daily. 12/31/19  Yes [provider]  diltiazem (TIAZAC) 180 MG 24 hr capsule Take 180 mg by mouth daily. 11/24/19  Yes [provider]  ergocalciferol (VITAMIN D2) 1.25 MG (50000 UT) capsule Take 50,000 Units by mouth once a week. 10/06/20  Yes [provider]  ferrous sulfate 324 MG TBEC Take 324 mg by mouth daily with breakfast.   Yes [provider]  Fluticasone-Umeclidin-Vilant (TRELEGY ELLIPTA) 100-62.5-25 MCG/INH AEPB Inhale 1 puff into the lungs 1 day or 1 dose.   Yes [provider]  glipiZIDE (GLUCOTROL) 10 MG tablet Take 10 mg by mouth 2 (two) times daily before a meal.    Yes [provider]  insulin glargine (LANTUS) 100 UNIT/ML injection Inject 15 Units into the skin at bedtime.   Yes [provider]  lamoTRIgine (LAMICTAL) 100 MG tablet 100 mg 2 (two) times daily. 11/24/19  Yes [provider]  Multiple Vitamin (MULTI-VITAMINS) TABS Take 1 tablet by mouth daily.   Yes [provider]  niacin 500 MG tablet Take 500 mg by mouth daily.   Yes [provider]  potassium chloride SA (K-DUR,KLOR-CON) 20 MEQ tablet Take 1 tablet (20 mEq total) by mouth daily. 02/12/17  Yes [name], MD  sitaGLIPtin (JANUVIA) 100 MG tablet Take 100 mg by mouth daily.   Yes [provider]  topiramate (TOPAMAX) 200 MG tablet Take 200 mg by mouth 2 (two) times daily.   Yes [provider]  torsemide (DEMADEX)  20 MG tablet Take 2 tablets (40 mg total) by mouth daily. Note dose change 04/15/21  Yes Hackney, Tina A, FNP  vitamin B-12 (CYANOCOBALAMIN) 500 MCG tablet Take 500 mcg by mouth daily.   Yes [provider]  vitamin C (ASCORBIC ACID) 500 MG tablet Take 500 mg by mouth daily.   Yes [provider]  zinc sulfate 220 (50 Zn) MG capsule Take 220 mg by mouth daily.   Yes [provider]  docusate sodium (COLACE) 100 MG capsule Take 1 capsule (100 mg total) by mouth 2 (two) times daily. Patient taking differently: Take 100 mg by mouth daily as needed. 05/16/18   [name], MD  losartan (COZAAR) 50 MG tablet Take 50 mg by mouth daily.    [provider]    Family History Family History  Problem Relation Age of Onset   Hypertension Father    Asthma Sister    Heart murmur Sister    Diabetes Sister     Social History Social History   Tobacco Use   Smoking status: Former  Packs/day: 1.00    Years: 30.00    Total pack years: 30.00    Types: Cigarettes    Quit date: 02/22/2002    Years since quitting: 19.5   Smokeless tobacco: Never  Vaping Use   Vaping Use: Never used  Substance Use Topics   Alcohol use: No   Drug use: No     Allergies   Bismuth subsalicylate   Review of Systems Review of Systems  Constitutional: Negative.   HENT: Negative.    Respiratory:  Positive for cough, shortness of breath and wheezing. Negative for apnea, choking, chest tightness and stridor.   Cardiovascular: Negative.      Physical Exam Triage Vital Signs ED Triage Vitals  Enc Vitals Group     BP 08/28/21 1832 126/67     Pulse Rate 08/28/21 1832 73     Resp --      Temp 08/28/21 1832 98.4 F (36.9 C)     Temp Source 08/28/21 1832 Oral     SpO2 08/28/21 1832 92 %     Weight 08/28/21 1829 209 lb 3.5 oz (94.9 kg)     Height 08/28/21 1829 5\' 3"  (1.6 m)     Head Circumference --      Peak Flow --      Pain Score 08/28/21 1828 0     Pain Loc --       Pain Edu? --      Excl. in GC? --    No data found.  Updated Vital Signs BP 126/67 (BP Location: Left Arm)   Pulse 73   Temp 98.4 F (36.9 C) (Oral)   Ht 5\' 3"  (1.6 m)   Wt 209 lb 3.5 oz (94.9 kg)   SpO2 97%   BMI 37.06 kg/m   Visual Acuity Right Eye Distance:   Left Eye Distance:   Bilateral Distance:    Right Eye Near:   Left Eye Near:    Bilateral Near:     Physical Exam Constitutional:      Appearance: Normal appearance.  HENT:     Head: Normocephalic.  Eyes:     Extraocular Movements: Extraocular movements intact.  Pulmonary:     Effort: Accessory muscle usage present.     Breath sounds: Wheezing present.     Comments: Difficulty speaking between breaths  Neurological:     Mental Status: She is alert and oriented to person, place, and time.  Psychiatric:        Mood and Affect: Mood normal.        Behavior: Behavior normal.      UC Treatments / Results  Labs (all labs ordered are listed, but only abnormal results are displayed) Labs Reviewed - No data to display  EKG   Radiology No results found.  Procedures Procedures (including critical care time)  Medications Ordered in UC Medications  ipratropium-albuterol (DUONEB) 0.5-2.5 (3) MG/3ML nebulizer solution 3 mL (has no administration in time range)  methylPREDNISolone sodium succinate (SOLU-MEDROL) 125 mg/2 mL injection 125 mg (has no administration in time range)    Initial Impression / Assessment and Plan / UC Course  I have reviewed the triage vital signs and the nursing notes.  Pertinent labs & imaging results that were available during my care of the patient were reviewed by me and considered in my medical decision making (see chart for details).  COPD exacerbation  Patient is currently in acute distress, O2 saturation is 97% on room air, wheezing is heard to  auscultation, patient is having difficulty speaking, breathing is labored and she is visibly uncomfortable, DuoNeb and  methylprednisolone injection given in office, on reevaluation patient endorses that she is feeling better, she is able to speak without interruptions for deep breaths, her breathing is no longer labored, mild wheezing is still noted to the lower lobes but clear to the upper, prescribed prednisone 40 mg burst, refill albuterol inhaler and states prescribed Tessalon for outpatient management, may follow-up with this urgent care as needed Final Clinical Impressions(s) / UC Diagnoses   Final diagnoses:  None   Discharge Instructions   None    ED Prescriptions   None    PDMP not reviewed this encounter.   Valinda Hoar, [place] 08/28/21 1912

## 2021-09-04 ENCOUNTER — Telehealth: Payer: Self-pay | Admitting: Cardiovascular Disease

## 2021-09-04 NOTE — Telephone Encounter (Signed)
3 attempts to schedule fu appt from recall list.   Deleting recall.   

## 2021-10-06 ENCOUNTER — Other Ambulatory Visit: Payer: Self-pay | Admitting: Internal Medicine

## 2021-10-06 DIAGNOSIS — S22000A Wedge compression fracture of unspecified thoracic vertebra, initial encounter for closed fracture: Secondary | ICD-10-CM

## 2021-10-14 ENCOUNTER — Ambulatory Visit: Admission: RE | Admit: 2021-10-14 | Payer: Medicare Other | Source: Ambulatory Visit

## 2021-10-21 ENCOUNTER — Ambulatory Visit
Admission: RE | Admit: 2021-10-21 | Discharge: 2021-10-21 | Disposition: A | Discharge status: Home / Self Care | Payer: Medicare Other | Source: Ambulatory Visit | Attending: Internal Medicine | Admitting: Internal Medicine

## 2021-10-21 DIAGNOSIS — S22000A Wedge compression fracture of unspecified thoracic vertebra, initial encounter for closed fracture: Secondary | ICD-10-CM | POA: Diagnosis present

## 2022-01-21 ENCOUNTER — Ambulatory Visit
Admission: RE | Admit: 2022-01-21 | Discharge: 2022-01-21 | Disposition: A | Discharge status: Home / Self Care | Payer: Medicare Other | Source: Ambulatory Visit | Attending: Internal Medicine | Admitting: Internal Medicine

## 2022-01-21 DIAGNOSIS — Z1231 Encounter for screening mammogram for malignant neoplasm of breast: Secondary | ICD-10-CM | POA: Insufficient documentation

## 2022-04-16 DIAGNOSIS — G40909 Epilepsy, unspecified, not intractable, without status epilepticus: Secondary | ICD-10-CM | POA: Diagnosis not present

## 2022-04-16 DIAGNOSIS — Z794 Long term (current) use of insulin: Secondary | ICD-10-CM | POA: Diagnosis not present

## 2022-04-16 DIAGNOSIS — M549 Dorsalgia, unspecified: Secondary | ICD-10-CM | POA: Diagnosis not present

## 2022-04-16 DIAGNOSIS — J449 Chronic obstructive pulmonary disease, unspecified: Secondary | ICD-10-CM | POA: Diagnosis not present

## 2022-04-16 DIAGNOSIS — I480 Paroxysmal atrial fibrillation: Secondary | ICD-10-CM | POA: Diagnosis not present

## 2022-04-16 DIAGNOSIS — J9611 Chronic respiratory failure with hypoxia: Secondary | ICD-10-CM | POA: Diagnosis not present

## 2022-04-16 DIAGNOSIS — E119 Type 2 diabetes mellitus without complications: Secondary | ICD-10-CM | POA: Diagnosis not present

## 2022-05-24 DIAGNOSIS — H401131 Primary open-angle glaucoma, bilateral, mild stage: Secondary | ICD-10-CM | POA: Diagnosis not present

## 2022-05-24 DIAGNOSIS — H2513 Age-related nuclear cataract, bilateral: Secondary | ICD-10-CM | POA: Diagnosis not present

## 2022-06-01 DIAGNOSIS — J9611 Chronic respiratory failure with hypoxia: Secondary | ICD-10-CM | POA: Diagnosis not present

## 2022-06-23 DIAGNOSIS — H2513 Age-related nuclear cataract, bilateral: Secondary | ICD-10-CM | POA: Diagnosis not present

## 2022-06-23 DIAGNOSIS — H401131 Primary open-angle glaucoma, bilateral, mild stage: Secondary | ICD-10-CM | POA: Diagnosis not present

## 2022-07-07 DIAGNOSIS — H2513 Age-related nuclear cataract, bilateral: Secondary | ICD-10-CM | POA: Diagnosis not present

## 2022-07-09 ENCOUNTER — Emergency Department
Admission: EM | Admit: 2022-07-09 | Discharge: 2022-07-09 | Disposition: A | Discharge status: Home / Self Care | Payer: Medicare HMO | Attending: Emergency Medicine | Admitting: Emergency Medicine

## 2022-07-09 ENCOUNTER — Emergency Department: Payer: Medicare HMO

## 2022-07-09 ENCOUNTER — Other Ambulatory Visit: Payer: Self-pay

## 2022-07-09 DIAGNOSIS — X500XXA Overexertion from strenuous movement or load, initial encounter: Secondary | ICD-10-CM | POA: Diagnosis not present

## 2022-07-09 DIAGNOSIS — J449 Chronic obstructive pulmonary disease, unspecified: Secondary | ICD-10-CM | POA: Diagnosis not present

## 2022-07-09 DIAGNOSIS — E119 Type 2 diabetes mellitus without complications: Secondary | ICD-10-CM | POA: Insufficient documentation

## 2022-07-09 DIAGNOSIS — R062 Wheezing: Secondary | ICD-10-CM | POA: Diagnosis not present

## 2022-07-09 DIAGNOSIS — I10 Essential (primary) hypertension: Secondary | ICD-10-CM | POA: Diagnosis not present

## 2022-07-09 DIAGNOSIS — M19042 Primary osteoarthritis, left hand: Secondary | ICD-10-CM | POA: Diagnosis not present

## 2022-07-09 DIAGNOSIS — S60222A Contusion of left hand, initial encounter: Secondary | ICD-10-CM | POA: Insufficient documentation

## 2022-07-09 DIAGNOSIS — S6992XA Unspecified injury of left wrist, hand and finger(s), initial encounter: Secondary | ICD-10-CM | POA: Diagnosis present

## 2022-07-09 DIAGNOSIS — Y9389 Activity, other specified: Secondary | ICD-10-CM | POA: Diagnosis not present

## 2022-07-09 DIAGNOSIS — R0602 Shortness of breath: Secondary | ICD-10-CM | POA: Diagnosis not present

## 2022-07-09 LAB — CBC
HCT: 48.8 % — ABNORMAL HIGH (ref 36.0–46.0)
Hemoglobin: 15.2 g/dL — ABNORMAL HIGH (ref 12.0–15.0)
MCH: 29.5 pg (ref 26.0–34.0)
MCHC: 31.1 g/dL (ref 30.0–36.0)
MCV: 94.6 fL (ref 80.0–100.0)
Platelets: 177 10*3/uL (ref 150–400)
RBC: 5.16 MIL/uL — ABNORMAL HIGH (ref 3.87–5.11)
RDW: 14.1 % (ref 11.5–15.5)
WBC: 8.8 10*3/uL (ref 4.0–10.5)
nRBC: 0 % (ref 0.0–0.2)

## 2022-07-09 LAB — BASIC METABOLIC PANEL
Anion gap: 9 (ref 5–15)
BUN: 18 mg/dL (ref 8–23)
CO2: 27 mmol/L (ref 22–32)
Calcium: 8.9 mg/dL (ref 8.9–10.3)
Chloride: 105 mmol/L (ref 98–111)
Creatinine, Ser: 1.52 mg/dL — ABNORMAL HIGH (ref 0.44–1.00)
GFR, Estimated: 38 mL/min — ABNORMAL LOW (ref >60)
Glucose, Bld: 91 mg/dL (ref 70–99)
Potassium: 3.4 mmol/L — ABNORMAL LOW (ref 3.5–5.1)
Sodium: 141 mmol/L (ref 135–145)

## 2022-07-09 LAB — TROPONIN I (HIGH SENSITIVITY): Troponin I (High Sensitivity): 2 ng/L (ref <18)

## 2022-07-09 MED ORDER — PREDNISONE 20 MG PO TABS: 40.0000 mg | ORAL_TABLET | Freq: Every day | ORAL | 0 refills | Status: AC

## 2022-07-09 MED ORDER — DICLOFENAC SODIUM 1 % EX GEL: 4.0000 g | Freq: Four times a day (QID) | CUTANEOUS | 0 refills | Status: AC

## 2022-07-09 NOTE — ED Provider Notes (Signed)
[facility] Provider Note  Patient Contact: 7:21 PM (approximate)   History   Hand Injury   HPI  Brenda Wu is a 67 y.o. female with a history of diabetes, hypertension, hypercholesterolemia, COPD, presents to the emergency department with multiple medical complaints.  Patient is primarily complaining of some swelling along the dorsal aspect of her left hand.  Patient thinks that her hand was hyperextended while playing with her grandson several days ago.  Patient has also noticed some increased wheezing at home.  She is requesting a prescription for prednisone and would like to take 40 mg once daily for the next 5 days as this has worked well for her in the past.  No cough productive for purulent sputum production or fever.  Patient reports that she normally wears 2 L but does not have a mobile unit for her oxygen and states that she did become hypoxic in triage because she did not have her usual oxygen.  She denies current chest tightness or chest pain.  She states that she has prescriptions for breathing treatments at home.      Physical Exam   Triage Vital Signs: ED Triage Vitals  Enc Vitals Group     BP 07/09/22 1656 135/78     Pulse Rate 07/09/22 1656 60     Resp 07/09/22 1656 (!) 23     Temp 07/09/22 1656 98.2 F (36.8 C)     Temp Source 07/09/22 1656 Oral     SpO2 07/09/22 1656 (!) 80 %     Weight --      Height --      Head Circumference --      Peak Flow --      Pain Score 07/09/22 1657 4     Pain Loc --      Pain Edu? --      Excl. in GC? --     Most recent vital signs: Vitals:   07/09/22 1656 07/09/22 1659  BP: 135/78   Pulse: 60   Resp: (!) 23   Temp: 98.2 F (36.8 C)   SpO2: (!) 80% 94%     General: Alert and in no acute distress. Eyes:  PERRL. EOMI. Head: No acute traumatic findings ENT:      Nose: No congestion/rhinnorhea.      Mouth/Throat: Mucous membranes are moist. Neck: No stridor. No cervical spine  tenderness to palpation. Cardiovascular:  Good peripheral perfusion Respiratory: Normal respiratory effort without tachypnea or retractions.  Patient has diffuse wheezing auscultated bilaterally.  Good air entry to the bases with no decreased or absent breath sounds. Gastrointestinal: Bowel sounds 4 quadrants. Soft and nontender to palpation. No guarding or rigidity. No palpable masses. No distention. No CVA tenderness. Musculoskeletal: Full range of motion to all extremities.  Patient has swelling along the dorsal aspect of the left hand.  Palpable radial and ulnar pulses bilaterally and symmetrically. Neurologic:  No gross focal neurologic deficits are appreciated.  Skin:   No rash noted Other:   ED Results / Procedures / Treatments   Labs (all labs ordered are listed, but only abnormal results are displayed) Labs Reviewed  BASIC METABOLIC PANEL - Abnormal; Notable for the following components:      Result Value   Potassium 3.4 (*)    Creatinine, Ser 1.52 (*)    GFR, Estimated 38 (*)    All other components within normal limits  CBC - Abnormal; Notable for the following components:   RBC  5.16 (*)    Hemoglobin 15.2 (*)    HCT 48.8 (*)    All other components within normal limits  TROPONIN I (HIGH SENSITIVITY)  TROPONIN I (HIGH SENSITIVITY)       RADIOLOGY  I personally viewed and evaluated these images as part of my medical decision making, as well as reviewing the written report by the radiologist.  ED Provider Interpretation: No acute abnormality on chest x-ray or x-ray of the left hand.   PROCEDURES:  Critical Care performed: No  Procedures   MEDICATIONS ORDERED IN ED: Medications - No data to display   IMPRESSION / MDM / ASSESSMENT AND PLAN / ED COURSE  I reviewed the triage vital signs and the nursing notes.                              Assessment and plan Hand contusion Cough Wheezing 67 year old female presents to the emergency department with  some edema along the dorsal aspect of the left hand and wheezing.  See HPI for more details.  Patient's x-ray of her left hand was unremarkable.  I did recommend Voltaren cream once daily and place patient in a wrist splint that can be taken on and off.  Patient's chest x-ray showed no acute abnormality.  Patient was prescribed prednisone as requested.  CBG 91.  Return precautions were given to return with new or worsening symptoms.  All patient questions were answered.      FINAL CLINICAL IMPRESSION(S) / ED DIAGNOSES   Final diagnoses:  Contusion of left hand, initial encounter  Wheezing     Rx / DC Orders   ED Discharge Orders          Ordered    predniSONE (DELTASONE) 20 MG tablet  Daily        07/09/22 1918    diclofenac Sodium (VOLTAREN) 1 % GEL  4 times daily        07/09/22 1918             Note:  This document was prepared using Dragon voice recognition software and may include unintentional dictation errors.   Pia Mau [place], PA-C 07/09/22 1924    Sharman Cheek, MD 07/09/22 [phone]

## 2022-07-09 NOTE — Discharge Instructions (Addendum)
Take 40 mg of prednisone once daily for the next 5 days. You can apply topical Voltaren to left hand once a day.

## 2022-07-09 NOTE — ED Triage Notes (Addendum)
Pt presents to ED with c/o of having a injury to L hand while playing with kids L hand swelling noted.   During triage pt's 02 sat was 80% on RA, pt states HX of CPOD. Pt does appear to have increased work of breathing and states albuterol inhaler is not working per pt at this time.   Pt states she wears 2L/min via [place] while at home but did not bring it.   Pt placed on 2L/min via [place] and pt is now 95%.

## 2022-07-13 ENCOUNTER — Telehealth: Payer: Self-pay | Admitting: *Deleted

## 2022-07-13 NOTE — Telephone Encounter (Signed)
Transition Care Management Unsuccessful Follow-up Telephone Call  Date of discharge and from where:  [facility] 5/17  Attempts:  1st Attempt  Reason for unsuccessful TCM follow-up call:  Left voice message

## 2022-07-14 DIAGNOSIS — M79642 Pain in left hand: Secondary | ICD-10-CM | POA: Diagnosis not present

## 2022-07-14 DIAGNOSIS — J9611 Chronic respiratory failure with hypoxia: Secondary | ICD-10-CM | POA: Diagnosis not present

## 2022-07-15 ENCOUNTER — Encounter: Payer: Self-pay | Admitting: Ophthalmology

## 2022-07-15 ENCOUNTER — Telehealth: Payer: Self-pay | Admitting: *Deleted

## 2022-07-15 NOTE — Anesthesia Preprocedure Evaluation (Addendum)
Anesthesia Evaluation  Patient identified by MRN, date of birth, ID band Patient awake    Reviewed: Allergy & Precautions, H&P , NPO status , Patient's Chart, lab work & pertinent test results  Airway Mallampati: III  TM Distance: >3 FB Neck ROM: Full    Dental no notable dental hx. (+) Upper Dentures   Pulmonary asthma , sleep apnea , COPD, former smoker   Pulmonary exam normal breath sounds clear to auscultation       Cardiovascular hypertension, + Past MI and +CHF  Normal cardiovascular exam Rhythm:Regular Rate:Normal  Echo 03-18-20 EF 50-55%, mild LVH, grade I diastolic dysfunction, LA and RA mild dilation, mild MR   Neuro/Psych Seizures -,    Depression    CVA  negative psych ROS   GI/Hepatic negative GI ROS, Neg liver ROS,,,  Endo/Other  diabetes    Renal/GU negative Renal ROS  negative genitourinary   Musculoskeletal  (+) Arthritis ,    Abdominal   Peds negative pediatric ROS (+)  Hematology negative hematology ROS (+)   Anesthesia Other Findings Diabetes mellitus without complication Hypertension Hypercholesteremia  Seizures (HCC) COPD (chronic obstructive pulmonary disease) (HCC)  Stroke (HCC) Depression  Chronic combined systolic (congestive) and diastolic (congestive) heart failure (HCC) NICM (nonischemic cardiomyopathy) (HCC)  NSTEMI (non-ST elevated myocardial infarction) (HCC) Tricuspid regurgitation  Morbid obesity (HCC) Sleep apnea  Wears denture glaucoma   Reproductive/Obstetrics negative OB ROS                             Anesthesia Physical Anesthesia Plan  ASA: 3  Anesthesia Plan: MAC   Post-op Pain Management:    Induction: Intravenous  PONV Risk Score and Plan:   Airway Management Planned: Natural Airway and Nasal Cannula  Additional Equipment:   Intra-op Plan:   Post-operative Plan:   Informed Consent: I have reviewed the patients History  and Physical, chart, labs and discussed the procedure including the risks, benefits and alternatives for the proposed anesthesia with the patient or authorized representative who has indicated his/her understanding and acceptance.     Dental Advisory Given  Plan Discussed with: Anesthesiologist, CRNA and Surgeon  Anesthesia Plan Comments: (Patient consented for risks of anesthesia including but not limited to:  - adverse reactions to medications - damage to eyes, teeth, lips or other oral mucosa - nerve damage due to positioning  - sore throat or hoarseness - Damage to heart, brain, nerves, lungs, other parts of body or loss of life  Patient voiced understanding.)       Anesthesia Quick Evaluation

## 2022-07-15 NOTE — Discharge Instructions (Signed)

## 2022-07-15 NOTE — Telephone Encounter (Signed)
Transition Care Management Unsuccessful Follow-up Telephone Call  Date of discharge and from where:  [facility]  07/09/2022  Attempts:  2nd Attempt  Reason for unsuccessful TCM follow-up call:  No answer/busy

## 2022-07-20 ENCOUNTER — Other Ambulatory Visit: Payer: Self-pay

## 2022-07-20 ENCOUNTER — Ambulatory Visit: Payer: Medicare HMO | Admitting: Anesthesiology

## 2022-07-20 ENCOUNTER — Encounter: Payer: Self-pay | Admitting: Ophthalmology

## 2022-07-20 ENCOUNTER — Encounter
Admission: RE | Disposition: A | Discharge status: Home / Self Care | Payer: Self-pay | Source: Home / Self Care | Attending: Ophthalmology

## 2022-07-20 ENCOUNTER — Ambulatory Visit
Admission: RE | Admit: 2022-07-20 | Discharge: 2022-07-20 | Disposition: A | Discharge status: Home / Self Care | Payer: Medicare HMO | Attending: Ophthalmology | Admitting: Ophthalmology

## 2022-07-20 DIAGNOSIS — G4730 Sleep apnea, unspecified: Secondary | ICD-10-CM | POA: Diagnosis not present

## 2022-07-20 DIAGNOSIS — Z87891 Personal history of nicotine dependence: Secondary | ICD-10-CM | POA: Diagnosis not present

## 2022-07-20 DIAGNOSIS — I5042 Chronic combined systolic (congestive) and diastolic (congestive) heart failure: Secondary | ICD-10-CM | POA: Diagnosis not present

## 2022-07-20 DIAGNOSIS — J449 Chronic obstructive pulmonary disease, unspecified: Secondary | ICD-10-CM | POA: Insufficient documentation

## 2022-07-20 DIAGNOSIS — I110 Hypertensive heart disease with heart failure: Secondary | ICD-10-CM | POA: Insufficient documentation

## 2022-07-20 DIAGNOSIS — Z794 Long term (current) use of insulin: Secondary | ICD-10-CM | POA: Diagnosis not present

## 2022-07-20 DIAGNOSIS — Z6832 Body mass index (BMI) 32.0-32.9, adult: Secondary | ICD-10-CM | POA: Insufficient documentation

## 2022-07-20 DIAGNOSIS — I480 Paroxysmal atrial fibrillation: Secondary | ICD-10-CM | POA: Insufficient documentation

## 2022-07-20 DIAGNOSIS — I252 Old myocardial infarction: Secondary | ICD-10-CM | POA: Insufficient documentation

## 2022-07-20 DIAGNOSIS — E1136 Type 2 diabetes mellitus with diabetic cataract: Secondary | ICD-10-CM | POA: Diagnosis not present

## 2022-07-20 DIAGNOSIS — E7800 Pure hypercholesterolemia, unspecified: Secondary | ICD-10-CM | POA: Insufficient documentation

## 2022-07-20 DIAGNOSIS — H2511 Age-related nuclear cataract, right eye: Secondary | ICD-10-CM | POA: Insufficient documentation

## 2022-07-20 DIAGNOSIS — Z79899 Other long term (current) drug therapy: Secondary | ICD-10-CM | POA: Diagnosis not present

## 2022-07-20 DIAGNOSIS — Z7984 Long term (current) use of oral hypoglycemic drugs: Secondary | ICD-10-CM | POA: Insufficient documentation

## 2022-07-20 HISTORY — DX: Presence of dental prosthetic device (complete) (partial): Z97.2

## 2022-07-20 HISTORY — DX: Paroxysmal atrial fibrillation: I48.0

## 2022-07-20 HISTORY — DX: Other ill-defined heart diseases: I51.89

## 2022-07-20 HISTORY — DX: Nonrheumatic mitral (valve) insufficiency: I34.0

## 2022-07-20 HISTORY — DX: Primary open-angle glaucoma, unspecified eye, stage unspecified: H40.1190

## 2022-07-20 HISTORY — PX: CATARACT EXTRACTION W/PHACO: SHX586

## 2022-07-20 HISTORY — DX: Chronic respiratory failure with hypoxia: J96.11

## 2022-07-20 LAB — GLUCOSE, CAPILLARY: Glucose-Capillary: 110 mg/dL — ABNORMAL HIGH (ref 70–99)

## 2022-07-20 SURGERY — PHACOEMULSIFICATION, CATARACT, WITH IOL INSERTION
Anesthesia: Monitor Anesthesia Care | Site: Eye | Laterality: Right

## 2022-07-20 MED ORDER — SIGHTPATH DOSE#1 BSS IO SOLN
INTRAOCULAR | Status: DC | PRN
  Administered 2022-07-20: 1 mL via INTRAMUSCULAR

## 2022-07-20 MED ORDER — SIGHTPATH DOSE#1 BSS IO SOLN
INTRAOCULAR | Status: DC | PRN
  Administered 2022-07-20: 46 mL via OPHTHALMIC

## 2022-07-20 MED ORDER — LACTATED RINGERS IV SOLN: INTRAVENOUS | Status: DC

## 2022-07-20 MED ORDER — SIGHTPATH DOSE#1 NA CHONDROIT SULF-NA HYALURON 40-17 MG/ML IO SOLN
INTRAOCULAR | Status: DC | PRN
  Administered 2022-07-20: 1 mL via INTRAOCULAR

## 2022-07-20 MED ORDER — MOXIFLOXACIN HCL 0.5 % OP SOLN
OPHTHALMIC | Status: DC | PRN
  Administered 2022-07-20: .2 mL via OPHTHALMIC

## 2022-07-20 MED ORDER — MIDAZOLAM HCL 2 MG/2ML IJ SOLN
INTRAMUSCULAR | Status: DC | PRN
  Administered 2022-07-20 (×2): 1 mg via INTRAVENOUS

## 2022-07-20 MED ORDER — TETRACAINE HCL 0.5 % OP SOLN
1.0000 [drp] | OPHTHALMIC | Status: DC | PRN
  Administered 2022-07-20 (×3): 1 [drp] via OPHTHALMIC

## 2022-07-20 MED ORDER — SIGHTPATH DOSE#1 BSS IO SOLN
INTRAOCULAR | Status: DC | PRN
  Administered 2022-07-20: 15 mL

## 2022-07-20 MED ORDER — ARMC OPHTHALMIC DILATING DROPS
1.0000 | OPHTHALMIC | Status: DC | PRN
  Administered 2022-07-20 (×3): 1 via OPHTHALMIC

## 2022-07-20 MED ORDER — FENTANYL CITRATE (PF) 100 MCG/2ML IJ SOLN
INTRAMUSCULAR | Status: DC | PRN
  Administered 2022-07-20: 50 ug via INTRAVENOUS

## 2022-07-20 MED ORDER — BRIMONIDINE TARTRATE-TIMOLOL 0.2-0.5 % OP SOLN
OPHTHALMIC | Status: DC | PRN
  Administered 2022-07-20: 1 [drp] via OPHTHALMIC

## 2022-07-20 SURGICAL SUPPLY — 11 items
ANGLE REVERSE CUT SHRT 25GA (CUTTER) ×1
CATARACT SUITE SIGHTPATH (MISCELLANEOUS) ×1 IMPLANT
CYSTOTOME ANGL RVRS SHRT 25G (CUTTER) ×1 IMPLANT
CYSTOTOME ANGL RVRS SHRT 25GA (CUTTER) ×1 IMPLANT
FEE CATARACT SUITE SIGHTPATH (MISCELLANEOUS) ×1 IMPLANT
GLOVE BIOGEL PI IND STRL 8 (GLOVE) ×1 IMPLANT
GLOVE SURG ENC TEXT LTX SZ8 (GLOVE) ×1 IMPLANT
LENS IOL TECNIS EYHANCE 16.5 (Intraocular Lens) IMPLANT
NDL FILTER BLUNT 18X1 1/2 (NEEDLE) ×1 IMPLANT
NEEDLE FILTER BLUNT 18X1 1/2 (NEEDLE) ×1 IMPLANT
SYR 3ML LL SCALE MARK (SYRINGE) ×1 IMPLANT

## 2022-07-20 NOTE — Op Note (Signed)
PREOPERATIVE DIAGNOSIS:  Nuclear sclerotic cataract of the right eye.   POSTOPERATIVE DIAGNOSIS:  H25.11 Cataract   OPERATIVE PROCEDURE:ORPROCALL@   SURGEON:  Galen Manila, MD.   ANESTHESIA:  Anesthesiologist: Marisue Humble, MD CRNA: Emeterio Reeve, CRNA  1.      Managed anesthesia care. 2.      0.35ml of Shugarcaine was instilled in the eye following the paracentesis.   COMPLICATIONS:  None.   TECHNIQUE:   Stop and chop   DESCRIPTION OF PROCEDURE:  The patient was examined and consented in the preoperative holding area where the aforementioned topical anesthesia was applied to the right eye and then brought back to the Operating Room where the right eye was prepped and draped in the usual sterile ophthalmic fashion and a lid speculum was placed. A paracentesis was created with the side port blade and the anterior chamber was filled with viscoelastic. A near clear corneal incision was performed with the steel keratome. A continuous curvilinear capsulorrhexis was performed with a cystotome followed by the capsulorrhexis forceps. Hydrodissection and hydrodelineation were carried out with BSS on a blunt cannula. The lens was removed in a stop and chop  technique and the remaining cortical material was removed with the irrigation-aspiration handpiece. The capsular bag was inflated with viscoelastic and the Technis [id number]  lens was placed in the capsular bag without complication. The remaining viscoelastic was removed from the eye with the irrigation-aspiration handpiece. The wounds were hydrated. The anterior chamber was flushed with BSS and the eye was inflated to physiologic pressure. 0.43ml of Vigamox was placed in the anterior chamber. The wounds were found to be water tight. The eye was dressed with Combigan. The patient was given protective glasses to wear throughout the day and a shield with which to sleep tonight. The patient was also given drops with which to begin a drop regimen  today and will follow-up with me in one day. Implant Name Type Inv. Item Serial No. Manufacturer Lot No. LRB No. Used Action  LENS IOL TECNIS EYHANCE 16.5 - [id number] Intraocular Lens LENS IOL TECNIS EYHANCE 16.5 [id number] [employer]  Right 1 Implanted   Procedure(s) with comments: CATARACT EXTRACTION PHACO AND INTRAOCULAR LENS PLACEMENT (IOC) RIGHT DIABETIC  8.38  00:46.3 (Right) - Diabetic  Electronically signed: Galen Manila 07/20/2022 8:41 AM

## 2022-07-20 NOTE — Transfer of Care (Signed)
Immediate Anesthesia Transfer of Care Note  Patient: Brenda Wu  Procedure(s) Performed: CATARACT EXTRACTION PHACO AND INTRAOCULAR LENS PLACEMENT (IOC) RIGHT DIABETIC  8.38  00:46.3 (Right: Eye)  Patient Location: PACU  Anesthesia Type: MAC  Level of Consciousness: awake, alert  and patient cooperative  Airway and Oxygen Therapy: Patient Spontanous Breathing and Patient connected to supplemental oxygen  Post-op Assessment: Post-op Vital signs reviewed, Patient's Cardiovascular Status Stable, Respiratory Function Stable, Patent Airway and No signs of Nausea or vomiting  Post-op Vital Signs: Reviewed and stable  Complications: No notable events documented.

## 2022-07-20 NOTE — H&P (Signed)
[facility]   Primary Care Physician:  [name], MD Ophthalmologist: Dr. Druscilla Brownie  Pre-Procedure History & Physical: HPI:  Brenda Wu is a 67 y.o. female here for cataract surgery.   Past Medical History:  Diagnosis Date   Chronic combined systolic (congestive) and diastolic (congestive) heart failure (HCC)    a. 10/2017 Echo: EF 45-50%, diff HK. Mild to mod MR, mildly dil LA. Mod dil RV w/ mildly reduced RV fxn.   Chronic hypoxemic respiratory failure (HCC)    COPD (chronic obstructive pulmonary disease) (HCC)    Depression    Diabetes mellitus without complication (HCC)    Grade I diastolic dysfunction    Hypercholesteremia    Hypertension    Mild mitral regurgitation by prior echocardiogram    Morbid obesity (HCC)    NICM (nonischemic cardiomyopathy) (HCC)    NSTEMI (non-ST elevated myocardial infarction) (HCC)    a. 12/2017 -->Cath: nl cors. EF 45-50%.   Paroxysmal atrial fibrillation (HCC)    Primary open angle glaucoma    Seizures (HCC)    5/24 - none for over 8 yrs   Sleep apnea    Stroke [facility])    pt denies   Tricuspid regurgitation    a. 10/2017 Echo: Mild to mod TR.   Wears dentures    full upper and lower    Past Surgical History:  Procedure Laterality Date   ABDOMINAL HYSTERECTOMY     HARDWARE REMOVAL Left 03/08/2019   Procedure: HARDWARE REMOVAL OF DISTAL FEMUR;  Surgeon: Kennedy Bucker, MD;  Location: [facility] ORS;  Service: Orthopedics;  Laterality: Left;   LEFT HEART CATH AND CORONARY ANGIOGRAPHY N/A 01/09/2018   Procedure: LEFT HEART CATH AND CORONARY ANGIOGRAPHY;  Surgeon: Iran Ouch, MD;  Location: [facility] INVASIVE CV LAB;  Service: Cardiovascular;  Laterality: N/A;   ORIF FEMUR FRACTURE Left 05/12/2018   Procedure: OPEN REDUCTION INTERNAL FIXATION (ORIF) DISTAL FEMUR FRACTURE;  Surgeon: Kennedy Bucker, MD;  Location: [facility] ORS;  Service: Orthopedics;  Laterality: Left;   REPLACEMENT TOTAL KNEE      Prior to Admission medications    Medication Sig Start Date End Date Taking? Authorizing Provider  albuterol (VENTOLIN HFA) 108 (90 Base) MCG/ACT inhaler Inhale 2 puffs into the lungs 4 (four) times daily as needed for wheezing or shortness of breath. 08/28/21  Yes Valinda Hoar, NP  aspirin EC 81 MG tablet Take 1 tablet (81 mg total) by mouth daily. 02/13/18  Yes [name], MD  atorvastatin (LIPITOR) 40 MG tablet Take 40 mg by mouth daily.   Yes [provider]  diltiazem (TIAZAC) 180 MG 24 hr capsule Take 180 mg by mouth daily. 11/24/19  Yes [provider]  ergocalciferol (VITAMIN D2) 1.25 MG (50000 UT) capsule Take 50,000 Units by mouth once a week. 10/06/20  Yes [provider]  ferrous sulfate 324 MG TBEC Take 324 mg by mouth daily with breakfast.   Yes [provider]  Fluticasone-Umeclidin-Vilant (TRELEGY ELLIPTA) 100-62.5-25 MCG/INH AEPB Inhale 1 puff into the lungs 1 day or 1 dose.   Yes [provider]  glipiZIDE (GLUCOTROL) 10 MG tablet Take 10 mg by mouth 2 (two) times daily before a meal.    Yes [provider]  insulin glargine (LANTUS) 100 UNIT/ML injection Inject 15 Units into the skin daily.   Yes [provider]  ipratropium-albuterol (DUONEB) 0.5-2.5 (3) MG/3ML SOLN Take 3 mLs by nebulization as needed.   Yes [provider]  lamoTRIgine (LAMICTAL) 100  MG tablet 200 mg 2 (two) times daily. 11/24/19  Yes [provider]  latanoprost (XALATAN) 0.005 % ophthalmic solution 1 drop at bedtime.   Yes [provider]  losartan (COZAAR) 50 MG tablet Take 50 mg by mouth daily.   Yes [provider]  Multiple Vitamin (MULTI-VITAMINS) TABS Take 1 tablet by mouth daily.   Yes [provider]  OXYGEN Inhale 2 L into the lungs continuous.   Yes [provider]  potassium chloride SA (K-DUR,KLOR-CON) 20 MEQ tablet Take 1 tablet (20 mEq total) by mouth daily. 02/12/17  Yes [name], MD   pregabalin (LYRICA) 75 MG capsule Take 75 mg by mouth 2 (two) times daily.   Yes [provider]  sitaGLIPtin (JANUVIA) 100 MG tablet Take 100 mg by mouth daily.   Yes [provider]  tirzepatide Greggory Keen) 5 MG/0.5ML Pen Inject 5 mg into the skin once a week. Sunday   Yes [provider]  topiramate (TOPAMAX) 200 MG tablet Take 200 mg by mouth 2 (two) times daily.   Yes [provider]  torsemide (DEMADEX) 20 MG tablet Take 2 tablets (40 mg total) by mouth daily. Note dose change 04/15/21  Yes Hackney, Tina A, FNP  vitamin B-12 (CYANOCOBALAMIN) 500 MCG tablet Take 500 mcg by mouth daily.   Yes [provider]  benzonatate (TESSALON) 100 MG capsule Take 1 capsule (100 mg total) by mouth every 8 (eight) hours. Patient not taking: Reported on 07/20/2022 08/28/21   Valinda Hoar, NP  docusate sodium (COLACE) 100 MG capsule Take 1 capsule (100 mg total) by mouth 2 (two) times daily. Patient taking differently: Take 100 mg by mouth daily as needed. 05/16/18   [name], MD    Allergies as of 06/30/2022 - Review Complete 08/28/2021  Allergen Reaction Noted   Bismuth subsalicylate Rash 08/29/2015    Family History  Problem Relation Age of Onset   Hypertension Father    Asthma Sister    Heart murmur Sister    Diabetes Sister     Social History   Socioeconomic History   Marital status: Married    Spouse name: Not on file   Number of children: Not on file   Years of education: Not on file   Highest education level: Not on file  Occupational History   Not on file  Tobacco Use   Smoking status: Former    Packs/day: 1.00    Years: 30.00    Additional pack years: 0.00    Total pack years: 30.00    Types: Cigarettes    Quit date: 02/22/2002    Years since quitting: 20.4   Smokeless tobacco: Never  Vaping Use   Vaping Use: Never used  Substance and Sexual Activity   Alcohol use: No   Drug use: No   Sexual activity: Not on file   Other Topics Concern   Not on file  Social History Narrative   Not on file   Social Determinants of Health   Financial Resource Strain: Not on file  Food Insecurity: Not on file  Transportation Needs: Not on file  Physical Activity: Not on file  Stress: Not on file  Social Connections: Not on file  Intimate Partner Violence: Not on file    Review of Systems: See HPI, otherwise negative ROS  Physical Exam: BP 122/69   Temp 97.9 F (36.6 C) (Temporal)   Resp 12   Ht 5' 4.02" (1.626 m)   Wt 86.6 kg  SpO2 95%   BMI 32.77 kg/m  General:   Alert, cooperative in NAD Head:  Normocephalic and atraumatic. Respiratory:  Normal work of breathing. Cardiovascular:  RRR  Impression/Plan: Brenda Wu is here for cataract surgery.  Risks, benefits, limitations, and alternatives regarding cataract surgery have been reviewed with the patient.  Questions have been answered.  All parties agreeable.   Galen Manila, MD  07/20/2022, 8:18 AM

## 2022-07-20 NOTE — Anesthesia Postprocedure Evaluation (Signed)
Anesthesia Post Note  Patient: Brenda Wu  Procedure(s) Performed: CATARACT EXTRACTION PHACO AND INTRAOCULAR LENS PLACEMENT (IOC) RIGHT DIABETIC  8.38  00:46.3 (Right: Eye)  Patient location during evaluation: PACU Anesthesia Type: MAC Level of consciousness: awake and alert Pain management: pain level controlled Vital Signs Assessment: post-procedure vital signs reviewed and stable Respiratory status: spontaneous breathing, nonlabored ventilation, respiratory function stable and patient connected to nasal cannula oxygen Cardiovascular status: stable and blood pressure returned to baseline Postop Assessment: no apparent nausea or vomiting Anesthetic complications: no   No notable events documented.   Last Vitals:  Vitals:   07/20/22 0845 07/20/22 0851  BP: 103/67 112/75  Pulse: 72 70  Resp: 17 17  Temp: 36.5 C 36.5 C  SpO2: 98% 100%    Last Pain:  Vitals:   07/20/22 0851  TempSrc:   PainSc: 0-No pain                 Marisue Humble

## 2022-07-21 ENCOUNTER — Encounter: Payer: Self-pay | Admitting: Ophthalmology

## 2022-07-21 DIAGNOSIS — H2512 Age-related nuclear cataract, left eye: Secondary | ICD-10-CM | POA: Diagnosis not present

## 2022-07-28 ENCOUNTER — Encounter: Payer: Self-pay | Admitting: Ophthalmology

## 2022-07-28 NOTE — Anesthesia Preprocedure Evaluation (Addendum)
Anesthesia Evaluation  Patient identified by MRN, date of birth, ID band Patient awake    Reviewed: Allergy & Precautions, H&P , NPO status , Patient's Chart, lab work & pertinent test results  Airway Mallampati: III  TM Distance: >3 FB Neck ROM: Full    Dental no notable dental hx.    Pulmonary neg pulmonary ROS, asthma , sleep apnea , COPD, former smoker   Pulmonary exam normal breath sounds clear to auscultation       Cardiovascular hypertension, + Past MI and +CHF  negative cardio ROS Normal cardiovascular exam Rhythm:Regular Rate:Normal   Echo 03-18-20 EF 50-55%, mild LVH, grade I diastolic dysfunction, LA and RA mild dilation, mild MR      Neuro/Psych Seizures -,  PSYCHIATRIC DISORDERS  Depression    CVA negative neurological ROS  negative psych ROS   GI/Hepatic negative GI ROS, Neg liver ROS,,,  Endo/Other  negative endocrine ROSdiabetes    Renal/GU negative Renal ROS  negative genitourinary   Musculoskeletal negative musculoskeletal ROS (+) Arthritis ,    Abdominal   Peds negative pediatric ROS (+)  Hematology negative hematology ROS (+)   Anesthesia Other Findings Diabetes mellitus without complication Hypertension Hypercholesteremia Seizures  COPD   Stroke  Depression  Chronic combined systolic (congestive) and diastolic (congestive) heart failure  NICM (nonischemic cardiomyopathy)  NSTEMI (non-ST elevated myocardial infarction)  Tricuspid regurgitation Morbid obesity  Sleep apnea Wears dentures Mild mitral regurgitation by prior echocardiogram  Grade I diastolic dysfunction Paroxysmal atrial fibrillation  Primary open angle glaucoma Chronic hypoxemic respiratory failure    Reproductive/Obstetrics negative OB ROS                              Anesthesia Physical Anesthesia Plan  ASA: 3  Anesthesia Plan: MAC   Post-op Pain Management:    Induction:  Intravenous  PONV Risk Score and Plan:   Airway Management Planned: Natural Airway and Nasal Cannula  Additional Equipment:   Intra-op Plan:   Post-operative Plan:   Informed Consent: I have reviewed the patients History and Physical, chart, labs and discussed the procedure including the risks, benefits and alternatives for the proposed anesthesia with the patient or authorized representative who has indicated his/her understanding and acceptance.     Dental Advisory Given  Plan Discussed with: Anesthesiologist, CRNA and Surgeon  Anesthesia Plan Comments: (Patient consented for risks of anesthesia including but not limited to:  - adverse reactions to medications - damage to eyes, teeth, lips or other oral mucosa - nerve damage due to positioning  - sore throat or hoarseness - Damage to heart, brain, nerves, lungs, other parts of body or loss of life  Patient voiced understanding.)         Anesthesia Quick Evaluation

## 2022-07-29 NOTE — Discharge Instructions (Signed)

## 2022-08-03 ENCOUNTER — Encounter
Admission: RE | Disposition: A | Discharge status: Home / Self Care | Payer: Self-pay | Source: Home / Self Care | Attending: Ophthalmology

## 2022-08-03 ENCOUNTER — Ambulatory Visit: Payer: Medicare HMO | Admitting: Anesthesiology

## 2022-08-03 ENCOUNTER — Other Ambulatory Visit: Payer: Self-pay

## 2022-08-03 ENCOUNTER — Ambulatory Visit
Admission: RE | Admit: 2022-08-03 | Discharge: 2022-08-03 | Disposition: A | Discharge status: Home / Self Care | Payer: Medicare HMO | Attending: Ophthalmology | Admitting: Ophthalmology

## 2022-08-03 DIAGNOSIS — Z7985 Long-term (current) use of injectable non-insulin antidiabetic drugs: Secondary | ICD-10-CM | POA: Diagnosis not present

## 2022-08-03 DIAGNOSIS — Z87891 Personal history of nicotine dependence: Secondary | ICD-10-CM | POA: Diagnosis not present

## 2022-08-03 DIAGNOSIS — E7800 Pure hypercholesterolemia, unspecified: Secondary | ICD-10-CM | POA: Insufficient documentation

## 2022-08-03 DIAGNOSIS — H2512 Age-related nuclear cataract, left eye: Secondary | ICD-10-CM | POA: Insufficient documentation

## 2022-08-03 DIAGNOSIS — Z794 Long term (current) use of insulin: Secondary | ICD-10-CM | POA: Diagnosis not present

## 2022-08-03 DIAGNOSIS — I110 Hypertensive heart disease with heart failure: Secondary | ICD-10-CM | POA: Insufficient documentation

## 2022-08-03 DIAGNOSIS — G4730 Sleep apnea, unspecified: Secondary | ICD-10-CM | POA: Diagnosis not present

## 2022-08-03 DIAGNOSIS — Z6833 Body mass index (BMI) 33.0-33.9, adult: Secondary | ICD-10-CM | POA: Insufficient documentation

## 2022-08-03 DIAGNOSIS — Z7984 Long term (current) use of oral hypoglycemic drugs: Secondary | ICD-10-CM | POA: Insufficient documentation

## 2022-08-03 DIAGNOSIS — I5042 Chronic combined systolic (congestive) and diastolic (congestive) heart failure: Secondary | ICD-10-CM | POA: Diagnosis not present

## 2022-08-03 DIAGNOSIS — E1136 Type 2 diabetes mellitus with diabetic cataract: Secondary | ICD-10-CM | POA: Diagnosis not present

## 2022-08-03 DIAGNOSIS — Z09 Encounter for follow-up examination after completed treatment for conditions other than malignant neoplasm: Secondary | ICD-10-CM | POA: Diagnosis not present

## 2022-08-03 DIAGNOSIS — J449 Chronic obstructive pulmonary disease, unspecified: Secondary | ICD-10-CM | POA: Insufficient documentation

## 2022-08-03 DIAGNOSIS — J9611 Chronic respiratory failure with hypoxia: Secondary | ICD-10-CM | POA: Insufficient documentation

## 2022-08-03 DIAGNOSIS — Z8673 Personal history of transient ischemic attack (TIA), and cerebral infarction without residual deficits: Secondary | ICD-10-CM | POA: Diagnosis not present

## 2022-08-03 DIAGNOSIS — I252 Old myocardial infarction: Secondary | ICD-10-CM | POA: Diagnosis not present

## 2022-08-03 HISTORY — PX: CATARACT EXTRACTION W/PHACO: SHX586

## 2022-08-03 LAB — GLUCOSE, CAPILLARY: Glucose-Capillary: 138 mg/dL — ABNORMAL HIGH (ref 70–99)

## 2022-08-03 SURGERY — PHACOEMULSIFICATION, CATARACT, WITH IOL INSERTION
Anesthesia: Monitor Anesthesia Care | Laterality: Left

## 2022-08-03 MED ORDER — SIGHTPATH DOSE#1 NA CHONDROIT SULF-NA HYALURON 40-17 MG/ML IO SOLN
INTRAOCULAR | Status: DC | PRN
  Administered 2022-08-03: 1 mL via INTRAOCULAR

## 2022-08-03 MED ORDER — SIGHTPATH DOSE#1 BSS IO SOLN
INTRAOCULAR | Status: DC | PRN
  Administered 2022-08-03: 15 mL via INTRAOCULAR

## 2022-08-03 MED ORDER — TETRACAINE HCL 0.5 % OP SOLN
1.0000 [drp] | OPHTHALMIC | Status: DC | PRN
  Administered 2022-08-03 (×3): 1 [drp] via OPHTHALMIC

## 2022-08-03 MED ORDER — SIGHTPATH DOSE#1 BSS IO SOLN
INTRAOCULAR | Status: DC | PRN
  Administered 2022-08-03: 41 mL via OPHTHALMIC

## 2022-08-03 MED ORDER — MIDAZOLAM HCL 2 MG/2ML IJ SOLN
INTRAMUSCULAR | Status: DC | PRN
  Administered 2022-08-03: 2 mg via INTRAVENOUS

## 2022-08-03 MED ORDER — LACTATED RINGERS IV SOLN: INTRAVENOUS | Status: DC

## 2022-08-03 MED ORDER — BRIMONIDINE TARTRATE-TIMOLOL 0.2-0.5 % OP SOLN
OPHTHALMIC | Status: DC | PRN
  Administered 2022-08-03: 1 [drp] via OPHTHALMIC

## 2022-08-03 MED ORDER — ARMC OPHTHALMIC DILATING DROPS
1.0000 | OPHTHALMIC | Status: DC | PRN
  Administered 2022-08-03 (×3): 1 via OPHTHALMIC

## 2022-08-03 MED ORDER — SIGHTPATH DOSE#1 BSS IO SOLN
INTRAOCULAR | Status: DC | PRN
  Administered 2022-08-03: 2 mL

## 2022-08-03 MED ORDER — MOXIFLOXACIN HCL 0.5 % OP SOLN
OPHTHALMIC | Status: DC | PRN
  Administered 2022-08-03: .2 mL via OPHTHALMIC

## 2022-08-03 MED ORDER — FENTANYL CITRATE (PF) 100 MCG/2ML IJ SOLN
INTRAMUSCULAR | Status: DC | PRN
  Administered 2022-08-03: 50 ug via INTRAVENOUS

## 2022-08-03 SURGICAL SUPPLY — 17 items
ANGLE REVERSE CUT SHRT 25GA (CUTTER) ×1
CANNULA ANT/CHMB 27G (MISCELLANEOUS) IMPLANT
CANNULA ANT/CHMB 27GA (MISCELLANEOUS) IMPLANT
CATARACT SUITE SIGHTPATH (MISCELLANEOUS) ×1 IMPLANT
CYSTOTOME ANGL RVRS SHRT 25G (CUTTER) ×1 IMPLANT
CYSTOTOME ANGL RVRS SHRT 25GA (CUTTER) ×1 IMPLANT
FEE CATARACT SUITE SIGHTPATH (MISCELLANEOUS) ×1 IMPLANT
GLOVE BIOGEL PI IND STRL 8 (GLOVE) ×1 IMPLANT
GLOVE SURG ENC TEXT LTX SZ8 (GLOVE) ×1 IMPLANT
LENS IOL TECNIS EYHANCE 18.0 (Intraocular Lens) IMPLANT
NDL FILTER BLUNT 18X1 1/2 (NEEDLE) ×1 IMPLANT
NEEDLE FILTER BLUNT 18X1 1/2 (NEEDLE) ×1 IMPLANT
PACK VIT ANT 23G (MISCELLANEOUS) IMPLANT
RING MALYGIN (MISCELLANEOUS) IMPLANT
SUT ETHILON 10-0 CS-B-6CS-B-6 (SUTURE)
SUTURE EHLN 10-0 CS-B-6CS-B-6 (SUTURE) IMPLANT
SYR 3ML LL SCALE MARK (SYRINGE) ×1 IMPLANT

## 2022-08-03 NOTE — Op Note (Signed)
PREOPERATIVE DIAGNOSIS:  Nuclear sclerotic cataract of the left eye.   POSTOPERATIVE DIAGNOSIS:  Nuclear sclerotic cataract of the left eye.   OPERATIVE PROCEDURE:ORPROCALL@   SURGEON:  Galen Manila, MD.   ANESTHESIA:  Anesthesiologist: Marisue Humble, MD CRNA: Barbette Hair, CRNA  1.      Managed anesthesia care. 2.     0.14ml of Shugarcaine was instilled following the paracentesis   COMPLICATIONS:  None.   TECHNIQUE:   Stop and chop   DESCRIPTION OF PROCEDURE:  The patient was examined and consented in the preoperative holding area where the aforementioned topical anesthesia was applied to the left eye and then brought back to the Operating Room where the left eye was prepped and draped in the usual sterile ophthalmic fashion and a lid speculum was placed. A paracentesis was created with the side port blade and the anterior chamber was filled with viscoelastic. A near clear corneal incision was performed with the steel keratome. A continuous curvilinear capsulorrhexis was performed with a cystotome followed by the capsulorrhexis forceps. Hydrodissection and hydrodelineation were carried out with BSS on a blunt cannula. The lens was removed in a stop and chop  technique and the remaining cortical material was removed with the irrigation-aspiration handpiece. The capsular bag was inflated with viscoelastic and the Technis [id number] lens was placed in the capsular bag without complication. The remaining viscoelastic was removed from the eye with the irrigation-aspiration handpiece. The wounds were hydrated. The anterior chamber was flushed with BSS and the eye was inflated to physiologic pressure. 0.48ml Vigamox was placed in the anterior chamber. The wounds were found to be water tight. The eye was dressed with Combigan. The patient was given protective glasses to wear throughout the day and a shield with which to sleep tonight. The patient was also given drops with which to begin a drop regimen  today and will follow-up with me in one day. Implant Name Type Inv. Item Serial No. Manufacturer Lot No. LRB No. Used Action  LENS IOL TECNIS EYHANCE 18.0 - [id number] Intraocular Lens LENS IOL TECNIS EYHANCE 18.0 [id number] [employer]  Left 1 Implanted    Procedure(s) with comments: CATARACT EXTRACTION PHACO AND INTRAOCULAR LENS PLACEMENT (IOC) LEFT DIABETIC 7.23 00:44.4 (Left) - Diabetic  Electronically signed: Galen Manila 08/03/2022 11:31 AM

## 2022-08-03 NOTE — H&P (Signed)
[facility]   Primary Care Physician:  [name], MD Ophthalmologist: Dr. Druscilla Brownie  Pre-Procedure History & Physical: HPI:  Brenda Wu is a 67 y.o. female here for cataract surgery.   Past Medical History:  Diagnosis Date   Chronic combined systolic (congestive) and diastolic (congestive) heart failure (HCC)    a. 10/2017 Echo: EF 45-50%, diff HK. Mild to mod MR, mildly dil LA. Mod dil RV w/ mildly reduced RV fxn.   Chronic hypoxemic respiratory failure (HCC)    COPD (chronic obstructive pulmonary disease) (HCC)    Depression    Diabetes mellitus without complication (HCC)    Grade I diastolic dysfunction    Hypercholesteremia    Hypertension    Mild mitral regurgitation by prior echocardiogram    Morbid obesity (HCC)    NICM (nonischemic cardiomyopathy) (HCC)    NSTEMI (non-ST elevated myocardial infarction) (HCC)    a. 12/2017 -->Cath: nl cors. EF 45-50%.   Paroxysmal atrial fibrillation (HCC)    Primary open angle glaucoma    Seizures (HCC)    5/24 - none for over 8 yrs   Sleep apnea    Stroke [facility])    pt denies   Tricuspid regurgitation    a. 10/2017 Echo: Mild to mod TR.   Wears dentures    full upper and lower    Past Surgical History:  Procedure Laterality Date   ABDOMINAL HYSTERECTOMY     CATARACT EXTRACTION W/PHACO Right 07/20/2022   Procedure: CATARACT EXTRACTION PHACO AND INTRAOCULAR LENS PLACEMENT (IOC) RIGHT DIABETIC  8.38  00:46.3;  Surgeon: Galen Manila, MD;  Location: [facility];  Service: Ophthalmology;  Laterality: Right;  Diabetic   HARDWARE REMOVAL Left 03/08/2019   Procedure: HARDWARE REMOVAL OF DISTAL FEMUR;  Surgeon: Kennedy Bucker, MD;  Location: [facility] ORS;  Service: Orthopedics;  Laterality: Left;   LEFT HEART CATH AND CORONARY ANGIOGRAPHY N/A 01/09/2018   Procedure: LEFT HEART CATH AND CORONARY ANGIOGRAPHY;  Surgeon: Iran Ouch, MD;  Location: [facility] INVASIVE CV LAB;  Service: Cardiovascular;  Laterality: N/A;    ORIF FEMUR FRACTURE Left 05/12/2018   Procedure: OPEN REDUCTION INTERNAL FIXATION (ORIF) DISTAL FEMUR FRACTURE;  Surgeon: Kennedy Bucker, MD;  Location: [facility] ORS;  Service: Orthopedics;  Laterality: Left;   REPLACEMENT TOTAL KNEE      Prior to Admission medications   Medication Sig Start Date End Date Taking? Authorizing Provider  albuterol (VENTOLIN HFA) 108 (90 Base) MCG/ACT inhaler Inhale 2 puffs into the lungs 4 (four) times daily as needed for wheezing or shortness of breath. 08/28/21  Yes Valinda Hoar, NP  aspirin EC 81 MG tablet Take 1 tablet (81 mg total) by mouth daily. 02/13/18  Yes [name], MD  atorvastatin (LIPITOR) 40 MG tablet Take 40 mg by mouth daily.   Yes [provider]  diltiazem (TIAZAC) 180 MG 24 hr capsule Take 180 mg by mouth daily. 11/24/19  Yes [provider]  docusate sodium (COLACE) 100 MG capsule Take 1 capsule (100 mg total) by mouth 2 (two) times daily. Patient taking differently: Take 100 mg by mouth daily as needed. 05/16/18  Yes [name], MD  ergocalciferol (VITAMIN D2) 1.25 MG (50000 UT) capsule Take 50,000 Units by mouth once a week. 10/06/20  Yes [provider]  ferrous sulfate 324 MG TBEC Take 324 mg by mouth daily with breakfast.   Yes [provider]  Fluticasone-Umeclidin-Vilant (TRELEGY ELLIPTA) 100-62.5-25 MCG/INH AEPB Inhale 1 puff into the lungs 1 day  or 1 dose.   Yes [provider]  glipiZIDE (GLUCOTROL) 10 MG tablet Take 10 mg by mouth 2 (two) times daily before a meal.    Yes [provider]  insulin glargine (LANTUS) 100 UNIT/ML injection Inject 15 Units into the skin daily.   Yes [provider]  ipratropium-albuterol (DUONEB) 0.5-2.5 (3) MG/3ML SOLN Take 3 mLs by nebulization as needed.   Yes [provider]  lamoTRIgine (LAMICTAL) 100 MG tablet 200 mg 2 (two) times daily. 11/24/19  Yes [provider]  latanoprost (XALATAN) 0.005 % ophthalmic  solution 1 drop at bedtime.   Yes [provider]  losartan (COZAAR) 50 MG tablet Take 50 mg by mouth daily.   Yes [provider]  Multiple Vitamin (MULTI-VITAMINS) TABS Take 1 tablet by mouth daily.   Yes [provider]  OXYGEN Inhale 2 L into the lungs continuous.   Yes [provider]  potassium chloride SA (K-DUR,KLOR-CON) 20 MEQ tablet Take 1 tablet (20 mEq total) by mouth daily. 02/12/17  Yes [name], MD  pregabalin (LYRICA) 75 MG capsule Take 75 mg by mouth 2 (two) times daily.   Yes [provider]  sitaGLIPtin (JANUVIA) 100 MG tablet Take 100 mg by mouth daily.   Yes [provider]  tirzepatide Greggory Keen) 5 MG/0.5ML Pen Inject 5 mg into the skin once a week. Sunday   Yes [provider]  topiramate (TOPAMAX) 200 MG tablet Take 200 mg by mouth 2 (two) times daily.   Yes [provider]  torsemide (DEMADEX) 20 MG tablet Take 2 tablets (40 mg total) by mouth daily. Note dose change 04/15/21  Yes Hackney, Tina A, FNP  vitamin B-12 (CYANOCOBALAMIN) 500 MCG tablet Take 500 mcg by mouth daily.   Yes [provider]  benzonatate (TESSALON) 100 MG capsule Take 1 capsule (100 mg total) by mouth every 8 (eight) hours. Patient not taking: Reported on 07/20/2022 08/28/21   Valinda Hoar, NP    Allergies as of 06/30/2022 - Review Complete 08/28/2021  Allergen Reaction Noted   Bismuth subsalicylate Rash 08/29/2015    Family History  Problem Relation Age of Onset   Hypertension Father    Asthma Sister    Heart murmur Sister    Diabetes Sister     Social History   Socioeconomic History   Marital status: Married    Spouse name: Not on file   Number of children: Not on file   Years of education: Not on file   Highest education level: Not on file  Occupational History   Not on file  Tobacco Use   Smoking status: Former    Packs/day: 1.00    Years: 30.00    Additional pack years: 0.00     Total pack years: 30.00    Types: Cigarettes    Quit date: 02/22/2002    Years since quitting: 20.4   Smokeless tobacco: Never  Vaping Use   Vaping Use: Never used  Substance and Sexual Activity   Alcohol use: No   Drug use: No   Sexual activity: Not on file  Other Topics Concern   Not on file  Social History Narrative   Not on file   Social Determinants of Health   Financial Resource Strain: Not on file  Food Insecurity: Not on file  Transportation Needs: Not on file  Physical Activity: Not on file  Stress: Not on file  Social Connections: Not on file  Intimate Partner Violence: Not  on file    Review of Systems: See HPI, otherwise negative ROS  Physical Exam: BP 139/74   Pulse 83   Temp 98.7 F (37.1 C) (Temporal)   Resp 16   Ht 5\' 4"  (1.626 m)   Wt 89.8 kg   SpO2 96%   BMI 33.99 kg/m  General:   Alert, cooperative in NAD Head:  Normocephalic and atraumatic. Respiratory:  Normal work of breathing. Cardiovascular:  RRR  Impression/Plan: Brenda Wu is here for cataract surgery.  Risks, benefits, limitations, and alternatives regarding cataract surgery have been reviewed with the patient.  Questions have been answered.  All parties agreeable.   Galen Manila, MD  08/03/2022, 11:09 AM

## 2022-08-03 NOTE — Transfer of Care (Signed)
Immediate Anesthesia Transfer of Care Note  Patient: Brenda Wu  Procedure(s) Performed: CATARACT EXTRACTION PHACO AND INTRAOCULAR LENS PLACEMENT (IOC) LEFT DIABETIC 7.23 00:44.4 (Left)  Patient Location: PACU  Anesthesia Type: MAC  Level of Consciousness: awake, alert  and patient cooperative  Airway and Oxygen Therapy: Patient Spontanous Breathing and Patient connected to supplemental oxygen  Post-op Assessment: Post-op Vital signs reviewed, Patient's Cardiovascular Status Stable, Respiratory Function Stable, Patent Airway and No signs of Nausea or vomiting  Post-op Vital Signs: Reviewed and stable  Complications: No notable events documented.

## 2022-08-03 NOTE — Anesthesia Postprocedure Evaluation (Signed)
Anesthesia Post Note  Patient: Brenda Wu  Procedure(s) Performed: CATARACT EXTRACTION PHACO AND INTRAOCULAR LENS PLACEMENT (IOC) LEFT DIABETIC 7.23 00:44.4 (Left)  Patient location during evaluation: PACU Anesthesia Type: MAC Level of consciousness: awake and alert Pain management: pain level controlled Vital Signs Assessment: post-procedure vital signs reviewed and stable Respiratory status: spontaneous breathing, nonlabored ventilation, respiratory function stable and patient connected to nasal cannula oxygen Cardiovascular status: stable and blood pressure returned to baseline Postop Assessment: no apparent nausea or vomiting Anesthetic complications: no   No notable events documented.   Last Vitals:  Vitals:   08/03/22 1133 08/03/22 1137  BP: 118/65 128/67  Pulse: 68 69  Resp: 18 18  Temp: (!) 36.2 C (!) 36.4 C  SpO2: 99% 100%    Last Pain:  Vitals:   08/03/22 1137  TempSrc:   PainSc: 0-No pain                 Viliami Bracco C Kenda Kloehn

## 2022-08-04 ENCOUNTER — Encounter: Payer: Self-pay | Admitting: Ophthalmology

## 2022-08-23 DIAGNOSIS — R899 Unspecified abnormal finding in specimens from other organs, systems and tissues: Secondary | ICD-10-CM | POA: Diagnosis not present

## 2022-08-23 DIAGNOSIS — Z0100 Encounter for examination of eyes and vision without abnormal findings: Secondary | ICD-10-CM | POA: Diagnosis not present

## 2022-09-03 DIAGNOSIS — J449 Chronic obstructive pulmonary disease, unspecified: Secondary | ICD-10-CM | POA: Diagnosis not present

## 2022-09-03 DIAGNOSIS — G40909 Epilepsy, unspecified, not intractable, without status epilepticus: Secondary | ICD-10-CM | POA: Diagnosis not present

## 2022-09-03 DIAGNOSIS — M19042 Primary osteoarthritis, left hand: Secondary | ICD-10-CM | POA: Diagnosis not present

## 2022-09-03 DIAGNOSIS — Z0000 Encounter for general adult medical examination without abnormal findings: Secondary | ICD-10-CM | POA: Diagnosis not present

## 2022-09-03 DIAGNOSIS — Z794 Long term (current) use of insulin: Secondary | ICD-10-CM | POA: Diagnosis not present

## 2022-09-03 DIAGNOSIS — Z780 Asymptomatic menopausal state: Secondary | ICD-10-CM | POA: Diagnosis not present

## 2022-09-03 DIAGNOSIS — E1140 Type 2 diabetes mellitus with diabetic neuropathy, unspecified: Secondary | ICD-10-CM | POA: Diagnosis not present

## 2022-09-03 DIAGNOSIS — I5022 Chronic systolic (congestive) heart failure: Secondary | ICD-10-CM | POA: Diagnosis not present

## 2022-09-27 DIAGNOSIS — M79643 Pain in unspecified hand: Secondary | ICD-10-CM | POA: Diagnosis not present

## 2022-09-27 DIAGNOSIS — M7989 Other specified soft tissue disorders: Secondary | ICD-10-CM | POA: Diagnosis not present

## 2022-09-27 DIAGNOSIS — M19042 Primary osteoarthritis, left hand: Secondary | ICD-10-CM | POA: Diagnosis not present

## 2022-09-27 DIAGNOSIS — M778 Other enthesopathies, not elsewhere classified: Secondary | ICD-10-CM | POA: Diagnosis not present

## 2022-10-01 DIAGNOSIS — J441 Chronic obstructive pulmonary disease with (acute) exacerbation: Secondary | ICD-10-CM | POA: Diagnosis not present

## 2022-10-01 DIAGNOSIS — Z780 Asymptomatic menopausal state: Secondary | ICD-10-CM | POA: Diagnosis not present

## 2022-10-01 DIAGNOSIS — G4733 Obstructive sleep apnea (adult) (pediatric): Secondary | ICD-10-CM | POA: Diagnosis not present

## 2022-10-01 DIAGNOSIS — J449 Chronic obstructive pulmonary disease, unspecified: Secondary | ICD-10-CM | POA: Diagnosis not present

## 2022-11-23 DIAGNOSIS — H40009 Preglaucoma, unspecified, unspecified eye: Secondary | ICD-10-CM | POA: Diagnosis not present

## 2022-11-23 DIAGNOSIS — E119 Type 2 diabetes mellitus without complications: Secondary | ICD-10-CM | POA: Diagnosis not present

## 2022-11-23 DIAGNOSIS — H401131 Primary open-angle glaucoma, bilateral, mild stage: Secondary | ICD-10-CM | POA: Diagnosis not present

## 2022-12-30 DIAGNOSIS — Z794 Long term (current) use of insulin: Secondary | ICD-10-CM | POA: Diagnosis not present

## 2022-12-30 DIAGNOSIS — N1831 Chronic kidney disease, stage 3a: Secondary | ICD-10-CM | POA: Diagnosis not present

## 2022-12-30 DIAGNOSIS — E1122 Type 2 diabetes mellitus with diabetic chronic kidney disease: Secondary | ICD-10-CM | POA: Diagnosis not present

## 2023-01-04 DIAGNOSIS — M659 Unspecified synovitis and tenosynovitis, unspecified site: Secondary | ICD-10-CM | POA: Diagnosis not present

## 2023-01-04 DIAGNOSIS — Z23 Encounter for immunization: Secondary | ICD-10-CM | POA: Diagnosis not present

## 2023-01-04 DIAGNOSIS — Z794 Long term (current) use of insulin: Secondary | ICD-10-CM | POA: Diagnosis not present

## 2023-01-04 DIAGNOSIS — E1122 Type 2 diabetes mellitus with diabetic chronic kidney disease: Secondary | ICD-10-CM | POA: Diagnosis not present

## 2023-01-04 DIAGNOSIS — I272 Pulmonary hypertension, unspecified: Secondary | ICD-10-CM | POA: Diagnosis not present

## 2023-01-04 DIAGNOSIS — N1831 Chronic kidney disease, stage 3a: Secondary | ICD-10-CM | POA: Diagnosis not present

## 2023-01-04 DIAGNOSIS — I480 Paroxysmal atrial fibrillation: Secondary | ICD-10-CM | POA: Diagnosis not present

## 2023-01-19 ENCOUNTER — Encounter: Payer: Self-pay | Admitting: Cardiovascular Disease

## 2023-02-13 DIAGNOSIS — R069 Unspecified abnormalities of breathing: Secondary | ICD-10-CM | POA: Diagnosis not present

## 2023-02-13 DIAGNOSIS — I499 Cardiac arrhythmia, unspecified: Secondary | ICD-10-CM | POA: Diagnosis not present

## 2023-02-13 DIAGNOSIS — I469 Cardiac arrest, cause unspecified: Secondary | ICD-10-CM | POA: Diagnosis not present

## 2023-02-13 DIAGNOSIS — R0689 Other abnormalities of breathing: Secondary | ICD-10-CM | POA: Diagnosis not present

## 2023-02-23 DEATH — deceased
